# Patient Record
Sex: Male | Born: 1945 | Race: White | Hispanic: No | Marital: Married | Smoking: Former smoker
Health system: Southern US, Community
[De-identification: ages and names within clinical notes are randomized; demographics above are authoritative.]

## PROBLEM LIST (undated history)

## (undated) DIAGNOSIS — C3490 Malignant neoplasm of unspecified part of unspecified bronchus or lung: Secondary | ICD-10-CM

## (undated) DIAGNOSIS — Z923 Personal history of irradiation: Secondary | ICD-10-CM

## (undated) DIAGNOSIS — R7303 Prediabetes: Secondary | ICD-10-CM

## (undated) DIAGNOSIS — I10 Essential (primary) hypertension: Secondary | ICD-10-CM

## (undated) DIAGNOSIS — M1990 Unspecified osteoarthritis, unspecified site: Secondary | ICD-10-CM

## (undated) DIAGNOSIS — IMO0002 Reserved for concepts with insufficient information to code with codable children: Secondary | ICD-10-CM

## (undated) DIAGNOSIS — K219 Gastro-esophageal reflux disease without esophagitis: Secondary | ICD-10-CM

## (undated) DIAGNOSIS — M84350A Stress fracture, pelvis, initial encounter for fracture: Secondary | ICD-10-CM

## (undated) DIAGNOSIS — M549 Dorsalgia, unspecified: Secondary | ICD-10-CM

## (undated) DIAGNOSIS — G8929 Other chronic pain: Secondary | ICD-10-CM

## (undated) DIAGNOSIS — K8020 Calculus of gallbladder without cholecystitis without obstruction: Secondary | ICD-10-CM

## (undated) DIAGNOSIS — F419 Anxiety disorder, unspecified: Secondary | ICD-10-CM

## (undated) DIAGNOSIS — D649 Anemia, unspecified: Secondary | ICD-10-CM

## (undated) DIAGNOSIS — T7840XA Allergy, unspecified, initial encounter: Secondary | ICD-10-CM

## (undated) DIAGNOSIS — E7800 Pure hypercholesterolemia, unspecified: Secondary | ICD-10-CM

## (undated) DIAGNOSIS — Z87442 Personal history of urinary calculi: Secondary | ICD-10-CM

## (undated) DIAGNOSIS — R931 Abnormal findings on diagnostic imaging of heart and coronary circulation: Secondary | ICD-10-CM

## (undated) DIAGNOSIS — J439 Emphysema, unspecified: Secondary | ICD-10-CM

## (undated) HISTORY — DX: Emphysema, unspecified: J43.9

## (undated) HISTORY — DX: Malignant neoplasm of unspecified part of unspecified bronchus or lung: C34.90

## (undated) HISTORY — DX: Dorsalgia, unspecified: M54.9

## (undated) HISTORY — DX: Stress fracture, pelvis, initial encounter for fracture: M84.350A

## (undated) HISTORY — DX: Other chronic pain: G89.29

## (undated) HISTORY — DX: Personal history of irradiation: Z92.3

## (undated) HISTORY — DX: Pure hypercholesterolemia, unspecified: E78.00

## (undated) HISTORY — DX: Essential (primary) hypertension: I10

## (undated) HISTORY — DX: Reserved for concepts with insufficient information to code with codable children: IMO0002

## (undated) HISTORY — DX: Abnormal findings on diagnostic imaging of heart and coronary circulation: R93.1

---

## 2001-04-03 ENCOUNTER — Encounter: Payer: Self-pay | Admitting: Emergency Medicine

## 2001-04-03 ENCOUNTER — Emergency Department (HOSPITAL_COMMUNITY): Admission: EM | Admit: 2001-04-03 | Discharge: 2001-04-03 | Payer: Self-pay | Admitting: Emergency Medicine

## 2006-09-29 ENCOUNTER — Ambulatory Visit (HOSPITAL_COMMUNITY): Admission: RE | Admit: 2006-09-29 | Discharge: 2006-09-29 | Payer: Self-pay | Admitting: General Surgery

## 2008-06-20 ENCOUNTER — Ambulatory Visit (HOSPITAL_COMMUNITY): Admission: RE | Admit: 2008-06-20 | Discharge: 2008-06-20 | Payer: Self-pay | Admitting: Family Medicine

## 2008-08-09 ENCOUNTER — Ambulatory Visit (HOSPITAL_COMMUNITY): Admission: RE | Admit: 2008-08-09 | Discharge: 2008-08-09 | Payer: Self-pay | Admitting: Family Medicine

## 2008-08-17 ENCOUNTER — Ambulatory Visit (HOSPITAL_COMMUNITY): Admission: RE | Admit: 2008-08-17 | Discharge: 2008-08-17 | Payer: Self-pay | Admitting: Family Medicine

## 2008-08-23 ENCOUNTER — Ambulatory Visit (HOSPITAL_COMMUNITY): Admission: RE | Admit: 2008-08-23 | Discharge: 2008-08-23 | Payer: Self-pay | Admitting: Family Medicine

## 2008-08-30 ENCOUNTER — Ambulatory Visit: Payer: Self-pay | Admitting: Thoracic Surgery

## 2008-09-27 ENCOUNTER — Inpatient Hospital Stay (HOSPITAL_COMMUNITY): Admission: RE | Admit: 2008-09-27 | Discharge: 2008-10-03 | Payer: Self-pay | Admitting: Neurosurgery

## 2009-05-09 ENCOUNTER — Ambulatory Visit: Payer: Self-pay | Admitting: Thoracic Surgery

## 2009-05-09 ENCOUNTER — Encounter: Admission: RE | Admit: 2009-05-09 | Discharge: 2009-05-09 | Payer: Self-pay | Admitting: Thoracic Surgery

## 2009-09-08 HISTORY — PX: BACK SURGERY: SHX140

## 2009-11-14 ENCOUNTER — Encounter: Admission: RE | Admit: 2009-11-14 | Discharge: 2009-11-14 | Payer: Self-pay | Admitting: Thoracic Surgery

## 2009-11-14 ENCOUNTER — Ambulatory Visit: Payer: Self-pay | Admitting: Thoracic Surgery

## 2010-03-01 ENCOUNTER — Encounter: Admission: RE | Admit: 2010-03-01 | Discharge: 2010-03-01 | Payer: Self-pay | Admitting: Family Medicine

## 2010-09-08 HISTORY — PX: LUNG LOBECTOMY: SHX167

## 2010-09-29 ENCOUNTER — Encounter: Payer: Self-pay | Admitting: Thoracic Surgery

## 2010-09-29 ENCOUNTER — Encounter: Payer: Self-pay | Admitting: Family Medicine

## 2010-09-30 ENCOUNTER — Ambulatory Visit (HOSPITAL_COMMUNITY)
Admission: RE | Admit: 2010-09-30 | Discharge: 2010-09-30 | Payer: Self-pay | Source: Home / Self Care | Attending: Internal Medicine | Admitting: Internal Medicine

## 2010-09-30 ENCOUNTER — Ambulatory Visit (HOSPITAL_COMMUNITY)
Admission: RE | Admit: 2010-09-30 | Discharge: 2010-09-30 | Payer: Self-pay | Source: Home / Self Care | Attending: Family Medicine | Admitting: Family Medicine

## 2010-10-01 LAB — CREATININE, SERUM

## 2010-10-08 ENCOUNTER — Ambulatory Visit
Admission: RE | Admit: 2010-10-08 | Discharge: 2010-10-08 | Payer: Self-pay | Source: Home / Self Care | Attending: Thoracic Surgery | Admitting: Thoracic Surgery

## 2010-10-09 NOTE — Letter (Signed)
October 08, 2010  [name]. Sherwood Gambler, MD [address] [place], [place] [address]  Re:  Maurice Orozco, Maurice Orozco                 DOB:  07/05/1946  Dear Dr. Sherwood Gambler:  I saw the patient back today and unfortunately he did not come back as we scheduled for him a CT scan, but he got a CT scan recently that showed this lesion increased to 11 x 11 x 12 mm in length from the previous 7 x 7.  The PET scan showed the uptake value was 1.7, so this is probably a slow-growing cancer.  He is still smoking and I cautioned him to not smoke.  I think the best thing to do is go ahead and resect this and he will require a right upper lobectomy.  I will check pulmonary function tests on him prior to proceeding with the resection. We plan to do this on October 15, 2010, at [facility].  Sincerely,  Ines Bloomer, M.D. Electronically Signed  DPB/MEDQ  D:  10/08/2010  T:  10/09/2010  Job:  [id number]

## 2010-10-10 ENCOUNTER — Ambulatory Visit (HOSPITAL_COMMUNITY)
Admission: RE | Admit: 2010-10-10 | Discharge: 2010-10-10 | Disposition: A | Discharge status: Home / Self Care | Payer: BC Managed Care – PPO | Source: Ambulatory Visit | Attending: Thoracic Surgery | Admitting: Thoracic Surgery

## 2010-10-10 DIAGNOSIS — J984 Other disorders of lung: Secondary | ICD-10-CM | POA: Insufficient documentation

## 2010-10-11 ENCOUNTER — Encounter (HOSPITAL_COMMUNITY): Payer: BC Managed Care – PPO

## 2010-10-11 ENCOUNTER — Other Ambulatory Visit: Payer: Self-pay | Admitting: Thoracic Surgery

## 2010-10-11 ENCOUNTER — Ambulatory Visit (HOSPITAL_COMMUNITY)
Admission: RE | Admit: 2010-10-11 | Discharge: 2010-10-11 | Disposition: A | Discharge status: Home / Self Care | Payer: BC Managed Care – PPO | Source: Ambulatory Visit | Attending: Thoracic Surgery | Admitting: Thoracic Surgery

## 2010-10-11 DIAGNOSIS — J4489 Other specified chronic obstructive pulmonary disease: Secondary | ICD-10-CM | POA: Insufficient documentation

## 2010-10-11 DIAGNOSIS — Z01818 Encounter for other preprocedural examination: Secondary | ICD-10-CM | POA: Insufficient documentation

## 2010-10-11 DIAGNOSIS — R911 Solitary pulmonary nodule: Secondary | ICD-10-CM

## 2010-10-11 DIAGNOSIS — J984 Other disorders of lung: Secondary | ICD-10-CM | POA: Insufficient documentation

## 2010-10-11 DIAGNOSIS — J449 Chronic obstructive pulmonary disease, unspecified: Secondary | ICD-10-CM | POA: Insufficient documentation

## 2010-10-11 LAB — COMPREHENSIVE METABOLIC PANEL
AST: 18 U/L (ref 0–37)
Albumin: 4 g/dL (ref 3.5–5.2)
Alkaline Phosphatase: 62 U/L (ref 39–117)
Chloride: 99 mEq/L (ref 96–112)
Creatinine, Ser: 0.79 mg/dL (ref 0.4–1.5)
Potassium: 4.6 mEq/L (ref 3.5–5.1)
Total Bilirubin: 0.3 mg/dL (ref 0.3–1.2)
Total Protein: 7 g/dL (ref 6.0–8.3)

## 2010-10-11 LAB — CBC
HCT: 43.4 % (ref 39.0–52.0)
Hemoglobin: 14.8 g/dL (ref 13.0–17.0)
WBC: 7.9 10*3/uL (ref 4.0–10.5)

## 2010-10-11 LAB — PROTIME-INR
INR: 0.93 (ref 0.00–1.49)
Prothrombin Time: 12.7 seconds (ref 11.6–15.2)

## 2010-10-11 LAB — APTT

## 2010-10-15 ENCOUNTER — Inpatient Hospital Stay (HOSPITAL_COMMUNITY): Payer: BC Managed Care – PPO

## 2010-10-15 ENCOUNTER — Inpatient Hospital Stay (HOSPITAL_COMMUNITY)
Admission: RE | Admit: 2010-10-15 | Discharge: 2010-10-25 | DRG: 538 | Disposition: A | Discharge status: Home / Self Care | Payer: BC Managed Care – PPO | Source: Ambulatory Visit | Attending: Thoracic Surgery | Admitting: Thoracic Surgery

## 2010-10-15 ENCOUNTER — Other Ambulatory Visit: Payer: Self-pay | Admitting: Thoracic Surgery

## 2010-10-15 DIAGNOSIS — J4489 Other specified chronic obstructive pulmonary disease: Secondary | ICD-10-CM | POA: Diagnosis present

## 2010-10-15 DIAGNOSIS — C3410 Malignant neoplasm of upper lobe, unspecified bronchus or lung: Secondary | ICD-10-CM

## 2010-10-15 DIAGNOSIS — F17200 Nicotine dependence, unspecified, uncomplicated: Secondary | ICD-10-CM | POA: Diagnosis present

## 2010-10-15 DIAGNOSIS — D72829 Elevated white blood cell count, unspecified: Secondary | ICD-10-CM | POA: Diagnosis present

## 2010-10-15 DIAGNOSIS — J988 Other specified respiratory disorders: Secondary | ICD-10-CM | POA: Diagnosis not present

## 2010-10-15 DIAGNOSIS — Y9210 Unspecified residential institution as the place of occurrence of the external cause: Secondary | ICD-10-CM | POA: Diagnosis not present

## 2010-10-15 DIAGNOSIS — G8929 Other chronic pain: Secondary | ICD-10-CM | POA: Diagnosis present

## 2010-10-15 DIAGNOSIS — E7800 Pure hypercholesterolemia, unspecified: Secondary | ICD-10-CM | POA: Diagnosis present

## 2010-10-15 DIAGNOSIS — Y836 Removal of other organ (partial) (total) as the cause of abnormal reaction of the patient, or of later complication, without mention of misadventure at the time of the procedure: Secondary | ICD-10-CM | POA: Diagnosis not present

## 2010-10-15 DIAGNOSIS — J9382 Other air leak: Secondary | ICD-10-CM | POA: Diagnosis not present

## 2010-10-15 DIAGNOSIS — M51379 Other intervertebral disc degeneration, lumbosacral region without mention of lumbar back pain or lower extremity pain: Secondary | ICD-10-CM | POA: Diagnosis present

## 2010-10-15 DIAGNOSIS — M5137 Other intervertebral disc degeneration, lumbosacral region: Secondary | ICD-10-CM | POA: Diagnosis present

## 2010-10-15 DIAGNOSIS — J449 Chronic obstructive pulmonary disease, unspecified: Secondary | ICD-10-CM | POA: Diagnosis present

## 2010-10-15 DIAGNOSIS — IMO0002 Reserved for concepts with insufficient information to code with codable children: Secondary | ICD-10-CM | POA: Diagnosis not present

## 2010-10-15 DIAGNOSIS — J13 Pneumonia due to Streptococcus pneumoniae: Secondary | ICD-10-CM | POA: Diagnosis not present

## 2010-10-15 DIAGNOSIS — J9819 Other pulmonary collapse: Secondary | ICD-10-CM | POA: Diagnosis not present

## 2010-10-15 DIAGNOSIS — Y838 Other surgical procedures as the cause of abnormal reaction of the patient, or of later complication, without mention of misadventure at the time of the procedure: Secondary | ICD-10-CM | POA: Diagnosis not present

## 2010-10-15 DIAGNOSIS — I10 Essential (primary) hypertension: Secondary | ICD-10-CM | POA: Diagnosis present

## 2010-10-15 LAB — BLOOD GAS, ARTERIAL
Bicarbonate: 24.8 mEq/L — ABNORMAL HIGH (ref 20.0–24.0)
Patient temperature: 98.6
TCO2: 26 mmol/L (ref 0–100)
pH, Arterial: 7.417 (ref 7.350–7.450)
pO2, Arterial: 80.7 mmHg (ref 80.0–100.0)

## 2010-10-15 LAB — URINALYSIS, ROUTINE W REFLEX MICROSCOPIC
Hgb urine dipstick: NEGATIVE
Nitrite: NEGATIVE
Protein, ur: NEGATIVE mg/dL
Specific Gravity, Urine: 1.023 (ref 1.005–1.030)
Urobilinogen, UA: 0.2 mg/dL (ref 0.0–1.0)

## 2010-10-15 LAB — SURGICAL PCR SCREEN: MRSA, PCR: NEGATIVE

## 2010-10-16 ENCOUNTER — Inpatient Hospital Stay (HOSPITAL_COMMUNITY): Payer: BC Managed Care – PPO

## 2010-10-16 DIAGNOSIS — J441 Chronic obstructive pulmonary disease with (acute) exacerbation: Secondary | ICD-10-CM

## 2010-10-16 DIAGNOSIS — Z9889 Other specified postprocedural states: Secondary | ICD-10-CM

## 2010-10-16 DIAGNOSIS — J9383 Other pneumothorax: Secondary | ICD-10-CM

## 2010-10-16 DIAGNOSIS — J9819 Other pulmonary collapse: Secondary | ICD-10-CM

## 2010-10-16 LAB — CBC
MCV: 98.4 fL (ref 78.0–100.0)
Platelets: 276 10*3/uL (ref 150–400)
RBC: 3.84 MIL/uL — ABNORMAL LOW (ref 4.22–5.81)
RDW: 13.3 % (ref 11.5–15.5)
WBC: 16.2 10*3/uL — ABNORMAL HIGH (ref 4.0–10.5)

## 2010-10-16 LAB — BASIC METABOLIC PANEL
CO2: 26 mEq/L (ref 19–32)
Chloride: 99 mEq/L (ref 96–112)
Creatinine, Ser: 0.6 mg/dL (ref 0.4–1.5)
Sodium: 134 mEq/L — ABNORMAL LOW (ref 135–145)

## 2010-10-16 LAB — POCT I-STAT 3, ART BLOOD GAS (G3+)
Patient temperature: 98.9
TCO2: 29 mmol/L (ref 0–100)
pCO2 arterial: 43.5 mmHg (ref 35.0–45.0)
pH, Arterial: 7.42 (ref 7.350–7.450)

## 2010-10-17 ENCOUNTER — Inpatient Hospital Stay (HOSPITAL_COMMUNITY): Payer: BC Managed Care – PPO

## 2010-10-17 DIAGNOSIS — J9382 Other air leak: Secondary | ICD-10-CM

## 2010-10-17 LAB — CBC
HCT: 35.5 % — ABNORMAL LOW (ref 39.0–52.0)
Hemoglobin: 12.4 g/dL — ABNORMAL LOW (ref 13.0–17.0)
MCH: 33.8 pg (ref 26.0–34.0)
MCHC: 34.9 g/dL (ref 30.0–36.0)
RDW: 13.1 % (ref 11.5–15.5)

## 2010-10-17 LAB — COMPREHENSIVE METABOLIC PANEL
BUN: 4 mg/dL — ABNORMAL LOW (ref 6–23)
CO2: 27 mEq/L (ref 19–32)
Calcium: 8.8 mg/dL (ref 8.4–10.5)
Creatinine, Ser: 0.6 mg/dL (ref 0.4–1.5)
GFR calc non Af Amer: >60 mL/min (ref >60)
Glucose, Bld: 136 mg/dL — ABNORMAL HIGH (ref 70–99)

## 2010-10-17 LAB — GLUCOSE, CAPILLARY
Glucose-Capillary: 115 mg/dL — ABNORMAL HIGH (ref 70–99)
Glucose-Capillary: 163 mg/dL — ABNORMAL HIGH (ref 70–99)
Glucose-Capillary: 130 mg/dL — ABNORMAL HIGH (ref 70–99)

## 2010-10-18 ENCOUNTER — Inpatient Hospital Stay (HOSPITAL_COMMUNITY): Payer: BC Managed Care – PPO

## 2010-10-18 LAB — TYPE AND SCREEN
ABO/RH(D): A NEG
Unit division: 0
Unit division: 0

## 2010-10-18 LAB — POCT I-STAT 3, ART BLOOD GAS (G3+)
Acid-Base Excess: 1 mmol/L (ref 0.0–2.0)
O2 Saturation: 95 %

## 2010-10-18 LAB — CBC
MCH: 33 pg (ref 26.0–34.0)
MCHC: 34 g/dL (ref 30.0–36.0)
Platelets: 244 10*3/uL (ref 150–400)

## 2010-10-18 LAB — GLUCOSE, CAPILLARY
Glucose-Capillary: 130 mg/dL — ABNORMAL HIGH (ref 70–99)
Glucose-Capillary: 157 mg/dL — ABNORMAL HIGH (ref 70–99)

## 2010-10-18 LAB — BASIC METABOLIC PANEL
Calcium: 8.1 mg/dL — ABNORMAL LOW (ref 8.4–10.5)
Creatinine, Ser: 0.59 mg/dL (ref 0.4–1.5)
GFR calc Af Amer: >60 mL/min (ref >60)

## 2010-10-19 ENCOUNTER — Inpatient Hospital Stay (HOSPITAL_COMMUNITY): Payer: BC Managed Care – PPO

## 2010-10-19 LAB — COMPREHENSIVE METABOLIC PANEL
ALT: 37 U/L (ref 0–53)
AST: 44 U/L — ABNORMAL HIGH (ref 0–37)
Albumin: 2.3 g/dL — ABNORMAL LOW (ref 3.5–5.2)
CO2: 28 mEq/L (ref 19–32)
Chloride: 94 mEq/L — ABNORMAL LOW (ref 96–112)
GFR calc Af Amer: >60 mL/min (ref >60)
GFR calc non Af Amer: >60 mL/min (ref >60)
Potassium: 3.6 mEq/L (ref 3.5–5.1)
Sodium: 131 mEq/L — ABNORMAL LOW (ref 135–145)
Total Bilirubin: 1 mg/dL (ref 0.3–1.2)

## 2010-10-19 LAB — CBC
Hemoglobin: 11.2 g/dL — ABNORMAL LOW (ref 13.0–17.0)
RBC: 3.35 MIL/uL — ABNORMAL LOW (ref 4.22–5.81)
WBC: 19 10*3/uL — ABNORMAL HIGH (ref 4.0–10.5)

## 2010-10-19 LAB — GLUCOSE, CAPILLARY: Glucose-Capillary: 114 mg/dL — ABNORMAL HIGH (ref 70–99)

## 2010-10-20 ENCOUNTER — Inpatient Hospital Stay (HOSPITAL_COMMUNITY): Payer: BC Managed Care – PPO

## 2010-10-20 LAB — BASIC METABOLIC PANEL
CO2: 28 mEq/L (ref 19–32)
Calcium: 8.4 mg/dL (ref 8.4–10.5)
Creatinine, Ser: 0.52 mg/dL (ref 0.4–1.5)
Glucose, Bld: 111 mg/dL — ABNORMAL HIGH (ref 70–99)
Sodium: 132 mEq/L — ABNORMAL LOW (ref 135–145)

## 2010-10-20 LAB — URINE CULTURE
Colony Count: NO GROWTH
Culture: NO GROWTH

## 2010-10-20 LAB — TISSUE CULTURE: Gram Stain: NONE SEEN

## 2010-10-20 LAB — CBC
HCT: 31.6 % — ABNORMAL LOW (ref 39.0–52.0)
Hemoglobin: 10.9 g/dL — ABNORMAL LOW (ref 13.0–17.0)
MCH: 32.8 pg (ref 26.0–34.0)
MCHC: 34.5 g/dL (ref 30.0–36.0)

## 2010-10-20 LAB — GLUCOSE, CAPILLARY

## 2010-10-21 ENCOUNTER — Inpatient Hospital Stay (HOSPITAL_COMMUNITY): Payer: BC Managed Care – PPO

## 2010-10-21 DIAGNOSIS — J13 Pneumonia due to Streptococcus pneumoniae: Secondary | ICD-10-CM

## 2010-10-21 DIAGNOSIS — C3410 Malignant neoplasm of upper lobe, unspecified bronchus or lung: Secondary | ICD-10-CM

## 2010-10-21 LAB — CBC
HCT: 32.2 % — ABNORMAL LOW (ref 39.0–52.0)
Hemoglobin: 11.3 g/dL — ABNORMAL LOW (ref 13.0–17.0)
MCH: 33.6 pg (ref 26.0–34.0)
MCHC: 35.1 g/dL (ref 30.0–36.0)
MCV: 95.8 fL (ref 78.0–100.0)
Platelets: 380 10*3/uL (ref 150–400)
RBC: 3.36 MIL/uL — ABNORMAL LOW (ref 4.22–5.81)
RDW: 13.2 % (ref 11.5–15.5)
WBC: 16.7 10*3/uL — ABNORMAL HIGH (ref 4.0–10.5)

## 2010-10-21 LAB — CULTURE, RESPIRATORY W GRAM STAIN

## 2010-10-21 LAB — BASIC METABOLIC PANEL
CO2: 29 mEq/L (ref 19–32)
Calcium: 8.3 mg/dL — ABNORMAL LOW (ref 8.4–10.5)
Chloride: 94 mEq/L — ABNORMAL LOW (ref 96–112)
Glucose, Bld: 127 mg/dL — ABNORMAL HIGH (ref 70–99)
Potassium: 3.7 mEq/L (ref 3.5–5.1)
Sodium: 133 mEq/L — ABNORMAL LOW (ref 135–145)

## 2010-10-21 LAB — GLUCOSE, CAPILLARY
Glucose-Capillary: 119 mg/dL — ABNORMAL HIGH (ref 70–99)
Glucose-Capillary: 152 mg/dL — ABNORMAL HIGH (ref 70–99)
Glucose-Capillary: 116 mg/dL — ABNORMAL HIGH (ref 70–99)

## 2010-10-22 ENCOUNTER — Inpatient Hospital Stay (HOSPITAL_COMMUNITY): Payer: BC Managed Care – PPO

## 2010-10-22 LAB — GLUCOSE, CAPILLARY
Glucose-Capillary: 111 mg/dL — ABNORMAL HIGH (ref 70–99)
Glucose-Capillary: 123 mg/dL — ABNORMAL HIGH (ref 70–99)
Glucose-Capillary: 108 mg/dL — ABNORMAL HIGH (ref 70–99)
Glucose-Capillary: 116 mg/dL — ABNORMAL HIGH (ref 70–99)
Glucose-Capillary: 114 mg/dL — ABNORMAL HIGH (ref 70–99)

## 2010-10-22 LAB — COMPREHENSIVE METABOLIC PANEL
AST: 38 U/L — ABNORMAL HIGH (ref 0–37)
Albumin: 2.3 g/dL — ABNORMAL LOW (ref 3.5–5.2)
BUN: 11 mg/dL (ref 6–23)
Calcium: 8.3 mg/dL — ABNORMAL LOW (ref 8.4–10.5)
Creatinine, Ser: 0.56 mg/dL (ref 0.4–1.5)
GFR calc Af Amer: >60 mL/min (ref >60)
GFR calc non Af Amer: >60 mL/min (ref >60)

## 2010-10-22 LAB — CBC
MCH: 32.6 pg (ref 26.0–34.0)
MCHC: 34.1 g/dL (ref 30.0–36.0)
RDW: 13.5 % (ref 11.5–15.5)

## 2010-10-22 LAB — AMYLASE: Amylase: 24 U/L (ref 0–105)

## 2010-10-22 LAB — MRSA PCR SCREENING: MRSA by PCR: NEGATIVE

## 2010-10-23 ENCOUNTER — Inpatient Hospital Stay (HOSPITAL_COMMUNITY): Payer: BC Managed Care – PPO

## 2010-10-23 LAB — GLUCOSE, CAPILLARY
Glucose-Capillary: 121 mg/dL — ABNORMAL HIGH (ref 70–99)
Glucose-Capillary: 115 mg/dL — ABNORMAL HIGH (ref 70–99)

## 2010-10-23 LAB — CBC
MCH: 32.7 pg (ref 26.0–34.0)
MCHC: 33.7 g/dL (ref 30.0–36.0)
Platelets: 476 10*3/uL — ABNORMAL HIGH (ref 150–400)

## 2010-10-24 ENCOUNTER — Inpatient Hospital Stay (HOSPITAL_COMMUNITY): Payer: BC Managed Care – PPO

## 2010-10-24 LAB — GLUCOSE, CAPILLARY
Glucose-Capillary: 112 mg/dL — ABNORMAL HIGH (ref 70–99)
Glucose-Capillary: 115 mg/dL — ABNORMAL HIGH (ref 70–99)

## 2010-10-24 LAB — COMPREHENSIVE METABOLIC PANEL
ALT: 58 U/L — ABNORMAL HIGH (ref 0–53)
AST: 28 U/L (ref 0–37)
Albumin: 2.3 g/dL — ABNORMAL LOW (ref 3.5–5.2)
Calcium: 8.4 mg/dL (ref 8.4–10.5)
GFR calc Af Amer: >60 mL/min (ref >60)
Glucose, Bld: 104 mg/dL — ABNORMAL HIGH (ref 70–99)
Sodium: 134 mEq/L — ABNORMAL LOW (ref 135–145)
Total Protein: 5.1 g/dL — ABNORMAL LOW (ref 6.0–8.3)

## 2010-10-24 LAB — CBC
MCHC: 34 g/dL (ref 30.0–36.0)
Platelets: 502 10*3/uL — ABNORMAL HIGH (ref 150–400)
RDW: 13.9 % (ref 11.5–15.5)
WBC: 13.1 10*3/uL — ABNORMAL HIGH (ref 4.0–10.5)

## 2010-10-24 NOTE — Op Note (Signed)
NAMEJENNIE, BOLAR                 ACCOUNT NO.:  [id number]  MEDICAL RECORD NO.:  [id number]           PATIENT TYPE:  I  LOCATION:  2305                         FACILITY:  [facility]  PHYSICIAN:  Ines Bloomer, M.D. DATE OF BIRTH:  03/09/1946  DATE OF PROCEDURE:  10/15/2010 DATE OF DISCHARGE:                              OPERATIVE REPORT   PREOPERATIVE DIAGNOSIS:  Right upper lobe nodule.  POSTOPERATIVE DIAGNOSIS:  Non-small cell cancer, right upper lobe.  OPERATION PERFORMED:  Right video-assisted thoracoscopic surgery, right thoracotomy, right upper lobectomy, and node dissection.  SURGEON:  Ines Bloomer, MD  INDICATIONS:  This patient had a right upper lobe nodule that has slowly increased in size.  The PET scan had been low uptake done several months ago.  Because of the increase in size, he was brought to the operating room for resection.  It was in the middle of the right upper lobe and a needle biopsy was held which was also because of the patient's chronic obstructive pulmonary disease.  He had an FEV-1 of 1.47 and his FVC was greater than 2.  DESCRIPTION OF PROCEDURE:  After general anesthesia, he was turned into the right lateral thoracotomy position.  He was prepped and draped in the usual sterile manner.  Two trocar sites were made in the anterior and posterior axillary line at the seventh intercostal space.  Two trocars were inserted.  His lungs were hyperinflated, but no lesions could be seen on the lung.  We decided to go with the posterolateral thoracotomy dividing the latissimus, reflecting the serratus anteriorly, and once we were in there, we entered the fifth intercostal space.  A tube was placed in the interspace and we were able to palpate the lesion in the middle of the right upper lobe.  I had no option but to go and do a right upper lobectomy.  Dissection was started anteriorly dissecting out the superior pulmonary vein which was stapled and divided  with a Covidien stapler.  Then we dissected out several 10R nodes and stapled and divided with a Covidien and the apical posterior branch.  I returned inferiorly and first took down the inferior pulmonary ligament.  We then dissected out the posterior mediastinum and finally we were able to get down to the superior segment and then decided to go into the fissure. We went into the fissure and dissected down to the pulmonary artery and identified some lymph nodes, some were 11R lymph nodes which were dissected out.  We then divided the superior portion of the fissure with a Covidien stapler.  Attention was then turned to the minor fissure which we also divided with Covidien stapler and that the ascending branch was clipped with clips and ligated proximally with 0-silk and divided.  Finally, we stapled the bronchus with the TA-30 stapler and divided distally and frozen section has negative bronchial margins and it is non-small cell lung cancer.  Single On-Q was inserted in the usual fashion.  A #28 and #24 chest tube was inserted to the trocar sites.  A Marcaine block was done in the usual fashion.  A chest was closed in layers drilling through the sixth rib and passed around the fifth rib, #1 Vicryl in the muscle layer, 2-0 Vicryl in the subcutaneous tissue, and Dermabond for the skin.  He returned to the recovery room in stable condition.     Ines Bloomer, M.D.     DPB/MEDQ  D:  10/15/2010  T:  10/15/2010  Job:  [id number]  Electronically Signed by Jovita Gamma M.D. on 10/24/2010 03:40:47 PM

## 2010-10-24 NOTE — Op Note (Signed)
  [name], Maurice Orozco                 ACCOUNT NO.:  [id number]  MEDICAL RECORD NO.:  [id number]           PATIENT TYPE:  I  LOCATION:  2305                         FACILITY:  [facility]  PHYSICIAN:  Ines Bloomer, M.D. DATE OF BIRTH:  09/17/1945  DATE OF PROCEDURE: DATE OF DISCHARGE:                              OPERATIVE REPORT   PREOPERATIVE DIAGNOSIS:  Persistent air leak.  POSTOPERATIVE DIAGNOSIS:  Persistent air leak.  OPERATION PERFORMED:  Right thoracotomy and exploration.  This patient had a right upper lobectomy 2 days previously and in the recovery room he had developed a larger leak, but this had resolved over 24-48 hours until last night when he became confused and tried to pull his tubes out, and the air leak then developed to 7/7.  His chest x-ray showed that his lung was down and he was put back on suction.  We were able to re-expand his lung, but his leak continued at 4-5/7.  He was brought to the operating room for possible closure of the air leak, underwent general anesthesia, was turned to the right lateral thoracotomy position, was prepped and draped in usual sterile manner. Previous thoracotomy incision was opened, the sutures were removed.  We put a TPA and explored the lung after we had removed the chest tubes. There was no obvious area of tear in the lungs.  The lungs did hyper- expand as you would see from emphysema.  He also had a lot of secretions and so cultures were taken of his secretions.  He had been put on Maxipime and vancomycin because of an elevated white count.  We looked at the inferior pulmonary ligament, the right lower lobe, the fissure of the right middle lobe.  Right across near the staple line there was some area of irregularity there.  We decided to rather than putting stitches in this area we expanded lung under 40 cm of pressure under water, and all we could see was just a small-to-minimal air leak that was coming probably from the  fissure.  For this reason, rather than putting more sutures in his emphysematous lungs, we elected to try to seal this with CoSeal and then placed two chest tubes through the old trocar sites, and closed the chest with 4 pericostals, #1 Vicryl in the muscle layer, 2-0 Vicryl in the subcutaneous tissue, and Ethicon skin clips.  We then connected him to a Pleur-evac, and he still had a small air leak from 1- 3 at 20 cm of dissection.  We elected that was satisfactory.  He was taken back to the recovery room in serious condition.     Ines Bloomer, M.D.     DPB/MEDQ  D:  10/17/2010  T:  10/18/2010  Job:  [id number]  Electronically Signed by Jovita Gamma M.D. on 10/24/2010 03:44:49 PM

## 2010-10-24 NOTE — H&P (Signed)
  Maurice Orozco, Maurice Orozco                 ACCOUNT NO.:  [id number]  MEDICAL RECORD NO.:  [id number]           PATIENT TYPE:  LOCATION:                                 FACILITY:  PHYSICIAN:  Ines Bloomer, M.D. DATE OF BIRTH:  1946/05/19  DATE OF ADMISSION: DATE OF DISCHARGE:                             HISTORY & PHYSICAL   CHIEF COMPLAINT:  Right upper lobe nodule.  This patient has been followed for right upper lobe nodule who was last seen in March 2011 and because he was having some problems with his back did not come back for followup and then was sent back today and a CT scan done showed that the lesion has gone from 7.7 to 11 x 12 mm.  Previous uptake was 1.7, and this goes along with the a slow growing lesion.He can walked up several flights of stairs without any problems.  Had no hemoptysis, fever, chills or productive sputum.  MEDICATIONS:  Percocet 10-325, Neurontin, Vytorin, hydrochlorothiazide, meloxicam, Coreg, fluticasone He has chronic back pain as well as hypertension, hypercholesterolemia.  FAMILY HISTORY:  Noncontributory.  SOCIAL HISTORY:  He is married.  He smokes 1 pack per day. Does not take alcohol on regular basis.  REVIEW OF SYSTEMS: General:Weight is stable.  PULMONARY:  No hemoptysis.  See history of present illness.  GI:  No nausea, vomiting, constipation, or diarrhea.  GU:  No kidney disease dysuria, or frequency urination. VASCULAR:  No claudication, DVT, or TIA.  NEUROLOGIC:  No dizziness, headaches, blackout, or seizures.  MUSCULOSKELETAL:  No arthritis or joint pain.  PSYCHIATRIC:  No depression or nervousness.  EYE/ENT:  No changes in eyesight or hearing.  HEMATOLOGIC:  No problems with bleeding, clotting disorders, or anemia.  PHYSICAL EXAMINATION:  GENERAL:  He is a well-developed Caucasian male no acute distress. VITAL SIGNS:  Blood pressure 140/80, pulse 82, respirations 18, and sats 96%. HEAD:  Atraumatic. EYES:  Pupils are equal and  reactive to light and accommodation. Extraocular movements are normal. EARS:  Tympanic membranes were intact. NOSE:  There is no nasal septum deviation. THROAT:  Uvula is midline with no lesions. NECK:  Supple without thyromegaly.  There is supraclavicular or axillary adenopathy. CHEST:  Clear to auscultation and percussion. HEART:  Regular.  Sinus rhythm.  No murmurs. ABDOMEN:  Soft. EXTREMITIES:  Pulses are 2+.  There is no clubbing or edema. NEUROLOGIC:  He is oriented x3.  Sensory and motor intact.  Cranial nerves intact.  IMPRESSION: 1. Enlarging right upper lobe lesion. 2. Ongoing tobacco abuse. 3. Chronic back pain with narcotic use. 4. Hypertension. 5. Hyperlipidemia.  PLAN:  Right upper lobectomy.     Ines Bloomer, M.D.     DPB/MEDQ  D:  10/10/2010  T:  10/11/2010  Job:  [id number]  Electronically Signed by Jovita Gamma M.D. on 10/24/2010 03:44:45 PM

## 2010-10-25 ENCOUNTER — Inpatient Hospital Stay (HOSPITAL_COMMUNITY): Payer: BC Managed Care – PPO

## 2010-10-25 LAB — GLUCOSE, CAPILLARY

## 2010-10-29 ENCOUNTER — Ambulatory Visit (INDEPENDENT_AMBULATORY_CARE_PROVIDER_SITE_OTHER): Payer: Self-pay | Admitting: Thoracic Surgery

## 2010-10-29 ENCOUNTER — Other Ambulatory Visit: Payer: Self-pay | Admitting: Thoracic Surgery

## 2010-10-29 ENCOUNTER — Ambulatory Visit
Admission: RE | Admit: 2010-10-29 | Discharge: 2010-10-29 | Disposition: A | Discharge status: Home / Self Care | Payer: BC Managed Care – PPO | Source: Ambulatory Visit | Attending: Thoracic Surgery | Admitting: Thoracic Surgery

## 2010-10-29 DIAGNOSIS — C3410 Malignant neoplasm of upper lobe, unspecified bronchus or lung: Secondary | ICD-10-CM

## 2010-10-29 DIAGNOSIS — Z902 Acquired absence of lung [part of]: Secondary | ICD-10-CM

## 2010-10-30 NOTE — Assessment & Plan Note (Signed)
OFFICE VISIT  Maurice Orozco, Maurice Orozco DOB:  08/15/1946                                        October 29, 2010 CHART #:  [id number]  HISTORY:  The patient is a 65 year old white male who is status post right upper lobe lobectomy on October 15, 2010, for stage IA adenocarcinoma.  He requires re-exploration for a persistent air leak and that was done on October 17, 2010.  He was discharged in very stable condition on October 25, 2010.  He was seen on today's date and reports that he is feeling remarkably well.  He states that his appetite is excellent.  He is having no significant pulmonary symptoms.  In general, he is feeling much better than he felt as though he would.  Chest x-ray was obtained on today's date.  It reveals very stable appearance of the right chest with evidence of hydropneumothorax which is slowly improving.  PHYSICAL EXAMINATION:  Vital Signs:  Blood pressure is 137/82, pulse 82, respirations 20, oxygen saturation is 95% on room air.  General Appearance:  Well-developed adult male, in no abuse distress.  Pulmonary examination reveals clear breath sounds bilaterally.  Cardiac Examination:  Regular rate and rhythm.  Normal S1 and S2.  Incisions are healing well without evidence of infection and the chest tube sutures were removed on today's date without difficulty.  They are healing well also.  ASSESSMENT:  The patient is continuing to make steady progress following his surgery.  He will be referred to Dr. Mariel Sleet in [place] for Oncology opinion although it appears that he will not require any adjuvant therapies at this time.  We will see him again in the office in 3 weeks for another chest x-ray and p.r.n. if he has any interval changes.  We have instructed him that next week he can begin some driving.  He is encouraged to continue his pulmonary toilet.  He is encouraged to increase his ambulation as tolerated.  I did give him  a prescription for Ativan which he has been taking and has run out of it. I gave him 0.5 mg 1/2 tablet at bedtime p.r.n.  I did instruct him that he would have to get this next time from his primary physician, Dr. Loreta Ave.  Rowe Clack, P.A.-C.  Sherryll Burger  D:  10/29/2010  T:  10/30/2010  Job:  [id number]  cc:   Ines Bloomer, M.D. Oretha Milch, MD Ladona Horns. Mariel Sleet, MD

## 2010-11-06 NOTE — Consult Note (Signed)
Maurice Orozco, Maurice Orozco                 ACCOUNT NO.:  [id number]  MEDICAL RECORD NO.:  [id number]           PATIENT TYPE:  I  LOCATION:  2305                         FACILITY:  [facility]  PHYSICIAN:  Oretha Milch, MD      DATE OF BIRTH:  15-Oct-1945  DATE OF CONSULTATION:  10/16/2010 DATE OF DISCHARGE:                                CONSULTATION   REFERRING PHYSICIAN:  Ines Bloomer, MD  PRIMARY CARE PHYSICIAN:  Maurice Orozco. Sherwood Gambler, MD in [place].  REASON FOR CONSULTATION:  Postop lobectomy and COPD management.  HISTORY OF PRESENT ILLNESS:  Maurice Orozco is a 65 year old smoker with enlarging right upper lobe nodule measuring 11 x 11 x 12 mm, increased in size from the previous 7 x 7 cm with PET scan showing low-grade positivity with an SUV uptake of 1.7.  His preop lung function showed an FEV-1 of 1.47 and an FVC of greater than 2 L.  He underwent a right upper lobectomy.  Preop, he could walk 3-4 miles and used ProAir on an as-needed basis only.  He recently completed a 2-week course of Augmentin for sinusitis.  His postop chest x-ray from today shows volume loss and right hemothorax with rightward heart and mediastinum.  Two chest tubes in the right with small amount of pneumothorax and subcu emphysema along the right chest wall.  Right basilar airspace.  PAST MEDICAL HISTORY: 1. Hypertension. 2. Chronic back pain. 3. Hypercholesterolemia.  PAST SURGICAL HISTORY:  Back surgery by Dr. Jeral Fruit in January 2010.  CURRENT MEDICATIONS: 1. Fluticasone nasal spray. 2. ProAir 2 puffs b.i.d. p.r.n. 3. Alprazolam 0.5 mg b.i.d. 4. Hydrochlorothiazide 25 mg half tablet p.o. daily. 5. Simvastatin 20 mg p.o. daily. 6. Augmentin 875 mg p.o. b.i.d. completed few days ago. 7. Hydrocodone/acetaminophen q.4 h. as needed.  SOCIAL HISTORY:  He lives with his wife in [place].  Smokes about a pack per day.  Does not drink on a regular basis.  PHYSICAL EXAMINATION:  GENERAL:  Adult gentleman  sitting up in bed in no apparent respiratory distress. VITAL SIGNS:  Heart rate 78 per minute sinus on monitor, blood pressure 132/80, respirations 20 per minute, oxygen saturation 95% on room air. Reports pain in the right chest tube site. HEENT:  No pallor.  No icterus. NECK:  Supple.  No JVD.  No lymphadenopathy. CVS:  S1 and S2 normal. CHEST:  Bilateral rhonchi and coarse breath sounds with crackles on the right lower lobe. ABDOMEN:  Soft, nontender. NEUROLOGIC:  Nonfocal. EXTREMITIES:  No edema.  LABORATORY DATA:  Sodium 134, potassium 3.5, BUN and creatinine 4 and 1.6, bicarbonate 26, glucose 169, chloride 99, calcium 8.5.  WBC count 16.8, hemoglobin 13, platelets 276.  Arterial blood gas was 7.42/44/54/88%.  IMPRESSION: 1. Right upper lobe nodule status post lobectomy. 2. Chronic obstructive pulmonary disease. 3. Tobacco abuse. 4. Postop atelectasis and pain.  RECOMMENDATIONS: 1. We will introduce bronchodilators in the form of Atrovent nebs 4     times a day and note that Xopenex has already been started by Dr.     Edwyna Shell.  Pulmicort 0.5 mg b.i.d.  will be initiated.  Based on his     improvement, he may require a long-acting bronchodilator on     discharge. 2. Pulmonary hygiene.  Bronchopulmonary toilet will be encouraged for     incentive spirometry and getting him mobile. 3. Antibiotics per Dr. Edwyna Shell. 4. Pain control per Dr. Edwyna Shell. 5. Tobacco cessation was encouraged.  He does not feel the need for     smoking cessation aids at the present time.  We will be happy to follow up on his COPD after discharge and his immediate postoperative management is over.     Oretha Milch, MD     RVA/MEDQ  D:  10/16/2010  T:  10/17/2010  Job:  [id number]  cc:   Maurice Orozco. Sherwood Gambler, MD  Electronically Signed by Cyril Mourning MD on 11/06/2010 05:25:12 PM

## 2010-11-10 NOTE — Discharge Summary (Signed)
  NAMECONSTANTINOS, KREMPASKY                 ACCOUNT NO.:  [id number]  MEDICAL RECORD NO.:  [id number]           PATIENT TYPE:  I  LOCATION:  3303                         FACILITY:  [facility]  PHYSICIAN:  Ines Bloomer, M.D. DATE OF BIRTH:  August 31, 1946  DATE OF ADMISSION:  10/15/2010 DATE OF DISCHARGE:  10/25/2010                              DISCHARGE SUMMARY   ADDENDUM: Mr. Amores was seen on the morning of October 25, 2010, and appears to be quite stable and continues his improvement.  The pulmonologists feel as though he should be discharged on Advair 250/50 one puff twice daily in addition to his already delineated medication plan.  There are no other changes in regard to his discharge summary.     Rowe Clack, P.A.-C.   ______________________________ Ines Bloomer, M.D.    Sherryll Burger  D:  10/25/2010  T:  10/25/2010  Job:  [id number]  cc:   Oretha Milch, MD  Electronically Signed by Gershon Crane P.A.-C. on 11/05/2010 10:37:23 AM Electronically Signed by Jovita Gamma M.D. on 11/10/2010 04:28:15 PM

## 2010-11-10 NOTE — Discharge Summary (Signed)
[name], CARRIER                 ACCOUNT NO.:  [id number]  MEDICAL RECORD NO.:  [id number]           PATIENT TYPE:  I  LOCATION:  3303                         FACILITY:  [facility]  PHYSICIAN:  Ines Bloomer, M.D. DATE OF BIRTH:  1946-05-14  DATE OF ADMISSION:  10/15/2010 DATE OF DISCHARGE:  10/25/2010                              DISCHARGE SUMMARY   PRIMARY ADMITTING DIAGNOSIS:  Right upper lobe lung nodule.  ADDITIONAL/DISCHARGE DIAGNOSES: 1. 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 2. Persistent air leak, post lobectomy. 3. Chronic obstructive pulmonary disease. 4. Hypertension. 5. Hypercholesterolemia. 6. Chronic back pain. 7. Postoperative Streptococcus pneumoniae. 8. History of tobacco abuse, ongoing  PROCEDURES PERFORMED: 1. Right minithoracotomy with right upper lobectomy and node     dissection. 2. Right thoracotomy with exploration of right chest.  HISTORY:  The patient is a 66 year old male who initially presented to the [facility] office for evaluation of a right upper lobe lung lesion in December 2009.  This was found incidentally on a chest CT, which was performed for degenerative disk disease of L5 through S1.  The patient did have a history of tobacco abuse, and at the time, a PET scan was indeterminate with an SUV of 1.7.  It was elected to follow this nodule with serial CT scans and the patient did continue to follow up in our office until March 2011.  There was no change in the nodule up to that point and the patient was subsequently lost to followup.  He did return in January 2012 with a CT scan, which showed an increase in size in the lesion from 7.7-11 x 12 mm.  Because of the increase in size of the lesion and his ongoing tobacco abuse,  Dr. Edwyna Shell recommended proceeding with a VATS wedge resection versus lobectomy at this time.  He explained all risks, benefits and alternatives of surgery to the patient and the patient agreed to  proceed.  HOSPITAL COURSE:  [name] was admitted to [facility] on October 15, 2010, and was taken to the operating room where he underwent a right upper lobectomy by Dr. Edwyna Shell.  Please see previously dictated operative report for complete details of surgery.  He tolerated the procedure well and was transferred to the SICU in stable condition. Initially, postoperatively, he was noted to have a large air leak which improved with suction and observation in the ICU initially.  However, over the course the first 48 hours, he developed an enlarged pneumothorax on chest x-ray with a persistent air leak which enlarged on physical exam.  Because of this and his tenuous pulmonary status, it was felt that he should return to the operating room for reexploration and closure of the air leak.  He has returned to the operating room on October 17, 2010, for right chest exploration and closure of his air leak.  He tolerated the procedure well and was transferred back to the ICU in stable condition with only a small air leak noted. Intraoperatively, it was noted that he had significant secretions in the lung and cultures were obtained of this area in  light of a leukocytosis. Ultimately, these cultures grew out strep pneumo and he was continued on IV antibiotics, which were adjusted accordingly.  He remained stable and his white blood cell count began to trend downward.  He continued to have a small air leak, but this ultimately resolved.  By October 23, 2010, all chest tubes have been removed.  He has been treated with aggressive pulmonary toilet measures and currently is maintaining O2 sats of greater than 90% on room air.  He has been transferred to the step-down unit.  He is now completed a 7-day course of IV antibiotics and his pulmonary status is improved significantly.  He has had some GI issues postoperatively with some nausea and vomiting.  This was treated conservatively and  monitored closely.  At the present time, his GI status is back to baseline and he is tolerating a regular diet without problem.  Overall, he is progressing slowly and steadily.  He is ambulating in the halls without difficulty and is tolerating a regular diet.  His final pathology was positive for adenocarcinoma stage T1A,M0. His most recent labs show a sodium of 134, potassium 3.9, BUN 11, creatinine 0.66.  White count 13.1, hemoglobin 10.7, hematocrit 31.5, platelet count 502,000.  Amylase was normal at 24.  He had also been previously noted on labs to have some mild elevation of his transaminases, which peaked with an SGOT of 38 and SGPT of 64.  His most recent labs showed improvement on October 24, 2010, with an SGOT of 28 and SGPT at 58.  His most recent chest x-ray showed moderate loculated right hydropneumothorax and some atelectasis, but overall has remained stable.  He will be reevaluated on the morning of October 25, 2010, and will undergo a PA and lateral chest x-ray at that time as well as labs. It is felt that if he continues to remain stable and no acute changes occur, he will hopefully ready for discharge home at that time. Decision will be made regarding continuation of p.o. antibiotics at home at the time of his discharge.  DISCHARGE MEDICATIONS: 1. Vicodin 5/325 one to two q.4 h. p.r.n. for pain. 2. Protonix 40 mg b.i.d. 3. Florastor 250 mg two tablets b.i.d. 4. Alprazolam 0.5 mg b.i.d. 5. Fluticasone nasal spray two sprays daily. 6. Hydrochlorothiazide 12.5 mg daily. 7. Albuterol inhaler two puffs p.r.n. 8. Simvastatin 20 mg daily.  DISCHARGE INSTRUCTIONS:  He is asked to refrain from driving, heavy lifting or strenuous activity.  He may continue ambulating daily and using his incentive spirometer.  He may shower daily and clean his incisions with soap and water.  He will continue with same preoperative diet.  DISCHARGE FOLLOWUP:  He will see Dr. Edwyna Shell in the  office in 1 week with a chest x-ray.  In the interim, if he experiences any problems or questions he is asked to contact our office.     Coral Ceo, P.A.   ______________________________ Ines Bloomer, M.D.    GC/MEDQ  D:  10/24/2010  T:  10/24/2010  Job:  [id number]  cc:   [name]. Sherwood Gambler, MD  Electronically Signed by Weldon Inches. on 10/31/2010 10:02:11 AM Electronically Signed by Jovita Gamma M.D. on 11/10/2010 04:28:13 PM

## 2010-11-13 ENCOUNTER — Ambulatory Visit (HOSPITAL_COMMUNITY): Payer: BC Managed Care – PPO | Admitting: Oncology

## 2010-11-13 DIAGNOSIS — C3490 Malignant neoplasm of unspecified part of unspecified bronchus or lung: Secondary | ICD-10-CM

## 2010-11-18 ENCOUNTER — Inpatient Hospital Stay: Payer: BC Managed Care – PPO | Admitting: Pulmonary Disease

## 2010-11-18 ENCOUNTER — Other Ambulatory Visit: Payer: Self-pay | Admitting: Thoracic Surgery

## 2010-11-18 DIAGNOSIS — C3410 Malignant neoplasm of upper lobe, unspecified bronchus or lung: Secondary | ICD-10-CM

## 2010-11-19 ENCOUNTER — Ambulatory Visit
Admission: RE | Admit: 2010-11-19 | Discharge: 2010-11-19 | Disposition: A | Discharge status: Home / Self Care | Payer: BC Managed Care – PPO | Source: Ambulatory Visit | Attending: Thoracic Surgery | Admitting: Thoracic Surgery

## 2010-11-19 ENCOUNTER — Ambulatory Visit (INDEPENDENT_AMBULATORY_CARE_PROVIDER_SITE_OTHER): Payer: Self-pay | Admitting: Thoracic Surgery

## 2010-11-19 DIAGNOSIS — C3490 Malignant neoplasm of unspecified part of unspecified bronchus or lung: Secondary | ICD-10-CM

## 2010-11-19 DIAGNOSIS — C3410 Malignant neoplasm of upper lobe, unspecified bronchus or lung: Secondary | ICD-10-CM

## 2010-11-20 NOTE — Assessment & Plan Note (Unsigned)
OFFICE VISIT  SNYDER, COLAVITO DOB:  1945-10-12                                        November 19, 2010 CHART #:  [id number]  The patient is status post right upper lobectomy on October 15, 2010, for stage IA adenocarcinoma.  He required re-exploration for persistent air leak which was done on October 17, 2010.  He was last seen in the office on October 29, 2010, at which time was his first follow up visit following surgery.  He presents today for a 3-week follow up visit with a chest x-ray.  The patient continues to progress well.  He still has some mild shortness of breath when he over does it but overall feels pretty good.  His energy level is increasing.  He denies any fevers, nausea, vomiting, coughing, hemoptysis, or wheezing.  PHYSICAL EXAMINATION:  VITAL SIGNS:  Blood pressure 122/71, pulse 76, respirations of 14, and O2 saturations 95% on room air.  RESPIRATORY: Clear to auscultation bilaterally.  CARDIAC:  Regular rate and rhythm. SKIN:  Incisions all healing well.  STUDIES:  The patient had PA and lateral chest x-ray obtained today which shows some decrease in size of loculated right apical hydropneumothorax.  Otherwise, stable chest x-ray.  IMPRESSION AND PLAN:  The patient was seen and evaluated by Dr. Edwyna Shell. The patient is continuing to progress well postoperatively.  He has seen Dr. Mariel Sleet in the past and no further treatment is noted at this time.  He plans to have a follow up CT scan in 6 months with a follow up appointment to see him, Dr. Mariel Sleet.  Dr. Edwyna Shell told patient that we will follow up with him prior to this in 3 months with repeat chest x- ray for followup of his right hydropneumothorax.  The patient is to continue increasing his activities as tolerated.  He is told to contact us if he has any surgical concerns prior to his 6-month follow up visit.  The patient is in agreement.  Sol Blazing, PA  KMD/MEDQ  D:   11/19/2010  T:  11/20/2010  Job:  [id number]  cc:   Ladona Horns. Mariel Sleet, MD

## 2010-12-23 LAB — TYPE AND SCREEN
ABO/RH(D): A NEG
Antibody Screen: NEGATIVE

## 2010-12-23 LAB — BASIC METABOLIC PANEL
Calcium: 10 mg/dL (ref 8.4–10.5)
Chloride: 95 mEq/L — ABNORMAL LOW (ref 96–112)
Creatinine, Ser: 0.76 mg/dL (ref 0.4–1.5)
GFR calc Af Amer: >60 mL/min (ref >60)

## 2010-12-23 LAB — ABO/RH: ABO/RH(D): A NEG

## 2010-12-23 LAB — CBC
RBC: 4.63 MIL/uL (ref 4.22–5.81)
WBC: 7.7 10*3/uL (ref 4.0–10.5)

## 2011-01-21 NOTE — Op Note (Signed)
NAMEARSEN, MANGIONE                 ACCOUNT NO.:  [id number]   MEDICAL RECORD NO.:  [id number]          PATIENT TYPE:  INP   LOCATION:  3009                         FACILITY:  [facility]   PHYSICIAN:  Hilda Lias, M.D.   DATE OF BIRTH:  Dec 09, 1945   DATE OF PROCEDURE:  09/27/2008  DATE OF DISCHARGE:                               OPERATIVE REPORT   PREOPERATIVE DIAGNOSIS:  L5-S1 spondylolisthesis, grade 1 with chronic  radiculopathy.   POSTOPERATIVE DIAGNOSIS:  L5-S1 spondylolisthesis, grade 1 with chronic  radiculopathy.   PROCEDURES:  L5 Gill procedure, total bilateral diskectomy, interbody  fusion with cages 12 x 22 with autograft and BMP, pedicle screws,  posterolateral arthrodesis, cell saver and C-arm.   SURGEON:  Hilda Lias, MD   ASSISTANT:  Danae Orleans. Venetia Maxon, MD   CLINICAL HISTORY:  Mr. Knueppel is a 65 year old gentleman complaining of  back pain with radiation to both legs for many years.  The patient has a  grade 1 spondylolisthesis.  He came to see me last week and said that he  was getting worse, he wanted to proceed with surgery.  The surgery was  explained to him and the risks were explained in history and physical.   PROCEDURE IN DETAIL:  Mr. Monical was taken to the OR and after  intubation, he was positioned in prone manner.  The skin was cleaned  with DuraPrep.  Drapes were applied.  Midline incision from L4-5 to L5-  S1 was made and muscle was retracted all the way laterally until we were  able to see the joint between the transverse process of L5 and the  facet.  Indeed, the facet of L5-S1 was loose.  We proceeded with a Gill  procedure, removing the spinous process, the lamina, and the facet  bilaterally.  The yellow ligament was also excised.  We did a retraction  and we entered the disk space, which was quite narrow.  Nevertheless,  using microcurette, we were able to remove the disk space.  At the end  using a dilator, we were able to open up the disk.   Total diskectomy was  achieved.  The endplate was drilled.  Then, 2 cages of 12 x 22 with BMP  and autograft insert were inserted.  The rest of the disk was packed  with autograft.  Using the C-arm in AP view and lateral view later, the  pedicle of L5 and S1 were drilled.  We introduced 4 pedicle screws.  Prior to introduction, we were sure that all 4 quadrants of the wound  was surrounded by bone.  At the level of L5, we introduced 2 screws of  6.5 x 45 and at the level of S1, 6.5 x 55.  Good position was achieved.  Then, we went back again into the canal and we were sure that there was  no any compromise of the medial wall of the pedicle.  The nerves were  free and lysis was accomplished.  Then, the screws were connected with a  rope and capped to keep it in place.  We went laterally  and we drilled  the lateral facet of L5 and the ala of the sacrum and a mix of BMP and  autograft was used for arthrodesis.  After having good hemostasis,  fentanyl was left in the epidural space and the wound was closed with  Vicryl and Steri-Strips.           ______________________________  Hilda Lias, M.D.    EB/MEDQ  D:  09/27/2008  T:  09/28/2008  Job:  [id number]

## 2011-01-21 NOTE — Letter (Signed)
May 09, 2009   Patrica Duel, M.D.  [address]  [place], [place] [address]   Re:  DYMIR, NEESON                 DOB:  06-14-1946   Dear Loraine Leriche,   I saw the patient back in the office today.  We got a CT scan apparently  he was suppose to get a CT scan at 3 months but there was some confusion  and did not get, I appreciate you ordering the CT scan.  CT scan shows  no evidence of change in the right upper lobe 9-mm nodule which had an  SUV of 1.7 on the previous PET, I think we need to continue to watch  this.  I have scheduled another CT scan in 6 months, hope there will be  no problems for getting this.  His blood pressure is 140/82, pulse 80,  respirations 18, sats were 96%.  Lungs were clear to auscultation and  percussion.  I appreciate the opportunity of seeing the patient.   Sincerely,   Ines Bloomer, M.D.  Electronically Signed   DPB/MEDQ  D:  05/09/2009  T:  05/10/2009  Job:  [id number]

## 2011-01-21 NOTE — Letter (Signed)
November 14, 2009   Patrica Duel, MD  [address]  [place], [place] [address]   Re:  Maurice Orozco, Maurice Orozco                 DOB:  [birth year]   Dear Loraine Leriche,   The patient still continues to smoke, but he says he is trying to stop  smoking.  He had a CT scan done in September that showed the right upper  lobe nodule has not changed.  His repeat CT today also shows no changes  in his right upper lobe nodule.  His blood pressure was 137/77, pulse  76, respirations 18, and sats were 97%.  We will continue to follow him  over the next 6 months and we check another CT scan at that time.  They  actually measured the right upper lobe nodule is being 7 x 7 mm rather  than 9 mm as previously seen.  I appreciate the opportunity of seeing  the patient.   Sincerely,   Ines Bloomer, M.D.  Electronically Signed   DPB/MEDQ  D:  11/14/2009  T:  11/15/2009  Job:  [id number]

## 2011-01-21 NOTE — Letter (Signed)
August 30, 2008   Patrica Duel, MD  [address]  [place], [place] [address]   Re:  Maurice Orozco, Maurice Orozco                 DOB:  1945/12/21   Dear Loraine Leriche,   I appreciate the opportunity of seeing the patient.  This 65 year old  patient has a long history of smoking, has not had a chest x-ray for at  least 5 years and had a chest x-ray that showed a small nodule in the  right upper lobe.  CT scan confirmed that this is approximately 9-10 mm  in the right upper lobe.  A PET scan was indeterminate, it was an SUV of  1.7.  It would be inflammatory cancer, or a low-grade cancer.  He has  had no fever, chills, weight loss, no hemoptysis, does have wheezing,  smokes at least 1 pack of cigarettes a day.   PAST MEDICAL HISTORY:  Significant for hypertension and  hypercholesterolemia.  He also has severe back pain.   MEDICATIONS:  Norco 10/325, Neurontin, Vytorin 10/20 one a day,  hydrochlorothiazide 25 mg a day, and fish oil.   ALLERGIES:  He has no allergies.   FAMILY HISTORY:  Noncontributory.   SOCIAL HISTORY:  He is married.  He smokes one pack a day, does not  drink alcohol on regular basis.   REVIEW OF SYSTEMS:  Vital Signs:  His weight is 154 pounds and he is 5  feet 6 inches.  General:  His weight has been stable.  Cardiac:  No  angina or atrial fibrillation.  Pulmonary:  See history of present  illness.  GI:  No nausea, vomiting, constipation, or diarrhea.  GU:  No  kidney disease, dysuria, or frequent urination.  Vascular:  No  claudication, DVT, or TIAs.  Neurologic:  No dizziness, headaches,  blackouts, or seizures.  Musculoskeletal:  He has arthritis and joint  pain and muscular pain.  Psychiatric:  No depression or nervousness.  Eyes/ENT:  No changes in eyesight or hearing.  Hematologic:  No problems  with bleeding, clotting disorders, or anemia.   PHYSICAL EXAMINATION:  GENERAL:  He is a well-developed Caucasian male  with the smell of tobacco.  VITAL SIGNS:   His blood pressure is 144/80, pulse 83, respirations 18,  and sats were 94%.  HEAD, EYES, EARS, NOSE, AND THROAT:  Unremarkable.  NECK:  Supple without thyromegaly.  CHEST:  Clear to auscultation and percussion.  HEART:  Regular sinus rhythm.  No murmurs.  ABDOMEN:  Soft.  No hepatosplenomegaly.  Bowel sounds are normal.  EXTREMITIES:  Pulses are 2+.  There is no clubbing or edema.  NEUROLOGICAL:  He is oriented x3.  Sensory and motor intact.   I have reviewed the situation and unfortunately, this nodule is very  deep in the right upper lobe.  I plan to get a full set of pulmonary  function test on him and I have encouraged him to stop smoking.  I think  it could be safe to do a short-term followup on this and we will see  him, so we will get another CT scan on him in 3 months and if that is  still there or increased in size then I will recommend that he have a  right upper lobectomy.  A needle biopsy would be very difficult given  where this is located in the lung.  I explained this in detail to the  patient and  he agrees with this plan.  I appreciate the opportunity of  seeing the patient.   Ines Bloomer, M.D.  Electronically Signed   DPB/MEDQ  D:  08/30/2008  T:  08/30/2008  Job:  [id number]

## 2011-01-24 NOTE — Discharge Summary (Signed)
Maurice Orozco, Maurice Orozco                 ACCOUNT NO.:  [id number]   MEDICAL RECORD NO.:  [id number]          PATIENT TYPE:  INP   LOCATION:  3009                         FACILITY:  [facility]   PHYSICIAN:  Hilda Lias, M.D.   DATE OF BIRTH:  12/19/1945   DATE OF ADMISSION:  09/27/2008  DATE OF DISCHARGE:  10/01/2008                               DISCHARGE SUMMARY   ADMISSION DIAGNOSES:  Degenerative disk disease with spondylolisthesis  at the level of L5-S1.   FINAL DIAGNOSES:  Degenerative disk disease with spondylolisthesis at  the level of L5-S1.   CLINICAL HISTORY:  The patient has been complaining of back pain with  radiation to both legs for many years.  He had the diagnosis done by me  in the past of lumbar spondylolisthesis.  Finally, he felt that the pain  was getting worse and he wanted to proceed with surgery.   COURSE IN THE HOSPITAL:  The patient was taken to surgery and fusion at  the level of 5-1 was done.  The patient did well.  The patient was kept  in the hospital and he was discharged by Dr. Hewitt Shorts on  October 01, 2008.   CONDITION ON DISCHARGE:  Improved.   MEDICATIONS:  Percocet and diazepam.   DIET:  Regular.   ACTIVITY:  Not to drive until he sees me.   FOLLOWUP:  To be seen by me in my office 4 weeks.           ______________________________  Hilda Lias, M.D.     EB/MEDQ  D:  11/07/2008  T:  11/07/2008  Job:  [id number]

## 2011-01-24 NOTE — H&P (Signed)
Maurice Orozco, Maurice Orozco                 ACCOUNT NO.:  [id number]   MEDICAL RECORD NO.:  [id number]           PATIENT TYPE:  AMB   LOCATION:                                FACILITY:  [facility]   PHYSICIAN:  Dalia Heading, M.D.  DATE OF BIRTH:  1946-08-22   DATE OF ADMISSION:  DATE OF DISCHARGE:  LH                              HISTORY & PHYSICAL   CHIEF COMPLAINT:  Need for screening colonoscopy.   HISTORY OF PRESENT ILLNESS:  The patient is a 65 year old white male who  is referred for endoscopic evaluation.  Needs a colonoscopy for  screening purposes.  No abdominal pain, weight loss, nausea, vomiting,  diarrhea, constipation, melena, hematochezia have been noted.  He has  never had a colonoscopy.  There is no family history of colon carcinoma.   PAST MEDICAL HISTORY:  Includes hypertension.   PAST SURGICAL HISTORY:  Unremarkable.   CURRENT MEDICATIONS:  Hydrocodone as needed for pain,  hydrochlorothiazide, Vytorin.   ALLERGIES:  No known drug allergies.   REVIEW OF SYSTEMS:  The patient smokes intermittently.  He denies any  significant alcohol use.   PHYSICAL EXAMINATION:  GENERAL:  The patient is a well-developed, well-  nourished white male in no acute distress.  LUNGS:  Clear to auscultation with equal breath sounds bilaterally.  HEART:  Examination reveals regular rate and rhythm without history, S4,  or murmurs.  ABDOMEN:  Soft, nontender, nondistended.  No hepatosplenomegaly or  masses noted.  RECTAL:  Examination was deferred to the procedure.   IMPRESSION:  Need for screening colonoscopy.   PLAN:  The patient is scheduled for colonoscopy on September 29, 2006.  The risks and benefits of the procedure including bleeding and  perforation were fully explained to the patient, gave informed consent.      Dalia Heading, M.D.  Electronically Signed     MAJ/MEDQ  D:  09/22/2006  T:  09/22/2006  Job:  [id number]   cc:   [facility]  Fax: [phone]   Patrica Duel, M.D.  Fax: [phone]

## 2011-02-18 ENCOUNTER — Other Ambulatory Visit: Payer: Self-pay | Admitting: Thoracic Surgery

## 2011-02-18 DIAGNOSIS — C3410 Malignant neoplasm of upper lobe, unspecified bronchus or lung: Secondary | ICD-10-CM

## 2011-02-19 ENCOUNTER — Ambulatory Visit
Admission: RE | Admit: 2011-02-19 | Discharge: 2011-02-19 | Disposition: A | Discharge status: Home / Self Care | Payer: BC Managed Care – PPO | Source: Ambulatory Visit | Attending: Thoracic Surgery | Admitting: Thoracic Surgery

## 2011-02-19 ENCOUNTER — Ambulatory Visit (INDEPENDENT_AMBULATORY_CARE_PROVIDER_SITE_OTHER): Payer: BC Managed Care – PPO | Admitting: Thoracic Surgery

## 2011-02-19 DIAGNOSIS — C3490 Malignant neoplasm of unspecified part of unspecified bronchus or lung: Secondary | ICD-10-CM

## 2011-02-19 DIAGNOSIS — C3410 Malignant neoplasm of upper lobe, unspecified bronchus or lung: Secondary | ICD-10-CM

## 2011-02-20 NOTE — Assessment & Plan Note (Signed)
OFFICE VISIT  FRANCHOT, POLLITT DOB:  03-18-1946                                        February 19, 2011 CHART #:  [id number]  The patient returns for followup today.  He is now 3-1/2 months since his surgery which we did a right upper lower lobe segmental resection for a 1.6-cm adenocarcinoma.  His chest x-ray is stable showed normal postoperative changes.  Blood pressure is 158/78, pulse 80, respirations 18 and sats were 96%.  Overall, he is doing well, and I will see him back again in August after he gets his first CT scan.  Ines Bloomer, M.D. Electronically Signed  DPB/MEDQ  D:  02/19/2011  T:  02/20/2011  Job:  [id number]

## 2011-03-24 ENCOUNTER — Other Ambulatory Visit: Payer: Self-pay | Admitting: Thoracic Surgery

## 2011-03-24 DIAGNOSIS — C3490 Malignant neoplasm of unspecified part of unspecified bronchus or lung: Secondary | ICD-10-CM

## 2011-04-01 ENCOUNTER — Other Ambulatory Visit (HOSPITAL_COMMUNITY): Payer: Self-pay | Admitting: Oncology

## 2011-04-01 DIAGNOSIS — C3490 Malignant neoplasm of unspecified part of unspecified bronchus or lung: Secondary | ICD-10-CM

## 2011-04-23 ENCOUNTER — Ambulatory Visit: Payer: BC Managed Care – PPO | Admitting: Thoracic Surgery

## 2011-04-23 ENCOUNTER — Other Ambulatory Visit: Payer: BC Managed Care – PPO

## 2011-04-30 ENCOUNTER — Encounter (HOSPITAL_COMMUNITY): Payer: Self-pay

## 2011-04-30 ENCOUNTER — Encounter (HOSPITAL_COMMUNITY): Payer: BC Managed Care – PPO | Attending: Oncology

## 2011-04-30 ENCOUNTER — Ambulatory Visit (HOSPITAL_COMMUNITY)
Admission: RE | Admit: 2011-04-30 | Discharge: 2011-04-30 | Disposition: A | Discharge status: Home / Self Care | Payer: BC Managed Care – PPO | Source: Ambulatory Visit | Attending: Oncology | Admitting: Oncology

## 2011-04-30 DIAGNOSIS — I10 Essential (primary) hypertension: Secondary | ICD-10-CM | POA: Insufficient documentation

## 2011-04-30 DIAGNOSIS — C3490 Malignant neoplasm of unspecified part of unspecified bronchus or lung: Secondary | ICD-10-CM

## 2011-04-30 DIAGNOSIS — R0789 Other chest pain: Secondary | ICD-10-CM | POA: Insufficient documentation

## 2011-04-30 LAB — CBC
Hemoglobin: 14.2 g/dL (ref 13.0–17.0)
MCH: 33.1 pg (ref 26.0–34.0)
MCHC: 33.3 g/dL (ref 30.0–36.0)

## 2011-04-30 LAB — COMPREHENSIVE METABOLIC PANEL
ALT: 13 U/L (ref 0–53)
Calcium: 9.6 mg/dL (ref 8.4–10.5)
GFR calc Af Amer: >60 mL/min (ref >60)
Glucose, Bld: 103 mg/dL — ABNORMAL HIGH (ref 70–99)
Sodium: 138 mEq/L (ref 135–145)
Total Protein: 7.3 g/dL (ref 6.0–8.3)

## 2011-04-30 NOTE — Progress Notes (Signed)
Labs drawn today for [email or web address]

## 2011-05-02 ENCOUNTER — Ambulatory Visit (HOSPITAL_COMMUNITY): Payer: BC Managed Care – PPO | Admitting: Oncology

## 2011-05-06 ENCOUNTER — Encounter (HOSPITAL_COMMUNITY): Payer: Self-pay | Admitting: Oncology

## 2011-05-06 ENCOUNTER — Ambulatory Visit: Payer: BC Managed Care – PPO | Admitting: Thoracic Surgery

## 2011-05-06 ENCOUNTER — Encounter (HOSPITAL_BASED_OUTPATIENT_CLINIC_OR_DEPARTMENT_OTHER): Payer: BC Managed Care – PPO | Admitting: Oncology

## 2011-05-06 VITALS — BP 137/80 | HR 76 | Temp 98.0°F | Ht 64.0 in | Wt 153.2 lb

## 2011-05-06 DIAGNOSIS — E7800 Pure hypercholesterolemia, unspecified: Secondary | ICD-10-CM

## 2011-05-06 DIAGNOSIS — C3490 Malignant neoplasm of unspecified part of unspecified bronchus or lung: Secondary | ICD-10-CM

## 2011-05-06 DIAGNOSIS — J449 Chronic obstructive pulmonary disease, unspecified: Secondary | ICD-10-CM

## 2011-05-06 DIAGNOSIS — J439 Emphysema, unspecified: Secondary | ICD-10-CM

## 2011-05-06 DIAGNOSIS — IMO0002 Reserved for concepts with insufficient information to code with codable children: Secondary | ICD-10-CM

## 2011-05-06 DIAGNOSIS — I10 Essential (primary) hypertension: Secondary | ICD-10-CM

## 2011-05-06 DIAGNOSIS — C3410 Malignant neoplasm of upper lobe, unspecified bronchus or lung: Secondary | ICD-10-CM

## 2011-05-06 HISTORY — DX: Emphysema, unspecified: J43.9

## 2011-05-06 HISTORY — DX: Malignant neoplasm of unspecified part of unspecified bronchus or lung: C34.90

## 2011-05-06 HISTORY — DX: Pure hypercholesterolemia, unspecified: E78.00

## 2011-05-06 HISTORY — DX: Essential (primary) hypertension: I10

## 2011-05-06 NOTE — Progress Notes (Signed)
[name], MD [address] [place] [address]  1. Adenocarcinoma of lung  CBC, Differential, Comprehensive metabolic panel, CT Chest Wo Contrast  2. Back fracture      CURRENT THERAPY: S/P Right upper lobectomy  INTERVAL HISTORY: Maurice Orozco 65 y.o. 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  The patient reports right thorax pain secondary to the right lobectomy procedure.  I explained to the patient that this is likely secondary to the procedure.  I explained that this will likely improve over time.  He understands this.  If this discomfort does not improve, I would entertain the idea of initiating Gabapentin therapy.  Other than the aforementioned, the patient denies any complaints.  He is moving his bowels appropriately without any blood or black, tarry stool.  He denies any urinary complaints.  He denies hemoptysis, SOB, or increased cough.  He denies any fevers or chills.  He is pleased about his most recent lab work and CT scan.  His lab work does not show any abnormalities.  The CT scan reveals post-operative changes and does not display any worrisome findings or lesions.  Past Medical History  Diagnosis Date  . Cancer     rt lung/dx 2011/surg only  . Hypertension   . Lung cancer   . COPD (chronic obstructive pulmonary disease)   . High cholesterol   . Chronic back pain   . Pneumonia due to Streptococcus     HX. POST OPERATIVELY  . Back fracture     DUE TO AA IN 1970  . Stress fx pelvis     DUE TO AA IN 1970  . Adenocarcinoma of lung 05/06/2011    has Back fracture; Back fracture; and Adenocarcinoma of lung on his problem list.     is allergic to fentanyl and aspirin.  Maurice Orozco does not currently have medications on file.  Past Surgical History  Procedure Date  . Back surgery   . Lung lobectomy 2012    RT UPPER LOBE    Denies any headaches, dizziness,  double vision, fevers, chills, night sweats, nausea, vomiting, diarrhea, constipation, chest pain, heart palpitations, shortness of breath, blood in stool, black tarry stool, urinary pain, urinary burning, urinary frequency, hematuria.   PHYSICAL EXAMINATION  ECOG PERFORMANCE STATUS: 1 - Symptomatic but completely ambulatory  Filed Vitals:   05/06/11 1040  BP: 137/80  Pulse: 76  Temp: 98 F (36.7 C)    GENERAL:alert, healthy, no distress, well nourished, well developed, comfortable, cooperative and smiling SKIN: skin color, texture, turgor are normal, no rashes or significant lesions HEAD: Normocephalic EYES: normal EARS: External ears normal OROPHARYNX:mucous membranes are moist  NECK: supple, trachea midline LYMPH:  not examined BREAST:not examined LUNGS: clear to auscultation and percussion HEART: regular rate & rhythm, no murmurs, no gallops, S1 normal and S2 normal ABDOMEN:abdomen soft, non-tender and normal bowel sounds BACK: Back symmetric, no curvature., No CVA tenderness EXTREMITIES:less then 2 second capillary refill, no joint deformities, effusion, or inflammation, no edema, no skin discoloration, no clubbing, no cyanosis  NEURO: alert & oriented x 3 with fluent speech, no focal motor/sensory deficits, gait normal  LABORATORY DATA: CBC    Component Value Date/Time   WBC 8.6 04/30/2011 0907   RBC 4.29 04/30/2011 0907   HGB 14.2 04/30/2011 0907   HCT 42.6 04/30/2011 0907   PLT 278 04/30/2011 0907   MCV 99.3 04/30/2011  8413   MCH 33.1 04/30/2011 0907   MCHC 33.3 04/30/2011 0907   RDW 12.7 04/30/2011 0907      Chemistry      Component Value Date/Time   NA 138 04/30/2011 0907   K 3.6 04/30/2011 0907   CL 99 04/30/2011 0907   CO2 29 04/30/2011 0907   BUN 22 04/30/2011 0907   CREATININE 0.89 04/30/2011 0907      Component Value Date/Time   CALCIUM 9.6 04/30/2011 0907   ALKPHOS 73 04/30/2011 0907   AST 11 04/30/2011 0907   ALT 13 04/30/2011 0907   BILITOT 0.3 04/30/2011  0907     RADIOGRAPHIC STUDIES:  04/30/11  *RADIOLOGY REPORT*  Clinical Data: Lung cancer, 2011, treated with surgery only;  hypertension, chest pain, back pain  CT CHEST WITHOUT CONTRAST  Technique: Multidetector CT imaging of the chest was performed  following the standard protocol without IV contrast. Sagittal and  coronal MPR images reconstructed from axial data set.  Comparison: 09/30/2010  Findings:  Interval right upper lobectomy and resection of right upper lobe  nodule with volume loss and right thorax and slight mediastinal  shift to the right.  Scattered atherosclerotic calcifications aorta and coronary  arteries.  Scattered normal-sized mediastinal lymph nodes.  No definite thoracic adenopathy, right hilar assessment limited by  lack of contrast. Visualized portion upper abdomen unremarkable.  Emphysematous changes with minimal peribronchial thickening.  Tiny focus of probable scarring at left apex stable image 8.  Previously identified tiny right lower lobe pulmonary nodule no  longer seen.  No pulmonary mass, nodule, infiltrate or effusion.  In the mid right rib fracture image 31.  No acute osseous findings.  IMPRESSION:  Interval resection of right upper lobe pulmonary nodule.  Emphysematous, minimal bronchitic, and expected postsurgical  changes.  No evidence of recurrent tumor.  Tiny probable scar left apex.  Resolution of previously identified infiltrate.  Original Report Authenticated By: Lollie Marrow, M.D.   PATHOLOGY: 1. Lung, resection, right upper- Adenocarcinoma 1.6 cm, margins not involved, anthracotic lymph node.  No metastatic carcinoma identified. 0/4 lymph nodes for disease.    ASSESSMENT:  1. 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  S/P right upper lobe lobectomy on 10/15/10 by Dr. Edwyna Shell. 2. Right thorax discomfort secondary to lobectomy procedure   PLAN:  1. Lab work in 6 months: CBC diff, CMET 2. CT of chest without  contrast in 6 months 3. Return in 6 months for follow-up   All questions were answered. The patient knows to call the clinic with any problems, questions or concerns. We can certainly see the patient much sooner if necessary.  The patient and plan discussed with Glenford Peers, MD and he is in agreement with the aforementioned.  I spent 25 minutes counseling the patient face to face. The total time spent in the appointment was 40 minutes.  More than 50% of the time spent with the patient was utilized for counseling.   KEFALAS,THOMAS

## 2011-05-06 NOTE — Patient Instructions (Signed)
[facility]  Discharge Instructions  RECOMMENDATIONS MADE BY THE CONSULTANT AND ANY TEST RESULTS WILL BE SENT TO YOUR REFERRING DOCTOR.         SPECIAL INSTRUCTIONS/FOLLOW-UP:  We will see you back in 6 months for labs, CT scan and MD appt. We will call you with CT appt tomorrow but we will plan for it on Feb 25th.   I acknowledge that I have been informed and understand all the instructions given to me and received a copy. I do not have any more questions at this time, but understand that I may call the Specialty Clinic at [facility] at [phone] during business hours should I have any further questions or need assistance in obtaining follow-up care.    __________________________________________  _____________  __________ Signature of Patient or Authorized Representative            Date                   Time    __________________________________________ Nurse's Signature

## 2011-06-13 LAB — GLUCOSE, CAPILLARY: Glucose-Capillary: 107 mg/dL — ABNORMAL HIGH (ref 70–99)

## 2011-06-18 ENCOUNTER — Ambulatory Visit (INDEPENDENT_AMBULATORY_CARE_PROVIDER_SITE_OTHER): Payer: BC Managed Care – PPO | Admitting: Thoracic Surgery

## 2011-06-18 ENCOUNTER — Encounter: Payer: Self-pay | Admitting: Thoracic Surgery

## 2011-06-18 VITALS — BP 128/73 | HR 84 | Resp 18 | Ht 66.0 in | Wt 154.0 lb

## 2011-06-18 DIAGNOSIS — C3490 Malignant neoplasm of unspecified part of unspecified bronchus or lung: Secondary | ICD-10-CM

## 2011-06-18 NOTE — Progress Notes (Signed)
HPI the patient is mass 668 months a right upper lobectomy for stage IA adenocarcinoma. CT scan done in August 22 showed no evidence of recurrence of his cancer. He will be followed up in February by Dr. Laurie Panda with another CT scan his incisions are well-healed and he's doing well overall. I will release him for followup to Dr. Mariel Sleet.  Current Outpatient Prescriptions  Medication Sig Dispense Refill  . albuterol (PROVENTIL HFA;VENTOLIN HFA) 108 (90 BASE) MCG/ACT inhaler Inhale 2 puffs into the lungs every 6 (six) hours as needed.        . ALPRAZolam (XANAX) 0.5 MG tablet Take 0.5 mg by mouth at bedtime as needed.        . fluticasone (FLONASE) 50 MCG/ACT nasal spray Place 2 sprays into the nose daily.        . hydrochlorothiazide 25 MG tablet Take 25 mg by mouth daily. TAKES 1/2 TABLET DAILY       . HYDROcodone-acetaminophen (NORCO) 10-325 MG per tablet Take 1 tablet by mouth every 6 (six) hours as needed. TAKES 1 - 2 EVERY 4 HOURS FOR BACK PAIN       . simvastatin (ZOCOR) 20 MG tablet Take 20 mg by mouth at bedtime.           Review of Systems: No change Physical Exam  Cardiovascular: Normal rate, regular rhythm and normal heart sounds.   Pulmonary/Chest: Effort normal and breath sounds normal. No respiratory distress.     Diagnostic Tests: CT scan in August showed no recurrence of cancer.   Impression: Stage IA adenocarcinoma right upper lobe status post resection   Plan: Followup by medical oncologist

## 2011-10-21 DIAGNOSIS — G8929 Other chronic pain: Secondary | ICD-10-CM | POA: Diagnosis not present

## 2011-10-21 DIAGNOSIS — Z6826 Body mass index (BMI) 26.0-26.9, adult: Secondary | ICD-10-CM | POA: Diagnosis not present

## 2011-10-21 DIAGNOSIS — E785 Hyperlipidemia, unspecified: Secondary | ICD-10-CM | POA: Diagnosis not present

## 2011-10-21 DIAGNOSIS — I10 Essential (primary) hypertension: Secondary | ICD-10-CM | POA: Diagnosis not present

## 2011-10-31 ENCOUNTER — Encounter (HOSPITAL_COMMUNITY): Payer: BC Managed Care – PPO | Attending: Oncology

## 2011-10-31 DIAGNOSIS — C3410 Malignant neoplasm of upper lobe, unspecified bronchus or lung: Secondary | ICD-10-CM | POA: Diagnosis not present

## 2011-10-31 DIAGNOSIS — C3490 Malignant neoplasm of unspecified part of unspecified bronchus or lung: Secondary | ICD-10-CM

## 2011-10-31 LAB — COMPREHENSIVE METABOLIC PANEL
ALT: 15 U/L (ref 0–53)
Alkaline Phosphatase: 65 U/L (ref 39–117)
BUN: 20 mg/dL (ref 6–23)
CO2: 29 mEq/L (ref 19–32)
Chloride: 95 mEq/L — ABNORMAL LOW (ref 96–112)
GFR calc Af Amer: >90 mL/min (ref >90)
Glucose, Bld: 104 mg/dL — ABNORMAL HIGH (ref 70–99)
Potassium: 3.8 mEq/L (ref 3.5–5.1)
Total Bilirubin: 0.2 mg/dL — ABNORMAL LOW (ref 0.3–1.2)

## 2011-10-31 LAB — CBC
Hemoglobin: 13.5 g/dL (ref 13.0–17.0)
MCH: 33.1 pg (ref 26.0–34.0)
RBC: 4.08 MIL/uL — ABNORMAL LOW (ref 4.22–5.81)
WBC: 6.8 10*3/uL (ref 4.0–10.5)

## 2011-10-31 LAB — DIFFERENTIAL
Lymphocytes Relative: 24 % (ref 12–46)
Lymphs Abs: 1.6 10*3/uL (ref 0.7–4.0)
Monocytes Relative: 8 % (ref 3–12)
Neutro Abs: 4.3 10*3/uL (ref 1.7–7.7)
Neutrophils Relative %: 64 % (ref 43–77)

## 2011-10-31 NOTE — Progress Notes (Signed)
Maurice Handler Bonzo's reason for visit today is for labs as scheduled per MD orders.  Venipuncture performed with a 23 gauge butterfly needle to R Antecubital.  Maurice Orozco tolerated all procedures well and without incident; questions were answered and patient was discharged.

## 2011-11-03 ENCOUNTER — Ambulatory Visit (HOSPITAL_COMMUNITY)
Admission: RE | Admit: 2011-11-03 | Discharge: 2011-11-03 | Disposition: A | Discharge status: Home / Self Care | Payer: BC Managed Care – PPO | Source: Ambulatory Visit | Attending: Oncology | Admitting: Oncology

## 2011-11-03 DIAGNOSIS — C3490 Malignant neoplasm of unspecified part of unspecified bronchus or lung: Secondary | ICD-10-CM | POA: Diagnosis not present

## 2011-11-03 DIAGNOSIS — J984 Other disorders of lung: Secondary | ICD-10-CM | POA: Diagnosis not present

## 2011-11-04 ENCOUNTER — Encounter (HOSPITAL_BASED_OUTPATIENT_CLINIC_OR_DEPARTMENT_OTHER): Payer: BC Managed Care – PPO | Admitting: Oncology

## 2011-11-04 VITALS — BP 153/66 | HR 86 | Temp 98.3°F | Wt 162.1 lb

## 2011-11-04 DIAGNOSIS — C3410 Malignant neoplasm of upper lobe, unspecified bronchus or lung: Secondary | ICD-10-CM

## 2011-11-04 DIAGNOSIS — C3490 Malignant neoplasm of unspecified part of unspecified bronchus or lung: Secondary | ICD-10-CM

## 2011-11-04 NOTE — Progress Notes (Signed)
[name], MD, MD [address] [place] [address]  1. Adenocarcinoma of lung  CBC, Differential, Comprehensive metabolic panel, CT Chest Wo Contrast    CURRENT THERAPY:S/P Right upper lobectomy on 10/15/2010 by Dr. Edwyna Shell with negative margins   INTERVAL HISTORY: Maurice Orozco 66 y.o. 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 Primary tumor 1.6 cm in size 0/4 positive lymph nodes.  Grade 2 cancer. No LVI.   I personally reviewed and went over laboratory results with the patient.  The patient's lab work was unremarkable, but we discussed all aspects of hs CBC and CMET.  I personally reviewed and went over radiographic studies with the patient.  His most recent CT of chest on 11/03/2011 was negative for any recurrence or metastatic disease.  The patient saw Dr. Edwyna Shell on 06/18/2011 and he was pleased with the patient's progress.  His surgical wounds have healed well.  He was therefore released from Dr. Scheryl Darter care for regular follow-up at the [facility].   ROS: No TIA's or unusual headaches, no dysphagia.  No prolonged cough. No dyspnea or chest pain on exertion.  No abdominal pain, change in bowel habits, black or bloody stools.  No urinary tract symptoms.  No new or unusual musculoskeletal symptoms.    We discussed the patient's future surveillance which will include every 6 months CT of chest without contrast x 2 years from surgery (10/2010) followed by 3 years of yearly CT of chest without contrast.  The patient reports that he is doing some weightlifting and minimally sore from his work out yesterday.  Past Medical History  Diagnosis Date  . Cancer     rt lung/dx 2011/surg only  . Hypertension   . Lung cancer   . COPD (chronic obstructive pulmonary disease)   . High cholesterol   . Chronic back pain   . Pneumonia due to Streptococcus     HX. POST OPERATIVELY  .  Back fracture     DUE TO AA IN 1970  . Stress fx pelvis     DUE TO AA IN 1970  . Adenocarcinoma of lung 05/06/2011  . Hypertension 05/06/2011  . COPD (chronic obstructive pulmonary disease) 05/06/2011  . Emphysema 05/06/2011  . Hypercholesterolemia 05/06/2011    has Back fracture; Back fracture; Adenocarcinoma of lung; Hypertension; COPD (chronic obstructive pulmonary disease); Emphysema; and Hypercholesterolemia on his problem list.     is allergic to fentanyl and aspirin.  [name] does not currently have medications on file.  Past Surgical History  Procedure Date  . Back surgery   . Lung lobectomy 2012    RT UPPER LOBE    Denies any headaches, dizziness, double vision, fevers, chills, night sweats, nausea, vomiting, diarrhea, constipation, chest pain, heart palpitations, shortness of breath, blood in stool, black tarry stool, urinary pain, urinary burning, urinary frequency, hematuria.   PHYSICAL EXAMINATION  ECOG PERFORMANCE STATUS: 0 - Asymptomatic  Filed Vitals:   11/04/11 0909  BP: 153/66  Pulse: 86  Temp: 98.3 F (36.8 C)    GENERAL:alert, healthy, no distress, well nourished, well developed, comfortable, cooperative and smiling SKIN: skin color, texture, turgor are normal, no rashes or significant lesions HEAD: Normocephalic, No masses, lesions, tenderness or abnormalities EYES: normal EARS: External ears normal OROPHARYNX:mucous membranes are moist  NECK: supple, trachea midline LYMPH:  no palpable lymphadenopathy, no hepatosplenomegaly BREAST:not examined LUNGS: clear to auscultation  and percussion HEART: regular rate & rhythm, no murmurs, no gallops, S1 normal and S2 normal ABDOMEN:abdomen soft, non-tender, normal bowel sounds, no masses or organomegaly and no hepatosplenomegaly BACK: Back symmetric, no curvature., No CVA tenderness EXTREMITIES:less then 2 second capillary refill, no joint deformities, effusion, or inflammation, no edema, no skin  discoloration, no clubbing, no cyanosis  NEURO: alert & oriented x 3 with fluent speech, no focal motor/sensory deficits, gait normal   LABORATORY DATA: CBC    Component Value Date/Time   WBC 6.8 10/31/2011 1039   RBC 4.08* 10/31/2011 1039   HGB 13.5 10/31/2011 1039   HCT 40.0 10/31/2011 1039   PLT 249 10/31/2011 1039   MCV 98.0 10/31/2011 1039   MCH 33.1 10/31/2011 1039   MCHC 33.8 10/31/2011 1039   RDW 12.4 10/31/2011 1039   LYMPHSABS 1.6 10/31/2011 1039   MONOABS 0.6 10/31/2011 1039   EOSABS 0.2 10/31/2011 1039   BASOSABS 0.1 10/31/2011 1039      Chemistry      Component Value Date/Time   NA 134* 10/31/2011 1039   K 3.8 10/31/2011 1039   CL 95* 10/31/2011 1039   CO2 29 10/31/2011 1039   BUN 20 10/31/2011 1039   CREATININE 0.73 10/31/2011 1039      Component Value Date/Time   CALCIUM 9.3 10/31/2011 1039   ALKPHOS 65 10/31/2011 1039   AST 13 10/31/2011 1039   ALT 15 10/31/2011 1039   BILITOT 0.2* 10/31/2011 1039      RADIOGRAPHIC STUDIES:  Ct Chest Wo Contrast  11/03/2011  *RADIOLOGY REPORT*  Clinical Data: Lung cancer.  CT CHEST WITHOUT CONTRAST  Technique:  Multidetector CT imaging of the chest was performed following the standard protocol without IV contrast.  Comparison: 04/30/2011.  Findings: No pathologically enlarged mediastinal or axillary lymph nodes.  Hilar regions are difficult to definitively evaluate without IV contrast.  Coronary artery calcification.  Heart size normal.  No pericardial effusion.  Mild biapical pleural parenchymal scarring.  Postoperative changes of right upper lobectomy.  Centrilobular emphysema.  No pleural fluid.  Airway is otherwise unremarkable.  Incidental imaging of the upper abdomen shows no acute findings. Gastrohepatic ligament lymph nodes are small in size.  No worrisome lytic or sclerotic lesions.  IMPRESSION: Postoperative changes of right upper lobectomy without evidence of recurrent or metastatic disease.  Original Report Authenticated By: Reyes Ivan, M.D.     PATHOLOGY: 1. Lung, resection, right upper- Adenocarcinoma 1.6 cm, margins not involved, anthracotic lymph node. No metastatic carcinoma identified. 0/4 lymph nodes for disease.    ASSESSMENT: 1. 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 S/P right upper lobe lobectomy on 10/15/10 by Dr. Edwyna Shell.  2. Right thorax discomfort secondary to lobectomy procedure, improved   PLAN:  1. I personally reviewed and went over laboratory results with the patient. 2. I personally reviewed and went over radiographic studies with the patient. 3. We discussed the patient's future surveillance which will include every 6 month CT of chest without contrast x 2 years from diagnosis (10/2010) and then yearly CT of chest without contrast x 3 years for a total of 5 years of surveillance.  We will also perform lab work prior to each CT scan.  4. Lab work in 6 months: CBC diff, CMET 5. CT scan of chest without contrast in 6 months.  He will then have another CT scan in Feb 2014.  Then yearly x 3 years. 6. Return in 6 months following lab work and  CT scan.   All questions were answered. The patient knows to call the clinic with any problems, questions or concerns. We can certainly see the patient much sooner if necessary.  The patient and plan discussed with Glenford Peers, MD and he is in agreement with the aforementioned.   Naethan Bracewell

## 2011-11-04 NOTE — Patient Instructions (Signed)
[facility]  Discharge Instructions  RECOMMENDATIONS MADE BY THE CONSULTANT AND ANY TEST RESULTS WILL BE SENT TO YOUR REFERRING DOCTOR.   CT scan is stable, nothing [place] Daywalt to note. We will do lab work again in 6 months, repeat your CT scan after lab work and then have you come back and see the doctor. Report any issues or concerns to this clinic as needed.   I acknowledge that I have been informed and understand all the instructions given to me and received a copy. I do not have any more questions at this time, but understand that I may call the Specialty Clinic at [facility] at [phone] during business hours should I have any further questions or need assistance in obtaining follow-up care.    __________________________________________  _____________  __________ Signature of Patient or Authorized Representative            Date                   Time    __________________________________________ Nurse's Signature

## 2012-01-07 DIAGNOSIS — G8929 Other chronic pain: Secondary | ICD-10-CM | POA: Diagnosis not present

## 2012-01-07 DIAGNOSIS — L03319 Cellulitis of trunk, unspecified: Secondary | ICD-10-CM | POA: Diagnosis not present

## 2012-01-07 DIAGNOSIS — L02219 Cutaneous abscess of trunk, unspecified: Secondary | ICD-10-CM | POA: Diagnosis not present

## 2012-01-07 DIAGNOSIS — K219 Gastro-esophageal reflux disease without esophagitis: Secondary | ICD-10-CM | POA: Diagnosis not present

## 2012-01-07 DIAGNOSIS — Z6826 Body mass index (BMI) 26.0-26.9, adult: Secondary | ICD-10-CM | POA: Diagnosis not present

## 2012-05-03 ENCOUNTER — Encounter (HOSPITAL_COMMUNITY): Payer: BC Managed Care – PPO | Attending: Oncology

## 2012-05-03 ENCOUNTER — Ambulatory Visit (HOSPITAL_COMMUNITY)
Admission: RE | Admit: 2012-05-03 | Discharge: 2012-05-03 | Disposition: A | Discharge status: Home / Self Care | Payer: BC Managed Care – PPO | Source: Ambulatory Visit | Attending: Oncology | Admitting: Oncology

## 2012-05-03 DIAGNOSIS — C3410 Malignant neoplasm of upper lobe, unspecified bronchus or lung: Secondary | ICD-10-CM | POA: Diagnosis not present

## 2012-05-03 DIAGNOSIS — J449 Chronic obstructive pulmonary disease, unspecified: Secondary | ICD-10-CM | POA: Diagnosis not present

## 2012-05-03 DIAGNOSIS — Z902 Acquired absence of lung [part of]: Secondary | ICD-10-CM | POA: Diagnosis not present

## 2012-05-03 DIAGNOSIS — C3490 Malignant neoplasm of unspecified part of unspecified bronchus or lung: Secondary | ICD-10-CM

## 2012-05-03 DIAGNOSIS — Z09 Encounter for follow-up examination after completed treatment for conditions other than malignant neoplasm: Secondary | ICD-10-CM | POA: Diagnosis not present

## 2012-05-03 DIAGNOSIS — F411 Generalized anxiety disorder: Secondary | ICD-10-CM | POA: Insufficient documentation

## 2012-05-03 DIAGNOSIS — E876 Hypokalemia: Secondary | ICD-10-CM | POA: Diagnosis not present

## 2012-05-03 DIAGNOSIS — J4489 Other specified chronic obstructive pulmonary disease: Secondary | ICD-10-CM | POA: Diagnosis not present

## 2012-05-03 DIAGNOSIS — Z85118 Personal history of other malignant neoplasm of bronchus and lung: Secondary | ICD-10-CM | POA: Diagnosis not present

## 2012-05-03 DIAGNOSIS — L280 Lichen simplex chronicus: Secondary | ICD-10-CM | POA: Diagnosis not present

## 2012-05-03 LAB — COMPREHENSIVE METABOLIC PANEL
AST: 14 U/L (ref 0–37)
Albumin: 4 g/dL (ref 3.5–5.2)
Chloride: 101 mEq/L (ref 96–112)
Creatinine, Ser: 1.02 mg/dL (ref 0.50–1.35)
Potassium: 3.4 mEq/L — ABNORMAL LOW (ref 3.5–5.1)
Total Bilirubin: 0.3 mg/dL (ref 0.3–1.2)
Total Protein: 7 g/dL (ref 6.0–8.3)

## 2012-05-03 LAB — CBC
MCHC: 33.6 g/dL (ref 30.0–36.0)
MCV: 97.4 fL (ref 78.0–100.0)
Platelets: 252 10*3/uL (ref 150–400)
RDW: 12.5 % (ref 11.5–15.5)
WBC: 6.6 10*3/uL (ref 4.0–10.5)

## 2012-05-03 LAB — DIFFERENTIAL
Basophils Relative: 1 % (ref 0–1)
Lymphs Abs: 1.8 10*3/uL (ref 0.7–4.0)
Monocytes Absolute: 0.5 10*3/uL (ref 0.1–1.0)
Monocytes Relative: 7 % (ref 3–12)
Neutro Abs: 4.1 10*3/uL (ref 1.7–7.7)

## 2012-05-03 NOTE — Progress Notes (Signed)
Labs drawn today for cbc/diff,cmp 

## 2012-05-05 ENCOUNTER — Encounter (HOSPITAL_COMMUNITY): Payer: Self-pay | Admitting: Oncology

## 2012-05-05 ENCOUNTER — Encounter (HOSPITAL_BASED_OUTPATIENT_CLINIC_OR_DEPARTMENT_OTHER): Payer: BC Managed Care – PPO | Admitting: Oncology

## 2012-05-05 ENCOUNTER — Ambulatory Visit (HOSPITAL_COMMUNITY): Payer: BC Managed Care – PPO | Admitting: Oncology

## 2012-05-05 VITALS — BP 152/76 | HR 80 | Temp 98.1°F | Resp 20 | Wt 163.2 lb

## 2012-05-05 DIAGNOSIS — F17200 Nicotine dependence, unspecified, uncomplicated: Secondary | ICD-10-CM

## 2012-05-05 DIAGNOSIS — C3490 Malignant neoplasm of unspecified part of unspecified bronchus or lung: Secondary | ICD-10-CM

## 2012-05-05 DIAGNOSIS — C3410 Malignant neoplasm of upper lobe, unspecified bronchus or lung: Secondary | ICD-10-CM

## 2012-05-05 DIAGNOSIS — L280 Lichen simplex chronicus: Secondary | ICD-10-CM | POA: Diagnosis not present

## 2012-05-05 DIAGNOSIS — E876 Hypokalemia: Secondary | ICD-10-CM

## 2012-05-05 MED ORDER — POTASSIUM CHLORIDE CRYS ER 20 MEQ PO TBCR: 20.0000 meq | EXTENDED_RELEASE_TABLET | Freq: Every day | ORAL | Status: DC

## 2012-05-05 NOTE — Progress Notes (Signed)
Problem #1 stage IA adenocarcinoma the right upper lobe status post lobectomy on 10/15/2010 with no nodes involved but a 1.6 cm primary that was intermediate grade no LV I was seen. He is here for routine followup. Problem #2 COPD though he has quit smoking just before surgery unfortunately however he is using oral tobacco products. I've asked him to quit using that as well. 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   He states that he is doing well and his wife confirms that. He denies shortness of breath. He has chronic back pain from an old injury. He is on disability for that but his pain is not new or different. He is not aware of lumps or bumps anywhere no night sweats no fevers no chills etc. Vital signs are stable. Lymph nodes are negative throughout. Abdomen is soft and nontender without organomegaly. He has no leg edema no arm edema. He does have to neurodermatitis lesions on his upper back on the right. He has markedly diminished breath sounds throughout both lungs but they are clear. His heart shows a regular rhythm and rate without murmur rub or gallop but he absolutely denies any chest pain.  His CT scan was fine we will repeat it in 6 months and then we will change in the once a year followup with the CT scans. He states that he we'll get Dr. Regino Schultze to do his blood work in October when he sees him for routine exam. His hypokalemia is most likely secondary to his hydrochlorothiazide.

## 2012-06-08 DIAGNOSIS — R7309 Other abnormal glucose: Secondary | ICD-10-CM | POA: Diagnosis not present

## 2012-06-08 DIAGNOSIS — E785 Hyperlipidemia, unspecified: Secondary | ICD-10-CM | POA: Diagnosis not present

## 2012-06-08 DIAGNOSIS — Z125 Encounter for screening for malignant neoplasm of prostate: Secondary | ICD-10-CM | POA: Diagnosis not present

## 2012-06-08 DIAGNOSIS — I10 Essential (primary) hypertension: Secondary | ICD-10-CM | POA: Diagnosis not present

## 2012-08-23 DIAGNOSIS — E785 Hyperlipidemia, unspecified: Secondary | ICD-10-CM | POA: Diagnosis not present

## 2012-08-23 DIAGNOSIS — I10 Essential (primary) hypertension: Secondary | ICD-10-CM | POA: Diagnosis not present

## 2012-08-23 DIAGNOSIS — G8929 Other chronic pain: Secondary | ICD-10-CM | POA: Diagnosis not present

## 2012-08-23 DIAGNOSIS — Z6826 Body mass index (BMI) 26.0-26.9, adult: Secondary | ICD-10-CM | POA: Diagnosis not present

## 2012-09-11 DIAGNOSIS — G8929 Other chronic pain: Secondary | ICD-10-CM | POA: Diagnosis not present

## 2012-09-11 DIAGNOSIS — Z6826 Body mass index (BMI) 26.0-26.9, adult: Secondary | ICD-10-CM | POA: Diagnosis not present

## 2012-09-11 DIAGNOSIS — K116 Mucocele of salivary gland: Secondary | ICD-10-CM | POA: Diagnosis not present

## 2012-11-01 ENCOUNTER — Ambulatory Visit (HOSPITAL_COMMUNITY)
Admission: RE | Admit: 2012-11-01 | Discharge: 2012-11-01 | Disposition: A | Discharge status: Home / Self Care | Payer: Medicare Other | Source: Ambulatory Visit | Attending: Oncology | Admitting: Oncology

## 2012-11-01 DIAGNOSIS — J438 Other emphysema: Secondary | ICD-10-CM | POA: Diagnosis not present

## 2012-11-01 DIAGNOSIS — C3490 Malignant neoplasm of unspecified part of unspecified bronchus or lung: Secondary | ICD-10-CM | POA: Insufficient documentation

## 2012-11-03 ENCOUNTER — Ambulatory Visit (HOSPITAL_COMMUNITY): Payer: BC Managed Care – PPO | Admitting: Oncology

## 2012-11-03 ENCOUNTER — Encounter (HOSPITAL_COMMUNITY): Payer: Self-pay | Admitting: Oncology

## 2012-11-03 ENCOUNTER — Encounter (HOSPITAL_COMMUNITY): Payer: Medicare Other | Attending: Oncology | Admitting: Oncology

## 2012-11-03 VITALS — BP 126/77 | HR 77 | Temp 97.6°F | Resp 16 | Wt 165.5 lb

## 2012-11-03 DIAGNOSIS — C3491 Malignant neoplasm of unspecified part of right bronchus or lung: Secondary | ICD-10-CM

## 2012-11-03 DIAGNOSIS — J069 Acute upper respiratory infection, unspecified: Secondary | ICD-10-CM | POA: Diagnosis not present

## 2012-11-03 DIAGNOSIS — L280 Lichen simplex chronicus: Secondary | ICD-10-CM | POA: Diagnosis not present

## 2012-11-03 DIAGNOSIS — C3410 Malignant neoplasm of upper lobe, unspecified bronchus or lung: Secondary | ICD-10-CM | POA: Diagnosis not present

## 2012-11-03 DIAGNOSIS — M25519 Pain in unspecified shoulder: Secondary | ICD-10-CM | POA: Diagnosis not present

## 2012-11-03 MED ORDER — AMOXICILLIN-POT CLAVULANATE 875-125 MG PO TABS: 1.0000 | ORAL_TABLET | Freq: Two times a day (BID) | ORAL | Status: DC

## 2012-11-03 NOTE — Progress Notes (Signed)
[name], MD [address] [place] [address]  Adenocarcinoma of lung, right  CURRENT THERAPY: Surveillance per NCCN guidelines.  Will transition to yearly CT of chest through 10/2014  INTERVAL HISTORY: Maurice MACKOWSKI 67 y.o. 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 Primary tumor 1.6 cm in size 0/4 positive lymph nodes. Grade 2 cancer. No LVI.  The patient's only complaints including chronic low back pain which is the reason for his disability and right shoulder discomfort with a history of bursitis in the past status post steroid injection with resolution. He asks if I am able to provide him a steroid injection for this discomfort and I informed him I am unable to perform the procedure at this clinic. I will defer this to the patient's primary care physician.  I personally reviewed and went over radiographic studies with the patient.  We'll report follows. No recurrence or metastatic disease appreciated on CT of chest without contrast.  Per NCCN guidelines, the patient has completed 2 years worth of CT of chest surveillance every 6 months. We will now transition to yearly CT of chest without contrast surveillance for 3 years. We will complete CT surveillance in February of 2016.  Mr. Zane reports a cough that is nonproductive and a postnasal drip. He reports nasal discharge when blowing his nose that is colored green. This is been going on for one week. He denies any fevers, or chills. He does not feel well as it resolves. He admits to tiredness and weakness associated with this infection. As a result, I will prescribe him Augmentin for 10 days. He reports that he has taken Z-Paks in the past for similar symptoms by his primary care physician and Z-Paks are not always effective for him. I provided patient with side effects of Augmentin including nausea, vomiting, and diarrhea. If he has any  issues with the Augmentin, he should discontinue this medication and contact his primary care physician.  Oncologically, the patient denies any complaints and ROS questioning is negative.     Past Medical History  Diagnosis Date  . Cancer     rt lung/dx 2011/surg only  . Hypertension   . Lung cancer   . COPD (chronic obstructive pulmonary disease)   . High cholesterol   . Chronic back pain   . Pneumonia due to Streptococcus     HX. POST OPERATIVELY  . Back fracture     DUE TO AA IN 1970  . Stress fx pelvis     DUE TO AA IN 1970  . Adenocarcinoma of lung 05/06/2011  . Hypertension 05/06/2011  . COPD (chronic obstructive pulmonary disease) 05/06/2011  . Emphysema 05/06/2011  . Hypercholesterolemia 05/06/2011    has Back fracture; Back fracture; Adenocarcinoma of lung; Hypertension; COPD (chronic obstructive pulmonary disease); Emphysema; and Hypercholesterolemia on his problem list.     is allergic to fentanyl and aspirin.  Mr. Tuller had no medications administered during this visit.  Past Surgical History  Procedure Laterality Date  . Back surgery    . Lung lobectomy  2012    RT UPPER LOBE    Denies any headaches, dizziness, double vision, fevers, chills, night sweats, nausea, vomiting, diarrhea, constipation, chest pain, heart palpitations, shortness of breath, blood in stool, black tarry stool, urinary pain, urinary burning, urinary frequency, hematuria.   PHYSICAL EXAMINATION  ECOG PERFORMANCE STATUS: 1 - Symptomatic but completely ambulatory  Filed  Vitals:   11/03/12 0949  BP: 126/77  Pulse: 77  Temp: 97.6 F (36.4 C)  Resp: 16    GENERAL:alert, no distress, well nourished, well developed, comfortable, cooperative and smiling SKIN: skin color, texture, turgor are normal, no rashes.  Right upper back neurodermatitis lesions. HEAD: Normocephalic, No masses, lesions, tenderness or abnormalities EYES: normal, Conjunctiva are pink and non-injected EARS: External  ears normal OROPHARYNX:lips, buccal mucosa, and tongue normal, halitosis, moderate erythema and mucous membranes are moist  NECK: supple, no adenopathy, thyroid normal size, non-tender, without nodularity, no stridor, non-tender, trachea midline LYMPH:  no palpable lymphadenopathy, no hepatosplenomegaly BREAST:not examined LUNGS: clear to auscultation and percussion HEART: regular rate & rhythm, no murmurs, no gallops, S1 normal and S2 normal ABDOMEN:abdomen soft, non-tender and normal bowel sounds BACK: Back symmetric, no curvature., No CVA tenderness EXTREMITIES:less then 2 second capillary refill, no joint deformities, effusion, or inflammation, no edema, no skin discoloration, no clubbing, no cyanosis, positive findings: right shoulder tenderness on passive and active ROM including shoulder abduction, extension and flexion in all directions. No rash on right shoulder noted. NEURO: alert & oriented x 3 with fluent speech, no focal motor/sensory deficits, gait normal    LABORATORY DATA: CBC    Component Value Date/Time   WBC 6.6 05/03/2012 0855   RBC 4.19* 05/03/2012 0855   HGB 13.7 05/03/2012 0855   HCT 40.8 05/03/2012 0855   PLT 252 05/03/2012 0855   MCV 97.4 05/03/2012 0855   MCH 32.7 05/03/2012 0855   MCHC 33.6 05/03/2012 0855   RDW 12.5 05/03/2012 0855   LYMPHSABS 1.8 05/03/2012 0855   MONOABS 0.5 05/03/2012 0855   EOSABS 0.2 05/03/2012 0855   BASOSABS 0.1 05/03/2012 0855      Chemistry      Component Value Date/Time   NA 139 05/03/2012 0855   K 3.4* 05/03/2012 0855   CL 101 05/03/2012 0855   CO2 29 05/03/2012 0855   BUN 20 05/03/2012 0855   CREATININE 1.02 05/03/2012 0855      Component Value Date/Time   CALCIUM 9.3 05/03/2012 0855   ALKPHOS 64 05/03/2012 0855   AST 14 05/03/2012 0855   ALT 18 05/03/2012 0855   BILITOT 0.3 05/03/2012 0855        RADIOGRAPHIC STUDIES:  11/02/2011  *RADIOLOGY REPORT*  Clinical Data: Follow-up lung cancer. History of partial lobectomy  2 years  ago.  CT CHEST WITHOUT CONTRAST  Technique: Multidetector CT imaging of the chest was performed  following the standard protocol without IV contrast.  Comparison: Chest CT 05/03/2012 and earlier priors dating back to  08/17/2008  Findings: Normal heart size. Atherosclerotic calcification of the  coronary arteries, thoracic aorta, and visualized great vessels.  Normal thyroid gland. Stable and normal-sized scattered  mediastinal lymph nodes. No mediastinal, hilar, or axillary  lymphadenopathy. Negative for pleural or pericardial effusion.  Stable small hiatal hernia.  Postoperative changes of right upper lobectomy. Mild to moderate  centrilobular emphysema. Stable 4 mm nodule left upper lobe is  unchanged dating back to a CT of 2009, and can be considered  benign. No new or suspicious pulmonary nodules. Negative for  airspace disease. The trachea and mainstem bronchi are patent.  The esophagus is unremarkable.  Thoracic spine vertebral bodies are normal in height and alignment.  No evidence of bony metastatic disease to the thorax.  Imaging of the upper abdomen demonstrate a normal appearance of the  adrenal glands (the inferior aspect of the left limb of the adrenal  gland is not included on the study). The noncontrast appearance of  the visualized portion of the liver, spleen, and kidneys is within  normal limits. Visualized portion of the intra-abdominal stomach  and pancreas is also within normal limits.  IMPRESSION:  1. Stable chest CT. Prior right upper lobectomy. No evidence of  recurrent or metastatic disease in the thorax.  2. Centrilobular emphysema.  3. Coronary artery, aortic, and great vessel atherosclerosis.  4. Small hiatal hernia.  Original Report Authenticated By: Britta Mccreedy, M.D.    PATHOLOGY: 1. Lung, resection, right upper- Adenocarcinoma 1.6 cm, margins not involved, anthracotic lymph node. No metastatic carcinoma identified. 0/4 lymph nodes for disease.      ASSESSMENT:  1. 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 S/P right upper lobe lobectomy on 10/15/10 by Dr. Edwyna Shell.  2. Right thorax discomfort secondary to lobectomy procedure, improved 3. Low back pain, chronic, on disability 4. 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 5. Right shoulder discomfort with history of bursitis, will defer to PCP. 6. URI   PLAN:  1. Per NCCN guidelines patient will now start to have annual Ct of chest without contrast since he is 2 years out from diagnosis for a total of 3 years totaling 5 years worth of surveillance.  2. I personally reviewed and went over laboratory results with the patient. 3. I personally reviewed and went over radiographic studies with the patient. 4. Labs per PCP. 5. Recommended NSAIDs for right shoulder pain 6. Recommended Ice and Heat for right shoulder pain 7. Follow-up with PCP for right shoulder pain. 8. Augmentin x 10 days escribed to [facility] for URI. 9. CT of chest without contrast in 1 year 10. Return in 6 months for follow-up. Will likely transition to yearly appointments in 2015 after CT scans.    All questions were answered. The patient knows to call the clinic with any problems, questions or concerns. We can certainly see the patient much sooner if necessary.  The patient and plan discussed with Glenford Peers, MD and he is in agreement with the aforementioned.  Chue Berkovich

## 2012-11-03 NOTE — Patient Instructions (Addendum)
[facility] Discharge Instructions  RECOMMENDATIONS MADE BY THE CONSULTANT AND ANY TEST RESULTS WILL BE SENT TO YOUR REFERRING PHYSICIAN.  EXAM FINDINGS BY THE PHYSICIAN TODAY AND SIGNS OR SYMPTOMS TO REPORT TO CLINIC OR PRIMARY PHYSICIAN: exam and discussion by PA.  You can take aleve or Ibuprofen for your shoulder discomfort and follow-up with your primary care physician.  You Chest CT was good and we will repeat it in 1 year.  MEDICATIONS PRESCRIBED:  Augmentin take 1 tablet twice daily for 10 days.  INSTRUCTIONS GIVEN AND DISCUSSED: Report cough, blood in your sputum or increased shortness of breath  SPECIAL INSTRUCTIONS/FOLLOW-UP: Follow-up in 6 months and CT of chest in 1 year.  Thank you for choosing [facility] to provide your oncology and hematology care.  To afford each patient quality time with our providers, please arrive at least 15 minutes before your scheduled appointment time.  With your help, our goal is to use those 15 minutes to complete the necessary work-up to ensure our physicians have the information they need to help with your evaluation and healthcare recommendations.    Effective January 1st, 2014, we ask that you re-schedule your appointment with our physicians should you arrive 10 or more minutes late for your appointment.  We strive to give you quality time with our providers, and arriving late affects you and other patients whose appointments are after yours.    Again, thank you for choosing [facility].  Our hope is that these requests will decrease the amount of time that you wait before being seen by our physicians.       _____________________________________________________________  Should you have questions after your visit to [facility], please contact our office at [phone] between the hours of 8:30 a.m. and 5:00 p.m.  Voicemails left after 4:30 p.m. will not be returned until the following  business day.  For prescription refill requests, have your pharmacy contact our office with your prescription refill request.

## 2012-11-11 DIAGNOSIS — M67919 Unspecified disorder of synovium and tendon, unspecified shoulder: Secondary | ICD-10-CM | POA: Diagnosis not present

## 2012-11-11 DIAGNOSIS — Z6827 Body mass index (BMI) 27.0-27.9, adult: Secondary | ICD-10-CM | POA: Diagnosis not present

## 2012-11-11 DIAGNOSIS — M719 Bursopathy, unspecified: Secondary | ICD-10-CM | POA: Diagnosis not present

## 2012-11-11 DIAGNOSIS — E785 Hyperlipidemia, unspecified: Secondary | ICD-10-CM | POA: Diagnosis not present

## 2012-11-11 DIAGNOSIS — R7309 Other abnormal glucose: Secondary | ICD-10-CM | POA: Diagnosis not present

## 2012-11-11 DIAGNOSIS — G8929 Other chronic pain: Secondary | ICD-10-CM | POA: Diagnosis not present

## 2013-01-24 DIAGNOSIS — Z6826 Body mass index (BMI) 26.0-26.9, adult: Secondary | ICD-10-CM | POA: Diagnosis not present

## 2013-01-24 DIAGNOSIS — I10 Essential (primary) hypertension: Secondary | ICD-10-CM | POA: Diagnosis not present

## 2013-01-24 DIAGNOSIS — IMO0002 Reserved for concepts with insufficient information to code with codable children: Secondary | ICD-10-CM | POA: Diagnosis not present

## 2013-01-24 DIAGNOSIS — G8929 Other chronic pain: Secondary | ICD-10-CM | POA: Diagnosis not present

## 2013-01-24 DIAGNOSIS — M75100 Unspecified rotator cuff tear or rupture of unspecified shoulder, not specified as traumatic: Secondary | ICD-10-CM | POA: Diagnosis not present

## 2013-02-01 DIAGNOSIS — M7520 Bicipital tendinitis, unspecified shoulder: Secondary | ICD-10-CM | POA: Diagnosis not present

## 2013-02-01 DIAGNOSIS — M25519 Pain in unspecified shoulder: Secondary | ICD-10-CM | POA: Diagnosis not present

## 2013-04-11 DIAGNOSIS — E785 Hyperlipidemia, unspecified: Secondary | ICD-10-CM | POA: Diagnosis not present

## 2013-04-11 DIAGNOSIS — M1990 Unspecified osteoarthritis, unspecified site: Secondary | ICD-10-CM | POA: Diagnosis not present

## 2013-04-11 DIAGNOSIS — Z6826 Body mass index (BMI) 26.0-26.9, adult: Secondary | ICD-10-CM | POA: Diagnosis not present

## 2013-04-11 DIAGNOSIS — I10 Essential (primary) hypertension: Secondary | ICD-10-CM | POA: Diagnosis not present

## 2013-05-03 ENCOUNTER — Encounter (HOSPITAL_COMMUNITY): Payer: Self-pay | Admitting: Oncology

## 2013-05-03 ENCOUNTER — Ambulatory Visit (HOSPITAL_COMMUNITY): Payer: Medicare Other | Admitting: Oncology

## 2013-05-03 ENCOUNTER — Encounter (HOSPITAL_COMMUNITY): Payer: Medicare Other | Attending: Oncology | Admitting: Oncology

## 2013-05-03 VITALS — BP 149/66 | HR 91 | Temp 98.2°F | Resp 18 | Wt 166.1 lb

## 2013-05-03 DIAGNOSIS — G8929 Other chronic pain: Secondary | ICD-10-CM | POA: Diagnosis not present

## 2013-05-03 DIAGNOSIS — M539 Dorsopathy, unspecified: Secondary | ICD-10-CM | POA: Diagnosis not present

## 2013-05-03 DIAGNOSIS — C3491 Malignant neoplasm of unspecified part of right bronchus or lung: Secondary | ICD-10-CM

## 2013-05-03 DIAGNOSIS — M549 Dorsalgia, unspecified: Secondary | ICD-10-CM

## 2013-05-03 DIAGNOSIS — C3410 Malignant neoplasm of upper lobe, unspecified bronchus or lung: Secondary | ICD-10-CM

## 2013-05-03 NOTE — Patient Instructions (Addendum)
[facility] Discharge Instructions  RECOMMENDATIONS MADE BY THE CONSULTANT AND ANY TEST RESULTS WILL BE SENT TO YOUR REFERRING PHYSICIAN.  EXAM FINDINGS BY THE PHYSICIAN TODAY AND SIGNS OR SYMPTOMS TO REPORT TO CLINIC OR PRIMARY PHYSICIAN: exam and discussion by Dellis Anes, PA-C.  You are doing well.  MEDICATIONS PRESCRIBED:  none  INSTRUCTIONS GIVEN AND DISCUSSED: Report any new lumps, bone pain, shortness of breath, unexplained weight loss, etc.  SPECIAL INSTRUCTIONS/FOLLOW-UP: Follow-up in 6 months with blood work, CT of Chest and follow-up.  Thank you for choosing [facility] to provide your oncology and hematology care.  To afford each patient quality time with our providers, please arrive at least 15 minutes before your scheduled appointment time.  With your help, our goal is to use those 15 minutes to complete the necessary work-up to ensure our physicians have the information they need to help with your evaluation and healthcare recommendations.    Effective January 1st, 2014, we ask that you re-schedule your appointment with our physicians should you arrive 10 or more minutes late for your appointment.  We strive to give you quality time with our providers, and arriving late affects you and other patients whose appointments are after yours.    Again, thank you for choosing [facility].  Our hope is that these requests will decrease the amount of time that you wait before being seen by our physicians.       _____________________________________________________________  Should you have questions after your visit to [facility], please contact our office at [phone] between the hours of 8:30 a.m. and 5:00 p.m.  Voicemails left after 4:30 p.m. will not be returned until the following business day.  For prescription refill requests, have your pharmacy contact our office with your prescription refill request.

## 2013-05-03 NOTE — Progress Notes (Signed)
Maurice Ruths, MD [address] [place] [address]  Adenocarcinoma of lung, right - Plan: fentaNYL (DURAGESIC - DOSED MCG/HR) 25 MCG/HR patch, CBC with Differential, Comprehensive metabolic panel  CURRENT THERAPY: Surveillance per NCCN guidelines  INTERVAL HISTORY: Maurice Orozco 67 y.o. 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 Primary tumor 1.6 cm in size 0/4 positive lymph nodes. Grade 2 cancer. No LVI.  He denies any hemoptysis or cough.  From a lung cancer standpoint he is doing great.  Appetite is strong, weight is stable.  I have asked him to maintain his current weight at 166 lbs and try not to continue with weight gain.    His only complaint is his right shoulder.  He is seeing a sports medicine MD in [place] about the right shoulder.  He recently received a steroid injection about 1 month ago without any major improvement.  It sounds like he may have a ligament injury.  Oncologically, he denies any complaints and ROS questioning is negative.  We will continue to follow NCCN guideline recommendations for surveillance of his Stage I Lung Cancer.   Past Medical History  Diagnosis Date  . Cancer     rt lung/dx 2011/surg only  . Hypertension   . Lung cancer   . COPD (chronic obstructive pulmonary disease)   . High cholesterol   . Chronic back pain   . Pneumonia due to Streptococcus     HX. POST OPERATIVELY  . Back fracture     DUE TO AA IN 1970  . Stress fx pelvis     DUE TO AA IN 1970  . Adenocarcinoma of lung 05/06/2011  . Hypertension 05/06/2011  . COPD (chronic obstructive pulmonary disease) 05/06/2011  . Emphysema 05/06/2011  . Hypercholesterolemia 05/06/2011    has Back fracture; Back fracture; Adenocarcinoma of lung; Hypertension; COPD (chronic obstructive pulmonary disease); Emphysema; and Hypercholesterolemia on his problem list.     is allergic to fentanyl and  aspirin.  Maurice Orozco had no medications administered during this visit.  Past Surgical History  Procedure Laterality Date  . Back surgery    . Lung lobectomy  2012    RT UPPER LOBE    Denies any headaches, dizziness, double vision, fevers, chills, night sweats, nausea, vomiting, diarrhea, constipation, chest pain, heart palpitations, shortness of breath, blood in stool, black tarry stool, urinary pain, urinary burning, urinary frequency, hematuria.   PHYSICAL EXAMINATION  ECOG PERFORMANCE STATUS: 0 - Asymptomatic  Filed Vitals:   05/03/13 1000  BP: 149/66  Pulse: 91  Temp: 98.2 F (36.8 C)  Resp: 18    GENERAL:alert, no distress, well nourished, well developed, comfortable, cooperative and smiling SKIN: skin color, texture, turgor are normal, no rashes or significant lesions HEAD: Normocephalic, No masses, lesions, tenderness or abnormalities EYES: normal, PERRLA, EOMI, Conjunctiva are pink and non-injected EARS: External ears normal OROPHARYNX:mucous membranes are moist  NECK: supple, no adenopathy, thyroid normal size, non-tender, without nodularity, no stridor, non-tender, trachea midline LYMPH:  no palpable lymphadenopathy, no hepatosplenomegaly BREAST:not examined LUNGS: clear to auscultation and percussion HEART: regular rate & rhythm, no murmurs, no gallops, S1 normal and S2 normal ABDOMEN:abdomen soft, non-tender, normal bowel sounds, no masses or organomegaly and no hepatosplenomegaly BACK: Back symmetric, no curvature., No CVA tenderness EXTREMITIES:less then 2 second capillary refill, no joint deformities, effusion, or inflammation, no edema, no skin discoloration, no clubbing, no  cyanosis  NEURO: alert & oriented x 3 with fluent speech, no focal motor/sensory deficits, gait normal    LABORATORY DATA: CBC    Component Value Date/Time   WBC 6.6 05/03/2012 0855   RBC 4.19* 05/03/2012 0855   HGB 13.7 05/03/2012 0855   HCT 40.8 05/03/2012 0855   PLT 252  05/03/2012 0855   MCV 97.4 05/03/2012 0855   MCH 32.7 05/03/2012 0855   MCHC 33.6 05/03/2012 0855   RDW 12.5 05/03/2012 0855   LYMPHSABS 1.8 05/03/2012 0855   MONOABS 0.5 05/03/2012 0855   EOSABS 0.2 05/03/2012 0855   BASOSABS 0.1 05/03/2012 0855      Chemistry      Component Value Date/Time   NA 139 05/03/2012 0855   K 3.4* 05/03/2012 0855   CL 101 05/03/2012 0855   CO2 29 05/03/2012 0855   BUN 20 05/03/2012 0855   CREATININE 1.02 05/03/2012 0855      Component Value Date/Time   CALCIUM 9.3 05/03/2012 0855   ALKPHOS 64 05/03/2012 0855   AST 14 05/03/2012 0855   ALT 18 05/03/2012 0855   BILITOT 0.3 05/03/2012 0855        RADIOGRAPHIC STUDIES:  11/01/2012  *RADIOLOGY REPORT*  Clinical Data: Follow-up lung cancer. History of partial lobectomy  2 years ago.  CT CHEST WITHOUT CONTRAST  Technique: Multidetector CT imaging of the chest was performed  following the standard protocol without IV contrast.  Comparison: Chest CT 05/03/2012 and earlier priors dating back to  08/17/2008  Findings: Normal heart size. Atherosclerotic calcification of the  coronary arteries, thoracic aorta, and visualized great vessels.  Normal thyroid gland. Stable and normal-sized scattered  mediastinal lymph nodes. No mediastinal, hilar, or axillary  lymphadenopathy. Negative for pleural or pericardial effusion.  Stable small hiatal hernia.  Postoperative changes of right upper lobectomy. Mild to moderate  centrilobular emphysema. Stable 4 mm nodule left upper lobe is  unchanged dating back to a CT of 2009, and can be considered  benign. No new or suspicious pulmonary nodules. Negative for  airspace disease. The trachea and mainstem bronchi are patent.  The esophagus is unremarkable.  Thoracic spine vertebral bodies are normal in height and alignment.  No evidence of bony metastatic disease to the thorax.  Imaging of the upper abdomen demonstrate a normal appearance of the  adrenal glands (the inferior  aspect of the left limb of the adrenal  gland is not included on the study). The noncontrast appearance of  the visualized portion of the liver, spleen, and kidneys is within  normal limits. Visualized portion of the intra-abdominal stomach  and pancreas is also within normal limits.  IMPRESSION:  1. Stable chest CT. Prior right upper lobectomy. No evidence of  recurrent or metastatic disease in the thorax.  2. Centrilobular emphysema.  3. Coronary artery, aortic, and great vessel atherosclerosis.  4. Small hiatal hernia.  Original Report Authenticated By: Britta Mccreedy, M.D    PATHOLOGY: 1. Lung, resection, right upper- Adenocarcinoma 1.6 cm, margins not involved, anthracotic lymph node. No metastatic carcinoma identified. 0/4 lymph nodes for disease.     ASSESSMENT:  1. 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 Primary tumor 1.6 cm in size 0/4 positive lymph nodes. Grade 2 cancer. No LVI. 2. Right thorax discomfort secondary to lobectomy procedure, improved  3. Low back pain, chronic, on disability  4. 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   Patient Active Problem List   Diagnosis Date Noted  .  Adenocarcinoma of lung 05/06/2011  . Hypertension 05/06/2011  . COPD (chronic obstructive pulmonary disease) 05/06/2011  . Emphysema 05/06/2011  . Hypercholesterolemia 05/06/2011  . Back fracture   . Back fracture        PLAN:  1. I personally reviewed and went over laboratory results with the patient. 2. I personally reviewed and went over radiographic studies with the patient. 3. Next surveillance CT of chest is scheduled for 10/24/2013. 4. Labs in 6 months: CBC diff, CMET 5. Recommended continued follow-up with [facility] for continued right shoulder pain.  6. Return in 6 months for follow-up following CT of chest   THERAPY PLAN:  Oncologically, he is doing well and we will continue with NCCN recommendations for his Stage I NSCLC surveillance  with CT of chest annually.  All questions were answered. The patient knows to call the clinic with any problems, questions or concerns. We can certainly see the patient much sooner if necessary.  Patient and plan discussed with Dr. Erline Hau and he is in agreement with the aforementioned.   Layia Walla

## 2013-07-01 DIAGNOSIS — Z6826 Body mass index (BMI) 26.0-26.9, adult: Secondary | ICD-10-CM | POA: Diagnosis not present

## 2013-07-01 DIAGNOSIS — G8929 Other chronic pain: Secondary | ICD-10-CM | POA: Diagnosis not present

## 2013-07-01 DIAGNOSIS — M779 Enthesopathy, unspecified: Secondary | ICD-10-CM | POA: Diagnosis not present

## 2013-09-15 DIAGNOSIS — Z6827 Body mass index (BMI) 27.0-27.9, adult: Secondary | ICD-10-CM | POA: Diagnosis not present

## 2013-09-15 DIAGNOSIS — J018 Other acute sinusitis: Secondary | ICD-10-CM | POA: Diagnosis not present

## 2013-09-15 DIAGNOSIS — G8929 Other chronic pain: Secondary | ICD-10-CM | POA: Diagnosis not present

## 2013-10-24 ENCOUNTER — Ambulatory Visit (HOSPITAL_COMMUNITY)
Admission: RE | Admit: 2013-10-24 | Discharge: 2013-10-24 | Disposition: A | Discharge status: Home / Self Care | Payer: Medicare Other | Source: Ambulatory Visit | Attending: Oncology | Admitting: Oncology

## 2013-10-24 ENCOUNTER — Encounter (HOSPITAL_COMMUNITY): Payer: Medicare Other | Attending: Hematology and Oncology

## 2013-10-24 DIAGNOSIS — C3491 Malignant neoplasm of unspecified part of right bronchus or lung: Secondary | ICD-10-CM

## 2013-10-24 DIAGNOSIS — C3410 Malignant neoplasm of upper lobe, unspecified bronchus or lung: Secondary | ICD-10-CM

## 2013-10-24 DIAGNOSIS — C3490 Malignant neoplasm of unspecified part of unspecified bronchus or lung: Secondary | ICD-10-CM | POA: Insufficient documentation

## 2013-10-24 DIAGNOSIS — Z09 Encounter for follow-up examination after completed treatment for conditions other than malignant neoplasm: Secondary | ICD-10-CM | POA: Diagnosis not present

## 2013-10-24 DIAGNOSIS — R911 Solitary pulmonary nodule: Secondary | ICD-10-CM | POA: Insufficient documentation

## 2013-10-24 LAB — CBC WITH DIFFERENTIAL/PLATELET
BASOS ABS: 0 10*3/uL (ref 0.0–0.1)
Basophils Relative: 1 % (ref 0–1)
Eosinophils Absolute: 0.1 10*3/uL (ref 0.0–0.7)
Eosinophils Relative: 2 % (ref 0–5)
HCT: 40.8 % (ref 39.0–52.0)
Hemoglobin: 13.8 g/dL (ref 13.0–17.0)
LYMPHS PCT: 33 % (ref 12–46)
Lymphs Abs: 2.1 10*3/uL (ref 0.7–4.0)
MCH: 33.4 pg (ref 26.0–34.0)
MCHC: 33.8 g/dL (ref 30.0–36.0)
MCV: 98.8 fL (ref 78.0–100.0)
Monocytes Absolute: 0.5 10*3/uL (ref 0.1–1.0)
Monocytes Relative: 8 % (ref 3–12)
NEUTROS ABS: 3.6 10*3/uL (ref 1.7–7.7)
Neutrophils Relative %: 57 % (ref 43–77)
PLATELETS: 257 10*3/uL (ref 150–400)
RBC: 4.13 MIL/uL — ABNORMAL LOW (ref 4.22–5.81)
RDW: 12.6 % (ref 11.5–15.5)
WBC: 6.2 10*3/uL (ref 4.0–10.5)

## 2013-10-24 LAB — COMPREHENSIVE METABOLIC PANEL
ALT: 18 U/L (ref 0–53)
AST: 14 U/L (ref 0–37)
Albumin: 4.1 g/dL (ref 3.5–5.2)
Alkaline Phosphatase: 48 U/L (ref 39–117)
BILIRUBIN TOTAL: 0.4 mg/dL (ref 0.3–1.2)
BUN: 19 mg/dL (ref 6–23)
CHLORIDE: 101 meq/L (ref 96–112)
CO2: 26 meq/L (ref 19–32)
Calcium: 9.1 mg/dL (ref 8.4–10.5)
Creatinine, Ser: 0.91 mg/dL (ref 0.50–1.35)
GFR calc Af Amer: >90 mL/min (ref >90)
GFR, EST NON AFRICAN AMERICAN: 86 mL/min — AB (ref >90)
Glucose, Bld: 108 mg/dL — ABNORMAL HIGH (ref 70–99)
POTASSIUM: 3.8 meq/L (ref 3.7–5.3)
SODIUM: 141 meq/L (ref 137–147)
Total Protein: 7.4 g/dL (ref 6.0–8.3)

## 2013-10-24 NOTE — Progress Notes (Signed)
Labs drawn today for cbc/diff,cmp 

## 2013-11-03 ENCOUNTER — Ambulatory Visit (HOSPITAL_COMMUNITY): Payer: Medicare Other | Admitting: Oncology

## 2013-11-22 NOTE — Progress Notes (Signed)
Leonides Grills, MD [address] [place] [address]  Adenocarcinoma of lung - Plan: CBC with Differential, Comprehensive metabolic panel, CT Chest W Contrast  CURRENT THERAPY: Surveillance per NCCN guidelines  INTERVAL HISTORY: Maurice Orozco 68 y.o. 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 Primary tumor 1.6 cm in size 0/4 positive lymph nodes. Grade 2 cancer. No LVI.    Adenocarcinoma of lung   09/30/2010 Cancer Staging CT of chest showed spiculated lesion   10/15/2010 Surgery Right upper lobectomy by Dr. Arlyce Dice   10/16/2010 Remission    I personally reviewed and went over laboratory results with the patient.  The results are noted within this dictation.  I personally reviewed and went over radiographic studies with the patient.  The results are noted within this dictation.  CT scan on 10/24/2013 demonstrates the following: 1. Enlarging subpleural nodule in the left lower lobe, now 9 mm,  with distortion of the overlying pleura. Finding is concerning for  in small bronchogenic carcinoma, and short interval follow-up CT  recommended.  With this information, we will perform a close interval surveillance CT scan as the 9 mm nodule is at the threshold of PET scan detection.  Therefore, if the nodule increases in size in 3 months, a PET scan would be indicated for evaluation of hypermetabolic activity.   Oncologically, he denies any complaints and ROS questioning is negative.    Past Medical History  Diagnosis Date  . Cancer     rt lung/dx 2011/surg only  . Hypertension   . Lung cancer   . COPD (chronic obstructive pulmonary disease)   . High cholesterol   . Chronic back pain   . Pneumonia due to Streptococcus     HX. POST OPERATIVELY  . Back fracture     DUE TO AA IN 1970  . Stress fx pelvis     DUE TO AA IN 1970  . Adenocarcinoma of lung 05/06/2011  . Hypertension 05/06/2011    . COPD (chronic obstructive pulmonary disease) 05/06/2011  . Emphysema 05/06/2011  . Hypercholesterolemia 05/06/2011    has Back fracture; Back fracture; Adenocarcinoma of lung; Hypertension; COPD (chronic obstructive pulmonary disease); Emphysema; and Hypercholesterolemia on his problem list.     is allergic to fentanyl and aspirin.  Mr. Stirewalt had no medications administered during this visit.  Past Surgical History  Procedure Laterality Date  . Back surgery    . Lung lobectomy  2012    RT UPPER LOBE    Denies any headaches, dizziness, double vision, fevers, chills, night sweats, nausea, vomiting, diarrhea, constipation, chest pain, heart palpitations, shortness of breath, blood in stool, black tarry stool, urinary pain, urinary burning, urinary frequency, hematuria.   PHYSICAL EXAMINATION  ECOG PERFORMANCE STATUS: 0 - Asymptomatic  Filed Vitals:   11/23/13 0953  BP: 132/68  Pulse: 85  Temp: 98 F (36.7 C)  Resp: 20    GENERAL:alert, no distress, well nourished, well developed, comfortable, cooperative and smiling SKIN: skin color, texture, turgor are normal, no rashes or significant lesions HEAD: Normocephalic, No masses, lesions, tenderness or abnormalities EYES: normal, PERRLA, EOMI, Conjunctiva are pink and non-injected EARS: External ears normal OROPHARYNX:mucous membranes are moist  NECK: supple, trachea midline LYMPH:  not examined BREAST:not examined LUNGS: clear to auscultation , decreased breath sounds throughout and bilaterally HEART: regular rate & rhythm, no murmurs, no gallops, S1 normal and S2  normal ABDOMEN:non-tender and normal bowel sounds BACK: Back symmetric, no curvature. EXTREMITIES:less then 2 second capillary refill, no joint deformities, effusion, or inflammation, no clubbing, no cyanosis  NEURO: alert & oriented x 3 with fluent speech, no focal motor/sensory deficits, gait normal   LABORATORY DATA: CBC    Component Value Date/Time   WBC  6.2 10/24/2013 0907   RBC 4.13* 10/24/2013 0907   HGB 13.8 10/24/2013 0907   HCT 40.8 10/24/2013 0907   PLT 257 10/24/2013 0907   MCV 98.8 10/24/2013 0907   MCH 33.4 10/24/2013 0907   MCHC 33.8 10/24/2013 0907   RDW 12.6 10/24/2013 0907   LYMPHSABS 2.1 10/24/2013 0907   MONOABS 0.5 10/24/2013 0907   EOSABS 0.1 10/24/2013 0907   BASOSABS 0.0 10/24/2013 0907      Chemistry      Component Value Date/Time   NA 141 10/24/2013 0907   K 3.8 10/24/2013 0907   CL 101 10/24/2013 0907   CO2 26 10/24/2013 0907   BUN 19 10/24/2013 0907   CREATININE 0.91 10/24/2013 0907      Component Value Date/Time   CALCIUM 9.1 10/24/2013 0907   ALKPHOS 48 10/24/2013 0907   AST 14 10/24/2013 0907   ALT 18 10/24/2013 0907   BILITOT 0.4 10/24/2013 0907       RADIOGRAPHIC STUDIES:  10/24/2013  CLINICAL DATA: Adenocarcinoma of the right lung. Surveillance  imaging.  EXAM:  CT CHEST WITHOUT CONTRAST  TECHNIQUE:  Multidetector CT imaging of the chest was performed following the  standard protocol without IV contrast.  COMPARISON: 11/01/2012  FINDINGS:  THORACIC INLET/BODY WALL:  No acute abnormality.  MEDIASTINUM:  Normal heart size. No pericardial effusion. Diffuse atherosclerosis,  including the coronary arteries. Small sliding-type hiatal hernia,  with lower esophageal and gastric cardia thickening mainly related  to submucosal fatty expansion. No acute vascular abnormality. No  adenopathy.  LUNG WINDOWS:  Status post right upper lobectomy. There is no evidence of nodular  recurrence at the resection site. No right-sided pleural nodularity  or effusion.  There is a 9 mm nodule in the left lower lobe along the major  fissure, larger than on previous examination (6 mm previously). The  pleural is slightly retracted at this location, a suspicious  finding.  There is a spiculated nodule at the left apex which is stable over  multiple years, measuring 5 mm.  UPPER ABDOMEN:  No acute findings.  OSSEOUS:  No  acute fracture. No suspicious lytic or blastic lesions.  IMPRESSION:  1. Enlarging subpleural nodule in the left lower lobe, now 9 mm,  with distortion of the overlying pleura. Finding is concerning for  in small bronchogenic carcinoma, and short interval follow-up CT  recommended.  2. No evidence of recurrence in the right chest, status post stage I  lung cancer resection.  Electronically Signed  By: Jorje Guild M.D.  On: 10/24/2013 11:00    ASSESSMENT:  1. 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 Primary tumor 1.6 cm in size 0/4 positive lymph nodes. Grade 2 cancer. No LVI. 2. Enlarging 9 mm nodule in the left lower lobe, measuring 9 mm on CT scan on 10/24/2013.  3. Right thorax discomfort secondary to lobectomy procedure, improved  4. Low back pain, chronic, on disability  5. 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  6. Seasonal allergies  Patient Active Problem List   Diagnosis Date Noted  . Adenocarcinoma of lung 05/06/2011  . Hypertension 05/06/2011  . COPD (chronic  obstructive pulmonary disease) 05/06/2011  . Emphysema 05/06/2011  . Hypercholesterolemia 05/06/2011  . Back fracture   . Back fracture      PLAN:  1. I personally reviewed and went over laboratory results with the patient.  The results are noted within this dictation. 2. I personally reviewed and went over radiographic studies with the patient.  The results are noted within this dictation.   3. CT of chest w contrast in May 2015 for enlarging left lower lobe pulmonary nodule follow-up. 4. Labs prior to CT scan: CBC diff, CMET 5. Return in 2 months following CT scan to review results.    THERAPY PLAN:  Jacai's most recent CT of chest demonstrates a left lower lobe lesion that is enlarging and was most recently 9 mm on CT scan imaging.  We will perform a close interval CT scan to evaluate for growth as this lesion is at the threshold for PET imaging.  If enlarged in 2-3 months,  will order a PET scan.  If hypermetabolic, will refer the patient to thoracic surgery for evaluation and intervention if indicated.   All questions were answered. The patient knows to call the clinic with any problems, questions or concerns. We can certainly see the patient much sooner if necessary.  Patient and plan discussed with Dr. Farrel Gobble and he is in agreement with the aforementioned.   Ladaja Yusupov 11/23/2013

## 2013-11-23 ENCOUNTER — Encounter (HOSPITAL_COMMUNITY): Payer: Medicare Other | Attending: Hematology and Oncology | Admitting: Oncology

## 2013-11-23 VITALS — BP 132/68 | HR 85 | Temp 98.0°F | Resp 20 | Wt 162.6 lb

## 2013-11-23 DIAGNOSIS — M545 Low back pain, unspecified: Secondary | ICD-10-CM

## 2013-11-23 DIAGNOSIS — C3410 Malignant neoplasm of upper lobe, unspecified bronchus or lung: Secondary | ICD-10-CM | POA: Diagnosis not present

## 2013-11-23 DIAGNOSIS — C3490 Malignant neoplasm of unspecified part of unspecified bronchus or lung: Secondary | ICD-10-CM | POA: Insufficient documentation

## 2013-11-23 NOTE — Patient Instructions (Signed)
[facility] Discharge Instructions  RECOMMENDATIONS MADE BY THE CONSULTANT AND ANY TEST RESULTS WILL BE SENT TO YOUR REFERRING PHYSICIAN.  MEDICATIONS PRESCRIBED:  None  INSTRUCTIONS GIVEN AND DISCUSSED: Repeat CT scan of chest in mid-May 2015 to follow-up on 9 mm lung nodule.  SPECIAL INSTRUCTIONS/FOLLOW-UP: Labs in mid-May prior to CT scan. Return after CT scan  Thank you for choosing [facility] to provide your oncology and hematology care.  To afford each patient quality time with our providers, please arrive at least 15 minutes before your scheduled appointment time.  With your help, our goal is to use those 15 minutes to complete the necessary work-up to ensure our physicians have the information they need to help with your evaluation and healthcare recommendations.    Effective January 1st, 2014, we ask that you re-schedule your appointment with our physicians should you arrive 10 or more minutes late for your appointment.  We strive to give you quality time with our providers, and arriving late affects you and other patients whose appointments are after yours.    Again, thank you for choosing [facility].  Our hope is that these requests will decrease the amount of time that you wait before being seen by our physicians.       _____________________________________________________________  Should you have questions after your visit to [facility], please contact our office at [phone] between the hours of 8:30 a.m. and 5:00 p.m.  Voicemails left after 4:30 p.m. will not be returned until the following business day.  For prescription refill requests, have your pharmacy contact our office with your prescription refill request.

## 2013-11-30 DIAGNOSIS — G8929 Other chronic pain: Secondary | ICD-10-CM | POA: Diagnosis not present

## 2013-11-30 DIAGNOSIS — I10 Essential (primary) hypertension: Secondary | ICD-10-CM | POA: Diagnosis not present

## 2013-11-30 DIAGNOSIS — J329 Chronic sinusitis, unspecified: Secondary | ICD-10-CM | POA: Diagnosis not present

## 2013-11-30 DIAGNOSIS — E785 Hyperlipidemia, unspecified: Secondary | ICD-10-CM | POA: Diagnosis not present

## 2013-11-30 DIAGNOSIS — Z6825 Body mass index (BMI) 25.0-25.9, adult: Secondary | ICD-10-CM | POA: Diagnosis not present

## 2014-01-05 DIAGNOSIS — Z6826 Body mass index (BMI) 26.0-26.9, adult: Secondary | ICD-10-CM | POA: Diagnosis not present

## 2014-01-05 DIAGNOSIS — L03818 Cellulitis of other sites: Secondary | ICD-10-CM | POA: Diagnosis not present

## 2014-01-05 DIAGNOSIS — L02818 Cutaneous abscess of other sites: Secondary | ICD-10-CM | POA: Diagnosis not present

## 2014-01-20 ENCOUNTER — Encounter (HOSPITAL_COMMUNITY): Payer: Medicare Other | Attending: Hematology and Oncology

## 2014-01-20 DIAGNOSIS — C3490 Malignant neoplasm of unspecified part of unspecified bronchus or lung: Secondary | ICD-10-CM

## 2014-01-20 LAB — CBC WITH DIFFERENTIAL/PLATELET
BASOS ABS: 0.1 10*3/uL (ref 0.0–0.1)
BASOS PCT: 1 % (ref 0–1)
Eosinophils Absolute: 0.1 10*3/uL (ref 0.0–0.7)
Eosinophils Relative: 1 % (ref 0–5)
HCT: 43.8 % (ref 39.0–52.0)
Hemoglobin: 14.5 g/dL (ref 13.0–17.0)
LYMPHS PCT: 17 % (ref 12–46)
Lymphs Abs: 1.8 10*3/uL (ref 0.7–4.0)
MCH: 33.6 pg (ref 26.0–34.0)
MCHC: 33.1 g/dL (ref 30.0–36.0)
MCV: 101.6 fL — ABNORMAL HIGH (ref 78.0–100.0)
MONO ABS: 0.5 10*3/uL (ref 0.1–1.0)
Monocytes Relative: 5 % (ref 3–12)
NEUTROS ABS: 8.2 10*3/uL — AB (ref 1.7–7.7)
NEUTROS PCT: 76 % (ref 43–77)
PLATELETS: 359 10*3/uL (ref 150–400)
RBC: 4.31 MIL/uL (ref 4.22–5.81)
RDW: 12.9 % (ref 11.5–15.5)
WBC: 10.7 10*3/uL — ABNORMAL HIGH (ref 4.0–10.5)

## 2014-01-20 LAB — COMPREHENSIVE METABOLIC PANEL
ALK PHOS: 59 U/L (ref 39–117)
ALT: 24 U/L (ref 0–53)
AST: 20 U/L (ref 0–37)
Albumin: 4.2 g/dL (ref 3.5–5.2)
BILIRUBIN TOTAL: 0.3 mg/dL (ref 0.3–1.2)
BUN: 17 mg/dL (ref 6–23)
CHLORIDE: 98 meq/L (ref 96–112)
CO2: 28 meq/L (ref 19–32)
Calcium: 10.1 mg/dL (ref 8.4–10.5)
Creatinine, Ser: 0.98 mg/dL (ref 0.50–1.35)
GFR calc Af Amer: >90 mL/min (ref >90)
GFR, EST NON AFRICAN AMERICAN: 84 mL/min — AB (ref >90)
Glucose, Bld: 116 mg/dL — ABNORMAL HIGH (ref 70–99)
POTASSIUM: 4.7 meq/L (ref 3.7–5.3)
SODIUM: 140 meq/L (ref 137–147)
Total Protein: 7.7 g/dL (ref 6.0–8.3)

## 2014-01-23 ENCOUNTER — Ambulatory Visit (HOSPITAL_COMMUNITY)
Admission: RE | Admit: 2014-01-23 | Discharge: 2014-01-23 | Disposition: A | Discharge status: Home / Self Care | Payer: Medicare Other | Source: Ambulatory Visit | Attending: Oncology | Admitting: Oncology

## 2014-01-23 DIAGNOSIS — Z902 Acquired absence of lung [part of]: Secondary | ICD-10-CM | POA: Diagnosis not present

## 2014-01-23 DIAGNOSIS — R911 Solitary pulmonary nodule: Secondary | ICD-10-CM | POA: Insufficient documentation

## 2014-01-23 DIAGNOSIS — I2510 Atherosclerotic heart disease of native coronary artery without angina pectoris: Secondary | ICD-10-CM | POA: Diagnosis not present

## 2014-01-23 DIAGNOSIS — Z85118 Personal history of other malignant neoplasm of bronchus and lung: Secondary | ICD-10-CM | POA: Insufficient documentation

## 2014-01-23 DIAGNOSIS — C3490 Malignant neoplasm of unspecified part of unspecified bronchus or lung: Secondary | ICD-10-CM

## 2014-01-23 MED ORDER — IOHEXOL 300 MG/ML  SOLN
80.0000 mL | Freq: Once | INTRAMUSCULAR | Status: AC | PRN
  Administered 2014-01-23: 80 mL via INTRAVENOUS

## 2014-01-23 NOTE — Progress Notes (Signed)
Maurice Grills, MD [address] [place] [address]  Adenocarcinoma of lung - Plan: NM PET Image Initial (PI) Skull Base To Thigh  CURRENT THERAPY: Surveillance per NCCN guidelines with close surveillance of an enlarging pulmonary lesion in left lower lobe.  INTERVAL HISTORY: Maurice Orozco 68 y.o. 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 Primary tumor 1.6 cm in size 0/4 positive lymph nodes. Grade 2 cancer. No LVI.    Adenocarcinoma of lung   09/30/2010 Cancer Staging CT of chest showed spiculated lesion   10/15/2010 Surgery Right upper lobectomy by Dr. Arlyce Dice   10/16/2010 Remission    10/24/2013 Imaging CT Chest-  Enlarging subpleural nodule in the left lower lobe, now 9 mm, with distortion of the overlying pleura. Finding is concerning for a small bronchogenic carcinoma   01/23/2014 Imaging CT Chest- Left lower lobe nodule has enlarged from 11/01/2012 and is worrisome for primary bronchogenic carcinoma.    I personally reviewed and went over radiographic studies with the patient.  The results are noted within this dictation.    1. Left lower lobe nodule has enlarged from 11/01/2012 and is  worrisome for primary bronchogenic carcinoma.  2. Postoperative changes of right upper lobectomy, stable.  3. Three-vessel coronary artery calcification.  I reviewed his CT scan report as mentioned above.  His nodule has enlarged since Feb from 5 mm to 10 mm (in largest dimension).  The next step is to prove hypermetabolic activity of this lung lesion with a PET scan.  If active, I will refer him to [place] for consideration of SBRT.  I reviewed the possible scenarios that could occur moving forward, including radiation therapy. We discussed the risks, benefits, alternatives, and side effects of radiation therapy.  He would be a candidate for SBRT according to my evaluation, but will defer that decision  ultimately to Rad Onc of course.  He is agreeable to this plan as outlined above.    He denies any oncology complaints and ROS questioning is negative.   Past Medical History  Diagnosis Date  . Cancer     rt lung/dx 2011/surg only  . Hypertension   . Lung cancer   . COPD (chronic obstructive pulmonary disease)   . High cholesterol   . Chronic back pain   . Pneumonia due to Streptococcus     HX. POST OPERATIVELY  . Back fracture     DUE TO AA IN 1970  . Stress fx pelvis     DUE TO AA IN 1970  . Adenocarcinoma of lung 05/06/2011  . Hypertension 05/06/2011  . COPD (chronic obstructive pulmonary disease) 05/06/2011  . Emphysema 05/06/2011  . Hypercholesterolemia 05/06/2011    has Back fracture; Back fracture; Adenocarcinoma of lung; Hypertension; COPD (chronic obstructive pulmonary disease); Emphysema; and Hypercholesterolemia on his problem list.     is allergic to fentanyl and aspirin.  Maurice Orozco had no medications administered during this visit.  Past Surgical History  Procedure Laterality Date  . Back surgery    . Lung lobectomy  2012    RT UPPER LOBE    Denies any headaches, dizziness, double vision, fevers, chills, night sweats, nausea, vomiting, diarrhea, constipation, chest pain, heart palpitations, shortness of breath, blood in stool, black tarry stool, urinary pain, urinary burning, urinary frequency, hematuria.   PHYSICAL EXAMINATION  ECOG PERFORMANCE STATUS: 0 - Asymptomatic  Filed Vitals:  01/24/14 0939  BP: 146/81  Pulse: 80  Temp: 97.9 F (36.6 C)  Resp: 18    GENERAL:alert, healthy, no distress, well nourished, well developed, comfortable, cooperative and smiling SKIN: skin color, texture, turgor are normal, no rashes or significant lesions HEAD: Normocephalic, No masses, lesions, tenderness or abnormalities EYES: normal, PERRLA, EOMI, Conjunctiva are pink and non-injected EARS: External ears normal OROPHARYNX:mucous membranes are moist  NECK:  supple, trachea midline LYMPH:  not examined BREAST:not examined LUNGS: not examined HEART: not examined ABDOMEN:not examined BACK: Back symmetric, no curvature. EXTREMITIES:less then 2 second capillary refill, no skin discoloration, no cyanosis  NEURO: alert & oriented x 3 with fluent speech, no focal motor/sensory deficits, gait normal    LABORATORY DATA: CBC    Component Value Date/Time   WBC 10.7* 01/20/2014 1036   RBC 4.31 01/20/2014 1036   HGB 14.5 01/20/2014 1036   HCT 43.8 01/20/2014 1036   PLT 359 01/20/2014 1036   MCV 101.6* 01/20/2014 1036   MCH 33.6 01/20/2014 1036   MCHC 33.1 01/20/2014 1036   RDW 12.9 01/20/2014 1036   LYMPHSABS 1.8 01/20/2014 1036   MONOABS 0.5 01/20/2014 1036   EOSABS 0.1 01/20/2014 1036   BASOSABS 0.1 01/20/2014 1036      Chemistry      Component Value Date/Time   NA 140 01/20/2014 1036   K 4.7 01/20/2014 1036   CL 98 01/20/2014 1036   CO2 28 01/20/2014 1036   BUN 17 01/20/2014 1036   CREATININE 0.98 01/20/2014 1036      Component Value Date/Time   CALCIUM 10.1 01/20/2014 1036   ALKPHOS 59 01/20/2014 1036   AST 20 01/20/2014 1036   ALT 24 01/20/2014 1036   BILITOT 0.3 01/20/2014 1036       RADIOGRAPHIC STUDIES:  Ct Chest W Contrast  01/23/2014   CLINICAL DATA:  Followup lung nodule.  History of right lung cancer.  EXAM: CT CHEST WITH CONTRAST  TECHNIQUE: Multidetector CT imaging of the chest was performed during intravenous contrast administration.  CONTRAST:  22mL OMNIPAQUE IOHEXOL 300 MG/ML  SOLN  COMPARISON:  10/24/2013 and 11/01/2012.  FINDINGS: No pathologically enlarged mediastinal, hilar or axillary lymph nodes. Atherosclerotic calcification of the arterial vasculature, including three-vessel involvement of the coronary arteries. Heart size normal. No pericardial effusion. Small hiatal hernia.  Debris is seen dependently in the trachea. Postoperative changes of right upper lobectomy. 5 mm nodular density in apical portion of the left upper lobe  (image 9) is unchanged. Subpleural scarring in the posterior left upper lobe, along the left major fissure. A 9 x 10 mm nodule in the superior segment left lower lobe (axial image 37) has enlarged from 11/01/2012, at which time it measured 5 mm. No pleural fluid. Airway is otherwise unremarkable.  Incidental imaging of the upper abdomen shows the visualized portions of the liver, gallbladder, adrenal glands, kidneys, spleen, pancreas, stomach and bowel debris otherwise grossly unremarkable. No upper abdominal adenopathy. No worrisome lytic or sclerotic lesions. Degenerative changes are seen in the spine.  IMPRESSION: 1. Left lower lobe nodule has enlarged from 11/01/2012 and is worrisome for primary bronchogenic carcinoma. 2. Postoperative changes of right upper lobectomy, stable. 3. Three-vessel coronary artery calcification.   Electronically Signed   By: Lorin Picket M.D.   On: 01/23/2014 13:57      ASSESSMENT:  1. Enlarging left lower lobe pulmonary nodule.  2. 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 Primary tumor 1.6 cm in size 0/4 positive lymph  nodes. Grade 2 cancer. No LVI. 3. Right thorax discomfort secondary to lobectomy procedure, improved  4. Low back pain, chronic, on disability  5. 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  6. Seasonal allergies  Patient Active Problem List   Diagnosis Date Noted  . Adenocarcinoma of lung 05/06/2011  . Hypertension 05/06/2011  . COPD (chronic obstructive pulmonary disease) 05/06/2011  . Emphysema 05/06/2011  . Hypercholesterolemia 05/06/2011  . Back fracture   . Back fracture      PLAN:  1. I personally reviewed and went over laboratory results with the patient.  The results are noted within this dictation. 2. I personally reviewed and went over radiographic studies with the patient.  The results are noted within this dictation.   3. PET scan to evaluate for hypermetabolic activity of enlarging pulmonary lesion.   If positive, will refer to Rad Onc for consideration of SBRT. 4. Return in 2 weeks following PET scan.  Will refer to Rad Onc accordingly.   THERAPY PLAN:  Adyan's most recent CT of chest on 01/23/14 demonstrates further enlargement of left lower lobe lesion.  Therefore, I will prove hypermetabolic activity with PET scan and if positive, I will refer him to [place] for consideration of SBRT.   All questions were answered. The patient knows to call the clinic with any problems, questions or concerns. We can certainly see the patient much sooner if necessary.  Patient and plan discussed with Dr. Farrel Gobble and he is in agreement with the aforementioned.   [facility] 01/24/2014

## 2014-01-24 ENCOUNTER — Encounter (HOSPITAL_BASED_OUTPATIENT_CLINIC_OR_DEPARTMENT_OTHER): Payer: Medicare Other | Admitting: Oncology

## 2014-01-24 ENCOUNTER — Encounter (HOSPITAL_COMMUNITY): Payer: Self-pay | Admitting: Oncology

## 2014-01-24 VITALS — BP 146/81 | HR 80 | Temp 97.9°F | Resp 18 | Wt 158.5 lb

## 2014-01-24 DIAGNOSIS — M545 Low back pain, unspecified: Secondary | ICD-10-CM

## 2014-01-24 DIAGNOSIS — R911 Solitary pulmonary nodule: Secondary | ICD-10-CM | POA: Diagnosis not present

## 2014-01-24 DIAGNOSIS — C779 Secondary and unspecified malignant neoplasm of lymph node, unspecified: Secondary | ICD-10-CM | POA: Diagnosis not present

## 2014-01-24 DIAGNOSIS — G8929 Other chronic pain: Secondary | ICD-10-CM

## 2014-01-24 DIAGNOSIS — C3490 Malignant neoplasm of unspecified part of unspecified bronchus or lung: Secondary | ICD-10-CM

## 2014-01-24 DIAGNOSIS — C3410 Malignant neoplasm of upper lobe, unspecified bronchus or lung: Secondary | ICD-10-CM | POA: Diagnosis not present

## 2014-01-24 NOTE — Patient Instructions (Signed)
[facility] Discharge Instructions  RECOMMENDATIONS MADE BY THE CONSULTANT AND ANY TEST RESULTS WILL BE SENT TO YOUR REFERRING PHYSICIAN.  EXAM FINDINGS BY THE PHYSICIAN TODAY AND SIGNS OR SYMPTOMS TO REPORT TO CLINIC OR PRIMARY PHYSICIAN: Exam and findings as discussed by Robynn Pane, PA-C. Your scan did show some changes so we need to do a PET scan to see what's going on.  Once we get the PET scan we will have a better idea of what to do.  MEDICATIONS PRESCRIBED:  none  INSTRUCTIONS/FOLLOW-UP: PET scan on 6/1 at [facility].  Need to arrive at 6:45am.  Nothing to eat or drink after midnight the night before.  Nothing with sugar in it.  No gum, mints, hard candy, etc. Office visit on 6/2 to discuss plans.  Thank you for choosing [facility] to provide your oncology and hematology care.  To afford each patient quality time with our providers, please arrive at least 15 minutes before your scheduled appointment time.  With your help, our goal is to use those 15 minutes to complete the necessary work-up to ensure our physicians have the information they need to help with your evaluation and healthcare recommendations.    Effective January 1st, 2014, we ask that you re-schedule your appointment with our physicians should you arrive 10 or more minutes late for your appointment.  We strive to give you quality time with our providers, and arriving late affects you and other patients whose appointments are after yours.    Again, thank you for choosing [facility].  Our hope is that these requests will decrease the amount of time that you wait before being seen by our physicians.       _____________________________________________________________  Should you have questions after your visit to [facility], please contact our office at [phone] between the hours of 8:30 a.m. and 5:00 p.m.  Voicemails left after 4:30 p.m. will  not be returned until the following business day.  For prescription refill requests, have your pharmacy contact our office with your prescription refill request.

## 2014-02-03 NOTE — Progress Notes (Signed)
Labs drawn

## 2014-02-05 NOTE — Progress Notes (Signed)
Maurice Grills, MD [place] [address]  Adenocarcinoma of lung  CURRENT THERAPY: Surveillance per NCCN guidelines with work-up for an enlarging pulmonary lesion in left lower lobe with PET scan.   INTERVAL HISTORY: Maurice Orozco 68 y.o. male returns for  regular  visit for followup of an enlarging pulmonary nodule requiring close CT interval surveillance and now most recently a PET scan.  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 Primary tumor 1.6 cm in size 0/4 positive lymph nodes. Grade 2 cancer. No LVI.     Adenocarcinoma of lung   09/30/2010 Cancer Staging CT of chest showed spiculated lesion   10/15/2010 Surgery Right upper lobectomy by Dr. Arlyce Dice   10/16/2010 Remission    10/24/2013 Imaging CT Chest-  Enlarging subpleural nodule in the left lower lobe, now 9 mm, with distortion of the overlying pleura. Finding is concerning for a small bronchogenic carcinoma   01/23/2014 Imaging CT Chest- Left lower lobe nodule has enlarged from 11/01/2012 and is worrisome for primary bronchogenic carcinoma.     Maurice Orozco is here to review his PET scan results from yesterday.  I personally reviewed and went over radiographic studies with the patient.  The results are noted within this dictation.   1. The left lower lobe pulmonary nodule measures 9 mm and is too  small to reliably characterize by PET-CT. The SUV max associated  with this nodule is below the malignant range equal to 0.67.  However, given the demonstrated interval growth this remains  worrisome for primary bronchogenic carcinoma.  2. Atherosclerotic disease including multi vessel coronary artery  calcifications  With this information, we will repeat a CT of chest in 3 months.   Oncologically, he denies any complaints and ROS questioning is negative.   Past Medical History  Diagnosis Date  . Cancer     rt lung/dx 2011/surg only  . Hypertension   . Lung cancer   . COPD (chronic  obstructive pulmonary disease)   . High cholesterol   . Chronic back pain   . Pneumonia due to Streptococcus     HX. POST OPERATIVELY  . Back fracture     DUE TO AA IN 1970  . Stress fx pelvis     DUE TO AA IN 1970  . Adenocarcinoma of lung 05/06/2011  . Hypertension 05/06/2011  . COPD (chronic obstructive pulmonary disease) 05/06/2011  . Emphysema 05/06/2011  . Hypercholesterolemia 05/06/2011    has Back fracture; Adenocarcinoma of lung; Hypertension; COPD (chronic obstructive pulmonary disease); Emphysema; and Hypercholesterolemia on his problem list.     is allergic to fentanyl and aspirin.  Maurice Orozco does not currently have medications on file.  Past Surgical History  Procedure Laterality Date  . Back surgery    . Lung lobectomy  2012    RT UPPER LOBE    Denies any headaches, dizziness, double vision, fevers, chills, night sweats, nausea, vomiting, diarrhea, constipation, chest pain, heart palpitations, shortness of breath, blood in stool, black tarry stool, urinary pain, urinary burning, urinary frequency, hematuria.   PHYSICAL EXAMINATION  ECOG PERFORMANCE STATUS: 1 - Symptomatic but completely ambulatory  Filed Vitals:   02/07/14 1332  BP: 152/84  Pulse: 79  Resp: 20    GENERAL:alert, no distress, well nourished, well developed, comfortable, cooperative and smiling SKIN: skin color, texture, turgor are normal, no rashes or significant lesions HEAD: Normocephalic, No masses, lesions, tenderness or abnormalities EYES: normal, PERRLA, EOMI, Conjunctiva are pink and non-injected  EARS: External ears normal OROPHARYNX:mucous membranes are moist  NECK: supple, trachea midline LYMPH:  not examined BREAST:not examined LUNGS: not examined HEART: not examined ABDOMEN:not examined BACK: not examined EXTREMITIES:less then 2 second capillary refill, no joint deformities, effusion, or inflammation, no skin discoloration, no clubbing, no cyanosis  NEURO: alert & oriented x  3 with fluent speech, no focal motor/sensory deficits, gait normal   LABORATORY DATA: CBC    Component Value Date/Time   WBC 10.7* 01/20/2014 1036   RBC 4.31 01/20/2014 1036   HGB 14.5 01/20/2014 1036   HCT 43.8 01/20/2014 1036   PLT 359 01/20/2014 1036   MCV 101.6* 01/20/2014 1036   MCH 33.6 01/20/2014 1036   MCHC 33.1 01/20/2014 1036   RDW 12.9 01/20/2014 1036   LYMPHSABS 1.8 01/20/2014 1036   MONOABS 0.5 01/20/2014 1036   EOSABS 0.1 01/20/2014 1036   BASOSABS 0.1 01/20/2014 1036      Chemistry      Component Value Date/Time   NA 140 01/20/2014 1036   K 4.7 01/20/2014 1036   CL 98 01/20/2014 1036   CO2 28 01/20/2014 1036   BUN 17 01/20/2014 1036   CREATININE 0.98 01/20/2014 1036      Component Value Date/Time   CALCIUM 10.1 01/20/2014 1036   ALKPHOS 59 01/20/2014 1036   AST 20 01/20/2014 1036   ALT 24 01/20/2014 1036   BILITOT 0.3 01/20/2014 1036       RADIOGRAPHIC STUDIES:  02/06/2014  CLINICAL DATA: Subsequent treatment strategy for Lung cancer.  EXAM:  NUCLEAR MEDICINE PET SKULL BASE TO THIGH  TECHNIQUE:  9.1 mCi F-18 FDG was injected intravenously. Full-ring PET imaging  was performed from the skull base to thigh after the radiotracer. CT  data was obtained and used for attenuation correction and anatomic  localization.  FASTING BLOOD GLUCOSE: Value: 109 mg/dl  COMPARISON: PET-CT from 08/23/2008. CT chest from 01/23/2014.  FINDINGS:  NECK  No hypermetabolic lymph nodes in the neck.  CHEST  Left lower lobe pulmonary nodule measures 9 mm. This is too small to  reliably characterize by PET-CT. The SUV max associated with this  nodule is equal to 0.67. No hypermetabolic mediastinal or hilar  adenopathy. No hypermetabolic axillary or supraclavicular lymph  nodes. Calcified atherosclerotic disease involves the thoracic aorta  as well as the LAD, RCA and left circumflex coronary arteries.  ABDOMEN/PELVIS  No abnormal hypermetabolic activity within the liver, pancreas,    adrenal glands, or spleen. No hypermetabolic lymph nodes in the  abdomen or pelvis. Atherosclerotic disease involves the abdominal  aorta.  SKELETON  No focal hypermetabolic activity to suggest skeletal metastasis.  IMPRESSION:  1. The left lower lobe pulmonary nodule measures 9 mm and is too  small to reliably characterize by PET-CT. The SUV max associated  with this nodule is below the malignant range equal to 0.67.  However, given the demonstrated interval growth this remains  worrisome for primary bronchogenic carcinoma.  2. Atherosclerotic disease including multi vessel coronary artery  calcifications.  Electronically Signed  By: Kerby Moors M.D.  On: 02/06/2014 08:36    ASSESSMENT:  1. Enlarging left lower lobe pulmonary nodule, below the threshold of evaluation by PET scan.  2. 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 Primary tumor 1.6 cm in size 0/4 positive lymph nodes. Grade 2 cancer. No LVI.  3. Right thorax discomfort secondary to lobectomy procedure, improved  4. Low back pain, chronic, on disability  5. Neurodermatitis lesions on upper back  on right.  6. Seasonal allergies  Patient Active Problem List   Diagnosis Date Noted  . Adenocarcinoma of lung 05/06/2011  . Hypertension 05/06/2011  . COPD (chronic obstructive pulmonary disease) 05/06/2011  . Emphysema 05/06/2011  . Hypercholesterolemia 05/06/2011  . Back fracture      PLAN:  1. I personally reviewed and went over radiographic studies with the patient.  The results are noted within this dictation.   2. Repeat CT of chest without contrast in 3 months 3. Return in 3 months for follow-up   THERAPY PLAN:  Depending on CT scan in 3 months, we will refer the patient to [place] for consideration of SBRT.  All questions were answered. The patient knows to call the clinic with any problems, questions or concerns. We can certainly see the patient much sooner if necessary.  Patient and  plan discussed with Dr. Farrel Gobble and he is in agreement with the aforementioned.   [facility] 02/07/2014

## 2014-02-06 ENCOUNTER — Other Ambulatory Visit (HOSPITAL_COMMUNITY): Payer: Self-pay | Admitting: Oncology

## 2014-02-06 ENCOUNTER — Ambulatory Visit (HOSPITAL_COMMUNITY)
Admission: RE | Admit: 2014-02-06 | Discharge: 2014-02-06 | Disposition: A | Discharge status: Home / Self Care | Payer: Medicare Other | Source: Ambulatory Visit | Attending: Oncology | Admitting: Oncology

## 2014-02-06 DIAGNOSIS — C3490 Malignant neoplasm of unspecified part of unspecified bronchus or lung: Secondary | ICD-10-CM | POA: Insufficient documentation

## 2014-02-06 DIAGNOSIS — I2510 Atherosclerotic heart disease of native coronary artery without angina pectoris: Secondary | ICD-10-CM | POA: Insufficient documentation

## 2014-02-06 DIAGNOSIS — R911 Solitary pulmonary nodule: Secondary | ICD-10-CM | POA: Diagnosis not present

## 2014-02-06 DIAGNOSIS — I700 Atherosclerosis of aorta: Secondary | ICD-10-CM | POA: Insufficient documentation

## 2014-02-06 LAB — GLUCOSE, CAPILLARY: Glucose-Capillary: 109 mg/dL — ABNORMAL HIGH (ref 70–99)

## 2014-02-06 MED ORDER — FLUDEOXYGLUCOSE F - 18 (FDG) INJECTION
9.1000 | Freq: Once | INTRAVENOUS | Status: AC | PRN
  Administered 2014-02-06: 9.1 via INTRAVENOUS

## 2014-02-07 ENCOUNTER — Encounter (HOSPITAL_COMMUNITY): Payer: Medicare Other | Attending: Hematology and Oncology | Admitting: Oncology

## 2014-02-07 VITALS — BP 152/84 | HR 79 | Resp 20 | Wt 161.0 lb

## 2014-02-07 DIAGNOSIS — C3410 Malignant neoplasm of upper lobe, unspecified bronchus or lung: Secondary | ICD-10-CM

## 2014-02-07 DIAGNOSIS — J449 Chronic obstructive pulmonary disease, unspecified: Secondary | ICD-10-CM

## 2014-02-07 DIAGNOSIS — J4489 Other specified chronic obstructive pulmonary disease: Secondary | ICD-10-CM | POA: Diagnosis not present

## 2014-02-07 DIAGNOSIS — I10 Essential (primary) hypertension: Secondary | ICD-10-CM

## 2014-02-07 DIAGNOSIS — C3490 Malignant neoplasm of unspecified part of unspecified bronchus or lung: Secondary | ICD-10-CM

## 2014-02-07 NOTE — Patient Instructions (Signed)
[name] Discharge Instructions  RECOMMENDATIONS MADE BY THE CONSULTANT AND ANY TEST RESULTS WILL BE SENT TO YOUR REFERRING PHYSICIAN.  EXAM FINDINGS BY THE PHYSICIAN TODAY AND SIGNS OR SYMPTOMS TO REPORT TO CLINIC OR PRIMARY PHYSICIAN: Exam and findings as discussed by Robynn Pane, PA-C.  Nodule too small to evaluate at this time.  Will do CT scan of your chest in 3 months to re-evaluate.  Report increased shortness of breath, blood in your sputum, unexplained weight loss, etc.  MEDICATIONS PRESCRIBED:  none  INSTRUCTIONS/FOLLOW-UP: Follow-up in 3 months with scans and office visit.  Thank you for choosing [facility] to provide your oncology and hematology care.  To afford each patient quality time with our providers, please arrive at least 15 minutes before your scheduled appointment time.  With your help, our goal is to use those 15 minutes to complete the necessary work-up to ensure our physicians have the information they need to help with your evaluation and healthcare recommendations.    Effective January 1st, 2014, we ask that you re-schedule your appointment with our physicians should you arrive 10 or more minutes late for your appointment.  We strive to give you quality time with our providers, and arriving late affects you and other patients whose appointments are after yours.    Again, thank you for choosing [facility].  Our hope is that these requests will decrease the amount of time that you wait before being seen by our physicians.       _____________________________________________________________  Should you have questions after your visit to [facility], please contact our office at [phone] between the hours of 8:30 a.m. and 5:00 p.m.  Voicemails left after 4:30 p.m. will not be returned until the following business day.  For prescription refill requests, have your pharmacy contact our office with your  prescription refill request.

## 2014-02-13 DIAGNOSIS — G8929 Other chronic pain: Secondary | ICD-10-CM | POA: Diagnosis not present

## 2014-02-13 DIAGNOSIS — J329 Chronic sinusitis, unspecified: Secondary | ICD-10-CM | POA: Diagnosis not present

## 2014-02-13 DIAGNOSIS — Z6826 Body mass index (BMI) 26.0-26.9, adult: Secondary | ICD-10-CM | POA: Diagnosis not present

## 2014-04-27 DIAGNOSIS — G8929 Other chronic pain: Secondary | ICD-10-CM | POA: Diagnosis not present

## 2014-04-27 DIAGNOSIS — Z6827 Body mass index (BMI) 27.0-27.9, adult: Secondary | ICD-10-CM | POA: Diagnosis not present

## 2014-04-27 DIAGNOSIS — J301 Allergic rhinitis due to pollen: Secondary | ICD-10-CM | POA: Diagnosis not present

## 2014-05-10 ENCOUNTER — Other Ambulatory Visit (HOSPITAL_COMMUNITY): Payer: Medicare Other

## 2014-05-10 ENCOUNTER — Ambulatory Visit (HOSPITAL_COMMUNITY)
Admission: RE | Admit: 2014-05-10 | Discharge: 2014-05-10 | Disposition: A | Discharge status: Home / Self Care | Payer: Medicare Other | Source: Ambulatory Visit | Attending: Oncology | Admitting: Oncology

## 2014-05-10 DIAGNOSIS — Z09 Encounter for follow-up examination after completed treatment for conditions other than malignant neoplasm: Secondary | ICD-10-CM | POA: Insufficient documentation

## 2014-05-10 DIAGNOSIS — J438 Other emphysema: Secondary | ICD-10-CM | POA: Insufficient documentation

## 2014-05-10 DIAGNOSIS — K449 Diaphragmatic hernia without obstruction or gangrene: Secondary | ICD-10-CM | POA: Diagnosis not present

## 2014-05-10 DIAGNOSIS — C3490 Malignant neoplasm of unspecified part of unspecified bronchus or lung: Secondary | ICD-10-CM | POA: Diagnosis not present

## 2014-05-10 DIAGNOSIS — C3430 Malignant neoplasm of lower lobe, unspecified bronchus or lung: Secondary | ICD-10-CM | POA: Diagnosis not present

## 2014-05-10 DIAGNOSIS — R911 Solitary pulmonary nodule: Secondary | ICD-10-CM | POA: Diagnosis not present

## 2014-05-11 ENCOUNTER — Ambulatory Visit (HOSPITAL_COMMUNITY): Payer: Medicare Other | Admitting: Oncology

## 2014-05-12 ENCOUNTER — Encounter (HOSPITAL_COMMUNITY): Payer: Medicare Other | Attending: Hematology and Oncology

## 2014-05-12 ENCOUNTER — Encounter (HOSPITAL_COMMUNITY): Payer: Self-pay

## 2014-05-12 VITALS — BP 131/70 | HR 75 | Temp 98.7°F | Resp 16 | Wt 165.1 lb

## 2014-05-12 DIAGNOSIS — R911 Solitary pulmonary nodule: Secondary | ICD-10-CM

## 2014-05-12 DIAGNOSIS — C3491 Malignant neoplasm of unspecified part of right bronchus or lung: Secondary | ICD-10-CM

## 2014-05-12 DIAGNOSIS — K2100 Gastro-esophageal reflux disease with esophagitis, without bleeding: Secondary | ICD-10-CM

## 2014-05-12 DIAGNOSIS — C3410 Malignant neoplasm of upper lobe, unspecified bronchus or lung: Secondary | ICD-10-CM | POA: Diagnosis not present

## 2014-05-12 DIAGNOSIS — C3432 Malignant neoplasm of lower lobe, left bronchus or lung: Secondary | ICD-10-CM

## 2014-05-12 DIAGNOSIS — J309 Allergic rhinitis, unspecified: Secondary | ICD-10-CM | POA: Diagnosis not present

## 2014-05-12 DIAGNOSIS — C3490 Malignant neoplasm of unspecified part of unspecified bronchus or lung: Secondary | ICD-10-CM | POA: Diagnosis not present

## 2014-05-12 LAB — COMPREHENSIVE METABOLIC PANEL
ALT: 25 U/L (ref 0–53)
AST: 19 U/L (ref 0–37)
Albumin: 4.2 g/dL (ref 3.5–5.2)
Alkaline Phosphatase: 67 U/L (ref 39–117)
Anion gap: 13 (ref 5–15)
BUN: 18 mg/dL (ref 6–23)
CALCIUM: 9.6 mg/dL (ref 8.4–10.5)
CO2: 30 mEq/L (ref 19–32)
Chloride: 97 mEq/L (ref 96–112)
Creatinine, Ser: 0.87 mg/dL (ref 0.50–1.35)
GFR, EST NON AFRICAN AMERICAN: 87 mL/min — AB (ref >90)
GLUCOSE: 97 mg/dL (ref 70–99)
Potassium: 4 mEq/L (ref 3.7–5.3)
Sodium: 140 mEq/L (ref 137–147)
TOTAL PROTEIN: 7.6 g/dL (ref 6.0–8.3)
Total Bilirubin: 0.2 mg/dL — ABNORMAL LOW (ref 0.3–1.2)

## 2014-05-12 LAB — CBC WITH DIFFERENTIAL/PLATELET
BASOS PCT: 1 % (ref 0–1)
Basophils Absolute: 0 10*3/uL (ref 0.0–0.1)
EOS ABS: 0.1 10*3/uL (ref 0.0–0.7)
Eosinophils Relative: 2 % (ref 0–5)
HCT: 40.6 % (ref 39.0–52.0)
Hemoglobin: 14 g/dL (ref 13.0–17.0)
LYMPHS ABS: 1.7 10*3/uL (ref 0.7–4.0)
Lymphocytes Relative: 23 % (ref 12–46)
MCH: 33.9 pg (ref 26.0–34.0)
MCHC: 34.5 g/dL (ref 30.0–36.0)
MCV: 98.3 fL (ref 78.0–100.0)
Monocytes Absolute: 0.6 10*3/uL (ref 0.1–1.0)
Monocytes Relative: 8 % (ref 3–12)
NEUTROS PCT: 66 % (ref 43–77)
Neutro Abs: 4.9 10*3/uL (ref 1.7–7.7)
Platelets: 292 10*3/uL (ref 150–400)
RBC: 4.13 MIL/uL — AB (ref 4.22–5.81)
RDW: 12 % (ref 11.5–15.5)
WBC: 7.4 10*3/uL (ref 4.0–10.5)

## 2014-05-12 LAB — LACTATE DEHYDROGENASE: LDH: 214 U/L (ref 94–250)

## 2014-05-12 MED ORDER — METOCLOPRAMIDE HCL 10 MG PO TABS: 10.0000 mg | ORAL_TABLET | Freq: Three times a day (TID) | ORAL | Status: DC

## 2014-05-12 NOTE — Progress Notes (Signed)
Maurice Orozco presented for [employer]. Labs per MD order drawn via Peripheral Line 23 gauge needle inserted in right  AC  Good blood return present. Procedure without incident.  Needle removed intact. Patient tolerated procedure well.

## 2014-05-12 NOTE — Patient Instructions (Signed)
[facility] Discharge Instructions  RECOMMENDATIONS MADE BY THE CONSULTANT AND ANY TEST RESULTS WILL BE SENT TO YOUR REFERRING PHYSICIAN.  EXAM FINDINGS BY THE PHYSICIAN TODAY AND SIGNS OR SYMPTOMS TO REPORT TO CLINIC OR PRIMARY PHYSICIAN: Exam and findings as discussed by Dr. Barnet Glasgow. Will make a referral to Dr. Laural Golden for your reflux.  His office will contact you with an appointment.  Need PET scan to be done, then want you to be seen by Dr. Lanell Persons for Radiation consult.  MEDICATIONS PRESCRIBED:  Metoclopramide - take as directed Flonase - use at bedtime  INSTRUCTIONS/FOLLOW-UP: Consult with Dr. Laural Golden PET scan - nothing to eat or drink 6 hours prior to scan, no sugar, hard candy, mints, chewing gum, etc. Consult with Dr. Isidore Moos at [facility] here with labs and office visit in 2 months.  Thank you for choosing [facility] to provide your oncology and hematology care.  To afford each patient quality time with our providers, please arrive at least 15 minutes before your scheduled appointment time.  With your help, our goal is to use those 15 minutes to complete the necessary work-up to ensure our physicians have the information they need to help with your evaluation and healthcare recommendations.    Effective January 1st, 2014, we ask that you re-schedule your appointment with our physicians should you arrive 10 or more minutes late for your appointment.  We strive to give you quality time with our providers, and arriving late affects you and other patients whose appointments are after yours.    Again, thank you for choosing [facility].  Our hope is that these requests will decrease the amount of time that you wait before being seen by our physicians.       _____________________________________________________________  Should you have questions after your visit to [facility], please contact our  office at [phone] between the hours of 8:30 a.m. and 4:30 p.m.  Voicemails left after 4:30 p.m. will not be returned until the following business day.  For prescription refill requests, have your pharmacy contact our office with your prescription refill request.    _______________________________________________________________  We hope that we have given you very good care.  You may receive a patient satisfaction survey in the mail, please complete it and return it as soon as possible.  We value your feedback!  _______________________________________________________________  Have you asked about our STAR program?  STAR stands for Survivorship Training and Rehabilitation, and this is a nationally recognized cancer care program that focuses on survivorship and rehabilitation.  Cancer and cancer treatments may cause problems, such as, pain, making you feel tired and keeping you from doing the things that you need or want to do. Cancer rehabilitation can help. Our goal is to reduce these troubling effects and help you have the best quality of life possible.  You may receive a survey from a nurse that asks questions about your current state of health.  Based on the survey results, all eligible patients will be referred to the [facility] program for an evaluation so we can better serve you!  A frequently asked questions sheet is available upon request.

## 2014-05-12 NOTE — Progress Notes (Signed)
Maurice Orozco  OFFICE PROGRESS NOTE  Maurice Grills, MD [place] [address]  DIAGNOSIS: Adenocarcinoma of lung, right - Plan: Comprehensive metabolic panel, CBC with Differential, Lactate dehydrogenase, NM PET Image Restag (PS) Skull Base To Thigh, Consult to radiation oncology, Comprehensive metabolic panel, CBC with Differential, Lactate dehydrogenase  Cancer of lower lobe of left lung  Gastroesophageal reflux disease with esophagitis - Plan: Consult to gastroenterology  Chief Complaint  Patient presents with  . Lung Cancer    CURRENT THERAPY: Surveillance per NCCN guidelines with work-up for an enlarging pulmonary lesion in left lower lobe with PET scan. Repeat CT scan was done on 05/10/2014 and he is here today to discuss the results.     INTERVAL HISTORY: Maurice Orozco 68 y.o. male returns for followup of an enlarging pulmonary nodule requiring close CT interval surveillance and now most recently a PET scan.  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 Primary tumor 1.6 cm in size 0/4 positive lymph nodes. Grade 2 cancer. No LVI. ; Primary problem has been severe reflux with vomiting despite taking 8 Tagamet tablets per day. He denies any hematemesis, melena, epistaxis, or hemoptysis. He's experience nasal drip with cough and expectoration of greenish sputum. He denies any fever or chills. He does have Flonase at home but is not using it.      MEDICAL HISTORY: Past Medical History  Diagnosis Date  . Cancer     rt lung/dx 2011/surg only  . Hypertension   . Lung cancer   . COPD (chronic obstructive pulmonary disease)   . High cholesterol   . Chronic back pain   . Pneumonia due to Streptococcus     HX. POST OPERATIVELY  . Back fracture     DUE TO AA IN 1970  . Stress fx pelvis     DUE TO AA IN 1970  . Adenocarcinoma of lung 05/06/2011  . Hypertension 05/06/2011  . COPD (chronic  obstructive pulmonary disease) 05/06/2011  . Emphysema 05/06/2011  . Hypercholesterolemia 05/06/2011    INTERIM HISTORY: has Back fracture; Adenocarcinoma of lung; Hypertension; COPD (chronic obstructive pulmonary disease); Emphysema; and Hypercholesterolemia on his problem list.   followup of an enlarging pulmonary nodule requiring close CT interval surveillance and now most recently a PET scan.  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 Primary tumor 1.6 cm in size 0/4 positive lymph nodes. Grade 2 cancer. No LVI.      Adenocarcinoma of lung    09/30/2010  Cancer Staging  CT of chest showed spiculated lesion    10/15/2010  Surgery  Right upper lobectomy by Dr. Arlyce Dice    10/16/2010  Remission     10/24/2013  Imaging  CT Chest- Enlarging subpleural nodule in the left lower lobe, now 9 mm, with distortion of the overlying pleura. Finding is concerning for a small bronchogenic carcinoma    01/23/2014  Imaging  CT Chest- Left lower lobe nodule has enlarged from 11/01/2012 and is worrisome for primary bronchogenic carcinoma.       ALLERGIES:  is allergic to fentanyl and aspirin.  MEDICATIONS: has a current medication list which includes the following prescription(s): albuterol, alprazolam, cimetidine, hydrochlorothiazide, hydrocodone-acetaminophen, simvastatin, fluticasone, ibuprofen-diphenhydramine cit, and metoclopramide.  SURGICAL HISTORY:  Past Surgical History  Procedure Laterality Date  . Back surgery    . Lung lobectomy  2012    RT UPPER LOBE  FAMILY HISTORY: family history includes Cancer in his maternal uncle, maternal uncle, maternal uncle, and maternal uncle; Heart attack in his brother; Hypertension in his mother; Kidney disease in his father.  SOCIAL HISTORY:  reports that he quit smoking about 3 years ago. His smoking use included Cigarettes. He smoked 0.00 packs per day. His smokeless tobacco use includes Chew. He reports that he does not drink  alcohol.  REVIEW OF SYSTEMS:  Other than that discussed above is noncontributory.  PHYSICAL EXAMINATION: ECOG PERFORMANCE STATUS: 1 - Symptomatic but completely ambulatory  Blood pressure 131/70, pulse 75, temperature 98.7 F (37.1 C), temperature source Oral, resp. rate 16, weight 165 lb 1.6 oz (74.889 kg), SpO2 98.00%.  GENERAL:alert, no distress and comfortable SKIN: skin color, texture, turgor are normal, no rashes or significant lesions EYES: PERLA; Conjunctiva are pink and non-injected, sclera clear SINUSES: No redness or tenderness over maxillary or ethmoid sinuses. Bilateral rhinorrhea. OROPHARYNX: Posterior exudate, no erythema on lips, buccal mucosa, or tongue. NECK: supple, thyroid normal size, non-tender, without nodularity. No masses CHEST: Increased AP diameter with well-healed surgical scar. No breast masses. LYMPH:  no palpable lymphadenopathy in the cervical, axillary or inguinal LUNGS: clear to auscultation and percussion with normal breathing effort HEART: regular rate & rhythm and no murmurs. ABDOMEN:abdomen soft, non-tender and normal bowel sounds MUSCULOSKELETAL:no cyanosis of digits and no clubbing. Range of motion normal.  NEURO: alert & oriented x 3 with fluent speech, no focal motor/sensory deficits   LABORATORY DATA: Office Visit on 05/12/2014  Component Date Value Ref Range Status  . WBC 05/12/2014 7.4  4.0 - 10.5 K/uL Final  . RBC 05/12/2014 4.13* 4.22 - 5.81 MIL/uL Final  . Hemoglobin 05/12/2014 14.0  13.0 - 17.0 g/dL Final  . HCT 05/12/2014 40.6  39.0 - 52.0 % Final  . MCV 05/12/2014 98.3  78.0 - 100.0 fL Final  . MCH 05/12/2014 33.9  26.0 - 34.0 pg Final  . MCHC 05/12/2014 34.5  30.0 - 36.0 g/dL Final  . RDW 05/12/2014 12.0  11.5 - 15.5 % Final  . Platelets 05/12/2014 292  150 - 400 K/uL Final  . Neutrophils Relative % 05/12/2014 66  43 - 77 % Final  . Neutro Abs 05/12/2014 4.9  1.7 - 7.7 K/uL Final  . Lymphocytes Relative 05/12/2014 23  12 - 46  % Final  . Lymphs Abs 05/12/2014 1.7  0.7 - 4.0 K/uL Final  . Monocytes Relative 05/12/2014 8  3 - 12 % Final  . Monocytes Absolute 05/12/2014 0.6  0.1 - 1.0 K/uL Final  . Eosinophils Relative 05/12/2014 2  0 - 5 % Final  . Eosinophils Absolute 05/12/2014 0.1  0.0 - 0.7 K/uL Final  . Basophils Relative 05/12/2014 1  0 - 1 % Final  . Basophils Absolute 05/12/2014 0.0  0.0 - 0.1 K/uL Final    PATHOLOGY: No new pathology.  Urinalysis    Component Value Date/Time   COLORURINE YELLOW 10/15/2010 0646   APPEARANCEUR CLEAR 10/15/2010 0646   LABSPEC 1.023 10/15/2010 0646   PHURINE 5.5 10/15/2010 0646   HGBUR NEGATIVE 10/15/2010 0646   BILIRUBINUR NEGATIVE 10/15/2010 0646   KETONESUR NEGATIVE 10/15/2010 0646   PROTEINUR NEGATIVE 10/15/2010 0646   UROBILINOGEN 0.2 10/15/2010 0646   NITRITE NEGATIVE 10/15/2010 0646   LEUKOCYTESUR NEGATIVE MICROSCOPIC NOT DONE ON URINES WITH NEGATIVE PROTEIN, BLOOD, LEUKOCYTES, NITRITE, OR GLUCOSE <1000 mg/dL. 10/15/2010 0646    RADIOGRAPHIC STUDIES: Ct Chest Wo Contrast  05/10/2014   CLINICAL DATA:  Followup  lung carcinoma and left lower lobe pulmonary nodule.  EXAM: CT CHEST WITHOUT CONTRAST  TECHNIQUE: Multidetector CT imaging of the chest was performed following the standard protocol without IV contrast.  COMPARISON:  CT on 01/23/14 and 10/24/2013  FINDINGS: Mediastinum/Hilar Regions: No masses or pathologically enlarged lymph nodes identified. Moderate size hiatal hernia again noted.  Other Thoracic Lymphadenopathy:  None.  Lungs: Pulmonary nodule in the left lower lobe currently measures 11 mm on image 38 compared with 9 mm and 8 mm on prior studies. This is suspicious for bronchogenic carcinoma.  Tiny 3 mm pulmonary nodule in the left lung apex on image 10 is stable. Prior right upper lobectomy again demonstrated. New reticulonodular opacities seen in inferior aspect of the right middle lobe, likely inflammatory or infectious in etiology. No evidence of pulmonary consolidation or  central endobronchial obstruction. Mild to moderate emphysema again noted.  Pleura:  No evidence of effusion or mass.  Vascular/Cardiac: No thoracic aortic aneurysm or other significant abnormality identified.  Musculoskeletal:  No suspicious bone lesions identified.  Other:  None.  IMPRESSION: Continued enlargement of 11 mm pulmonary nodule in the left lower lobe, suspicious for bronchogenic carcinoma.  No evidence of metastatic disease within the thorax.  New reticular nodular opacities in the right middle lobe, consistent with mild infectious or inflammatory etiology.  Mild to moderate emphysema.  Moderate hiatal hernia.   Electronically Signed   By: Earle Gell M.D.   On: 05/10/2014 14:03    ASSESSMENT:  #1. Enlarging left lower lobe nodule, highly suspicious for malignancy. #2. Stage I-A adenocarcinoma of the right upper lobe, status post right upper lobectomy in 10/15/2010, 4 lymph nodes all negative, grade 2 without lymphovascular invasion, no postop treatment. #3. Severe gastroesophageal reflux disease, despite high doses of Tagamet. #4. Allergic rhinitis, symptomatic.    PLAN:  #1. Repeat PET scan in anticipation of radiotherapy consultation for SBRT 2toleft lower lobe nodule. #2. Metoclopramide 10 mg 3 times a day and at bedtime. Continue Tagamet. #3. Referral to Dr. Laural Golden on for upper endoscopy to rule out Barrett's esophagus. #4. Referral to Dr. Isidore Moos for SBRT 2 left lower lobe nodule. #5. Office visit in 2 months with CBC, chem profile, LDH.   All questions were answered. The patient knows to call the clinic with any problems, questions or concerns. We can certainly see the patient much sooner if necessary.   I spent 30 minutes counseling the patient face to face. The total time spent in the appointment was 40 minutes.    Doroteo Bradford, MD 05/12/2014 12:55 PM  DISCLAIMER:  This note was dictated with voice recognition software.  Similar sounding words can inadvertently  be transcribed inaccurately and may not be corrected upon review.

## 2014-05-31 ENCOUNTER — Ambulatory Visit: Payer: Medicare Other

## 2014-05-31 ENCOUNTER — Encounter (INDEPENDENT_AMBULATORY_CARE_PROVIDER_SITE_OTHER): Payer: Self-pay | Admitting: *Deleted

## 2014-05-31 ENCOUNTER — Ambulatory Visit: Payer: Medicare Other | Admitting: Radiation Oncology

## 2014-06-02 ENCOUNTER — Ambulatory Visit (HOSPITAL_COMMUNITY)
Admission: RE | Admit: 2014-06-02 | Discharge: 2014-06-02 | Disposition: A | Discharge status: Home / Self Care | Payer: Medicare Other | Source: Ambulatory Visit | Attending: Hematology and Oncology | Admitting: Hematology and Oncology

## 2014-06-02 DIAGNOSIS — R7309 Other abnormal glucose: Secondary | ICD-10-CM | POA: Insufficient documentation

## 2014-06-02 DIAGNOSIS — C3490 Malignant neoplasm of unspecified part of unspecified bronchus or lung: Secondary | ICD-10-CM | POA: Diagnosis not present

## 2014-06-02 DIAGNOSIS — C3430 Malignant neoplasm of lower lobe, unspecified bronchus or lung: Secondary | ICD-10-CM | POA: Diagnosis not present

## 2014-06-02 DIAGNOSIS — C3491 Malignant neoplasm of unspecified part of right bronchus or lung: Secondary | ICD-10-CM

## 2014-06-02 LAB — GLUCOSE, CAPILLARY: GLUCOSE-CAPILLARY: 104 mg/dL — AB (ref 70–99)

## 2014-06-02 MED ORDER — FLUDEOXYGLUCOSE F - 18 (FDG) INJECTION
8.2000 | Freq: Once | INTRAVENOUS | Status: AC | PRN
  Administered 2014-06-02: 8.2 via INTRAVENOUS

## 2014-06-15 NOTE — Progress Notes (Signed)
Thoracic Location of Tumor / Histology: Right upper lobe lung cancer   Patient presented to Dr. Barnet Glasgow 05/12/14 to review his 05/10/14 CT Scan:   CT on 05/10/14: Mediastinum/Hilar Regions: No masses or pathologically enlarged lymph nodes identified. Moderate size hiatal hernia again noted. Other Thoracic Lymphadenopathy: None. Lungs: Pulmonary nodule in the left lower lobe currently measures 11 mm on image 38 compared with 9 mm and 8 mm on prior studies. This is suspicious for bronchogenic carcinoma. IMPRESSION: Continued enlargement of 11 mm pulmonary nodule in the left lower lobe, suspicious for bronchogenic carcinoma. No evidence of metastatic disease within the thorax. New reticular nodular opacities in the right middle lobe, consistent with mild infectious or inflammatory etiology. Mild to moderate emphysema. Moderate hiatal hernia  PET Scan 06/02/14: Left lower lobe pulmonary nodule measures 10 mm (image 78, series 4) increased from 8 mm on comparison PET-CT scan and 4 mm on CT of 11/01/12. The lesion has mild associated metabolic activity with SUV max = 1.2 increased from SUV max equal 0.7. No hypermetabolic mediastinal lymph nodes.   Primary problems has been severe reflux with vomiting despite taking 8 Tagamet tablets per day. He denies any hematemesis, melena, epistaxis, or hemoptysis. He's experience nasal drip with cough and expectoration of greenish sputum. He denies any fever or chills. He does have Flonase at home but is not using it.      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 Primary tumor 1.6 cm in size 0/4 positive lymph nodes. Grade 2 cancer. No LVI.   Tobacco/Marijuana/Snuff/ETOH use: Quit smoking about 3 years ago. His smoking use included Cigarettes. He smoked 0.00 packs per day. His smokeless tobacco use includes Chew. He reports that he does not drink  alcohol.   Past/Anticipated interventions by cardiothoracic surgery, if any:  Unknown  Past/Anticipated interventions by medical oncology, if any: Surveillance post surgery in 2012.  Referral to Dr. Isidore Moos for SBRT 2 left lower lobe nodule   Signs/Symptoms  Weight changes, if any: No as of 05/12/14  Respiratory complaints, if any: sob with moderate exertion at times,   Hemoptysis, if any: No  Pain issues, if any:  No  SAFETY ISSUES:  Prior radiation? No  Pacemaker/ICD? No  Possible current pregnancy?N/A  Is the patient on methotrexate?   Current Complaints / other details:  Married, 3 children, no family hx cancer in family

## 2014-06-16 ENCOUNTER — Ambulatory Visit: Payer: Medicare Other | Admitting: Radiation Oncology

## 2014-06-16 ENCOUNTER — Ambulatory Visit
Admission: RE | Admit: 2014-06-16 | Discharge: 2014-06-16 | Disposition: A | Discharge status: Home / Self Care | Payer: Medicare Other | Source: Ambulatory Visit | Attending: Radiation Oncology | Admitting: Radiation Oncology

## 2014-06-16 ENCOUNTER — Ambulatory Visit: Payer: Medicare Other

## 2014-06-16 ENCOUNTER — Telehealth: Payer: Self-pay | Admitting: *Deleted

## 2014-06-16 ENCOUNTER — Encounter: Payer: Self-pay | Admitting: Radiation Oncology

## 2014-06-16 VITALS — BP 136/60 | HR 91 | Temp 97.7°F | Resp 20 | Ht 65.0 in | Wt 164.5 lb

## 2014-06-16 DIAGNOSIS — R1110 Vomiting, unspecified: Secondary | ICD-10-CM | POA: Diagnosis not present

## 2014-06-16 DIAGNOSIS — C3432 Malignant neoplasm of lower lobe, left bronchus or lung: Secondary | ICD-10-CM | POA: Insufficient documentation

## 2014-06-16 DIAGNOSIS — Z510 Encounter for antineoplastic radiation therapy: Secondary | ICD-10-CM | POA: Diagnosis not present

## 2014-06-16 DIAGNOSIS — R05 Cough: Secondary | ICD-10-CM | POA: Insufficient documentation

## 2014-06-16 DIAGNOSIS — K219 Gastro-esophageal reflux disease without esophagitis: Secondary | ICD-10-CM | POA: Diagnosis not present

## 2014-06-16 DIAGNOSIS — M549 Dorsalgia, unspecified: Secondary | ICD-10-CM | POA: Diagnosis not present

## 2014-06-16 DIAGNOSIS — R911 Solitary pulmonary nodule: Secondary | ICD-10-CM

## 2014-06-16 DIAGNOSIS — F17220 Nicotine dependence, chewing tobacco, uncomplicated: Secondary | ICD-10-CM | POA: Diagnosis not present

## 2014-06-16 DIAGNOSIS — C3491 Malignant neoplasm of unspecified part of right bronchus or lung: Secondary | ICD-10-CM

## 2014-06-16 HISTORY — DX: Anxiety disorder, unspecified: F41.9

## 2014-06-16 HISTORY — DX: Unspecified osteoarthritis, unspecified site: M19.90

## 2014-06-16 HISTORY — DX: Gastro-esophageal reflux disease without esophagitis: K21.9

## 2014-06-16 HISTORY — DX: Allergy, unspecified, initial encounter: T78.40XA

## 2014-06-16 MED ORDER — AZITHROMYCIN 250 MG PO TABS: ORAL_TABLET | ORAL | Status: DC

## 2014-06-16 NOTE — Telephone Encounter (Signed)
Called patient to ask question, lvm for a return call

## 2014-06-16 NOTE — Progress Notes (Signed)
Please see the Nurse Progress Note in the MD Initial Consult Encounter for this patient. 

## 2014-06-18 ENCOUNTER — Encounter: Payer: Self-pay | Admitting: Radiation Oncology

## 2014-06-18 DIAGNOSIS — C3432 Malignant neoplasm of lower lobe, left bronchus or lung: Secondary | ICD-10-CM | POA: Insufficient documentation

## 2014-06-18 NOTE — Progress Notes (Signed)
Radiation Oncology         [phone] ________________________________  Initial outpatient Consultation  Name: Maurice Orozco MRN: [id number]  Date: 06/16/2014  DOB: [birth year]  EL:FYBOFBP,ZWCHENI M, MD  Farrel Gobble, MD; [facility]  REFERRING PHYSICIAN: Farrel Gobble, MD  DIAGNOSIS:  162.5 / C34.32 (left lower lobe nodule suspicious for primary lung carcinoma, T1aN0M0 Stage IA)   HISTORY OF PRESENT ILLNESS::Maurice Orozco is a 68 y.o. male who presented with lung adenocarcinoma 3+ years ago. He underwent RUL lobectomy on 10/15/10 by Dr Arlyce Dice.  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  No adjuvant therapy.  He is a former smoker, currently chews tobacco.   He has a growing left lower lobe nodule that appears suspicious for a new lung cancer. I've reviewed his imaging, and it can be seen back to his Feb 2014 CT imaging (15mm then.).  On PET, it is 85mm with SUV of 1.2.  Note that with a small size, SUV uptake can be relatively low, even if it is malignant.   No other sites of disease.  Quit smoking 3 years ago.  Asymptomatic. No weight loss, no change in baseline SOB with exertion.  PREVIOUS RADIATION THERAPY: No  PAST MEDICAL HISTORY:  has a past medical history of Cancer; Hypertension; Lung cancer; COPD (chronic obstructive pulmonary disease); High cholesterol; Chronic back pain; Pneumonia due to Streptococcus; Back fracture; Stress fx pelvis; Adenocarcinoma of lung (05/06/2011); Hypertension (05/06/2011); COPD (chronic obstructive pulmonary disease) (05/06/2011); Emphysema (05/06/2011); Hypercholesterolemia (05/06/2011); Allergy; Anxiety; Arthritis; Emphysema of lung; and GERD (gastroesophageal reflux disease).    PAST SURGICAL HISTORY: Past Surgical History  Procedure Laterality Date  . Back surgery    . Lung lobectomy  2012    RT UPPER LOBE  . Back surgery  2011    metal plate l-4,L5    FAMILY HISTORY: family  history includes Cancer in his maternal uncle, maternal uncle, maternal uncle, and maternal uncle; Heart attack in his brother; Hypertension in his mother; Kidney disease in his father.  SOCIAL HISTORY:  reports that he quit smoking about 3 years ago. His smoking use included Cigarettes. He smoked 0.00 packs per day. His smokeless tobacco use includes Chew. He reports that he does not drink alcohol.  ALLERGIES: Fentanyl and Aspirin  MEDICATIONS:  Current Outpatient Prescriptions  Medication Sig Dispense Refill  . albuterol (PROVENTIL HFA;VENTOLIN HFA) 108 (90 BASE) MCG/ACT inhaler Inhale 2 puffs into the lungs every 6 (six) hours as needed.        . ALPRAZolam (XANAX) 0.5 MG tablet Take 0.5 mg by mouth at bedtime as needed for anxiety.      . Cimetidine (TAGAMET PO) Take 1 tablet by mouth as needed. Taking about 8 daily      . fluticasone (FLONASE) 50 MCG/ACT nasal spray Place 2 sprays into the nose daily.        . hydrochlorothiazide 25 MG tablet Take 25 mg by mouth daily. TAKES 1/2 TABLET DAILY       . HYDROcodone-acetaminophen (NORCO) 10-325 MG per tablet Take 1 tablet by mouth every 6 (six) hours as needed. TAKES 1 - 2 EVERY 4 HOURS FOR BACK PAIN       . ibuprofen (ADVIL,MOTRIN) 200 MG tablet Take 200 mg by mouth every 8 (eight) hours as needed.      . Ibuprofen-Diphenhydramine Cit (ADVIL PM PO) Take 1 tablet by mouth at bedtime as needed.      Marland Kitchen  simvastatin (ZOCOR) 20 MG tablet Take 20 mg by mouth at bedtime.        Marland Kitchen azithromycin (ZITHROMAX) 250 MG tablet Take 2 tablets today, then 1 tablet PO daily for 4 more days  6 each  0   No current facility-administered medications for this encounter.    REVIEW OF SYSTEMS:  Notable for that above.   PHYSICAL EXAM:  height is 5\' 5"  (1.651 m) and weight is 164 lb 8 oz (74.617 kg). His oral temperature is 97.7 F (36.5 C). His blood pressure is 136/60 and his pulse is 91. His respiration is 20 and oxygen saturation is 99%.   General: Alert and  oriented, in no acute distress HEENT: Head is normocephalic. Extraocular movements are intact. Oropharynx is clear. Neck: Neck is supple, no palpable cervical or supraclavicular lymphadenopathy. Heart: Regular in rate and rhythm with no murmurs, rubs, or gallops. Chest: Clear to auscultation bilaterally, with no rhonchi, wheezes, or rales. Abdomen: Soft, nontender, nondistended, with no rigidity or guarding. Extremities: No cyanosis or edema. Lymphatics: see neck Skin: No concerning lesions. Musculoskeletal: symmetric strength and muscle tone throughout. Neurologic: Cranial nerves II through XII are grossly intact. No obvious focalities. Speech is fluent. Coordination is intact. Psychiatric: Judgment and insight are intact. Affect is appropriate.   ECOG = 0  0 - Asymptomatic (Fully active, able to carry on all predisease activities without restriction)  1 - Symptomatic but completely ambulatory (Restricted in physically strenuous activity but ambulatory and able to carry out work of a light or sedentary nature. For example, light housework, office work)  2 - Symptomatic, <50% in bed during the day (Ambulatory and capable of all self care but unable to carry out any work activities. Up and about more than 50% of waking hours)  3 - Symptomatic, >50% in bed, but not bedbound (Capable of only limited self-care, confined to bed or chair 50% or more of waking hours)  4 - Bedbound (Completely disabled. Cannot carry on any self-care. Totally confined to bed or chair)  5 - Death   Eustace Pen MM, Creech RH, Tormey DC, et al. [phone]). "Toxicity and response criteria of the [facility]". [place] Oncol. 5 (6): 649-55   LABORATORY DATA:  Lab Results  Component Value Date   WBC 7.4 05/12/2014   HGB 14.0 05/12/2014   HCT 40.6 05/12/2014   MCV 98.3 05/12/2014   PLT 292 05/12/2014   CMP     Component Value Date/Time   NA 140 05/12/2014 1225   K 4.0 05/12/2014 1225   CL 97 05/12/2014  1225   CO2 30 05/12/2014 1225   GLUCOSE 97 05/12/2014 1225   BUN 18 05/12/2014 1225   CREATININE 0.87 05/12/2014 1225   CALCIUM 9.6 05/12/2014 1225   PROT 7.6 05/12/2014 1225   ALBUMIN 4.2 05/12/2014 1225   AST 19 05/12/2014 1225   ALT 25 05/12/2014 1225   ALKPHOS 67 05/12/2014 1225   BILITOT 0.2* 05/12/2014 1225   GFRNONAA 87* 05/12/2014 1225   GFRAA >90 05/12/2014 1225         RADIOGRAPHY: Nm Pet Image Restag (ps) Skull Base To Thigh  06/02/2014   CLINICAL DATA:  Subsequent treatment strategy for lung carcinoma. Enlarging left lower lobe nodule.  EXAM: NUCLEAR MEDICINE PET SKULL BASE TO THIGH  TECHNIQUE: 8.2 mCi F-18 FDG was injected intravenously. Full-ring PET imaging was performed from the skull base to thigh after the radiotracer. CT data was obtained and used for attenuation  correction and anatomic localization.  FASTING BLOOD GLUCOSE:  Value: 104 mg/dl  COMPARISON:  PET-CT 02/06/2014, CT 05/10/2014, CT 11/01/2012  FINDINGS: NECK  No hypermetabolic lymph nodes in the neck.  CHEST  Left lower lobe pulmonary nodule measures 10 mm (image 78, series 4) increased from 8 mm on comparison PET-CT scan and 4 mm on CT of 11/01/12. The lesion has mild associated metabolic activity with SUV max = 1.2 increased from SUV max equal 0.7.  No hypermetabolic mediastinal lymph nodes.  ABDOMEN/PELVIS  No abnormal hypermetabolic activity within the liver, pancreas, adrenal glands, or spleen. No hypermetabolic lymph nodes in the abdomen or pelvis.  SKELETON  No focal hypermetabolic activity to suggest skeletal metastasis.  IMPRESSION: Enlarging hypermetabolic left lower lobe pulmonary nodules concerning for bronchogenic carcinoma.  Stage by  FDG PET imaging T1a N0 M0.   Electronically Signed   By: Suzy Bouchard M.D.   On: 06/02/2014 12:08      IMPRESSION/PLAN: Lovely 68 yo man with prior Stage IA RUL lung cancer, resected in 2012. Now a growing LLL nodule, suspicious for new primary.   I spoke with the patient and his wife about  potential approaches at this point forward:   1) biopsy this nodule to prove it is cancer.  However, this is associated with biopsy-related risks  2) Pursue treatment without pathology.  The patient understands there is a very small chance this is not cancer, and could undergo treatment in spite of this.  SBRT without biopsy is probably the least invasive treatment option, particularly compared to surgery, with a 90%+ control rate.   3) Continue to observe  He is interested in a pursuing a minimally invasive treatment option without biopsy. He is not interested in surgery.  He does report some subacute sinus pain/ pressure which is not necessarily a sinus infection; it is also unlikely that the nodule in the LLL is an infection. However, I will give him a Rx for Azithromycin which should address either in the off-chance they are from a bacterial etiology.   In about 2 weeks, we will simulate him for SBRT to the LLL.  A consent form has been signed and placed in his chart for this. He is unlikely to have any significant side effects other than some fatigue. We spoke about the risk of rare side effects such as lung damage or rare or internal injuries to organs.   Anticipate he will receive 3 treatments. He is enthusiastic about this plan.  Will order baseline PFTs.  The patient continues to use/chew  tobacco. The patient was counseled to stop using tobacco and was offered pharmacotherapy and further counseling to help with this. The patient declined pharmacotherapy and further counseling at this time.  __________________________________________   Eppie Gibson, MD

## 2014-06-19 ENCOUNTER — Telehealth: Payer: Self-pay | Admitting: *Deleted

## 2014-06-19 NOTE — Telephone Encounter (Signed)
CALLED PATIENT TO INFORM OF PFT ON 06-27-14 - ARRIVAL TIME - 10:45 AM @ [facility]MISS[facility], SPOKE WITH PATIENT AND HE IS AWARE OF THIS TEST

## 2014-06-21 ENCOUNTER — Encounter (INDEPENDENT_AMBULATORY_CARE_PROVIDER_SITE_OTHER): Payer: Self-pay | Admitting: Internal Medicine

## 2014-06-21 ENCOUNTER — Ambulatory Visit (INDEPENDENT_AMBULATORY_CARE_PROVIDER_SITE_OTHER): Payer: Medicare Other | Admitting: Internal Medicine

## 2014-06-21 VITALS — BP 102/46 | HR 72 | Temp 98.1°F | Ht 66.0 in | Wt 162.1 lb

## 2014-06-21 DIAGNOSIS — K219 Gastro-esophageal reflux disease without esophagitis: Secondary | ICD-10-CM | POA: Insufficient documentation

## 2014-06-21 DIAGNOSIS — R110 Nausea: Secondary | ICD-10-CM | POA: Diagnosis not present

## 2014-06-21 NOTE — Progress Notes (Signed)
Subjective:    Patient ID: Maurice Orozco, male    DOB: [birth year], 68 y.o.   MRN: [id number]  HPI Referred to our office by Dr. Tilden Fossa for severe reflux with vomiting. He tells me his stomach burned after eating. He says he started the Protonix x 3 days and he says his pain resolved. Started the Protonix a month a ago. He says he has not had any more epigastric pain. If he becomes hungry he becomes nauseated.  Stopped the Advil PM 1 week. Was taking every night to sleep (two a night). Since stopping the Advil he has not had any nausea Appetite is good. No weight loss. No abdominal pain. He has a BM x 1 a day. No melena or BRRB.Marland Kitchen    Hx of Enlarging left lower lobe nodule, highly suspicious for malignancy.  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     .      Review of Systems Past Medical History  Diagnosis Date  . Cancer     rt lung/dx 2011/surg only  . Hypertension   . Lung cancer   . COPD (chronic obstructive pulmonary disease)   . High cholesterol   . Chronic back pain   . Pneumonia due to Streptococcus     HX. POST OPERATIVELY  . Back fracture     DUE TO AA IN 1970  . Stress fx pelvis     DUE TO AA IN 1970  . Adenocarcinoma of lung 05/06/2011  . Hypertension 05/06/2011  . COPD (chronic obstructive pulmonary disease) 05/06/2011  . Emphysema 05/06/2011  . Hypercholesterolemia 05/06/2011  . Allergy   . Anxiety   . Arthritis     finger  . Emphysema of lung   . GERD (gastroesophageal reflux disease)     Past Surgical History  Procedure Laterality Date  . Back surgery    . Lung lobectomy  2012    RT UPPER LOBE  . Back surgery  2011    metal plate l-4,L5    Allergies  Allergen Reactions  . Fentanyl Other (See Comments)    CAUSED HALLUCINATIONS/05-03-13 pt reports it was oral fentanyl that caused hallucinations  . Aspirin  Nausea Only    Current Outpatient Prescriptions on File Prior to Visit  Medication Sig Dispense Refill  . albuterol (PROVENTIL HFA;VENTOLIN HFA) 108 (90 BASE) MCG/ACT inhaler Inhale 2 puffs into the lungs every 6 (six) hours as needed.        . ALPRAZolam (XANAX) 0.5 MG tablet Take 0.5 mg by mouth at bedtime as needed for anxiety.      Marland Kitchen azithromycin (ZITHROMAX) 250 MG tablet Take 2 tablets today, then 1 tablet PO daily for 4 more days  6 each  0  . Cimetidine (TAGAMET PO) Take 1 tablet by mouth as needed. Taking about 8 daily      . fluticasone (FLONASE) 50 MCG/ACT nasal spray Place 2 sprays into the nose as needed.       . hydrochlorothiazide 25 MG tablet Take 25 mg by mouth daily. TAKES 1/2 TABLET DAILY       . HYDROcodone-acetaminophen (NORCO) 10-325 MG per tablet Take 1 tablet by mouth every 6 (six) hours as needed. TAKES 1 - 2 EVERY 4 HOURS FOR BACK PAIN       . ibuprofen (ADVIL,MOTRIN) 200 MG tablet Take 200 mg by mouth every 8 (  eight) hours as needed.      . simvastatin (ZOCOR) 20 MG tablet Take 20 mg by mouth at bedtime.        . Ibuprofen-Diphenhydramine Cit (ADVIL PM PO) Take 1 tablet by mouth at bedtime as needed.       No current facility-administered medications on file prior to visit.        Objective:   Physical Exam  Filed Vitals:   06/21/14 1115  BP: 102/46  Pulse: 72  Temp: 98.1 F (36.7 C)  Height: 5\' 6"  (1.676 m)  Weight: 162 lb 1.6 oz (73.528 kg)   Alert and oriented. Skin warm and dry. Oral mucosa is moist.   . Sclera anicteric, conjunctivae is pink. Thyroid not enlarged. No cervical lymphadenopathy. Lungs clear. Heart regular rate and rhythm.  Abdomen is soft. Bowel sounds are positive. No hepatomegaly. No abdominal masses felt. No abdominal  tenderness.  No edema to lower extremities.          Assessment & Plan:  GERD. PUD needs to be ruled out.  EGD. The risks and benefits such as perforation, bleeding, and infection were reviewed with the patient and  is agreeable. Start the Protonix 40mg  daily again.

## 2014-06-21 NOTE — Patient Instructions (Addendum)
EGD. The risks and benefits such as perforation, bleeding, and infection were reviewed with the patient and is agreeable. 

## 2014-06-26 ENCOUNTER — Encounter (HOSPITAL_COMMUNITY): Payer: Self-pay | Admitting: Pharmacy Technician

## 2014-06-26 ENCOUNTER — Ambulatory Visit
Admission: RE | Admit: 2014-06-26 | Discharge: 2014-06-26 | Disposition: A | Discharge status: Home / Self Care | Payer: Medicare Other | Source: Ambulatory Visit | Attending: Radiation Oncology | Admitting: Radiation Oncology

## 2014-06-26 ENCOUNTER — Other Ambulatory Visit (INDEPENDENT_AMBULATORY_CARE_PROVIDER_SITE_OTHER): Payer: Self-pay | Admitting: *Deleted

## 2014-06-26 DIAGNOSIS — C3432 Malignant neoplasm of lower lobe, left bronchus or lung: Secondary | ICD-10-CM

## 2014-06-26 DIAGNOSIS — F17220 Nicotine dependence, chewing tobacco, uncomplicated: Secondary | ICD-10-CM | POA: Diagnosis not present

## 2014-06-26 DIAGNOSIS — K219 Gastro-esophageal reflux disease without esophagitis: Secondary | ICD-10-CM | POA: Diagnosis not present

## 2014-06-26 DIAGNOSIS — R05 Cough: Secondary | ICD-10-CM | POA: Diagnosis not present

## 2014-06-26 DIAGNOSIS — Z510 Encounter for antineoplastic radiation therapy: Secondary | ICD-10-CM | POA: Diagnosis not present

## 2014-06-26 DIAGNOSIS — M549 Dorsalgia, unspecified: Secondary | ICD-10-CM | POA: Diagnosis not present

## 2014-06-26 NOTE — Progress Notes (Signed)
COMPLEX CT SIMULATION / TREATMENT PLANNING NOTE    ICD-9-CM ICD-10-CM  1. Left lower lobe nodule suspicious for primary lung carcinoma 162.5 C34.32     The patient was positioned on the CT simulator in a complex treatment device custom fitted to their body: A body fix blue bag. The patient's head was in an Accuform.  The patient's arms were over their head. An abdominal compression device was snugly fitted to decrease the patient's intrathoracic movements. High-resolution CT axial imaging with 4D technology and with thin slices was obtained of the chest. I verified that the images were good for treatment planning. Physics was on hand for the simulation due to the highly technical nature of it.  RESPIRATORY MOTION MANAGEMENT SIMULATION  NARRATIVE:  In order to account for effect of respiratory motion on target structures and other organs in the planning and delivery of radiotherapy, this patient underwent respiratory motion management simulation.  To accomplish this, when the patient was brought to the CT simulation planning suite, 4D respiratory motion management CT images were obtained.  The CT images were loaded into the planning software.  Then, using a variety of tools including Cine, MIP, and standard views, the target volume and planning target volumes (PTV) were delineated.  Avoidance structures were contoured.  Treatment planning then occurred.  Dose volume histograms were generated and reviewed for each of the requested structure.  The resulting plan was carefully reviewed and approved today.  I contoured the patient's ITV and increased ITV with tight margins for the PTV. I will prescribe SBRT, 54 Gy in 3 fractions of 18 Gy per fraction every other day. I requested a DVH of the patient's lungs, target volumes, esophagus, heart, spinal cord and airways for 3D conformal planning.  Cone beam CT scans will be performed prior to each fraction to allow close PTV margins and sparing of normal tissues  from high doses.   -----------------------------------  Eppie Gibson, MD

## 2014-06-27 ENCOUNTER — Ambulatory Visit (HOSPITAL_COMMUNITY)
Admission: RE | Admit: 2014-06-27 | Discharge: 2014-06-27 | Disposition: A | Discharge status: Home / Self Care | Payer: Medicare Other | Source: Ambulatory Visit | Attending: Radiation Oncology | Admitting: Radiation Oncology

## 2014-06-27 DIAGNOSIS — F17210 Nicotine dependence, cigarettes, uncomplicated: Secondary | ICD-10-CM | POA: Diagnosis not present

## 2014-06-27 DIAGNOSIS — R0609 Other forms of dyspnea: Secondary | ICD-10-CM | POA: Insufficient documentation

## 2014-06-27 DIAGNOSIS — C3490 Malignant neoplasm of unspecified part of unspecified bronchus or lung: Secondary | ICD-10-CM | POA: Diagnosis not present

## 2014-06-27 DIAGNOSIS — C3491 Malignant neoplasm of unspecified part of right bronchus or lung: Secondary | ICD-10-CM

## 2014-06-27 LAB — PULMONARY FUNCTION TEST
DL/VA % pred: 85 %
DL/VA: 3.71 ml/min/mmHg/L
DLCO COR % PRED: 80 %
DLCO UNC: 21.57 ml/min/mmHg
DLCO cor: 21.57 ml/min/mmHg
DLCO unc % pred: 80 %
FEF 25-75 POST: 0.78 L/s
FEF 25-75 Pre: 0.61 L/sec
FEF2575-%Change-Post: 27 %
FEF2575-%PRED-PRE: 27 %
FEF2575-%Pred-Post: 34 %
FEV1-%Change-Post: 9 %
FEV1-%Pred-Post: 55 %
FEV1-%Pred-Pre: 50 %
FEV1-Post: 1.58 L
FEV1-Pre: 1.44 L
FEV1FVC-%Change-Post: 3 %
FEV1FVC-%Pred-Pre: 60 %
FEV6-%CHANGE-POST: 6 %
FEV6-%PRED-PRE: 83 %
FEV6-%Pred-Post: 88 %
FEV6-POST: 3.24 L
FEV6-Pre: 3.05 L
FEV6FVC-%Change-Post: 0 %
FEV6FVC-%PRED-POST: 100 %
FEV6FVC-%Pred-Pre: 100 %
FVC-%CHANGE-POST: 6 %
FVC-%PRED-POST: 88 %
FVC-%PRED-PRE: 83 %
FVC-Post: 3.44 L
FVC-Pre: 3.23 L
PRE FEV1/FVC RATIO: 45 %
Post FEV1/FVC ratio: 46 %
Post FEV6/FVC ratio: 94 %
Pre FEV6/FVC Ratio: 95 %
RV % PRED: 170 %
RV: 3.71 L
TLC % pred: 121 %
TLC: 7.56 L

## 2014-06-27 MED ORDER — ALBUTEROL SULFATE (2.5 MG/3ML) 0.083% IN NEBU
2.5000 mg | INHALATION_SOLUTION | Freq: Once | RESPIRATORY_TRACT | Status: AC
  Administered 2014-06-27: 2.5 mg via RESPIRATORY_TRACT

## 2014-06-29 ENCOUNTER — Encounter (INDEPENDENT_AMBULATORY_CARE_PROVIDER_SITE_OTHER): Payer: Self-pay | Admitting: *Deleted

## 2014-06-30 ENCOUNTER — Encounter: Payer: Self-pay | Admitting: Radiation Oncology

## 2014-06-30 DIAGNOSIS — M549 Dorsalgia, unspecified: Secondary | ICD-10-CM | POA: Diagnosis not present

## 2014-06-30 DIAGNOSIS — K219 Gastro-esophageal reflux disease without esophagitis: Secondary | ICD-10-CM | POA: Diagnosis not present

## 2014-06-30 DIAGNOSIS — R05 Cough: Secondary | ICD-10-CM | POA: Diagnosis not present

## 2014-06-30 DIAGNOSIS — Z510 Encounter for antineoplastic radiation therapy: Secondary | ICD-10-CM | POA: Diagnosis not present

## 2014-06-30 DIAGNOSIS — F17220 Nicotine dependence, chewing tobacco, uncomplicated: Secondary | ICD-10-CM | POA: Diagnosis not present

## 2014-06-30 DIAGNOSIS — C3432 Malignant neoplasm of lower lobe, left bronchus or lung: Secondary | ICD-10-CM | POA: Diagnosis not present

## 2014-07-03 ENCOUNTER — Other Ambulatory Visit (HOSPITAL_COMMUNITY): Payer: Self-pay

## 2014-07-03 DIAGNOSIS — C3432 Malignant neoplasm of lower lobe, left bronchus or lung: Secondary | ICD-10-CM

## 2014-07-04 ENCOUNTER — Encounter: Payer: Self-pay | Admitting: Radiation Oncology

## 2014-07-04 ENCOUNTER — Ambulatory Visit
Admission: RE | Admit: 2014-07-04 | Discharge: 2014-07-04 | Disposition: A | Discharge status: Home / Self Care | Payer: Medicare Other | Source: Ambulatory Visit | Attending: Radiation Oncology | Admitting: Radiation Oncology

## 2014-07-04 DIAGNOSIS — R05 Cough: Secondary | ICD-10-CM | POA: Diagnosis not present

## 2014-07-04 DIAGNOSIS — K219 Gastro-esophageal reflux disease without esophagitis: Secondary | ICD-10-CM | POA: Diagnosis not present

## 2014-07-04 DIAGNOSIS — F17220 Nicotine dependence, chewing tobacco, uncomplicated: Secondary | ICD-10-CM | POA: Diagnosis not present

## 2014-07-04 DIAGNOSIS — M549 Dorsalgia, unspecified: Secondary | ICD-10-CM | POA: Diagnosis not present

## 2014-07-04 DIAGNOSIS — Z510 Encounter for antineoplastic radiation therapy: Secondary | ICD-10-CM | POA: Diagnosis not present

## 2014-07-04 DIAGNOSIS — C3432 Malignant neoplasm of lower lobe, left bronchus or lung: Secondary | ICD-10-CM | POA: Diagnosis not present

## 2014-07-04 NOTE — Progress Notes (Signed)
  Radiation Oncology         [phone] ________________________________  Name: Maurice Orozco MRN: [id number]  Date: 07/04/2014  DOB: [birth year]  Stereotactic Body Radiotherapy Treatment Procedure Note  NARRATIVE:  Maurice Orozco was brought to the stereotactic radiation treatment machine and placed supine on the CT couch. The patient was set up for stereotactic body radiotherapy on the body fix pillow.  3D TREATMENT PLANNING AND DOSIMETRY:  The patient's radiation plan was reviewed and approved prior to starting treatment.  It showed 3-dimensional radiation distributions overlaid onto the planning CT.  The [facility] for the target structures as well as the organs at risk were reviewed. The documentation of this is filed in the radiation oncology EMR.  SIMULATION VERIFICATION:  The patient underwent CT imaging on the treatment unit.  These were carefully aligned to document that the ablative radiation dose would cover the left lower lung target volume and maximally spare the nearby organs at risk according to the planned distribution.  SPECIAL TREATMENT PROCEDURE: Maurice Orozco received high dose ablative stereotactic body radiotherapy to the planned target volume without unforeseen complications. He received  his first fraction of 1800 cGy of a prescribed 5400 cGy in 3 sessions. Treatment was delivered uneventfully. The high doses associated with stereotactic body radiotherapy and the significant potential risks require careful treatment set up and patient monitoring constituting a special treatment procedure   STEREOTACTIC TREATMENT MANAGEMENT:  Following delivery, the patient was evaluated clinically. The patient tolerated treatment without significant acute effects, and was discharged to home in stable condition.    PLAN: Continue treatment as planned.  ------------------------------------------------        Rexene Edison, MD

## 2014-07-05 ENCOUNTER — Ambulatory Visit: Payer: Medicare Other | Admitting: Radiation Oncology

## 2014-07-05 DIAGNOSIS — K219 Gastro-esophageal reflux disease without esophagitis: Secondary | ICD-10-CM | POA: Diagnosis not present

## 2014-07-05 DIAGNOSIS — M549 Dorsalgia, unspecified: Secondary | ICD-10-CM | POA: Diagnosis not present

## 2014-07-05 DIAGNOSIS — Z510 Encounter for antineoplastic radiation therapy: Secondary | ICD-10-CM | POA: Diagnosis not present

## 2014-07-05 DIAGNOSIS — F17220 Nicotine dependence, chewing tobacco, uncomplicated: Secondary | ICD-10-CM | POA: Diagnosis not present

## 2014-07-05 DIAGNOSIS — R05 Cough: Secondary | ICD-10-CM | POA: Diagnosis not present

## 2014-07-05 DIAGNOSIS — C3432 Malignant neoplasm of lower lobe, left bronchus or lung: Secondary | ICD-10-CM | POA: Diagnosis not present

## 2014-07-06 ENCOUNTER — Ambulatory Visit
Admission: RE | Admit: 2014-07-06 | Discharge: 2014-07-06 | Disposition: A | Discharge status: Home / Self Care | Payer: Medicare Other | Source: Ambulatory Visit | Attending: Radiation Oncology | Admitting: Radiation Oncology

## 2014-07-06 DIAGNOSIS — Z510 Encounter for antineoplastic radiation therapy: Secondary | ICD-10-CM | POA: Diagnosis not present

## 2014-07-06 DIAGNOSIS — C3432 Malignant neoplasm of lower lobe, left bronchus or lung: Secondary | ICD-10-CM | POA: Diagnosis not present

## 2014-07-06 DIAGNOSIS — K219 Gastro-esophageal reflux disease without esophagitis: Secondary | ICD-10-CM | POA: Diagnosis not present

## 2014-07-06 DIAGNOSIS — F17220 Nicotine dependence, chewing tobacco, uncomplicated: Secondary | ICD-10-CM | POA: Diagnosis not present

## 2014-07-06 DIAGNOSIS — R05 Cough: Secondary | ICD-10-CM | POA: Diagnosis not present

## 2014-07-06 DIAGNOSIS — M549 Dorsalgia, unspecified: Secondary | ICD-10-CM | POA: Diagnosis not present

## 2014-07-06 NOTE — Progress Notes (Signed)
  Radiation Oncology         [phone] ________________________________  Name: RAGAN REALE MRN: [id number]  Date: 07/06/2014  DOB: 1945/12/13  Stereotactic Body Radiotherapy Treatment Procedure Note (2 of 3)  NARRATIVE:  Maurice Orozco was brought to the stereotactic radiation treatment machine and placed supine on the CT couch. The patient was set up for stereotactic body radiotherapy on the body fix pillow.  3D TREATMENT PLANNING AND DOSIMETRY:  The patient's radiation plan was reviewed and approved prior to starting treatment.  It showed 3-dimensional radiation distributions overlaid onto the planning CT.  The [facility] for the target structures as well as the organs at risk were reviewed. The documentation of this is filed in the radiation oncology EMR.  SIMULATION VERIFICATION:  The patient underwent CT imaging on the treatment unit.  These were carefully aligned to document that the ablative radiation dose would cover the target volume and maximally spare the nearby organs at risk according to the planned distribution.  SPECIAL TREATMENT PROCEDURE: [name] received high dose ablative stereotactic body radiotherapy to the planned target volume without unforeseen complications. Treatment was delivered uneventfully. The high doses associated with stereotactic body radiotherapy and the significant potential risks require careful treatment set up and patient monitoring constituting a special treatment procedure   STEREOTACTIC TREATMENT MANAGEMENT:  Following delivery, the patient was evaluated clinically. The patient tolerated treatment without significant acute effects, and was discharged to home in stable condition.    PLAN: Continue treatment as planned.  ________________________________  Blair Promise, PhD, MD

## 2014-07-07 ENCOUNTER — Ambulatory Visit: Payer: Medicare Other | Admitting: Radiation Oncology

## 2014-07-10 ENCOUNTER — Encounter: Payer: Self-pay | Admitting: Radiation Oncology

## 2014-07-10 ENCOUNTER — Ambulatory Visit: Payer: Medicare Other | Admitting: Radiation Oncology

## 2014-07-10 ENCOUNTER — Inpatient Hospital Stay
Admission: RE | Admit: 2014-07-10 | Discharge: 2014-07-10 | Disposition: A | Discharge status: Home / Self Care | Payer: Medicare Other | Source: Ambulatory Visit | Attending: Radiation Oncology | Admitting: Radiation Oncology

## 2014-07-10 ENCOUNTER — Ambulatory Visit
Admission: RE | Admit: 2014-07-10 | Discharge: 2014-07-10 | Disposition: A | Discharge status: Home / Self Care | Payer: Medicare Other | Source: Ambulatory Visit | Attending: Radiation Oncology | Admitting: Radiation Oncology

## 2014-07-10 VITALS — BP 129/70 | HR 92 | Temp 98.2°F | Ht 66.0 in | Wt 163.4 lb

## 2014-07-10 DIAGNOSIS — M549 Dorsalgia, unspecified: Secondary | ICD-10-CM | POA: Diagnosis not present

## 2014-07-10 DIAGNOSIS — C3432 Malignant neoplasm of lower lobe, left bronchus or lung: Secondary | ICD-10-CM | POA: Diagnosis not present

## 2014-07-10 DIAGNOSIS — K219 Gastro-esophageal reflux disease without esophagitis: Secondary | ICD-10-CM | POA: Diagnosis not present

## 2014-07-10 DIAGNOSIS — F17220 Nicotine dependence, chewing tobacco, uncomplicated: Secondary | ICD-10-CM | POA: Diagnosis not present

## 2014-07-10 DIAGNOSIS — Z510 Encounter for antineoplastic radiation therapy: Secondary | ICD-10-CM | POA: Diagnosis not present

## 2014-07-10 DIAGNOSIS — R05 Cough: Secondary | ICD-10-CM | POA: Diagnosis not present

## 2014-07-10 NOTE — Progress Notes (Signed)
   Weekly Management Note  Outpatient    ICD-9-CM ICD-10-CM   1. Left lower lobe nodule suspicious for primary lung carcinoma 162.5 C34.32     Completed Radiotherapy. Total Dose:  54Gy   Narrative:  The patient presents for routine under treatment assessment on last day of radiotherapy.  CBCT/MVCT images/Port film x-rays were reviewed.  The chart was checked. No complaints  Physical Findings:   Vitals with Age-Percentiles 07/10/2014  Length 970.2 cm  Systolic 637  Diastolic 70  Pulse 92  Respiration   Weight 74.118 kg  BMI 26.4  VISIT REPORT    NAD  Impression:  The patient has tolerated radiotherapy.  Plan:  Routine follow-up in 3 mo's with CT of chest .  ________________________________   Eppie Gibson, M.D.

## 2014-07-10 NOTE — Progress Notes (Signed)
  Radiation Oncology         [phone] ________________________________  Name: Maurice Orozco MRN: [id number]  Date: 07/10/2014  DOB: 04-08-1946  Stereotactic Body Radiotherapy Treatment Procedure Note    ICD-9-CM ICD-10-CM   1. Left lower lobe nodule suspicious for primary lung carcinoma 162.5 C34.32     NARRATIVE:  Maurice Orozco was brought to the stereotactic radiation treatment machine and placed supine on the CT couch. The patient was set up for stereotactic body radiotherapy on the body fix pillow.  3D TREATMENT PLANNING AND DOSIMETRY:  The patient's radiation plan was reviewed and approved prior to starting treatment.  It showed 3-dimensional radiation distributions overlaid onto the planning CT.  The [facility] for the target structures as well as the organs at risk were reviewed. The documentation of this is filed in the radiation oncology EMR.  SIMULATION VERIFICATION:  The patient underwent CT imaging on the treatment unit.  These were carefully aligned to document that the ablative radiation dose would cover the target volume and maximally spare the nearby organs at risk according to the planned distribution.  SPECIAL TREATMENT PROCEDURE: Maurice Orozco received high dose ablative stereotactic body radiotherapy to the planned target volume without unforeseen complications. Treatment was delivered uneventfully. The high doses associated with stereotactic body radiotherapy and the significant potential risks require careful treatment set up and patient monitoring constituting a special treatment procedure   STEREOTACTIC TREATMENT MANAGEMENT:  Following delivery, the patient was evaluated clinically. The patient tolerated treatment without significant acute effects, and was discharged to home in stable condition.    PLAN: f/u in 33mo ________________________________  Eppie Gibson, MD

## 2014-07-10 NOTE — Progress Notes (Signed)
Maurice Orozco denies any pain in the treatment field.  He denies any SOB nor fatigue.

## 2014-07-11 ENCOUNTER — Telehealth: Payer: Self-pay | Admitting: *Deleted

## 2014-07-11 DIAGNOSIS — Z6826 Body mass index (BMI) 26.0-26.9, adult: Secondary | ICD-10-CM | POA: Diagnosis not present

## 2014-07-11 DIAGNOSIS — G894 Chronic pain syndrome: Secondary | ICD-10-CM | POA: Diagnosis not present

## 2014-07-11 DIAGNOSIS — I10 Essential (primary) hypertension: Secondary | ICD-10-CM | POA: Diagnosis not present

## 2014-07-11 DIAGNOSIS — K219 Gastro-esophageal reflux disease without esophagitis: Secondary | ICD-10-CM | POA: Diagnosis not present

## 2014-07-11 DIAGNOSIS — J0100 Acute maxillary sinusitis, unspecified: Secondary | ICD-10-CM | POA: Diagnosis not present

## 2014-07-11 NOTE — Telephone Encounter (Signed)
CALLED PATIENT TO INFORM OF LAB, TEST AND FU, LVM FOR A RETURN CALL

## 2014-07-11 NOTE — Telephone Encounter (Signed)
CALLED PATIENT TO INFORM OF LAB, TEST AND FU APPT., NO ANSWER WILL CALL LATER.

## 2014-07-12 ENCOUNTER — Ambulatory Visit: Payer: Medicare Other | Admitting: Radiation Oncology

## 2014-07-12 ENCOUNTER — Encounter (HOSPITAL_COMMUNITY): Payer: Self-pay

## 2014-07-12 ENCOUNTER — Encounter (HOSPITAL_COMMUNITY): Payer: Medicare Other | Attending: Hematology and Oncology

## 2014-07-12 ENCOUNTER — Encounter (HOSPITAL_BASED_OUTPATIENT_CLINIC_OR_DEPARTMENT_OTHER): Payer: Medicare Other

## 2014-07-12 VITALS — BP 142/79 | HR 90 | Temp 98.2°F | Resp 18 | Wt 165.8 lb

## 2014-07-12 DIAGNOSIS — J309 Allergic rhinitis, unspecified: Secondary | ICD-10-CM

## 2014-07-12 DIAGNOSIS — C3432 Malignant neoplasm of lower lobe, left bronchus or lung: Secondary | ICD-10-CM

## 2014-07-12 DIAGNOSIS — C3492 Malignant neoplasm of unspecified part of left bronchus or lung: Secondary | ICD-10-CM

## 2014-07-12 DIAGNOSIS — C3411 Malignant neoplasm of upper lobe, right bronchus or lung: Secondary | ICD-10-CM | POA: Diagnosis not present

## 2014-07-12 DIAGNOSIS — K219 Gastro-esophageal reflux disease without esophagitis: Secondary | ICD-10-CM | POA: Diagnosis not present

## 2014-07-12 DIAGNOSIS — C3491 Malignant neoplasm of unspecified part of right bronchus or lung: Secondary | ICD-10-CM | POA: Diagnosis not present

## 2014-07-12 LAB — COMPREHENSIVE METABOLIC PANEL
ALBUMIN: 4 g/dL (ref 3.5–5.2)
ALT: 17 U/L (ref 0–53)
ANION GAP: 11 (ref 5–15)
AST: 14 U/L (ref 0–37)
Alkaline Phosphatase: 67 U/L (ref 39–117)
BUN: 15 mg/dL (ref 6–23)
CALCIUM: 9.4 mg/dL (ref 8.4–10.5)
CO2: 27 mEq/L (ref 19–32)
CREATININE: 0.92 mg/dL (ref 0.50–1.35)
Chloride: 100 mEq/L (ref 96–112)
GFR calc Af Amer: >90 mL/min (ref >90)
GFR calc non Af Amer: 85 mL/min — ABNORMAL LOW (ref >90)
GLUCOSE: 102 mg/dL — AB (ref 70–99)
Potassium: 4.1 mEq/L (ref 3.7–5.3)
Sodium: 138 mEq/L (ref 137–147)
TOTAL PROTEIN: 7.4 g/dL (ref 6.0–8.3)
Total Bilirubin: 0.3 mg/dL (ref 0.3–1.2)

## 2014-07-12 LAB — CBC WITH DIFFERENTIAL/PLATELET
BASOS PCT: 1 % (ref 0–1)
Basophils Absolute: 0 10*3/uL (ref 0.0–0.1)
EOS ABS: 0.1 10*3/uL (ref 0.0–0.7)
EOS PCT: 2 % (ref 0–5)
HEMATOCRIT: 38.9 % — AB (ref 39.0–52.0)
HEMOGLOBIN: 13 g/dL (ref 13.0–17.0)
LYMPHS ABS: 0.8 10*3/uL (ref 0.7–4.0)
Lymphocytes Relative: 15 % (ref 12–46)
MCH: 32.7 pg (ref 26.0–34.0)
MCHC: 33.4 g/dL (ref 30.0–36.0)
MCV: 97.7 fL (ref 78.0–100.0)
MONO ABS: 0.4 10*3/uL (ref 0.1–1.0)
MONOS PCT: 8 % (ref 3–12)
Neutro Abs: 4.1 10*3/uL (ref 1.7–7.7)
Neutrophils Relative %: 74 % (ref 43–77)
Platelets: 271 10*3/uL (ref 150–400)
RBC: 3.98 MIL/uL — AB (ref 4.22–5.81)
RDW: 12.4 % (ref 11.5–15.5)
WBC: 5.5 10*3/uL (ref 4.0–10.5)

## 2014-07-12 LAB — LACTATE DEHYDROGENASE: LDH: 140 U/L (ref 94–250)

## 2014-07-12 NOTE — Progress Notes (Signed)
[facility]  OFFICE PROGRESS NOTE  Leonides Grills, MD [place] [address]  DIAGNOSIS: Adenocarcinoma of lung, right - Plan: CBC with Differential, Lactate dehydrogenase, Comprehensive metabolic panel  Adenocarcinoma of lung, left  Chief Complaint  Patient presents with  . lung metastases  . Lung Cancer    CURRENT THERAPY: SBRT to lung nodule metastasis. F2  INTERVAL HISTORY: Maurice Orozco 68 y.o. male returns for followup of an enlarging pulmonary nodule requiring close CT interval surveillance and now most recently a PET scan.  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 Primary tumor 1.6 cm in size 0/4 positive lymph nodes. Grade 2 cancer. No LVI.  He was seen in consultation by Dr. Isidore Moos and has undergone SBRT after PET scan revealed positive findings in two pulmonary nodules in the left lower lobe. His last treatment was on 07/10/2014. He tolerated therapy well. He does have some chronic sinus problems and was started on a Z-Pak by his family physician yesterday. He denies any significant cough, expectoration, chest pain, abdominal pain, nausea, vomiting, diarrhea, constipation, dysuria, hematuria, incontinence, lower extremity swelling or redness, skin rash, with occasional headache.  MEDICAL HISTORY: Past Medical History  Diagnosis Date  . Cancer     rt lung/dx 2011/surg only  . Hypertension   . Lung cancer   . COPD (chronic obstructive pulmonary disease)   . High cholesterol   . Chronic back pain   . Pneumonia due to Streptococcus     HX. POST OPERATIVELY  . Back fracture     DUE TO AA IN 1970  . Stress fx pelvis     DUE TO AA IN 1970  . Adenocarcinoma of lung 05/06/2011  . Hypertension 05/06/2011  . COPD (chronic obstructive pulmonary disease) 05/06/2011  . Emphysema 05/06/2011  . Hypercholesterolemia 05/06/2011  . Allergy   . Anxiety   . Arthritis     finger  . Emphysema  of lung   . GERD (gastroesophageal reflux disease)     INTERIM HISTORY: has Back fracture; Adenocarcinoma of lung; Hypertension; COPD (chronic obstructive pulmonary disease); Emphysema; Hypercholesterolemia; Left lower lobe nodule suspicious for primary lung carcinoma; GERD (gastroesophageal reflux disease); and Nausea without vomiting on his problem list.    ALLERGIES:  is allergic to fentanyl and aspirin.  MEDICATIONS: has a current medication list which includes the following prescription(s): albuterol, alprazolam, azithromycin, cimetidine, fluticasone, hydrochlorothiazide, hydrocodone-acetaminophen, pantoprazole, and simvastatin.  SURGICAL HISTORY:  Past Surgical History  Procedure Laterality Date  . Back surgery    . Lung lobectomy  2012    RT UPPER LOBE  . Back surgery  2011    metal plate l-4,L5    FAMILY HISTORY: family history includes Cancer in his maternal uncle, maternal uncle, maternal uncle, and maternal uncle; Heart attack in his brother; Hypertension in his mother; Kidney disease in his father.  SOCIAL HISTORY:  reports that he quit smoking about 3 years ago. His smoking use included Cigarettes. He smoked 0.00 packs per day. His smokeless tobacco use includes Chew. He reports that he does not drink alcohol or use illicit drugs.  REVIEW OF SYSTEMS:  Other than that discussed above is noncontributory.  PHYSICAL EXAMINATION: ECOG PERFORMANCE STATUS: 1 - Symptomatic but completely ambulatory  Blood pressure 142/79, pulse 90, temperature 98.2 F (36.8 C), temperature source Oral, resp. rate 18, weight 165 lb 12.8 oz (75.206 kg), SpO2 96 %.  GENERAL:alert, no  distress and comfortable SKIN: skin color, texture, turgor are normal, no rashes or significant lesions EYES: PERLA; Conjunctiva are pink and non-injected, sclera clear SINUSES: No redness or tenderness over maxillary or ethmoid sinuses.  OROPHARYNX:no exudate, no erythema on lips, buccal mucosa, or tongue. NECK:  supple, thyroid normal size, non-tender, without nodularity. No masses CHEST: increased AP diameter with no gynecomastia. LYMPH:  no palpable lymphadenopathy in the cervical, axillary or inguinal LUNGS: clear to auscultation and percussion with normal breathing effort HEART: regular rate & rhythm and no murmurs. ABDOMEN:abdomen soft, non-tender and normal bowel sounds MUSCULOSKELETAL:no cyanosis of digits and no clubbing. Range of motion normal.  NEURO: alert & oriented x 3 with fluent speech, no focal motor/sensory deficits   LABORATORY DATA: Lab on 07/12/2014  Component Date Value Ref Range Status  . WBC 07/12/2014 5.5  4.0 - 10.5 K/uL Final  . RBC 07/12/2014 3.98* 4.22 - 5.81 MIL/uL Final  . Hemoglobin 07/12/2014 13.0  13.0 - 17.0 g/dL Final  . HCT 07/12/2014 38.9* 39.0 - 52.0 % Final  . MCV 07/12/2014 97.7  78.0 - 100.0 fL Final  . MCH 07/12/2014 32.7  26.0 - 34.0 pg Final  . MCHC 07/12/2014 33.4  30.0 - 36.0 g/dL Final  . RDW 07/12/2014 12.4  11.5 - 15.5 % Final  . Platelets 07/12/2014 271  150 - 400 K/uL Final  . Neutrophils Relative % 07/12/2014 74  43 - 77 % Final  . Neutro Abs 07/12/2014 4.1  1.7 - 7.7 K/uL Final  . Lymphocytes Relative 07/12/2014 15  12 - 46 % Final  . Lymphs Abs 07/12/2014 0.8  0.7 - 4.0 K/uL Final  . Monocytes Relative 07/12/2014 8  3 - 12 % Final  . Monocytes Absolute 07/12/2014 0.4  0.1 - 1.0 K/uL Final  . Eosinophils Relative 07/12/2014 2  0 - 5 % Final  . Eosinophils Absolute 07/12/2014 0.1  0.0 - 0.7 K/uL Final  . Basophils Relative 07/12/2014 1  0 - 1 % Final  . Basophils Absolute 07/12/2014 0.0  0.0 - 0.1 K/uL Final  . Sodium 07/12/2014 138  137 - 147 mEq/L Final  . Potassium 07/12/2014 4.1  3.7 - 5.3 mEq/L Final  . Chloride 07/12/2014 100  96 - 112 mEq/L Final  . CO2 07/12/2014 27  19 - 32 mEq/L Final  . Glucose, Bld 07/12/2014 102* 70 - 99 mg/dL Final  . BUN 07/12/2014 15  6 - 23 mg/dL Final  . Creatinine, Ser 07/12/2014 0.92  0.50 - 1.35  mg/dL Final  . Calcium 07/12/2014 9.4  8.4 - 10.5 mg/dL Final  . Total Protein 07/12/2014 7.4  6.0 - 8.3 g/dL Final  . Albumin 07/12/2014 4.0  3.5 - 5.2 g/dL Final  . AST 07/12/2014 14  0 - 37 U/L Final  . ALT 07/12/2014 17  0 - 53 U/L Final  . Alkaline Phosphatase 07/12/2014 67  39 - 117 U/L Final  . Total Bilirubin 07/12/2014 0.3  0.3 - 1.2 mg/dL Final  . GFR calc non Af Amer 07/12/2014 85* >90 mL/min Final  . GFR calc Af Amer 07/12/2014 >90  >90 mL/min Final   Comment: (NOTE) The eGFR has been calculated using the CKD EPI equation. This calculation has not been validated in all clinical situations. eGFR's persistently <90 mL/min signify possible Chronic Kidney Disease.   . Anion gap 07/12/2014 11  5 - 15 Final  . LDH 07/12/2014 140  94 - 250 U/L Final  Hospital Outpatient Visit on  06/27/2014  Component Date Value Ref Range Status  . FVC-Pre 06/27/2014 3.23   Final  . FVC-%Pred-Pre 06/27/2014 83   Final  . FVC-Post 06/27/2014 3.44   Final  . FVC-%Pred-Post 06/27/2014 88   Final  . FVC-%Change-Post 06/27/2014 6   Final  . FEV1-Pre 06/27/2014 1.44   Final  . FEV1-%Pred-Pre 06/27/2014 50   Final  . FEV1-Post 06/27/2014 1.58   Final  . FEV1-%Pred-Post 06/27/2014 55   Final  . FEV1-%Change-Post 06/27/2014 9   Final  . FEV6-Pre 06/27/2014 3.05   Final  . FEV6-%Pred-Pre 06/27/2014 83   Final  . FEV6-Post 06/27/2014 3.24   Final  . FEV6-%Pred-Post 06/27/2014 88   Final  . FEV6-%Change-Post 06/27/2014 6   Final  . Pre FEV1/FVC ratio 06/27/2014 45   Final  . FEV1FVC-%Pred-Pre 06/27/2014 60   Final  . Post FEV1/FVC ratio 06/27/2014 46   Final  . FEV1FVC-%Change-Post 06/27/2014 3   Final  . Pre FEV6/FVC Ratio 06/27/2014 95   Final  . FEV6FVC-%Pred-Pre 06/27/2014 100   Final  . Post FEV6/FVC ratio 06/27/2014 94   Final  . FEV6FVC-%Pred-Post 06/27/2014 100   Final  . FEV6FVC-%Change-Post 06/27/2014 0   Final  . FEF 25-75 Pre 06/27/2014 0.61   Final  . FEF2575-%Pred-Pre 06/27/2014 27    Final  . FEF 25-75 Post 06/27/2014 0.78   Final  . FEF2575-%Pred-Post 06/27/2014 34   Final  . FEF2575-%Change-Post 06/27/2014 27   Final  . RV 06/27/2014 3.71   Final  . RV % pred 06/27/2014 170   Final  . TLC 06/27/2014 7.56   Final  . TLC % pred 06/27/2014 121   Final  . DLCO unc 06/27/2014 21.57   Final  . DLCO unc % pred 06/27/2014 80   Final  . DLCO cor 06/27/2014 21.57   Final  . DLCO cor % pred 06/27/2014 80   Final  . DL/VA 06/27/2014 3.71   Final  . DL/VA % pred 06/27/2014 85   Final    PATHOLOGY:adenocarcinoma.  Urinalysis    Component Value Date/Time   COLORURINE YELLOW 10/15/2010 0646   APPEARANCEUR CLEAR 10/15/2010 0646   LABSPEC 1.023 10/15/2010 0646   PHURINE 5.5 10/15/2010 0646   HGBUR NEGATIVE 10/15/2010 0646   BILIRUBINUR NEGATIVE 10/15/2010 0646   KETONESUR NEGATIVE 10/15/2010 0646   PROTEINUR NEGATIVE 10/15/2010 0646   UROBILINOGEN 0.2 10/15/2010 0646   NITRITE NEGATIVE 10/15/2010 0646   LEUKOCYTESUR  10/15/2010 0646    NEGATIVE MICROSCOPIC NOT DONE ON URINES WITH NEGATIVE PROTEIN, BLOOD, LEUKOCYTES, NITRITE, OR GLUCOSE <1000 mg/dL.    RADIOGRAPHIC STUDIES: No results found. Image Restag (PS) Skull Base To Thigh   Status: Final result       PACS Images     Show images for NM PET Image Restag (PS) Skull Base To Thigh     Study Result     CLINICAL DATA: Subsequent treatment strategy for lung carcinoma. Enlarging left lower lobe nodule.  EXAM: NUCLEAR MEDICINE PET SKULL BASE TO THIGH  TECHNIQUE: 8.2 mCi F-18 FDG was injected intravenously. Full-ring PET imaging was performed from the skull base to thigh after the radiotracer. CT data was obtained and used for attenuation correction and anatomic localization.  FASTING BLOOD GLUCOSE: Value: 104 mg/dl  COMPARISON: PET-CT 02/06/2014, CT 05/10/2014, CT 11/01/2012  FINDINGS: NECK  No hypermetabolic lymph nodes in the neck.  CHEST  Left lower lobe pulmonary nodule  measures 10 mm (image 78, series 4) increased from 8  mm on comparison PET-CT scan and 4 mm on CT of 11/01/12. The lesion has mild associated metabolic activity with SUV max = 1.2 increased from SUV max equal 0.7.  No hypermetabolic mediastinal lymph nodes.  ABDOMEN/PELVIS  No abnormal hypermetabolic activity within the liver, pancreas, adrenal glands, or spleen. No hypermetabolic lymph nodes in the abdomen or pelvis.  SKELETON  No focal hypermetabolic activity to suggest skeletal metastasis.  IMPRESSION: Enlarging hypermetabolic left lower lobe pulmonary nodules concerning for bronchogenic carcinoma.  Stage by FDG PET imaging T1a N0 M0.   Electronically Signed  By: Suzy Bouchard M.D.  On: 06/02/2014 12:08     ASSESSMENT:  #1. Enlarging left lower lobe nodule, highly suspicious for malignancy, status post SBRT ending on 07/10/2014, tolerated well. #2. Stage I-A adenocarcinoma of the right upper lobe, status post right upper lobectomy in 10/15/2010, 4 lymph nodes all negative, grade 2 without lymphovascular invasion, no postop treatment. #3. Severe gastroesophageal reflux disease, despite high doses of Tagamet. #4. Allergic rhinitis, symptomatic, currently on treatment with a Z-Pak.   PLAN:  #1. Follow-up appointment and CT scans have been scheduled by Dr. Isidore Moos for February 2016. #2. Office visit with CBC, chem profile, LDH and March 2016. Patient was encouraged to call should any new symptoms occur that are troublesome and persistent.   All questions were answered. The patient knows to call the clinic with any problems, questions or concerns. We can certainly see the patient much sooner if necessary.   I spent 25 minutes counseling the patient face to face. The total time spent in the appointment was 30 minutes.    Doroteo Bradford, MD 07/12/2014 2:03 PM  DISCLAIMER:  This note was dictated with voice recognition software.  Similar sounding words can  inadvertently be transcribed inaccurately and may not be corrected upon review.

## 2014-07-12 NOTE — Patient Instructions (Signed)
[facility] Discharge Instructions  RECOMMENDATIONS MADE BY THE CONSULTANT AND ANY TEST RESULTS WILL BE SENT TO YOUR REFERRING PHYSICIAN.  EXAM FINDINGS BY THE PHYSICIAN TODAY AND SIGNS OR SYMPTOMS TO REPORT TO CLINIC OR PRIMARY PHYSICIAN: You saw Dr Barnet Glasgow today  MEDICATIONS PRESCRIBED:  No new medications   SPECIAL INSTRUCTIONS/FOLLOW-UP: Follow up with doctors appt and lab work in march 2016    Thank you for choosing [facility] to provide your oncology and hematology care.  To afford each patient quality time with our providers, please arrive at least 15 minutes before your scheduled appointment time.  With your help, our goal is to use those 15 minutes to complete the necessary work-up to ensure our physicians have the information they need to help with your evaluation and healthcare recommendations.    Effective January 1st, 2014, we ask that you re-schedule your appointment with our physicians should you arrive 10 or more minutes late for your appointment.  We strive to give you quality time with our providers, and arriving late affects you and other patients whose appointments are after yours.    Again, thank you for choosing [facility].  Our hope is that these requests will decrease the amount of time that you wait before being seen by our physicians.       _____________________________________________________________  Should you have questions after your visit to [facility], please contact our office at [phone] between the hours of 8:30 a.m. and 5:00 p.m.  Voicemails left after 4:30 p.m. will not be returned until the following business day.  For prescription refill requests, have your pharmacy contact our office with your prescription refill request.

## 2014-07-12 NOTE — Progress Notes (Signed)
LABS FOR [facility] CMP LDH

## 2014-07-14 NOTE — Progress Notes (Signed)
  Radiation Oncology         (336) [phone] ________________________________  Name: PARV MANTHEY MRN: [id number]  Date: 07/10/2014  DOB: [birth year]  End of Treatment Note  Diagnosis:   left lower lobe nodule suspicious for primary lung carcinoma, T1aN0M0 Stage IA  Indication for treatment:  curative    Radiation treatment dates:   07/04/2014, 07/06/2014, 07/10/2014  Site/dose:   Left lower lung nodule / 54 Gy in 3 fractions given every other day  Beams/energy:   SBRT 3D  //// 6 MV FFF  Narrative: The patient tolerated radiation treatment relatively well.     Plan: The patient has completed radiation treatment. The patient will return to radiation oncology clinic for routine follow up with CT of chest in 3 months. I advised them to call or return sooner if they have any questions or concerns related to their recovery or treatment.  -----------------------------------  Eppie Gibson, MD

## 2014-07-17 ENCOUNTER — Ambulatory Visit: Payer: Medicare Other | Admitting: Radiation Oncology

## 2014-07-18 ENCOUNTER — Telehealth (INDEPENDENT_AMBULATORY_CARE_PROVIDER_SITE_OTHER): Payer: Self-pay | Admitting: *Deleted

## 2014-07-18 NOTE — Telephone Encounter (Signed)
noted 

## 2014-07-18 NOTE — Telephone Encounter (Signed)
FYI: patient's wife left message to cancel EGD, patient states stomach is feeling better

## 2014-07-26 NOTE — Progress Notes (Signed)
3 Dynamic Conformal Arcs will be used for this patient's lung radiotherapy.  I approved his plan and DVH today.  The dose is 54 Gy in 3 fractions, to be given via SBRT technique.  The plan required 3 dosimetry calculations and 3 complex treatment devices.  06-30-14 -----------------------------------  Eppie Gibson, MD

## 2014-08-09 ENCOUNTER — Encounter (HOSPITAL_COMMUNITY): Admission: RE | Payer: Self-pay | Source: Ambulatory Visit

## 2014-08-09 ENCOUNTER — Ambulatory Visit (HOSPITAL_COMMUNITY): Admission: RE | Admit: 2014-08-09 | Payer: Medicare Other | Source: Ambulatory Visit | Admitting: Internal Medicine

## 2014-08-09 SURGERY — EGD (ESOPHAGOGASTRODUODENOSCOPY)
Anesthesia: Moderate Sedation

## 2014-08-10 DIAGNOSIS — G894 Chronic pain syndrome: Secondary | ICD-10-CM | POA: Diagnosis not present

## 2014-08-10 DIAGNOSIS — Z6826 Body mass index (BMI) 26.0-26.9, adult: Secondary | ICD-10-CM | POA: Diagnosis not present

## 2014-08-10 DIAGNOSIS — J0140 Acute pansinusitis, unspecified: Secondary | ICD-10-CM | POA: Diagnosis not present

## 2014-08-10 DIAGNOSIS — E663 Overweight: Secondary | ICD-10-CM | POA: Diagnosis not present

## 2014-09-07 DIAGNOSIS — Z6825 Body mass index (BMI) 25.0-25.9, adult: Secondary | ICD-10-CM | POA: Diagnosis not present

## 2014-09-07 DIAGNOSIS — E663 Overweight: Secondary | ICD-10-CM | POA: Diagnosis not present

## 2014-09-07 DIAGNOSIS — J019 Acute sinusitis, unspecified: Secondary | ICD-10-CM | POA: Diagnosis not present

## 2014-09-07 DIAGNOSIS — G894 Chronic pain syndrome: Secondary | ICD-10-CM | POA: Diagnosis not present

## 2014-10-03 DIAGNOSIS — M5136 Other intervertebral disc degeneration, lumbar region: Secondary | ICD-10-CM | POA: Diagnosis not present

## 2014-10-03 DIAGNOSIS — G894 Chronic pain syndrome: Secondary | ICD-10-CM | POA: Diagnosis not present

## 2014-10-03 DIAGNOSIS — Z6826 Body mass index (BMI) 26.0-26.9, adult: Secondary | ICD-10-CM | POA: Diagnosis not present

## 2014-10-05 ENCOUNTER — Encounter: Payer: Self-pay | Admitting: *Deleted

## 2014-10-12 ENCOUNTER — Encounter (HOSPITAL_COMMUNITY): Payer: Self-pay

## 2014-10-12 ENCOUNTER — Ambulatory Visit
Admission: RE | Admit: 2014-10-12 | Discharge: 2014-10-12 | Disposition: A | Discharge status: Home / Self Care | Payer: Medicare Other | Source: Ambulatory Visit | Attending: Radiation Oncology | Admitting: Radiation Oncology

## 2014-10-12 ENCOUNTER — Ambulatory Visit (HOSPITAL_COMMUNITY)
Admission: RE | Admit: 2014-10-12 | Discharge: 2014-10-12 | Disposition: A | Discharge status: Home / Self Care | Payer: Medicare Other | Source: Ambulatory Visit | Attending: Radiation Oncology | Admitting: Radiation Oncology

## 2014-10-12 ENCOUNTER — Ambulatory Visit (HOSPITAL_COMMUNITY): Payer: Medicare Other

## 2014-10-12 ENCOUNTER — Other Ambulatory Visit (HOSPITAL_COMMUNITY): Payer: Medicare Other

## 2014-10-12 DIAGNOSIS — C3432 Malignant neoplasm of lower lobe, left bronchus or lung: Secondary | ICD-10-CM | POA: Diagnosis not present

## 2014-10-12 DIAGNOSIS — R911 Solitary pulmonary nodule: Secondary | ICD-10-CM | POA: Diagnosis not present

## 2014-10-12 LAB — BUN AND CREATININE (CC13)
BUN: 14.3 mg/dL (ref 7.0–26.0)
Creatinine: 1 mg/dL (ref 0.7–1.3)
EGFR: 82 mL/min/{1.73_m2} — ABNORMAL LOW (ref >90)

## 2014-10-12 MED ORDER — IOHEXOL 300 MG/ML  SOLN
80.0000 mL | Freq: Once | INTRAMUSCULAR | Status: AC | PRN
  Administered 2014-10-12: 80 mL via INTRAVENOUS

## 2014-10-13 ENCOUNTER — Ambulatory Visit
Admission: RE | Admit: 2014-10-13 | Discharge: 2014-10-13 | Disposition: A | Discharge status: Home / Self Care | Payer: Medicare Other | Source: Ambulatory Visit | Attending: Radiation Oncology | Admitting: Radiation Oncology

## 2014-10-13 ENCOUNTER — Encounter: Payer: Self-pay | Admitting: Radiation Oncology

## 2014-10-13 ENCOUNTER — Telehealth: Payer: Self-pay | Admitting: *Deleted

## 2014-10-13 VITALS — BP 149/64 | HR 79 | Temp 98.7°F | Resp 20 | Wt 166.3 lb

## 2014-10-13 DIAGNOSIS — C3432 Malignant neoplasm of lower lobe, left bronchus or lung: Secondary | ICD-10-CM

## 2014-10-13 NOTE — Progress Notes (Signed)
Radiation Oncology         (336) [phone] ________________________________  Name: QUADARIUS HENTON MRN: [id number]  Date: 10/13/2014  DOB: 23-Jan-1946  Follow-Up Visit Note CC: Collene Mares MD  Outpatient  CC: Leonides Grills, MD  Farrel Gobble, MD  Diagnosis and Prior Radiotherapy:    ICD-9-CM ICD-10-CM   1. Left lower lobe nodule suspicious for primary lung carcinoma 162.5 C34.32     Left lower lobe nodule suspicious for primary lung carcinoma, T1aN0M0 Stage IA  Indication for treatment:  curative    Radiation treatment dates:   07/04/2014, 07/06/2014, 07/10/2014  Site/dose:   Left lower lung nodule / 54 Gy in 3 fractions given every other day  Beams/energy:   SBRT 3D  //// 6 MV FFF  Narrative:  The patient returns today for routine follow-up. Patient reports chronic low back pain (not new) but denies other pain. He denies SOB, has cough due to post nasal drip, no fatigue or loss of appetite.                             ALLERGIES:  is allergic to fentanyl and aspirin.  Meds: Current Outpatient Prescriptions  Medication Sig Dispense Refill  . albuterol (PROVENTIL HFA;VENTOLIN HFA) 108 (90 BASE) MCG/ACT inhaler Inhale 2 puffs into the lungs every 6 (six) hours as needed for wheezing or shortness of breath.     . ALPRAZolam (XANAX) 0.5 MG tablet Take 0.5 mg by mouth at bedtime as needed for sleep.     . Cimetidine (TAGAMET PO) Take 1 tablet by mouth as needed (stomach). Taking about 8 daily    . fluticasone (FLONASE) 50 MCG/ACT nasal spray Place 2 sprays into the nose as needed for allergies.     . hydrochlorothiazide 25 MG tablet Take 25 mg by mouth daily. TAKES 1/2 TABLET DAILY     . HYDROcodone-acetaminophen (NORCO) 10-325 MG per tablet Take 1 tablet by mouth every 6 (six) hours as needed. TAKES 1 - 2 EVERY 4 HOURS FOR BACK PAIN     . pantoprazole (PROTONIX) 40 MG tablet Take 40 mg by mouth daily.    . simvastatin (ZOCOR) 40 MG tablet      No current  facility-administered medications for this encounter.    Physical Findings: The patient is in no acute distress. Patient is alert and oriented.  weight is 166 lb 4.8 oz (75.433 kg). His oral temperature is 98.7 F (37.1 C). His blood pressure is 149/64 and his pulse is 79. His respiration is 20 and oxygen saturation is 99%. .   LUNGS CTAB, Heart RRR, NECK WITHOUT ADENOPATHY   Lab Findings: Lab Results  Component Value Date   WBC 5.5 07/12/2014   HGB 13.0 07/12/2014   HCT 38.9* 07/12/2014   MCV 97.7 07/12/2014   PLT 271 07/12/2014    Radiographic Findings: Ct Chest W Contrast  10/12/2014   CLINICAL DATA:  History of right lung cancer. Status post partial right lobectomy 2011. Left lung cancer in 2015 with partial lobectomy.  EXAM: CT CHEST WITH CONTRAST  TECHNIQUE: Multidetector CT imaging of the chest was performed during intravenous contrast administration.  CONTRAST:  66mL OMNIPAQUE IOHEXOL 300 MG/ML  SOLN  COMPARISON:  06/02/2014.  FINDINGS: Mediastinum: Heart size is normal. There is no pericardial or pleural effusion. The trachea appears patent and is midline. There is a small hiatal hernia. The esophagus is otherwise normal. There is no enlarged mediastinal or hilar  lymph nodes.  Lungs/Pleura: No pleural effusion identified. Postop change and volume loss from the right lung noted. There is no evidence for residual or recurrent tumor within the right lung. Pulmonary nodule in the perifissural left lower lobe measures 6 mm, image 33/ series 5. Decreased from 11 mm previously. Left upper lobe perifissural scar is stable, image 22/series 5. 3 mm left apical nodule is unchanged, image 8/series 5.  Upper Abdomen: No suspicious liver abnormality identified. The visualized portions of the spleen and adrenal glands are normal.  Musculoskeletal: Review of the visualized bony structures is negative for aggressive lytic or sclerotic bone lesion.  IMPRESSION: 1. No acute findings within the chest. 2.  Decrease in size of left lower lobe perifissural nodule compatible with response to therapy. 3. No new or progressive disease identified within the chest.   Electronically Signed   By: Kerby Moors M.D.   On: 10/12/2014 15:39    Impression/Plan:  CT images reviewed with patient.  Excellent response to RT with NED.  Re-scan in 3 mo, f/u with me thereafter. He is pleased with this plan.   _____________________________________   Eppie Gibson, MD

## 2014-10-13 NOTE — Progress Notes (Signed)
Patient reports chronic low back pain but denies other pain. He denies SOB, has cough due to post nasal drip, no fatigue or loss of appetite.

## 2014-10-13 NOTE — Telephone Encounter (Signed)
CALLED PATIENT TO INFORM OF SCAN AND FU FOR 01-2015, SPOKE WITH HIS WIFE GENEVA AND SHE IS AWARE OF THESE APPTS.

## 2014-10-30 DIAGNOSIS — M5136 Other intervertebral disc degeneration, lumbar region: Secondary | ICD-10-CM | POA: Diagnosis not present

## 2014-10-30 DIAGNOSIS — G894 Chronic pain syndrome: Secondary | ICD-10-CM | POA: Diagnosis not present

## 2014-10-30 DIAGNOSIS — M5126 Other intervertebral disc displacement, lumbar region: Secondary | ICD-10-CM | POA: Diagnosis not present

## 2014-10-30 DIAGNOSIS — Z6826 Body mass index (BMI) 26.0-26.9, adult: Secondary | ICD-10-CM | POA: Diagnosis not present

## 2014-11-03 ENCOUNTER — Other Ambulatory Visit (HOSPITAL_COMMUNITY): Payer: Self-pay | Admitting: Physician Assistant

## 2014-11-03 DIAGNOSIS — M5126 Other intervertebral disc displacement, lumbar region: Secondary | ICD-10-CM

## 2014-11-03 DIAGNOSIS — G894 Chronic pain syndrome: Secondary | ICD-10-CM

## 2014-11-03 DIAGNOSIS — M5136 Other intervertebral disc degeneration, lumbar region: Secondary | ICD-10-CM

## 2014-11-07 ENCOUNTER — Other Ambulatory Visit: Payer: Self-pay | Admitting: Physician Assistant

## 2014-11-07 DIAGNOSIS — M5126 Other intervertebral disc displacement, lumbar region: Secondary | ICD-10-CM

## 2014-11-08 ENCOUNTER — Ambulatory Visit (HOSPITAL_COMMUNITY): Payer: Medicare Other | Admitting: Hematology & Oncology

## 2014-11-08 ENCOUNTER — Encounter (HOSPITAL_BASED_OUTPATIENT_CLINIC_OR_DEPARTMENT_OTHER): Payer: Medicare Other

## 2014-11-08 ENCOUNTER — Other Ambulatory Visit: Payer: Self-pay | Admitting: *Deleted

## 2014-11-08 ENCOUNTER — Encounter (HOSPITAL_COMMUNITY): Payer: Medicare Other | Attending: Hematology & Oncology | Admitting: Hematology & Oncology

## 2014-11-08 ENCOUNTER — Other Ambulatory Visit (HOSPITAL_COMMUNITY): Payer: Medicare Other

## 2014-11-08 VITALS — BP 156/81 | HR 70 | Temp 98.6°F | Resp 20 | Wt 165.5 lb

## 2014-11-08 DIAGNOSIS — C3491 Malignant neoplasm of unspecified part of right bronchus or lung: Secondary | ICD-10-CM | POA: Insufficient documentation

## 2014-11-08 DIAGNOSIS — Z87891 Personal history of nicotine dependence: Secondary | ICD-10-CM | POA: Diagnosis not present

## 2014-11-08 DIAGNOSIS — K429 Umbilical hernia without obstruction or gangrene: Secondary | ICD-10-CM | POA: Diagnosis not present

## 2014-11-08 DIAGNOSIS — C3411 Malignant neoplasm of upper lobe, right bronchus or lung: Secondary | ICD-10-CM | POA: Diagnosis not present

## 2014-11-08 LAB — CBC WITH DIFFERENTIAL/PLATELET
BASOS ABS: 0 10*3/uL (ref 0.0–0.1)
Basophils Relative: 0 % (ref 0–1)
Eosinophils Absolute: 0.1 10*3/uL (ref 0.0–0.7)
Eosinophils Relative: 1 % (ref 0–5)
HCT: 39.8 % (ref 39.0–52.0)
HEMOGLOBIN: 13.4 g/dL (ref 13.0–17.0)
LYMPHS PCT: 17 % (ref 12–46)
Lymphs Abs: 1.6 10*3/uL (ref 0.7–4.0)
MCH: 33.3 pg (ref 26.0–34.0)
MCHC: 33.7 g/dL (ref 30.0–36.0)
MCV: 98.8 fL (ref 78.0–100.0)
MONO ABS: 0.8 10*3/uL (ref 0.1–1.0)
Monocytes Relative: 9 % (ref 3–12)
NEUTROS ABS: 6.9 10*3/uL (ref 1.7–7.7)
Neutrophils Relative %: 73 % (ref 43–77)
PLATELETS: 250 10*3/uL (ref 150–400)
RBC: 4.03 MIL/uL — ABNORMAL LOW (ref 4.22–5.81)
RDW: 12.3 % (ref 11.5–15.5)
WBC: 9.4 10*3/uL (ref 4.0–10.5)

## 2014-11-08 LAB — COMPREHENSIVE METABOLIC PANEL
ALT: 26 U/L (ref 0–53)
ANION GAP: 8 (ref 5–15)
AST: 18 U/L (ref 0–37)
Albumin: 4.3 g/dL (ref 3.5–5.2)
Alkaline Phosphatase: 54 U/L (ref 39–117)
BILIRUBIN TOTAL: 0.3 mg/dL (ref 0.3–1.2)
BUN: 21 mg/dL (ref 6–23)
CALCIUM: 9.1 mg/dL (ref 8.4–10.5)
CHLORIDE: 100 mmol/L (ref 96–112)
CO2: 30 mmol/L (ref 19–32)
CREATININE: 1.05 mg/dL (ref 0.50–1.35)
GFR calc Af Amer: 83 mL/min — ABNORMAL LOW (ref >90)
GFR calc non Af Amer: 71 mL/min — ABNORMAL LOW (ref >90)
Glucose, Bld: 104 mg/dL — ABNORMAL HIGH (ref 70–99)
Potassium: 3.8 mmol/L (ref 3.5–5.1)
Sodium: 138 mmol/L (ref 135–145)
TOTAL PROTEIN: 7.4 g/dL (ref 6.0–8.3)

## 2014-11-08 LAB — LACTATE DEHYDROGENASE: LDH: 155 U/L (ref 94–250)

## 2014-11-08 NOTE — Patient Instructions (Signed)
..  [place] at [facility] Discharge Instructions  RECOMMENDATIONS MADE BY THE CONSULTANT AND ANY TEST RESULTS WILL BE SENT TO YOUR REFERRING PHYSICIAN.  Return in 4 months  Call with problems such as new or different cough or pain  Thank you for choosing [place] at [facility] to provide your oncology and hematology care.  To afford each patient quality time with our provider, please arrive at least 15 minutes before your scheduled appointment time.    You need to re-schedule your appointment should you arrive 10 or more minutes late.  We strive to give you quality time with our providers, and arriving late affects you and other patients whose appointments are after yours.  Also, if you no show three or more times for appointments you may be dismissed from the clinic at the providers discretion.     Again, thank you for choosing [facility].  Our hope is that these requests will decrease the amount of time that you wait before being seen by our physicians.       _____________________________________________________________  Should you have questions after your visit to [facility], please contact our office at [phone] between the hours of 8:30 a.m. and 4:30 p.m.  Voicemails left after 4:30 p.m. will not be returned until the following business day.  For prescription refill requests, have your pharmacy contact our office.

## 2014-11-08 NOTE — Progress Notes (Signed)
Leonides Grills, MD [place] [address]    DIAGNOSIS: Adenocarcinoma of lung   Staging form: Lung, AJCC 7th Edition     Clinical: Stage IA (T1a, N0, M0) - Signed by Baird Cancer, PA on 05/06/2011   SUMMARY OF ONCOLOGIC HISTORY:   Adenocarcinoma of lung   09/30/2010 Cancer Staging CT of chest showed spiculated lesion   10/15/2010 Surgery Right upper lobectomy by Dr. Arlyce Dice   10/16/2010 Remission    10/24/2013 Imaging CT Chest-  Enlarging subpleural nodule in the left lower lobe, now 9 mm, with distortion of the overlying pleura. Finding is concerning for a small bronchogenic carcinoma   01/23/2014 Imaging CT Chest- Left lower lobe nodule has enlarged from 11/01/2012 and is worrisome for primary bronchogenic carcinoma.    07/04/2014 - 07/10/2014 Radiation Therapy SBRT by Dr. Isidore Moos in 3 fractions.    Left lower lobe nodule suspicious for primary lung carcinoma   06/18/2014 Initial Diagnosis Left lower lobe nodule suspicious for primary lung carcinoma   07/04/2014 - 07/10/2014 Radiation Therapy SBRT    CURRENT THERAPY: Observation  INTERVAL HISTORY: RAFAN SANDERS 69 y.o. male returns for follow-up of lung cancer. He has recently undergone CT imaging of the chest on February 4 for Dr. Isidore Moos. He has no major complaints today. He states he is eating and sleeping well. He is up-to-date on his other well carry including a colonoscopy. He has an abnormality in the belly button that he is concerned about. He states it has been present for about 2 months. He would like for me to examine that today.  MEDICAL HISTORY: Past Medical History  Diagnosis Date  . Cancer     rt lung/dx 2011/surg only  . Hypertension   . Lung cancer   . COPD (chronic obstructive pulmonary disease)   . High cholesterol   . Chronic back pain   . Pneumonia due to Streptococcus     HX. POST OPERATIVELY  . Back fracture     DUE TO AA IN 1970  . Stress fx pelvis     DUE TO AA IN 1970  .  Adenocarcinoma of lung 05/06/2011  . Hypertension 05/06/2011  . COPD (chronic obstructive pulmonary disease) 05/06/2011  . Emphysema 05/06/2011  . Hypercholesterolemia 05/06/2011  . Allergy   . Anxiety   . Arthritis     finger  . Emphysema of lung   . GERD (gastroesophageal reflux disease)   . History of radiation therapy 07/04/14, 07/06/14, 07/10/14    LLL lung nodule/54 Gy/3 fx- SBRT    has Back fracture; Adenocarcinoma of lung; Hypertension; COPD (chronic obstructive pulmonary disease); Emphysema; Hypercholesterolemia; Left lower lobe nodule suspicious for primary lung carcinoma; GERD (gastroesophageal reflux disease); and Nausea without vomiting on his problem list.     is allergic to fentanyl and aspirin.  Mr. Marlett does not currently have medications on file.  SURGICAL HISTORY: Past Surgical History  Procedure Laterality Date  . Back surgery    . Lung lobectomy  2012    RT UPPER LOBE  . Back surgery  2011    metal plate l-4,L5    SOCIAL HISTORY: History   Social History  . Marital Status: Married    Spouse Name: N/A  . Number of Children: 3  . Years of Education: N/A   Occupational History  . Not on file.   Social History Main Topics  . Smoking status: Former Smoker    Types: Cigarettes  Quit date: 09/08/2010  . Smokeless tobacco: Current User    Types: Chew     Comment: still chews tobacco  . Alcohol Use: No  . Drug Use: No  . Sexual Activity: Not Currently   Other Topics Concern  . Not on file   Social History Narrative    FAMILY HISTORY: Family History  Problem Relation Age of Onset  . Hypertension Mother   . Kidney disease Father   . Heart attack Brother   . Cancer Maternal Uncle     prostate cancer  . Cancer Maternal Uncle     prostate cancer  . Cancer Maternal Uncle     prostate cancer  . Cancer Maternal Uncle     prostate cancer    Review of Systems  Constitutional: Positive for malaise/fatigue. Negative for fever, chills and  weight loss.  HENT: Negative for congestion, hearing loss, nosebleeds, sore throat and tinnitus.   Eyes: Negative for blurred vision, double vision, pain and discharge.  Respiratory: Negative for cough, hemoptysis, sputum production, shortness of breath and wheezing.   Cardiovascular: Negative for chest pain, palpitations, claudication, leg swelling and PND.  Gastrointestinal: Negative for heartburn, nausea, vomiting, abdominal pain, diarrhea, constipation, blood in stool and melena.       "puffy belly button"  Genitourinary: Negative for dysuria, urgency, frequency and hematuria.  Musculoskeletal: Negative for myalgias, joint pain and falls.  Skin: Negative for itching and rash.  Neurological: Negative for dizziness, tingling, tremors, sensory change, speech change, focal weakness, seizures, loss of consciousness, weakness and headaches.  Endo/Heme/Allergies: Does not bruise/bleed easily.  Psychiatric/Behavioral: Negative for depression, suicidal ideas, memory loss and substance abuse. The patient is not nervous/anxious and does not have insomnia.     PHYSICAL EXAMINATION  ECOG PERFORMANCE STATUS: 1 - Symptomatic but completely ambulatory  Filed Vitals:   11/08/14 1200  BP: 156/81  Pulse: 70  Temp: 98.6 F (37 C)  Resp: 20    Physical Exam  Constitutional: He is oriented to person, place, and time and well-developed, well-nourished, and in no distress.  HENT:  Head: Normocephalic and atraumatic.  Nose: Nose normal.  Mouth/Throat: Oropharynx is clear and moist. No oropharyngeal exudate.  Eyes: Conjunctivae and EOM are normal. Pupils are equal, round, and reactive to light. Right eye exhibits no discharge. Left eye exhibits no discharge. No scleral icterus.  Neck: Normal range of motion. Neck supple. No tracheal deviation present. No thyromegaly present.  Cardiovascular: Normal rate, regular rhythm and normal heart sounds.  Exam reveals no gallop and no friction rub.   No murmur  heard. Pulmonary/Chest: Effort normal and breath sounds normal. He has no wheezes. He has no rales.  Abdominal: Soft. Bowel sounds are normal. He exhibits no distension and no mass. There is no tenderness. There is no rebound and no guarding.  Umbilical hernia noted on exam, easily reducible   Musculoskeletal: Normal range of motion. He exhibits no edema.  Lymphadenopathy:    He has no cervical adenopathy.  Neurological: He is alert and oriented to person, place, and time. He has normal reflexes. No cranial nerve deficit. Gait normal. Coordination normal.  Skin: Skin is warm and dry. No rash noted.  Psychiatric: Mood, memory, affect and judgment normal.  Nursing note and vitals reviewed.   LABORATORY DATA:  CBC    Component Value Date/Time   WBC 9.4 11/08/2014 1243   RBC 4.03* 11/08/2014 1243   HGB 13.4 11/08/2014 1243   HCT 39.8 11/08/2014 1243   PLT 250  11/08/2014 1243   MCV 98.8 11/08/2014 1243   MCH 33.3 11/08/2014 1243   MCHC 33.7 11/08/2014 1243   RDW 12.3 11/08/2014 1243   LYMPHSABS 1.6 11/08/2014 1243   MONOABS 0.8 11/08/2014 1243   EOSABS 0.1 11/08/2014 1243   BASOSABS 0.0 11/08/2014 1243   CMP     Component Value Date/Time   NA 138 07/12/2014 1158   K 4.1 07/12/2014 1158   CL 100 07/12/2014 1158   CO2 27 07/12/2014 1158   GLUCOSE 102* 07/12/2014 1158   BUN 14.3 10/12/2014 0859   BUN 15 07/12/2014 1158   CREATININE 1.0 10/12/2014 0859   CREATININE 0.92 07/12/2014 1158   CALCIUM 9.4 07/12/2014 1158   PROT 7.4 07/12/2014 1158   ALBUMIN 4.0 07/12/2014 1158   AST 14 07/12/2014 1158   ALT 17 07/12/2014 1158   ALKPHOS 67 07/12/2014 1158   BILITOT 0.3 07/12/2014 1158   GFRNONAA 85* 07/12/2014 1158   GFRAA >90 07/12/2014 1158     ASSESSMENT and THERAPY PLAN:   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 He has completed SBRT with Dr. Isidore Moos. CT imaging performed in  February showed ongoing improvement and no new areas of concern. Clinically he is doing fairly well. I have recommended follow-up again in 4 months.  Umbilical hernia  I discussed his hernia with him. I provided him with some reading information about umbilical hernias. I advised him that if he has discomfort in the abdominal area is certainly consultation with the surgeon is not unreasonable. If he has additional concerns or questions I have advised him to call.  All questions were answered. The patient knows to call the clinic with any problems, questions or concerns. We can certainly see the patient much sooner if necessary. This note was electronically signed. Molli Hazard 11/08/2014

## 2014-11-08 NOTE — Progress Notes (Signed)
LABS FOR LDH,CMP,CBCD

## 2014-11-09 ENCOUNTER — Ambulatory Visit (HOSPITAL_COMMUNITY): Payer: Medicare Other

## 2014-11-16 ENCOUNTER — Ambulatory Visit
Admission: RE | Admit: 2014-11-16 | Discharge: 2014-11-16 | Disposition: A | Discharge status: Home / Self Care | Payer: Medicare Other | Source: Ambulatory Visit | Attending: Physician Assistant | Admitting: Physician Assistant

## 2014-11-16 DIAGNOSIS — M545 Low back pain: Secondary | ICD-10-CM | POA: Diagnosis not present

## 2014-11-16 DIAGNOSIS — M4806 Spinal stenosis, lumbar region: Secondary | ICD-10-CM | POA: Diagnosis not present

## 2014-11-16 DIAGNOSIS — M5126 Other intervertebral disc displacement, lumbar region: Secondary | ICD-10-CM | POA: Diagnosis not present

## 2014-11-16 MED ORDER — HYDROCODONE-ACETAMINOPHEN 5-325 MG PO TABS
2.0000 | ORAL_TABLET | Freq: Once | ORAL | Status: AC
Start: 2014-11-16 — End: 2014-11-16
  Administered 2014-11-16: 2 via ORAL

## 2014-11-16 MED ORDER — IOHEXOL 180 MG/ML  SOLN
15.0000 mL | Freq: Once | INTRAMUSCULAR | Status: AC | PRN
  Administered 2014-11-16: 15 mL via INTRATHECAL

## 2014-11-16 MED ORDER — DIAZEPAM 5 MG PO TABS
5.0000 mg | ORAL_TABLET | Freq: Once | ORAL | Status: AC
  Administered 2014-11-16: 5 mg via ORAL

## 2014-11-16 NOTE — Discharge Instructions (Signed)

## 2014-11-27 DIAGNOSIS — S60512A Abrasion of left hand, initial encounter: Secondary | ICD-10-CM | POA: Diagnosis not present

## 2014-11-27 DIAGNOSIS — J301 Allergic rhinitis due to pollen: Secondary | ICD-10-CM | POA: Diagnosis not present

## 2014-11-27 DIAGNOSIS — G894 Chronic pain syndrome: Secondary | ICD-10-CM | POA: Diagnosis not present

## 2014-11-27 DIAGNOSIS — Z6824 Body mass index (BMI) 24.0-24.9, adult: Secondary | ICD-10-CM | POA: Diagnosis not present

## 2014-11-28 ENCOUNTER — Encounter (HOSPITAL_COMMUNITY): Payer: Self-pay | Admitting: Hematology & Oncology

## 2015-01-11 ENCOUNTER — Encounter (HOSPITAL_COMMUNITY): Payer: Self-pay

## 2015-01-11 ENCOUNTER — Ambulatory Visit (HOSPITAL_COMMUNITY)
Admission: RE | Admit: 2015-01-11 | Discharge: 2015-01-11 | Disposition: A | Discharge status: Home / Self Care | Payer: Medicare Other | Source: Ambulatory Visit | Attending: Radiation Oncology | Admitting: Radiation Oncology

## 2015-01-11 ENCOUNTER — Ambulatory Visit (HOSPITAL_BASED_OUTPATIENT_CLINIC_OR_DEPARTMENT_OTHER)
Admission: RE | Admit: 2015-01-11 | Discharge: 2015-01-11 | Disposition: A | Discharge status: Home / Self Care | Payer: Medicare Other | Source: Ambulatory Visit | Attending: Radiation Oncology | Admitting: Radiation Oncology

## 2015-01-11 DIAGNOSIS — R911 Solitary pulmonary nodule: Secondary | ICD-10-CM | POA: Diagnosis not present

## 2015-01-11 DIAGNOSIS — C3432 Malignant neoplasm of lower lobe, left bronchus or lung: Secondary | ICD-10-CM

## 2015-01-11 DIAGNOSIS — Z923 Personal history of irradiation: Secondary | ICD-10-CM | POA: Diagnosis not present

## 2015-01-11 DIAGNOSIS — Z85118 Personal history of other malignant neoplasm of bronchus and lung: Secondary | ICD-10-CM | POA: Insufficient documentation

## 2015-01-11 LAB — BUN AND CREATININE (CC13)
BUN: 20.3 mg/dL (ref 7.0–26.0)
Creatinine: 0.9 mg/dL (ref 0.7–1.3)
EGFR: 84 mL/min/{1.73_m2} — ABNORMAL LOW (ref >90)

## 2015-01-11 MED ORDER — IOHEXOL 300 MG/ML  SOLN
100.0000 mL | Freq: Once | INTRAMUSCULAR | Status: AC | PRN
  Administered 2015-01-11: 80 mL via INTRAVENOUS

## 2015-01-12 ENCOUNTER — Ambulatory Visit
Admission: RE | Admit: 2015-01-12 | Discharge: 2015-01-12 | Disposition: A | Discharge status: Home / Self Care | Payer: Medicare Other | Source: Ambulatory Visit | Attending: Radiation Oncology | Admitting: Radiation Oncology

## 2015-01-12 ENCOUNTER — Encounter: Payer: Self-pay | Admitting: Radiation Oncology

## 2015-01-12 VITALS — BP 152/76 | HR 72 | Temp 98.5°F | Resp 12 | Ht 66.0 in | Wt 163.0 lb

## 2015-01-12 DIAGNOSIS — C3432 Malignant neoplasm of lower lobe, left bronchus or lung: Secondary | ICD-10-CM

## 2015-01-12 NOTE — Progress Notes (Signed)
[facility] here for follow up after treatment to his left lower lung.  He reports chronic pain in his lower back and is taking hydrocodone/acetaminophen 10/325 mg 1-2 tablets q 4 hours.  He reports shortness of breath with a lot activity.  He reports that he is coughing due to sinus drainage.  He denies hemoptysis.  He reports a good appetite.  He reports that his energy level is "fair."    BP 152/76 mmHg  Pulse 72  Temp(Src) 98.5 F (36.9 C) (Oral)  Resp 12  Ht '5\' 6"'$  (1.676 m)  Wt 163 lb (73.936 kg)  BMI 26.32 kg/m2  SpO2 97%

## 2015-01-12 NOTE — Progress Notes (Signed)
Radiation Oncology         [phone] ________________________________  Name: Maurice Orozco MRN: [id number]  Date: 01/12/2015  DOB: 1946-06-11  Follow-Up Visit Note CC: Collene Mares MD  Outpatient  CC: Leonides Grills, MD  Farrel Gobble, MD  Diagnosis and Prior Radiotherapy:    ICD-9-CM ICD-10-CM   1. Left lower lobe nodule suspicious for primary lung carcinoma 162.5 C34.32 NM PET Image Restag (PS) Skull Base To Thigh    Left lower lobe nodule suspicious for primary lung carcinoma, T1aN0M0 Stage IA  Indication for treatment:  curative    Radiation treatment dates:   07/04/2014, 07/06/2014, 07/10/2014  Site/dose:   Left lower lung nodule / 54 Gy in 3 fractions given every other day  Beams/energy:   SBRT 3D  //// 6 MV FFF  Narrative:  The patient returns today for routine follow-up after treatment to his left lower lung. He reports chronic pain in his lower back and is taking hydrocodone/acetaminophen 10/325 mg 1-2 tablets q 4 hours. He reports shortness of breath with a lot activity. He reports that he is coughing due to sinus drainage. He denies hemoptysis. He reports a good appetite. He reports that his energy level is "fair." The pt does not smoke, but admits he is around second hand smoke. Denies weight loss.  ALLERGIES:  is allergic to fentanyl and aspirin.  Meds: Current Outpatient Prescriptions  Medication Sig Dispense Refill  . albuterol (PROVENTIL HFA;VENTOLIN HFA) 108 (90 BASE) MCG/ACT inhaler Inhale 2 puffs into the lungs every 6 (six) hours as needed for wheezing or shortness of breath.     . ALPRAZolam (XANAX) 0.5 MG tablet Take 0.5 mg by mouth at bedtime as needed for sleep.     . Cimetidine (TAGAMET PO) Take 1 tablet by mouth as needed (stomach). Taking about 8 daily    . fluticasone (FLONASE) 50 MCG/ACT nasal spray Place 2 sprays into the nose as needed for allergies.     . hydrochlorothiazide 25 MG tablet Take 25 mg by mouth daily. TAKES 1/2 TABLET DAILY      . HYDROcodone-acetaminophen (NORCO) 10-325 MG per tablet Take 1 tablet by mouth every 6 (six) hours as needed. TAKES 1 - 2 EVERY 4 HOURS FOR BACK PAIN     . pantoprazole (PROTONIX) 40 MG tablet Take 40 mg by mouth daily.    . simvastatin (ZOCOR) 40 MG tablet Take 40 mg by mouth daily at 6 PM.      No current facility-administered medications for this encounter.    Physical Findings: The patient is in no acute distress. Patient is alert and oriented.  height is '5\' 6"'$  (1.676 m) and weight is 163 lb (73.936 kg). His oral temperature is 98.5 F (36.9 C). His blood pressure is 152/76 and his pulse is 72. His respiration is 12 and oxygen saturation is 97%.   General: Alert and oriented, in no acute distress HEENT: Head is normocephalic. Extraocular movements are intact. Oropharynx is clear. Neck: no adenopathy Heart: Regular in rate and rhythm with no murmurs, rubs, or gallops. Chest: Clear to auscultation bilaterally, with no rhonchi, wheezes, or rales. Abdomen: Soft, nontender, nondistended, with no rigidity or guarding. Extremities: No cyanosis or edema. Psychiatric: Judgment and insight are intact. Affect is appropriate.   Lab Findings: Lab Results  Component Value Date   WBC 9.4 11/08/2014   HGB 13.4 11/08/2014   HCT 39.8 11/08/2014   MCV 98.8 11/08/2014   PLT 250 11/08/2014    Radiographic  Findings: Ct Chest W Contrast  01/11/2015   CLINICAL DATA:  Right lung cancer diagnosed in 2011. Left lung cancer diagnosed in November 2015. History of bilateral partial lung resection with radiation therapy completed. Subsequent encounter.  EXAM: CT CHEST WITH CONTRAST  TECHNIQUE: Multidetector CT imaging of the chest was performed during intravenous contrast administration.  CONTRAST:  64m OMNIPAQUE IOHEXOL 300 MG/ML  SOLN  COMPARISON:  Chest CT 10/12/2014 and 05/10/2014.  PET-CT 06/02/2014.  FINDINGS: Mediastinum/Nodes: There is questionable slight enlargement of a left hilar lymph node,  measuring 9 mm on image 32. Interval change is less obvious on the reformatted images. There are no enlarged mediastinal or supraclavicular lymph nodes. A small hiatal hernia appears stable. The thyroid gland appears unremarkable. The heart size is normal. There is no pericardial effusion.There is stable atherosclerosis of the aorta, great vessels and coronary arteries.  Lungs/Pleura: There is no pleural effusion. There are stable postsurgical changes status post right upper lobe resection. There are no obvious postsurgical changes in the left lung. Moderate diffuse emphysematous changes are present. There are no suspicious findings in the right lung. There has been interval enlargement of the perifissural left lower lobe pulmonary nodule, measuring 10 mm on image 37. This was hypermetabolic on prior PET-CT. There is minimal new additional nodularity more inferiorly along the left major fissure. Left upper lobe densities on images 8 and 16 are stable. There are no other suspicious nodules.  Upper abdomen:  Unremarkable.  There is no adrenal mass.  Musculoskeletal/Chest wall: There is no chest wall mass or suspicious osseous finding.  IMPRESSION: 1. Interval enlargement of left lower lobe perifissural nodule worrisome for progressive disease. 2. Questionable minimal enlargement of a left hilar node, not definite. No other adenopathy.   Electronically Signed   By: WRichardean SaleM.D.   On: 01/11/2015 10:53    Impression/Plan:  discussed and reviewed CT images with pt. Scheduled a PET scan on 01/24/15 to verify level of suspicioun for the left lower lobe nodule. It could just appear larger due to where it fell in the axial CT slices compared to last time's scan, but cannot rule out progressive cancer after SBRT.  I will put the pt on a tumor board for discussion. Gave a card to the pt if he has any questions or concerns.  This document serves as a record of services personally performed by SEppie Gibson MD. It was  created on her behalf by JDarcus Austin a trained medical scribe. The creation of this record is based on the scribe's personal observations and the provider's statements to them. This document has been checked and approved by the attending provider.    _____________________________________   SEppie Gibson MD

## 2015-01-24 ENCOUNTER — Ambulatory Visit (HOSPITAL_COMMUNITY)
Admission: RE | Admit: 2015-01-24 | Discharge: 2015-01-24 | Disposition: A | Discharge status: Home / Self Care | Payer: Medicare Other | Source: Ambulatory Visit | Attending: Radiation Oncology | Admitting: Radiation Oncology

## 2015-01-24 DIAGNOSIS — C3432 Malignant neoplasm of lower lobe, left bronchus or lung: Secondary | ICD-10-CM

## 2015-01-24 DIAGNOSIS — Z85118 Personal history of other malignant neoplasm of bronchus and lung: Secondary | ICD-10-CM | POA: Diagnosis not present

## 2015-01-24 DIAGNOSIS — C3490 Malignant neoplasm of unspecified part of unspecified bronchus or lung: Secondary | ICD-10-CM | POA: Diagnosis not present

## 2015-01-24 LAB — GLUCOSE, CAPILLARY: Glucose-Capillary: 103 mg/dL — ABNORMAL HIGH (ref 65–99)

## 2015-01-24 MED ORDER — FLUDEOXYGLUCOSE F - 18 (FDG) INJECTION: 8.0000 | Freq: Once | INTRAVENOUS | Status: AC | PRN

## 2015-01-25 ENCOUNTER — Encounter: Payer: Self-pay | Admitting: *Deleted

## 2015-01-25 NOTE — Progress Notes (Signed)
Oncology Nurse Navigator Documentation  Oncology Nurse Navigator Flowsheets 01/25/2015  Navigator Encounter Type Other.  Dr. Isidore Moos request patient be discussed at thoracic cancer conference.  Dr. Roxan Hockey stated he would be will to see the patient to evaluate.  Patient needs PFT's before appt with Dr. Roxan Hockey.  I notified Dr. Isidore Moos of update.   Treatment Phase Other  Interventions Coordination of Care  Time Spent with Patient 15

## 2015-01-29 ENCOUNTER — Telehealth: Payer: Self-pay | Admitting: *Deleted

## 2015-01-29 ENCOUNTER — Other Ambulatory Visit: Payer: Self-pay | Admitting: Radiation Oncology

## 2015-01-29 DIAGNOSIS — C3432 Malignant neoplasm of lower lobe, left bronchus or lung: Secondary | ICD-10-CM

## 2015-01-29 NOTE — Progress Notes (Signed)
Discussed PET results and [place] plan with patient.  Norton Blizzard will arrange PFTs and appt with Dr.  Roxan Hockey.  In case observation is chosen as an option by patient (which is reasonable to me, given minimal SUV uptake and the fact that the nodule is not clearly progressing; size may be changing from scan to scan based on where is falls in axial slices), I ordered f/u with CT imaging in 72mo for pt to see me post-imaging.  Pt is agreeable to plan.  -----------------------------------  SEppie Gibson MD

## 2015-01-29 NOTE — Telephone Encounter (Signed)
CALLED PATIENT TO INFORM OF LAB, CT AND FU VISIT SPOKE WITH PATIENT AND HE IS AWARE OF THESE APPTS.

## 2015-02-06 ENCOUNTER — Telehealth: Payer: Self-pay | Admitting: *Deleted

## 2015-02-06 ENCOUNTER — Encounter: Payer: Self-pay | Admitting: *Deleted

## 2015-02-06 ENCOUNTER — Other Ambulatory Visit: Payer: Self-pay | Admitting: *Deleted

## 2015-02-06 DIAGNOSIS — C3432 Malignant neoplasm of lower lobe, left bronchus or lung: Secondary | ICD-10-CM

## 2015-02-06 NOTE — Telephone Encounter (Signed)
Oncology Nurse Navigator Documentation  Oncology Nurse Navigator Flowsheets 02/06/2015  Navigator Encounter Type Coordination of Care  Treatment Phase   Interventions Coordination of Care/  Received appt from [facility].  I called patient to update.  Patient does not want to see surgeon, he wants to wait a few months and see how he does.  I will update Dr. Isidore Moos and Dr. Roxan Hockey.    Coordination of Care MD Appointments  Time Spent with Patient 15

## 2015-02-06 NOTE — Progress Notes (Signed)
Oncology Nurse Navigator Documentation  Oncology Nurse Navigator Flowsheets 02/06/2015  Navigator Encounter Type Other/Recieved notification today regarding appt for patient to see Dr. Roxan Hockey.  I completed order for PFT's.  I called resp care dept to schedule.  I left a vm message to call me to schedule.   Treatment Phase Pre  Interventions Coordination of Care  Coordination of Care MD Appointments  Time Spent with Patient 15

## 2015-02-13 ENCOUNTER — Ambulatory Visit (HOSPITAL_COMMUNITY): Payer: Medicare Other | Admitting: Hematology & Oncology

## 2015-02-13 ENCOUNTER — Other Ambulatory Visit (HOSPITAL_COMMUNITY): Payer: Medicare Other

## 2015-02-13 ENCOUNTER — Encounter: Payer: Medicare Other | Admitting: Thoracic Surgery (Cardiothoracic Vascular Surgery)

## 2015-02-15 DIAGNOSIS — G4700 Insomnia, unspecified: Secondary | ICD-10-CM | POA: Diagnosis not present

## 2015-02-15 DIAGNOSIS — G894 Chronic pain syndrome: Secondary | ICD-10-CM | POA: Diagnosis not present

## 2015-02-15 DIAGNOSIS — H6990 Unspecified Eustachian tube disorder, unspecified ear: Secondary | ICD-10-CM | POA: Diagnosis not present

## 2015-02-15 DIAGNOSIS — Z6825 Body mass index (BMI) 25.0-25.9, adult: Secondary | ICD-10-CM | POA: Diagnosis not present

## 2015-03-05 ENCOUNTER — Other Ambulatory Visit: Payer: Self-pay

## 2015-03-13 ENCOUNTER — Ambulatory Visit (HOSPITAL_COMMUNITY): Payer: Medicare Other | Admitting: Hematology & Oncology

## 2015-03-13 ENCOUNTER — Other Ambulatory Visit (HOSPITAL_COMMUNITY): Payer: Medicare Other

## 2015-03-29 ENCOUNTER — Ambulatory Visit (HOSPITAL_BASED_OUTPATIENT_CLINIC_OR_DEPARTMENT_OTHER)
Admission: RE | Admit: 2015-03-29 | Discharge: 2015-03-29 | Disposition: A | Discharge status: Home / Self Care | Payer: Medicare Other | Source: Ambulatory Visit | Attending: Radiation Oncology | Admitting: Radiation Oncology

## 2015-03-29 ENCOUNTER — Encounter (HOSPITAL_COMMUNITY): Payer: Self-pay

## 2015-03-29 ENCOUNTER — Ambulatory Visit (HOSPITAL_COMMUNITY)
Admission: RE | Admit: 2015-03-29 | Discharge: 2015-03-29 | Disposition: A | Discharge status: Home / Self Care | Payer: Medicare Other | Source: Ambulatory Visit | Attending: Radiation Oncology | Admitting: Radiation Oncology

## 2015-03-29 DIAGNOSIS — R911 Solitary pulmonary nodule: Secondary | ICD-10-CM | POA: Diagnosis not present

## 2015-03-29 DIAGNOSIS — J984 Other disorders of lung: Secondary | ICD-10-CM | POA: Diagnosis not present

## 2015-03-29 DIAGNOSIS — Z85118 Personal history of other malignant neoplasm of bronchus and lung: Secondary | ICD-10-CM | POA: Diagnosis not present

## 2015-03-29 DIAGNOSIS — C3432 Malignant neoplasm of lower lobe, left bronchus or lung: Secondary | ICD-10-CM

## 2015-03-29 DIAGNOSIS — C3492 Malignant neoplasm of unspecified part of left bronchus or lung: Secondary | ICD-10-CM | POA: Diagnosis not present

## 2015-03-29 DIAGNOSIS — C3491 Malignant neoplasm of unspecified part of right bronchus or lung: Secondary | ICD-10-CM | POA: Diagnosis not present

## 2015-03-29 LAB — BUN AND CREATININE (CC13)
BUN: 18.2 mg/dL (ref 7.0–26.0)
Creatinine: 0.8 mg/dL (ref 0.7–1.3)
EGFR: >90 mL/min/{1.73_m2} (ref >90)

## 2015-03-29 MED ORDER — IOHEXOL 300 MG/ML  SOLN
100.0000 mL | Freq: Once | INTRAMUSCULAR | Status: AC | PRN
  Administered 2015-03-29: 80 mL via INTRAVENOUS

## 2015-03-30 ENCOUNTER — Ambulatory Visit
Admission: RE | Admit: 2015-03-30 | Discharge: 2015-03-30 | Disposition: A | Discharge status: Home / Self Care | Payer: Medicare Other | Source: Ambulatory Visit | Attending: Radiation Oncology | Admitting: Radiation Oncology

## 2015-03-30 VITALS — BP 162/72 | HR 72 | Temp 98.5°F | Resp 20 | Wt 161.3 lb

## 2015-03-30 DIAGNOSIS — C3432 Malignant neoplasm of lower lobe, left bronchus or lung: Secondary | ICD-10-CM | POA: Diagnosis not present

## 2015-03-30 NOTE — Progress Notes (Signed)
Radiation Oncology         [phone] ________________________________  Name: OSWELL SAY MRN: [id number]  Date: 03/30/2015  DOB: 02/17/1946  Follow-Up Visit Note CC: Collene Mares MD  Outpatient  CC: Leonides Grills, MD  Farrel Gobble, MD  Diagnosis and Prior Radiotherapy:    ICD-9-CM ICD-10-CM   1. Left lower lobe nodule suspicious for primary lung carcinoma 162.5 C34.32     Left lower lobe nodule suspicious for primary lung carcinoma, T1aN0M0 Stage IA  Indication for treatment:  curative    Radiation treatment dates:   07/04/2014, 07/06/2014, 07/10/2014  Site/dose:   Left lower lung nodule / 54 Gy in 3 fractions given every other day  Beams/energy:   SBRT 3D  //// 6 MV FFF  Narrative:  The patient returns today for routine follow-up after treatment to his left lower lung. Denies pain. Denies change in his shortness of breath. He has a cough due to nasal congestion. He is also here to review his CT chest results from 03/29/15.  ALLERGIES:  is allergic to fentanyl and aspirin.  Meds: Current Outpatient Prescriptions  Medication Sig Dispense Refill  . albuterol (PROVENTIL HFA;VENTOLIN HFA) 108 (90 BASE) MCG/ACT inhaler Inhale 2 puffs into the lungs every 6 (six) hours as needed for wheezing or shortness of breath.     . ALPRAZolam (XANAX) 0.5 MG tablet Take 0.5 mg by mouth at bedtime as needed for sleep.     . Cimetidine (TAGAMET PO) Take 1 tablet by mouth as needed (stomach). Taking about 8 daily    . fluticasone (FLONASE) 50 MCG/ACT nasal spray Place 2 sprays into the nose as needed for allergies.     . hydrochlorothiazide 25 MG tablet Take 25 mg by mouth daily. TAKES 1/2 TABLET DAILY     . HYDROcodone-acetaminophen (NORCO) 10-325 MG per tablet Take 1 tablet by mouth every 6 (six) hours as needed. TAKES 1 - 2 EVERY 4 HOURS FOR BACK PAIN     . pantoprazole (PROTONIX) 40 MG tablet Take 40 mg by mouth daily.    . simvastatin (ZOCOR) 40 MG tablet Take 40 mg by mouth  daily at 6 PM.      No current facility-administered medications for this encounter.    Physical Findings: The patient is in no acute distress. Patient is alert and oriented.  weight is 161 lb 4.8 oz (73.165 kg). His temperature is 98.5 F (36.9 C). His blood pressure is 162/72 and his pulse is 72. His respiration is 20 and oxygen saturation is 97%.   General: Alert and oriented, in no acute distress HEENT: Head is normocephalic. Extraocular movements are intact. Oropharynx is clear. Neck: No palpable supraclavicular or cervical adenopathy. Palpable left posterior neck subcutaneous nodule ~1cm consistent with lipoma or a cyst. Heart: Regular in rate and rhythm with no murmurs, rubs, or gallops. Chest: Clear to auscultation bilaterally, with no rhonchi, wheezes, or rales. Abdomen: Soft, nontender, nondistended, with no rigidity or guarding. Extremities: No cyanosis or edema in UE's. Psychiatric: Judgment and insight are intact. Affect is appropriate.  Lab Findings: Lab Results  Component Value Date   WBC 9.4 11/08/2014   HGB 13.4 11/08/2014   HCT 39.8 11/08/2014   MCV 98.8 11/08/2014   PLT 250 11/08/2014    Radiographic Findings: Ct Chest W Contrast  03/29/2015   CLINICAL DATA:  Right lung cancer diagnosed in 2011 and left lung cancer diagnosed in 2015. History of right upper and left lower lobectomy. Radiation therapy completed 18  months ago. Subsequent encounter.  EXAM: CT CHEST WITH CONTRAST  TECHNIQUE: Multidetector CT imaging of the chest was performed during intravenous contrast administration.  CONTRAST:  20m OMNIPAQUE IOHEXOL 300 MG/ML  SOLN  COMPARISON:  PET-CT 01/24/2015.  Chest CT 01/11/2015 and 10/12/2014.  FINDINGS: Mediastinum/Nodes: There are no enlarged mediastinal, hilar or axillary lymph nodes.Fat containing low right paratracheal lymph node is unchanged. Stable moderate size hiatal hernia. The heart size is normal. There is no pericardial effusion. Stable mild  atherosclerosis of the aorta, great vessels and coronary arteries.  Lungs/Pleura: There is no pleural effusion. There are stable postsurgical changes status post right upper lobe resection. There is new ill-defined airspace opacity laterally in the right lower lobe (image number 43). No focal nodules are identified within the right lung. Left lower lobe volume loss, fissural thickening and surrounding scarring are stable. The perifissural nodule left lower lobe nodule has not significantly changed, measuring 9 mm on image 37. Diffuse emphysematous changes are stable.  Upper abdomen:  Unremarkable.  There is no adrenal mass.  Musculoskeletal/Chest wall: There is no chest wall mass or suspicious osseous finding.  IMPRESSION: 1. Stable perifissural nodule on the left with adjacent fissural thickening. 2. New patchy airspace disease in the right lower lobe, likely inflammatory. 3. No adenopathy or pleural effusion. 4. Continued CT follow-up recommended in 3-6 months.   Electronically Signed   By: WRichardean SaleM.D.   On: 03/29/2015 14:39    Impression/Plan: The nodule in the left lower lobe is stable and likely benign. Inflammatory changes likely in the right lung. There is no evidence of disease at this time. Schedule a chest CT in 3 months with a f/u with me afterwards.  This document serves as a record of services personally performed by SEppie Gibson MD. It was created on her behalf by JDarcus Austin a trained medical scribe. The creation of this record is based on the scribe's personal observations and the provider's statements to them. This document has been checked and approved by the attending provider.    _____________________________________   SEppie Gibson MD

## 2015-03-30 NOTE — Progress Notes (Signed)
Routine follow up radiation/SBR m3 fractions  to left lower lung completed 07/10/2014.Denies pain.No change in shortness of breath.Cough due to nasal congestion.To review ct chest results 03/29/15.

## 2015-04-02 ENCOUNTER — Ambulatory Visit (HOSPITAL_COMMUNITY): Payer: Medicare Other | Admitting: Hematology & Oncology

## 2015-04-09 ENCOUNTER — Ambulatory Visit (HOSPITAL_COMMUNITY): Payer: Medicare Other | Admitting: Hematology & Oncology

## 2015-04-09 ENCOUNTER — Other Ambulatory Visit (HOSPITAL_COMMUNITY): Payer: Medicare Other

## 2015-04-19 ENCOUNTER — Encounter (HOSPITAL_COMMUNITY): Payer: Medicare Other

## 2015-04-19 ENCOUNTER — Encounter (HOSPITAL_COMMUNITY): Payer: Medicare Other | Attending: Hematology & Oncology | Admitting: Hematology & Oncology

## 2015-04-19 ENCOUNTER — Encounter (HOSPITAL_COMMUNITY): Payer: Self-pay | Admitting: Hematology & Oncology

## 2015-04-19 VITALS — BP 134/59 | HR 89 | Temp 98.8°F | Resp 16 | Wt 165.0 lb

## 2015-04-19 DIAGNOSIS — Z87891 Personal history of nicotine dependence: Secondary | ICD-10-CM | POA: Insufficient documentation

## 2015-04-19 DIAGNOSIS — C3432 Malignant neoplasm of lower lobe, left bronchus or lung: Secondary | ICD-10-CM | POA: Insufficient documentation

## 2015-04-19 DIAGNOSIS — C3490 Malignant neoplasm of unspecified part of unspecified bronchus or lung: Secondary | ICD-10-CM | POA: Diagnosis not present

## 2015-04-19 DIAGNOSIS — C3491 Malignant neoplasm of unspecified part of right bronchus or lung: Secondary | ICD-10-CM

## 2015-04-19 LAB — COMPREHENSIVE METABOLIC PANEL
ALK PHOS: 54 U/L (ref 38–126)
ALT: 23 U/L (ref 17–63)
AST: 20 U/L (ref 15–41)
Albumin: 3.9 g/dL (ref 3.5–5.0)
Anion gap: 9 (ref 5–15)
BILIRUBIN TOTAL: 0.5 mg/dL (ref 0.3–1.2)
BUN: 17 mg/dL (ref 6–20)
CHLORIDE: 98 mmol/L — AB (ref 101–111)
CO2: 29 mmol/L (ref 22–32)
Calcium: 8.3 mg/dL — ABNORMAL LOW (ref 8.9–10.3)
Creatinine, Ser: 0.79 mg/dL (ref 0.61–1.24)
Glucose, Bld: 117 mg/dL — ABNORMAL HIGH (ref 65–99)
Potassium: 3.9 mmol/L (ref 3.5–5.1)
SODIUM: 136 mmol/L (ref 135–145)
Total Protein: 6.9 g/dL (ref 6.5–8.1)

## 2015-04-19 LAB — CBC WITH DIFFERENTIAL/PLATELET
BASOS PCT: 0 % (ref 0–1)
Basophils Absolute: 0 10*3/uL (ref 0.0–0.1)
EOS PCT: 3 % (ref 0–5)
Eosinophils Absolute: 0.3 10*3/uL (ref 0.0–0.7)
HEMATOCRIT: 39.1 % (ref 39.0–52.0)
Hemoglobin: 12.8 g/dL — ABNORMAL LOW (ref 13.0–17.0)
Lymphocytes Relative: 12 % (ref 12–46)
Lymphs Abs: 1.3 10*3/uL (ref 0.7–4.0)
MCH: 32.5 pg (ref 26.0–34.0)
MCHC: 32.7 g/dL (ref 30.0–36.0)
MCV: 99.2 fL (ref 78.0–100.0)
MONO ABS: 0.8 10*3/uL (ref 0.1–1.0)
Monocytes Relative: 8 % (ref 3–12)
NEUTROS ABS: 8.6 10*3/uL — AB (ref 1.7–7.7)
Neutrophils Relative %: 77 % (ref 43–77)
Platelets: 220 10*3/uL (ref 150–400)
RBC: 3.94 MIL/uL — ABNORMAL LOW (ref 4.22–5.81)
RDW: 12.7 % (ref 11.5–15.5)
WBC: 11.1 10*3/uL — AB (ref 4.0–10.5)

## 2015-04-19 MED ORDER — AZITHROMYCIN 250 MG PO TABS: ORAL_TABLET | ORAL | Status: DC

## 2015-04-19 NOTE — Patient Instructions (Signed)
..  [place] at [facility] Discharge Instructions  RECOMMENDATIONS MADE BY THE CONSULTANT AND ANY TEST RESULTS WILL BE SENT TO YOUR REFERRING PHYSICIAN.  You will return in six months for follow up.    Thank you for choosing [place] at [facility] to provide your oncology and hematology care.  To afford each patient quality time with our provider, please arrive at least 15 minutes before your scheduled appointment time.    You need to re-schedule your appointment should you arrive 10 or more minutes late.  We strive to give you quality time with our providers, and arriving late affects you and other patients whose appointments are after yours.  Also, if you no show three or more times for appointments you may be dismissed from the clinic at the providers discretion.     Again, thank you for choosing [facility].  Our hope is that these requests will decrease the amount of time that you wait before being seen by our physicians.       _____________________________________________________________  Should you have questions after your visit to [facility], please contact our office at [phone] between the hours of 8:30 a.m. and 4:30 p.m.  Voicemails left after 4:30 p.m. will not be returned until the following business day.  For prescription refill requests, have your pharmacy contact our office.

## 2015-04-19 NOTE — Progress Notes (Signed)
Maurice Grills, MD [place] [address]    DIAGNOSIS: Adenocarcinoma of lung   Staging form: Lung, AJCC 7th Edition     Clinical: Stage IA (T1a, N0, M0) - Signed by Maurice Cancer, PA on 05/06/2011   SUMMARY OF ONCOLOGIC HISTORY:   Adenocarcinoma of lung   09/30/2010 Orozco Staging CT of chest showed spiculated lesion   10/15/2010 Surgery Right upper lobectomy by Dr. Arlyce Orozco   10/16/2010 Remission    10/24/2013 Imaging CT Chest-  Enlarging subpleural nodule in the left lower lobe, now 9 mm, with distortion of the overlying pleura. Finding is concerning for a small bronchogenic carcinoma   01/23/2014 Imaging CT Chest- Left lower lobe nodule has enlarged from 11/01/2012 and is worrisome for primary bronchogenic carcinoma.    07/04/2014 - 07/10/2014 Radiation Therapy SBRT by Maurice Orozco in 3 fractions.    Left lower lobe nodule suspicious for primary lung carcinoma   06/18/2014 Initial Diagnosis Left lower lobe nodule suspicious for primary lung carcinoma   07/04/2014 - 07/10/2014 Radiation Therapy SBRT   10/12/2014 Imaging No acute findings within the chest. Decrease in size of left lower lobe perifissural nodule compatible with response to therapy. No new or progressive disease identified within the chest.    CURRENT THERAPY: Observation  INTERVAL HISTORY: Maurice Orozco 69 y.o. male returns for follow-up of lung Orozco. He has recently undergone CT imaging of the chest in July for Maurice Orozco. He has no major complaints today. He states he is eating and sleeping well. He is up-to-date on his other well carry including a colonoscopy.   The patient is here with his wife today.  He would like his visits between both here and Maurice Orozco to be spaced out more.  His primary is Dr. Collene Orozco He has only been having back pain. He is eating and sleeping well, he is being active outside.  He has been experiencing nasal drainage and using steroid sprays to alleviate this.  It has  however, caused a small cough with clear phlegm. He denies fever The patient is allergic to Aspirin and Fentanyl. No other major complaints or concerns today.  MEDICAL HISTORY: Past Medical History  Diagnosis Date  . Hypertension   . COPD (chronic obstructive pulmonary disease)   . High cholesterol   . Chronic back pain   . Pneumonia due to Streptococcus     HX. POST OPERATIVELY  . Back fracture     DUE TO AA IN 1970  . Stress fx pelvis     DUE TO AA IN 1970  . Hypertension 05/06/2011  . COPD (chronic obstructive pulmonary disease) 05/06/2011  . Emphysema 05/06/2011  . Hypercholesterolemia 05/06/2011  . Allergy   . Anxiety   . Arthritis     finger  . Emphysema of lung   . GERD (gastroesophageal reflux disease)   . History of radiation therapy 07/04/14, 07/06/14, 07/10/14    LLL lung nodule/54 Gy/3 fx- SBRT  . Orozco     rt lung/dx 2011/surg only  . Lung Orozco   . Adenocarcinoma of lung 05/06/2011    has Back fracture; Adenocarcinoma of lung; Hypertension; COPD (chronic obstructive pulmonary disease); Emphysema; Hypercholesterolemia; Left lower lobe nodule suspicious for primary lung carcinoma; GERD (gastroesophageal reflux disease); and Nausea without vomiting on his problem list.     is allergic to fentanyl and aspirin.  Maurice Orozco does not currently have medications on file.  SURGICAL HISTORY: Past Surgical History  Procedure  Laterality Date  . Back surgery    . Lung lobectomy  2012    RT UPPER LOBE  . Back surgery  2011    metal plate l-4,L5    SOCIAL HISTORY: Social History   Social History  . Marital Status: Married    Spouse Name: N/A  . Number of Children: 3  . Years of Education: N/A   Occupational History  . Not on file.   Social History Main Topics  . Smoking status: Former Smoker    Types: Cigarettes    Quit date: 09/08/2010  . Smokeless tobacco: Current User    Types: Chew     Comment: still chews tobacco  . Alcohol Use: No  . Drug Use: No   . Sexual Activity: Not Currently   Other Topics Concern  . Not on file   Social History Narrative    FAMILY HISTORY: Family History  Problem Relation Age of Onset  . Hypertension Mother   . Kidney disease Father   . Heart attack Brother   . Orozco Maternal Uncle     prostate Orozco  . Orozco Maternal Uncle     prostate Orozco  . Orozco Maternal Uncle     prostate Orozco  . Orozco Maternal Uncle     prostate Orozco    Review of Systems  Constitutional: Positive for malaise/fatigue. Negative for fever, chills and weight loss.  HENT: Negative for congestion, hearing loss, nosebleeds, sore throat and tinnitus.   Eyes: Negative for blurred vision, double vision, pain and discharge.  Respiratory: Negative for hemoptysis, sputum production, shortness of breath and wheezing.  Positive for cough Cardiovascular: Negative for chest pain, palpitations, claudication, leg swelling and PND.  Gastrointestinal: Negative for heartburn, nausea, vomiting, abdominal pain, diarrhea, constipation, blood in stool and melena.       "puffy belly button"  Genitourinary: Negative for dysuria, urgency, frequency and hematuria.  Musculoskeletal: Negative for myalgias, joint pain and falls.  Skin: Negative for itching and rash.  Neurological: Negative for dizziness, tingling, tremors, sensory change, speech change, focal weakness, seizures, loss of consciousness, weakness and headaches.  Endo/Heme/Allergies: Does not bruise/bleed easily.  Psychiatric/Behavioral: Negative for depression, suicidal ideas, memory loss and substance abuse. The patient is not nervous/anxious and does not have insomnia.   14 point review of systems was performed and is negative except as detailed under history of present illness and above   PHYSICAL EXAMINATION  ECOG PERFORMANCE STATUS: 1 - Symptomatic but completely ambulatory  Filed Vitals:   04/19/15 0925  BP: 134/59  Pulse: 89  Temp: 98.8 F (37.1 C)  Resp: 16     Physical Exam  Constitutional: He is oriented to person, place, and time and well-developed, well-nourished, and in no distress.  HENT:  Head: Normocephalic and atraumatic.  Nose: Nose normal.  Mouth/Throat: Oropharynx is clear and moist. No oropharyngeal exudate.  Eyes: Conjunctivae and EOM are normal. Pupils are equal, round, and reactive to light. Right eye exhibits no discharge. Left eye exhibits no discharge. No scleral icterus.  Neck: Normal range of motion. Neck supple. No tracheal deviation present. No thyromegaly present.  Cardiovascular: Normal rate, regular rhythm and normal heart sounds.  Exam reveals no gallop and no friction rub. No murmur heard. Pulmonary/Chest: Effort normal and breath sounds normal. He has no wheezes. He has no rales.  Abdominal: Soft. Bowel sounds are normal. He exhibits no distension and no mass. There is no tenderness. There is no rebound and no guarding.  Umbilical hernia  noted on exam, easily reducible  Musculoskeletal: Normal range of motion. He exhibits no edema.  Lymphadenopathy:    He has no cervical adenopathy.  Neurological: He is alert and oriented to person, place, and time. He has normal reflexes. No cranial nerve deficit. Gait normal. Coordination normal.  Skin: Skin is warm and dry. No rash noted.  Psychiatric: Mood, memory, affect and judgment normal.  Nursing note and vitals reviewed.   LABORATORY DATA:  CBC    Component Value Date/Time   WBC 11.1* 04/19/2015 0900   RBC 3.94* 04/19/2015 0900   HGB 12.8* 04/19/2015 0900   HCT 39.1 04/19/2015 0900   PLT 220 04/19/2015 0900   MCV 99.2 04/19/2015 0900   MCH 32.5 04/19/2015 0900   MCHC 32.7 04/19/2015 0900   RDW 12.7 04/19/2015 0900   LYMPHSABS 1.3 04/19/2015 0900   MONOABS 0.8 04/19/2015 0900   EOSABS 0.3 04/19/2015 0900   BASOSABS 0.0 04/19/2015 0900   CMP     Component Value Date/Time   NA 136 04/19/2015 0900   K 3.9 04/19/2015 0900   CL 98* 04/19/2015 0900   CO2 29  04/19/2015 0900   GLUCOSE 117* 04/19/2015 0900   BUN 17 04/19/2015 0900   BUN 18.2 03/29/2015 1318   CREATININE 0.79 04/19/2015 0900   CREATININE 0.8 03/29/2015 1318   CALCIUM 8.3* 04/19/2015 0900   PROT 6.9 04/19/2015 0900   ALBUMIN 3.9 04/19/2015 0900   AST 20 04/19/2015 0900   ALT 23 04/19/2015 0900   ALKPHOS 54 04/19/2015 0900   BILITOT 0.5 04/19/2015 0900   GFRNONAA >60 04/19/2015 0900   GFRAA >60 04/19/2015 0900   RADIOLOGY: CLINICAL DATA: Right lung Orozco diagnosed in 2011 and left lung Orozco diagnosed in 2015. History of right upper and left lower lobectomy. Radiation therapy completed 18 months ago. Subsequent encounter.  EXAM: CT CHEST WITH CONTRAST  TECHNIQUE: Multidetector CT imaging of the chest was performed during intravenous contrast administration.  CONTRAST: 82m OMNIPAQUE IOHEXOL 300 MG/ML SOLN  COMPARISON: PET-CT 01/24/2015. Chest CT 01/11/2015 and 10/12/2014. IMPRESSION: 1. Stable perifissural nodule on the left with adjacent fissural thickening. 2. New patchy airspace disease in the right lower lobe, likely inflammatory. 3. No adenopathy or pleural effusion. 4. Continued CT follow-up recommended in 3-6 months.   Electronically Signed  By: WRichardean SaleM.D.  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 He has completed SBRT with Dr. SIsidore Orozco CT imaging performed in July was reviewed with the patient. Clinically he is doing fairly well. I have recommended follow-up again in 6 months per his request.  Cough Leukocytosis, mild  I have recommended Zithromax for 5 days based upon his cough, mild leukocytosis and findings on CT of some mild airspace disease in the right lower lobe. I advised him if his symptoms worsen, he develops fever to let [place] know.  Umbilical hernia  I discussed his hernia with him. I provided him with some reading information about umbilical hernias. I advised him that if he has discomfort in the abdominal area is certainly consultation with the surgeon is not unreasonable. If he has additional concerns or questions I have advised him to call.  All questions were answered. The patient knows to call the clinic with any problems, questions or concerns. We can certainly see the patient much sooner if necessary.  This document serves as a record of services personally performed by Ancil Linsey, MD. It was created on her behalf by Janace Hoard, a trained medical scribe. The creation of this record is based on the scribe's personal observations and the provider's statements to them. This document has been checked and approved by the attending provider.  I have reviewed the above documentation for accuracy and completeness, and I agree with the above.  This note was electronically signed.  Kelby Fam. Whitney Muse, MD

## 2015-04-24 ENCOUNTER — Other Ambulatory Visit (HOSPITAL_COMMUNITY): Payer: Self-pay | Admitting: Hematology & Oncology

## 2015-04-25 ENCOUNTER — Other Ambulatory Visit (HOSPITAL_COMMUNITY): Payer: Self-pay | Admitting: Hematology & Oncology

## 2015-04-25 MED ORDER — AZITHROMYCIN 250 MG PO TABS: ORAL_TABLET | ORAL | Status: DC

## 2015-05-03 DIAGNOSIS — G894 Chronic pain syndrome: Secondary | ICD-10-CM | POA: Diagnosis not present

## 2015-05-03 DIAGNOSIS — Z1389 Encounter for screening for other disorder: Secondary | ICD-10-CM | POA: Diagnosis not present

## 2015-05-03 DIAGNOSIS — E663 Overweight: Secondary | ICD-10-CM | POA: Diagnosis not present

## 2015-05-03 DIAGNOSIS — M4316 Spondylolisthesis, lumbar region: Secondary | ICD-10-CM | POA: Diagnosis not present

## 2015-05-03 DIAGNOSIS — Z6826 Body mass index (BMI) 26.0-26.9, adult: Secondary | ICD-10-CM | POA: Diagnosis not present

## 2015-05-25 DIAGNOSIS — Z1389 Encounter for screening for other disorder: Secondary | ICD-10-CM | POA: Diagnosis not present

## 2015-05-25 DIAGNOSIS — Z0000 Encounter for general adult medical examination without abnormal findings: Secondary | ICD-10-CM | POA: Diagnosis not present

## 2015-05-25 DIAGNOSIS — M5136 Other intervertebral disc degeneration, lumbar region: Secondary | ICD-10-CM | POA: Diagnosis not present

## 2015-05-25 DIAGNOSIS — R7309 Other abnormal glucose: Secondary | ICD-10-CM | POA: Diagnosis not present

## 2015-05-25 DIAGNOSIS — Z6825 Body mass index (BMI) 25.0-25.9, adult: Secondary | ICD-10-CM | POA: Diagnosis not present

## 2015-05-25 DIAGNOSIS — E782 Mixed hyperlipidemia: Secondary | ICD-10-CM | POA: Diagnosis not present

## 2015-05-25 DIAGNOSIS — E663 Overweight: Secondary | ICD-10-CM | POA: Diagnosis not present

## 2015-05-25 DIAGNOSIS — Z0001 Encounter for general adult medical examination with abnormal findings: Secondary | ICD-10-CM | POA: Diagnosis not present

## 2015-05-25 DIAGNOSIS — Z125 Encounter for screening for malignant neoplasm of prostate: Secondary | ICD-10-CM | POA: Diagnosis not present

## 2015-06-19 DIAGNOSIS — M961 Postlaminectomy syndrome, not elsewhere classified: Secondary | ICD-10-CM | POA: Diagnosis not present

## 2015-06-19 DIAGNOSIS — M5416 Radiculopathy, lumbar region: Secondary | ICD-10-CM | POA: Diagnosis not present

## 2015-07-05 ENCOUNTER — Ambulatory Visit (HOSPITAL_BASED_OUTPATIENT_CLINIC_OR_DEPARTMENT_OTHER)
Admission: RE | Admit: 2015-07-05 | Discharge: 2015-07-05 | Disposition: A | Discharge status: Home / Self Care | Payer: Medicare Other | Source: Ambulatory Visit | Attending: Radiation Oncology | Admitting: Radiation Oncology

## 2015-07-05 ENCOUNTER — Encounter (HOSPITAL_COMMUNITY): Payer: Self-pay

## 2015-07-05 ENCOUNTER — Ambulatory Visit (HOSPITAL_COMMUNITY)
Admission: RE | Admit: 2015-07-05 | Discharge: 2015-07-05 | Disposition: A | Discharge status: Home / Self Care | Payer: Medicare Other | Source: Ambulatory Visit | Attending: Radiation Oncology | Admitting: Radiation Oncology

## 2015-07-05 DIAGNOSIS — Z923 Personal history of irradiation: Secondary | ICD-10-CM | POA: Insufficient documentation

## 2015-07-05 DIAGNOSIS — Z08 Encounter for follow-up examination after completed treatment for malignant neoplasm: Secondary | ICD-10-CM | POA: Diagnosis not present

## 2015-07-05 DIAGNOSIS — C3432 Malignant neoplasm of lower lobe, left bronchus or lung: Secondary | ICD-10-CM | POA: Diagnosis present

## 2015-07-05 DIAGNOSIS — R911 Solitary pulmonary nodule: Secondary | ICD-10-CM | POA: Diagnosis not present

## 2015-07-05 LAB — BUN AND CREATININE (CC13)
BUN: 23.9 mg/dL (ref 7.0–26.0)
Creatinine: 0.9 mg/dL (ref 0.7–1.3)
EGFR: 89 mL/min/{1.73_m2} — ABNORMAL LOW (ref >90)

## 2015-07-05 MED ORDER — IOHEXOL 300 MG/ML  SOLN
75.0000 mL | Freq: Once | INTRAMUSCULAR | Status: AC | PRN
  Administered 2015-07-05: 75 mL via INTRAVENOUS

## 2015-07-06 ENCOUNTER — Ambulatory Visit
Admission: RE | Admit: 2015-07-06 | Discharge: 2015-07-06 | Disposition: A | Discharge status: Home / Self Care | Payer: Medicare Other | Source: Ambulatory Visit | Attending: Radiation Oncology | Admitting: Radiation Oncology

## 2015-07-06 ENCOUNTER — Encounter: Payer: Self-pay | Admitting: Radiation Oncology

## 2015-07-06 VITALS — BP 147/72 | HR 65 | Temp 98.2°F | Ht 66.0 in | Wt 162.1 lb

## 2015-07-06 DIAGNOSIS — C3432 Malignant neoplasm of lower lobe, left bronchus or lung: Secondary | ICD-10-CM

## 2015-07-06 NOTE — Progress Notes (Signed)
Radiation Oncology         [phone] ________________________________  Name: Maurice Orozco MRN: [id number]  Date: 07/06/2015  DOB: [birth year]  Follow-Up Visit Note CC: Collene Mares MD  Outpatient  CC: Leonides Grills, MD  Farrel Gobble, MD  Diagnosis and Prior Radiotherapy:    ICD-9-CM ICD-10-CM   1. Left lower lobe nodule suspicious for primary lung carcinoma 162.5 C34.32     Left lower lobe nodule suspicious for primary lung carcinoma, [id number] Stage IA  Indication for treatment:  curative    Radiation treatment dates:   07/04/2014, 07/06/2014, 07/10/2014  Site/dose:   Left lower lung nodule / 54 Gy in 3 fractions given every other day  Narrative:  The patient returns today for routine follow-up after treatment to his left lower lung. He had a CT scan on 10/27 and is here to get the results today. He reports pain in his back and legs, which he relates to a bulging disc, and a "plate" in his back from a previous surgery. He takes norco for relief of this which helps. He reports his appetite is good and his energy level is normal. He reports he only gets short of breath when he exerts himself. He reports a productive cough last week - viral in nature. He reports chewing tobacco use  ALLERGIES:  is allergic to fentanyl and aspirin.  Meds: Current Outpatient Prescriptions  Medication Sig Dispense Refill  . ALPRAZolam (XANAX) 0.5 MG tablet Take 0.5 mg by mouth at bedtime as needed for sleep.     . Cimetidine (TAGAMET PO) Take 1 tablet by mouth as needed (stomach). Taking about 8 daily    . fluticasone (FLONASE) 50 MCG/ACT nasal spray Place 2 sprays into the nose as needed for allergies.     . hydrochlorothiazide 25 MG tablet Take 25 mg by mouth daily. TAKES 1/2 TABLET DAILY     . HYDROcodone-acetaminophen (NORCO) 10-325 MG per tablet Take 1 tablet by mouth every 6 (six) hours as needed. TAKES 1 - 2 EVERY 4 HOURS FOR BACK PAIN     . pantoprazole (PROTONIX) 40 MG tablet  Take 40 mg by mouth daily.    . simvastatin (ZOCOR) 40 MG tablet Take 40 mg by mouth daily at 6 PM.     . albuterol (PROVENTIL HFA;VENTOLIN HFA) 108 (90 BASE) MCG/ACT inhaler Inhale 2 puffs into the lungs every 6 (six) hours as needed for wheezing or shortness of breath.      No current facility-administered medications for this encounter.    Physical Findings: The patient is in no acute distress. Patient is alert and oriented.  height is '5\' 6"'$  (1.676 m) and weight is 162 lb 1.6 oz (73.528 kg). His temperature is 98.2 F (36.8 C). His blood pressure is 147/72 and his pulse is 65. His oxygen saturation is 98%.   General: Alert and oriented, in no acute distress HEENT: Head is normocephalic. Extraocular movements are intact. Difficulty to depress his tongue to see his oropharynx. No oral masses seen. Neck: No palpable supraclavicular or cervical adenopathy.  Heart: Regular in rate and rhythm  Chest: Clear to auscultation bilaterally, with no rhonchi, wheezes, or rales. Psychiatric: Judgment and insight are intact. Affect is appropriate.  Lab Findings: Lab Results  Component Value Date   WBC 11.1* 04/19/2015   HGB 12.8* 04/19/2015   HCT 39.1 04/19/2015   MCV 99.2 04/19/2015   PLT 220 04/19/2015    Radiographic Findings: Ct Chest W Contrast  07/05/2015  CLINICAL DATA:  Restaging right lung cancer diagnosed in 2012 with right lower lobe resection. Subsequent left lower lobe cancer treated with radiation therapy in 2015. Subsequent encounter. EXAM: CT CHEST WITH CONTRAST TECHNIQUE: Multidetector CT imaging of the chest was performed during intravenous contrast administration. CONTRAST:  59m OMNIPAQUE IOHEXOL 300 MG/ML  SOLN COMPARISON:  CTs dated 03/29/2015 and 01/24/2015. FINDINGS: Mediastinum/Nodes: There are no enlarged mediastinal, hilar or axillary lymph nodes.Small mediastinal lymph nodes are unchanged. There is a stable moderate size hiatal hernia. The heart size is normal. There is  no pericardial effusion. Atherosclerosis of the aorta, great vessels and coronary arteries again noted. Lungs/Pleura: There is no pleural effusion. There are stable postsurgical changes from previous right upper lobe resection. The patchy airspace disease noted in the right lower lobe on the most recent examination has resolved. The right lung is clear. The perifissural nodule on the left appear slightly smaller, measuring 8 mm on image 38. There is surrounding parenchymal density attributed to radiation therapy. Medially in the left lower lobe, there is new patchy airspace disease on images 49-53, likely inflammatory. No new or enlarging pulmonary nodules identified. Diffuse emphysematous changes noted. Upper abdomen: The visualized upper abdomen appears stable. No evidence of adrenal mass. Musculoskeletal/Chest wall: There is no chest wall mass or suspicious osseous finding. IMPRESSION: 1. The perifissural nodule in the left lower lobe appears slightly smaller with surrounding parenchymal opacity attributed to interval radiation therapy. 2. In addition, there is new patchy airspace disease more inferiorly in the left lower lobe, likely inflammatory. 3. The patchy airspace disease noted peripherally in the right lower lobe on the most recent examination has resolved. 4. No evidence of metastatic disease. Electronically Signed   By: WRichardean SaleM.D.   On: 07/05/2015 13:50    Impression/Plan: NED  Schedule a chest CT without contrast in 4 months with a f/u with me afterwards. I explained the importance of tobacco cessation. He is chewing, but not smoking. The patient continues to use tobacco. The patient was counseled to stop using tobacco and was offered pharmacotherapy and further counseling to help with this. The patient declined pharmacotherapy and further counseling at this time.   This document serves as a record of services personally performed by SEppie Gibson MD. It was created on her behalf by  JDarcus Austin a trained medical scribe. The creation of this record is based on the scribe's personal observations and the provider's statements to them. This document has been checked and approved by the attending provider.    _____________________________________   SEppie Gibson MD

## 2015-07-06 NOTE — Progress Notes (Signed)
[name] is here for follow-up of radiation to his Lung, completed 07/10/2014. He had a CT scan 10/27, and is here to get the results today. He reports pain in his back and legs which he relates to a bulging disc, and a "plate" in his back from a previous surgery. He takes norco for relief of this which helps take the pain away. He reports his appetite is good, and his energy level is normal for him. He reports he only gets short of breath when he exerts himself.  BP 147/72 mmHg  Pulse 65  Temp(Src) 98.2 F (36.8 C)  Ht '5\' 6"'$  (1.676 m)  Wt 162 lb 1.6 oz (73.528 kg)  BMI 26.18 kg/m2  SpO2 98%   Wt Readings from Last 3 Encounters:  07/06/15 162 lb 1.6 oz (73.528 kg)  04/19/15 165 lb (74.844 kg)  03/30/15 161 lb 4.8 oz (73.165 kg)

## 2015-07-09 ENCOUNTER — Telehealth: Payer: Self-pay | Admitting: *Deleted

## 2015-07-09 NOTE — Telephone Encounter (Signed)
CALLED PATIENT TO INFORM OF CT AND FU, LVM FOR A RETURN CALL

## 2015-07-10 DIAGNOSIS — M5416 Radiculopathy, lumbar region: Secondary | ICD-10-CM | POA: Diagnosis not present

## 2015-07-24 DIAGNOSIS — G894 Chronic pain syndrome: Secondary | ICD-10-CM | POA: Diagnosis not present

## 2015-07-24 DIAGNOSIS — E663 Overweight: Secondary | ICD-10-CM | POA: Diagnosis not present

## 2015-07-24 DIAGNOSIS — Z1389 Encounter for screening for other disorder: Secondary | ICD-10-CM | POA: Diagnosis not present

## 2015-07-24 DIAGNOSIS — Z6825 Body mass index (BMI) 25.0-25.9, adult: Secondary | ICD-10-CM | POA: Diagnosis not present

## 2015-10-15 DIAGNOSIS — J019 Acute sinusitis, unspecified: Secondary | ICD-10-CM | POA: Diagnosis not present

## 2015-10-15 DIAGNOSIS — E663 Overweight: Secondary | ICD-10-CM | POA: Diagnosis not present

## 2015-10-15 DIAGNOSIS — G894 Chronic pain syndrome: Secondary | ICD-10-CM | POA: Diagnosis not present

## 2015-10-15 DIAGNOSIS — Z6826 Body mass index (BMI) 26.0-26.9, adult: Secondary | ICD-10-CM | POA: Diagnosis not present

## 2015-10-15 DIAGNOSIS — Z1389 Encounter for screening for other disorder: Secondary | ICD-10-CM | POA: Diagnosis not present

## 2015-10-19 ENCOUNTER — Encounter (HOSPITAL_COMMUNITY): Payer: Medicare Other | Attending: Hematology & Oncology | Admitting: Hematology & Oncology

## 2015-10-19 ENCOUNTER — Encounter (HOSPITAL_COMMUNITY): Payer: Self-pay | Admitting: Hematology & Oncology

## 2015-10-19 ENCOUNTER — Encounter (HOSPITAL_COMMUNITY): Payer: Medicare Other

## 2015-10-19 VITALS — BP 144/73 | HR 84 | Temp 98.4°F | Resp 18 | Wt 166.0 lb

## 2015-10-19 DIAGNOSIS — Z923 Personal history of irradiation: Secondary | ICD-10-CM | POA: Insufficient documentation

## 2015-10-19 DIAGNOSIS — F419 Anxiety disorder, unspecified: Secondary | ICD-10-CM | POA: Diagnosis not present

## 2015-10-19 DIAGNOSIS — I10 Essential (primary) hypertension: Secondary | ICD-10-CM | POA: Insufficient documentation

## 2015-10-19 DIAGNOSIS — J449 Chronic obstructive pulmonary disease, unspecified: Secondary | ICD-10-CM | POA: Diagnosis not present

## 2015-10-19 DIAGNOSIS — Z9889 Other specified postprocedural states: Secondary | ICD-10-CM | POA: Diagnosis not present

## 2015-10-19 DIAGNOSIS — Z888 Allergy status to other drugs, medicaments and biological substances status: Secondary | ICD-10-CM | POA: Diagnosis not present

## 2015-10-19 DIAGNOSIS — Z8042 Family history of malignant neoplasm of prostate: Secondary | ICD-10-CM | POA: Insufficient documentation

## 2015-10-19 DIAGNOSIS — K219 Gastro-esophageal reflux disease without esophagitis: Secondary | ICD-10-CM | POA: Insufficient documentation

## 2015-10-19 DIAGNOSIS — Z87891 Personal history of nicotine dependence: Secondary | ICD-10-CM | POA: Diagnosis not present

## 2015-10-19 DIAGNOSIS — C3432 Malignant neoplasm of lower lobe, left bronchus or lung: Secondary | ICD-10-CM

## 2015-10-19 DIAGNOSIS — J438 Other emphysema: Secondary | ICD-10-CM | POA: Insufficient documentation

## 2015-10-19 DIAGNOSIS — G8929 Other chronic pain: Secondary | ICD-10-CM | POA: Insufficient documentation

## 2015-10-19 DIAGNOSIS — C3492 Malignant neoplasm of unspecified part of left bronchus or lung: Secondary | ICD-10-CM

## 2015-10-19 DIAGNOSIS — C7801 Secondary malignant neoplasm of right lung: Secondary | ICD-10-CM | POA: Insufficient documentation

## 2015-10-19 DIAGNOSIS — C3490 Malignant neoplasm of unspecified part of unspecified bronchus or lung: Secondary | ICD-10-CM | POA: Diagnosis not present

## 2015-10-19 DIAGNOSIS — Z841 Family history of disorders of kidney and ureter: Secondary | ICD-10-CM | POA: Insufficient documentation

## 2015-10-19 DIAGNOSIS — E7800 Pure hypercholesterolemia, unspecified: Secondary | ICD-10-CM | POA: Insufficient documentation

## 2015-10-19 DIAGNOSIS — R911 Solitary pulmonary nodule: Secondary | ICD-10-CM | POA: Diagnosis not present

## 2015-10-19 DIAGNOSIS — C3491 Malignant neoplasm of unspecified part of right bronchus or lung: Secondary | ICD-10-CM

## 2015-10-19 DIAGNOSIS — M1990 Unspecified osteoarthritis, unspecified site: Secondary | ICD-10-CM | POA: Diagnosis not present

## 2015-10-19 DIAGNOSIS — K429 Umbilical hernia without obstruction or gangrene: Secondary | ICD-10-CM | POA: Diagnosis not present

## 2015-10-19 LAB — COMPREHENSIVE METABOLIC PANEL
ALBUMIN: 4.3 g/dL (ref 3.5–5.0)
ALK PHOS: 57 U/L (ref 38–126)
ALT: 19 U/L (ref 17–63)
ANION GAP: 9 (ref 5–15)
AST: 16 U/L (ref 15–41)
BUN: 21 mg/dL — ABNORMAL HIGH (ref 6–20)
CHLORIDE: 100 mmol/L — AB (ref 101–111)
CO2: 29 mmol/L (ref 22–32)
Calcium: 9.3 mg/dL (ref 8.9–10.3)
Creatinine, Ser: 0.87 mg/dL (ref 0.61–1.24)
GFR calc non Af Amer: >60 mL/min (ref >60)
GLUCOSE: 86 mg/dL (ref 65–99)
Potassium: 3.7 mmol/L (ref 3.5–5.1)
SODIUM: 138 mmol/L (ref 135–145)
TOTAL PROTEIN: 7.2 g/dL (ref 6.5–8.1)
Total Bilirubin: 0.5 mg/dL (ref 0.3–1.2)

## 2015-10-19 LAB — CBC WITH DIFFERENTIAL/PLATELET
BASOS PCT: 1 %
Basophils Absolute: 0 10*3/uL (ref 0.0–0.1)
EOS ABS: 0.1 10*3/uL (ref 0.0–0.7)
EOS PCT: 1 %
HCT: 38.8 % — ABNORMAL LOW (ref 39.0–52.0)
HEMOGLOBIN: 13 g/dL (ref 13.0–17.0)
Lymphocytes Relative: 17 %
Lymphs Abs: 1.4 10*3/uL (ref 0.7–4.0)
MCH: 32.9 pg (ref 26.0–34.0)
MCHC: 33.5 g/dL (ref 30.0–36.0)
MCV: 98.2 fL (ref 78.0–100.0)
Monocytes Absolute: 0.8 10*3/uL (ref 0.1–1.0)
Monocytes Relative: 10 %
NEUTROS PCT: 71 %
Neutro Abs: 5.8 10*3/uL (ref 1.7–7.7)
PLATELETS: 239 10*3/uL (ref 150–400)
RBC: 3.95 MIL/uL — AB (ref 4.22–5.81)
RDW: 12.6 % (ref 11.5–15.5)
WBC: 8.2 10*3/uL (ref 4.0–10.5)

## 2015-10-19 NOTE — Patient Instructions (Signed)
..  [place] at [facility] Discharge Instructions  RECOMMENDATIONS MADE BY THE CONSULTANT AND ANY TEST RESULTS WILL BE SENT TO YOUR REFERRING PHYSICIAN. Blood work and scans are normal Exam with [place] can alternate with Dr. Pearlie Oyster exams We will see you back in June with lab work Continue miralax  Thank you for choosing [place] at [facility] to provide your oncology and hematology care.  To afford each patient quality time with our provider, please arrive at least 15 minutes before your scheduled appointment time.   Beginning January 23rd 2017 lab work for the [facility] will be done in the  Main lab at [facility] on 1st floor. If you have a lab appointment with the [facility] please come in thru the  Main Entrance and check in at the main information desk  You need to re-schedule your appointment should you arrive 10 or more minutes late.  We strive to give you quality time with our providers, and arriving late affects you and other patients whose appointments are after yours.  Also, if you no show three or more times for appointments you may be dismissed from the clinic at the providers discretion.     Again, thank you for choosing [facility].  Our hope is that these requests will decrease the amount of time that you wait before being seen by our physicians.       _____________________________________________________________  Should you have questions after your visit to [facility] please contact our office at [phone] between the hours of 8:30 a.m. and 4:30 p.m.  Voicemails left after 4:30 p.m. will not be returned until the following business day.  For prescription refill requests, have your pharmacy contact our office.

## 2015-10-19 NOTE — Progress Notes (Signed)
[facility], [place] [address]   DIAGNOSIS: Adenocarcinoma of lung   Staging form: Lung, AJCC 7th Edition     Clinical: Stage IA (T1a, N0, M0) - Signed by Baird Cancer, PA on 05/06/2011   SUMMARY OF ONCOLOGIC HISTORY:   Adenocarcinoma of lung ([facility])   09/30/2010 Cancer Staging CT of chest showed spiculated lesion   10/15/2010 Surgery Right upper lobectomy by Dr. Arlyce Dice   10/16/2010 Remission    10/24/2013 Imaging CT Chest-  Enlarging subpleural nodule in the left lower lobe, now 9 mm, with distortion of the overlying pleura. Finding is concerning for a small bronchogenic carcinoma   01/23/2014 Imaging CT Chest- Left lower lobe nodule has enlarged from 11/01/2012 and is worrisome for primary bronchogenic carcinoma.    07/04/2014 - 07/10/2014 Radiation Therapy SBRT by Dr. Isidore Moos in 3 fractions.    Primary cancer of left lower lobe of lung ([facility])   06/18/2014 Initial Diagnosis Left lower lobe nodule suspicious for primary lung carcinoma   07/04/2014 - 07/10/2014 Radiation Therapy SBRT   10/12/2014 Imaging No acute findings within the chest. Decrease in size of left lower lobe perifissural nodule compatible with response to therapy. No new or progressive disease identified within the chest.    CURRENT THERAPY: Observation  INTERVAL HISTORY: Maurice Orozco 69 y.o. male returns for follow-up of lung cancer. He has recently undergone CT imaging of the chest in July for Dr. Isidore Moos. He has no major complaints today. He states he is eating and sleeping well. He is up-to-date on his other well carry including a colonoscopy.   Maurice Orozco returns to the [facility] alone today. He says that he's been doing pretty good aside from a sinus infection.  He says he has no problems breathing, no problems eating; he confirms that he's eating well. He remarks that he would like to lose weight.  His last CT of the  chest was at the end of October, and he will repeat scans on February 16th. he sees Dr. Isidore Moos again on March 3rd. After this upcoming appointment with Dr. Isidore Moos, he would like to alternate doctor appointments.  During the physical exam, he denies any chest pain, confirms that his bowels are okay, no blood in his stool, and that his fingers and toes feel okay.  He says he's been taking Miralax and that it's "doing really good." He notes that constipation is chronic and unchanged.   MEDICAL HISTORY: Past Medical History  Diagnosis Date  . Hypertension   . COPD (chronic obstructive pulmonary disease) ([facility])   . High cholesterol   . Chronic back pain   . Pneumonia due to Streptococcus     HX. POST OPERATIVELY  . Back fracture     DUE TO AA IN 1970  . Stress fx pelvis     DUE TO AA IN 1970  . Hypertension 05/06/2011  . COPD (chronic obstructive pulmonary disease) ([facility]) 05/06/2011  . Emphysema 05/06/2011  . Hypercholesterolemia 05/06/2011  . Allergy   . Anxiety   . Arthritis     finger  . Emphysema of lung ([facility])   . GERD (gastroesophageal reflux disease)   . History of radiation therapy 07/04/14, 07/06/14, 07/10/14    LLL lung nodule/54 Gy/3 fx- SBRT  . Cancer ([facility])     rt lung/dx 2011/surg only  . Lung cancer ([facility])   . Adenocarcinoma of lung ([facility]) 05/06/2011  has Back fracture; Adenocarcinoma of lung ([facility]); Hypertension; COPD (chronic obstructive pulmonary disease) ([facility]); Emphysema; Hypercholesterolemia; Primary cancer of left lower lobe of lung ([facility]); GERD (gastroesophageal reflux disease); and Nausea without vomiting on his problem list.     is allergic to fentanyl and aspirin.  Maurice Orozco does not currently have medications on file.  SURGICAL HISTORY: Past Surgical History  Procedure Laterality Date  . Back surgery    . Lung lobectomy  2012    RT UPPER LOBE  . Back surgery  2011    metal plate l-4,L5    SOCIAL HISTORY: Social History   Social History  . Marital  Status: Married    Spouse Name: N/A  . Number of Children: 3  . Years of Education: N/A   Occupational History  . Not on file.   Social History Main Topics  . Smoking status: Former Smoker    Types: Cigarettes    Quit date: 09/08/2010  . Smokeless tobacco: Current User    Types: Chew     Comment: still chews tobacco  . Alcohol Use: No  . Drug Use: No  . Sexual Activity: Not Currently   Other Topics Concern  . Not on file   Social History Narrative    FAMILY HISTORY: Family History  Problem Relation Age of Onset  . Hypertension Mother   . Kidney disease Father   . Heart attack Brother   . Cancer Maternal Uncle     prostate cancer  . Cancer Maternal Uncle     prostate cancer  . Cancer Maternal Uncle     prostate cancer  . Cancer Maternal Uncle     prostate cancer    Review of Systems  Constitutional: Positive for malaise/fatigue. Negative for fever, chills and weight loss.  HENT: Negative for congestion, hearing loss, nosebleeds, sore throat and tinnitus.   Eyes: Negative for blurred vision, double vision, pain and discharge.  Respiratory: Negative for hemoptysis, sputum production, shortness of breath and wheezing.  Positive for cough Cardiovascular: Negative for chest pain, palpitations, claudication, leg swelling and PND.  Gastrointestinal: Negative for heartburn, nausea, vomiting, abdominal pain, diarrhea, constipation, blood in stool and melena.       "puffy belly button"  Genitourinary: Negative for dysuria, urgency, frequency and hematuria.  Musculoskeletal: Negative for myalgias, joint pain and falls.  Skin: Negative for itching and rash.  Neurological: Negative for dizziness, tingling, tremors, sensory change, speech change, focal weakness, seizures, loss of consciousness, weakness and headaches.  Endo/Heme/Allergies: Does not bruise/bleed easily.  Psychiatric/Behavioral: Negative for depression, suicidal ideas, memory loss and substance abuse. The patient  is not nervous/anxious and does not have insomnia.   14 point review of systems was performed and is negative except as detailed under history of present illness and above   PHYSICAL EXAMINATION  ECOG PERFORMANCE STATUS: 1 - Symptomatic but completely ambulatory  Filed Vitals:   10/19/15 1120  BP: 144/73  Pulse: 84  Temp: 98.4 F (36.9 C)  Resp: 18    Physical Exam  Constitutional: He is oriented to person, place, and time and well-developed, well-nourished, and in no distress.  HENT:  Head: Normocephalic and atraumatic.  Nose: Nose normal.  Mouth/Throat: Oropharynx is clear and moist. No oropharyngeal exudate.  Eyes: Conjunctivae and EOM are normal. Pupils are equal, round, and reactive to light. Right eye exhibits no discharge. Left eye exhibits no discharge. No scleral icterus.  Neck: Normal range of motion. Neck supple. No tracheal deviation present. No thyromegaly present.  Cardiovascular: Normal rate, regular rhythm and normal heart sounds.  Exam reveals no gallop and no friction rub. No murmur heard. Pulmonary/Chest: Effort normal and breath sounds normal. He has no wheezes. He has no rales.  Abdominal: Soft. Bowel sounds are normal. He exhibits no distension and no mass. There is no tenderness. There is no rebound and no guarding.  Umbilical hernia noted on exam, easily reducible  Musculoskeletal: Normal range of motion. He exhibits no edema.  Lymphadenopathy:    He has no cervical adenopathy.  Neurological: He is alert and oriented to person, place, and time. He has normal reflexes. No cranial nerve deficit. Gait normal. Coordination normal.  Skin: Skin is warm and dry. No rash noted.  Psychiatric: Mood, memory, affect and judgment normal.  Nursing note and vitals reviewed.   LABORATORY DATA: I have reviewed the data as listed.  CBC    Component Value Date/Time   WBC 8.2 10/19/2015 0942   RBC 3.95* 10/19/2015 0942   HGB 13.0 10/19/2015 0942   HCT 38.8*  10/19/2015 0942   PLT 239 10/19/2015 0942   MCV 98.2 10/19/2015 0942   MCH 32.9 10/19/2015 0942   MCHC 33.5 10/19/2015 0942   RDW 12.6 10/19/2015 0942   LYMPHSABS 1.4 10/19/2015 0942   MONOABS 0.8 10/19/2015 0942   EOSABS 0.1 10/19/2015 0942   BASOSABS 0.0 10/19/2015 0942   CMP     Component Value Date/Time   NA 138 10/19/2015 0942   K 3.7 10/19/2015 0942   CL 100* 10/19/2015 0942   CO2 29 10/19/2015 0942   GLUCOSE 86 10/19/2015 0942   BUN 21* 10/19/2015 0942   BUN 23.9 07/05/2015 1239   CREATININE 0.87 10/19/2015 0942   CREATININE 0.9 07/05/2015 1239   CALCIUM 9.3 10/19/2015 0942   PROT 7.2 10/19/2015 0942   ALBUMIN 4.3 10/19/2015 0942   AST 16 10/19/2015 0942   ALT 19 10/19/2015 0942   ALKPHOS 57 10/19/2015 0942   BILITOT 0.5 10/19/2015 0942   GFRNONAA >60 10/19/2015 0942   GFRAA >60 10/19/2015 0942   RADIOLOGY:  Study Result     CLINICAL DATA: Restaging right lung cancer diagnosed in 2012 with right lower lobe resection. Subsequent left lower lobe cancer treated with radiation therapy in 2015. Subsequent encounter.  EXAM: CT CHEST WITH CONTRAST  TECHNIQUE: Multidetector CT imaging of the chest was performed during intravenous contrast administration.  CONTRAST: 79m OMNIPAQUE IOHEXOL 300 MG/ML SOLN  COMPARISON: CTs dated 03/29/2015 and 01/24/2015.  FINDINGS: Mediastinum/Nodes: There are no enlarged mediastinal, hilar or axillary lymph nodes.Small mediastinal lymph nodes are unchanged. There is a stable moderate size hiatal hernia. The heart size is normal. There is no pericardial effusion. Atherosclerosis of the aorta, great vessels and coronary arteries again noted.  Lungs/Pleura: There is no pleural effusion. There are stable postsurgical changes from previous right upper lobe resection. The patchy airspace disease noted in the right lower lobe on the most recent examination has resolved. The right lung is clear. The perifissural nodule  on the left appear slightly smaller, measuring 8 mm on image 38. There is surrounding parenchymal density attributed to radiation therapy. Medially in the left lower lobe, there is new patchy airspace disease on images 49-53, likely inflammatory. No new or enlarging pulmonary nodules identified. Diffuse emphysematous changes noted.  Upper abdomen: The visualized upper abdomen appears stable. No evidence of adrenal mass.  Musculoskeletal/Chest wall: There is no chest wall mass or suspicious osseous finding.  IMPRESSION: 1. The perifissural nodule in the  left lower lobe appears slightly smaller with surrounding parenchymal opacity attributed to interval radiation therapy. 2. In addition, there is new patchy airspace disease more inferiorly in the left lower lobe, likely inflammatory. 3. The patchy airspace disease noted peripherally in the right lower lobe on the most recent examination has resolved. 4. No evidence of metastatic disease.   Electronically Signed  By: Richardean Sale M.D.  On: 07/05/2015 13:50     CLINICAL DATA: Right lung cancer diagnosed in 2011 and left lung cancer diagnosed in 2015. History of right upper and left lower lobectomy. Radiation therapy completed 18 months ago. Subsequent encounter.  EXAM: CT CHEST WITH CONTRAST  TECHNIQUE: Multidetector CT imaging of the chest was performed during intravenous contrast administration.  CONTRAST: 40m OMNIPAQUE IOHEXOL 300 MG/ML SOLN  COMPARISON: PET-CT 01/24/2015. Chest CT 01/11/2015 and 10/12/2014. IMPRESSION: 1. Stable perifissural nodule on the left with adjacent fissural thickening. 2. New patchy airspace disease in the right lower lobe, likely inflammatory. 3. No adenopathy or pleural effusion. 4. Continued CT follow-up recommended in 3-6 months.   Electronically Signed  By: WRichardean SaleM.D.  On: 03/29/2015 14:39  ASSESSMENT and THERAPY PLAN:  History of  adenocarcinoma of RUL Stage IA adenocarcinoma of the left lower lung status post SBRT  70year old male with an early stage adenocarcinoma of the lung. He has completed SBRT with Dr. SIsidore Moos Last CT imaging performed was reviewed with the patient. Clinically he is doing fairly well. I have recommended follow-up again in 6 months per his request.  Laboratory studies were reviewed with the patient in detail.Maurice Orozco Dr. SIsidore Moosagain on March 3rd, and has a CT of the chest coming up on 10/25/2015.  Umbilical hernia  I discussed his hernia with him. I provided him with some reading information about umbilical hernias. I advised him that if he has discomfort in the abdominal area is certainly consultation with the surgeon is not unreasonable. If he has additional concerns or questions I have advised him to call.  Orders Placed This Encounter  Procedures  . CBC with Differential    Standing Status: Future     Number of Occurrences:      Standing Expiration Date: 10/18/2016  . Comprehensive metabolic panel    Standing Status: Future     Number of Occurrences:      Standing Expiration Date: 10/18/2016   All questions were answered. The patient knows to call the clinic with any problems, questions or concerns. We can certainly see the patient much sooner if necessary.   This document serves as a record of services personally performed by SAncil Linsey MD. It was created on her behalf by KToni Amend a trained medical scribe. The creation of this record is based on the scribe's personal observations and the provider's statements to them. This document has been checked and approved by the attending provider.  I have reviewed the above documentation for accuracy and completeness, and I agree with the above.  This note was electronically signed.  SKelby Fam PWhitney Muse MD

## 2015-10-25 ENCOUNTER — Encounter (HOSPITAL_COMMUNITY): Payer: Self-pay

## 2015-10-25 ENCOUNTER — Ambulatory Visit (HOSPITAL_COMMUNITY)
Admission: RE | Admit: 2015-10-25 | Discharge: 2015-10-25 | Disposition: A | Discharge status: Home / Self Care | Payer: Medicare Other | Source: Ambulatory Visit | Attending: Radiation Oncology | Admitting: Radiation Oncology

## 2015-10-25 DIAGNOSIS — C3432 Malignant neoplasm of lower lobe, left bronchus or lung: Secondary | ICD-10-CM | POA: Diagnosis not present

## 2015-10-25 DIAGNOSIS — R911 Solitary pulmonary nodule: Secondary | ICD-10-CM | POA: Diagnosis not present

## 2015-10-26 ENCOUNTER — Telehealth: Payer: Self-pay

## 2015-10-26 ENCOUNTER — Ambulatory Visit: Payer: Medicare Other | Admitting: Radiation Oncology

## 2015-10-26 NOTE — Telephone Encounter (Signed)
I called and left a voice mail with Maurice Orozco informing him that his CT scan was good, it states no new issues or concerns, and that Dr. Isidore Moos looks forward to seeing him in March for an exam. I told him to call me back with any questions or concerns.

## 2015-11-09 ENCOUNTER — Ambulatory Visit
Admission: RE | Admit: 2015-11-09 | Discharge: 2015-11-09 | Disposition: A | Discharge status: Home / Self Care | Payer: Medicare Other | Source: Ambulatory Visit | Attending: Radiation Oncology | Admitting: Radiation Oncology

## 2015-11-09 ENCOUNTER — Encounter: Payer: Self-pay | Admitting: Radiation Oncology

## 2015-11-09 VITALS — BP 127/67 | HR 82 | Temp 98.5°F | Ht 66.0 in | Wt 163.6 lb

## 2015-11-09 DIAGNOSIS — C3432 Malignant neoplasm of lower lobe, left bronchus or lung: Secondary | ICD-10-CM

## 2015-11-09 DIAGNOSIS — K429 Umbilical hernia without obstruction or gangrene: Secondary | ICD-10-CM

## 2015-11-09 NOTE — Progress Notes (Signed)
Radiation Oncology         ([phone]) [phone] ________________________________  Name: Maurice Orozco MRN: [id number]  Date: 11/09/2015  DOB: Aug 05, 1946  Follow-Up Visit Note CC: Collene Mares MD  Outpatient  CC: Leonides Grills, MD  Farrel Gobble, MD  Diagnosis and Prior Radiotherapy:    ICD-9-CM ICD-10-CM   1. Primary cancer of left lower lobe of lung ([facility]) 162.5 C34.32     Left lower lobe nodule suspicious for primary lung carcinoma, T1aN0M0 Stage IA  Indication for treatment:  curative    Radiation treatment dates:   07/04/2014, 07/06/2014, 07/10/2014  Site/dose:   Left lower lung nodule / 54 Gy in 3 fractions given every other day  Narrative:  [name] is here for follow up of radiation completed 07/10/2014 to his left lower lung. He is here to receive CT results from 10/25/15. He reports pain in his lower back from a "plate" which he rates a 2/10, which he takes 2-3 Norco every 4 hours.  He reports he is eating well and his energy level is "fair". He reports some shortness of breath if he exerts himself. He does exercise on the treadmill for 5-10 minutes at home occasionally. He reports he is using chewing tobacco daily. The patient states his hernia has been bothering him.   I reviewed his chest CT from 10/25/15, this demonstrates no evidence or recurrence of progression.   ALLERGIES:  is allergic to fentanyl and aspirin.  Meds: Current Outpatient Prescriptions  Medication Sig Dispense Refill  . albuterol (PROVENTIL HFA;VENTOLIN HFA) 108 (90 BASE) MCG/ACT inhaler Inhale 2 puffs into the lungs every 6 (six) hours as needed for wheezing or shortness of breath.     . ALPRAZolam (XANAX) 0.5 MG tablet Take 0.5 mg by mouth at bedtime as needed for sleep.     . Cimetidine (TAGAMET PO) Take 1 tablet by mouth as needed (stomach). Taking about 8 daily    . fluticasone (FLONASE) 50 MCG/ACT nasal spray Place 2 sprays into the nose as needed for allergies.     . hydrochlorothiazide 25  MG tablet Take 25 mg by mouth daily. TAKES 1/2 TABLET DAILY     . HYDROcodone-acetaminophen (NORCO) 10-325 MG per tablet Take 1 tablet by mouth every 6 (six) hours as needed. TAKES 1 - 2 EVERY 4 HOURS FOR BACK PAIN     . pantoprazole (PROTONIX) 40 MG tablet Take 40 mg by mouth daily.    . simvastatin (ZOCOR) 40 MG tablet Take 40 mg by mouth daily at 6 PM.      No current facility-administered medications for this encounter.    Physical Findings: The patient is in no acute distress. Patient is alert and oriented.  height is '5\' 6"'$  (1.676 m) and weight is 163 lb 9.6 oz (74.208 kg). His temperature is 98.5 F (36.9 C). His blood pressure is 127/67 and his pulse is 82. His oxygen saturation is 99%.   General: Alert and oriented, in no acute distress HEENT: Head is normocephalic. Extraocular movements are intact. Mucous membranes are moist. The patient has a sub-centimeter ulcer in the right alveolar ridge. He refuses to remove dentures completely Neck: No palpable supraclavicular or cervical adenopathy.  Heart: Regular in rate and rhythm  Chest: Decreased breath sound bilaterally, with no rhonchi, wheezes, or rales. Psychiatric: Judgment and insight are intact. Affect is appropriate. Abdomen: The patient has an umbilical hernia  Lab Findings: Lab Results  Component Value Date   WBC 8.2 10/19/2015  HGB 13.0 10/19/2015   HCT 38.8* 10/19/2015   MCV 98.2 10/19/2015   PLT 239 10/19/2015    Radiographic Findings: Ct Chest Wo Contrast  10/25/2015  CLINICAL DATA:  Left lung cancer diagnosed in October 2015. Radiation therapy completed. History of right lung cancer with right upper lobectomy in 2011. EXAM: CT CHEST WITHOUT CONTRAST TECHNIQUE: Multidetector CT imaging of the chest was performed following the standard protocol without IV contrast. COMPARISON:  Chest CT 07/05/2015 and 03/29/2015.  PET-CT 01/24/2015 FINDINGS: Mediastinum/Nodes: There are no enlarged mediastinal, hilar or axillary  lymph nodes.Hilar assessment is mildly limited by the lack of intravenous contrast, although the hilar contours are unchanged. A moderate size hiatal hernia is again noted. The heart size is normal. There is no pericardial effusion. There is atherosclerosis of the aorta, great vessels and coronary arteries. Lungs/Pleura: There is no pleural effusion. Stable postsurgical changes from previous right upper lobe resection. The perifissural left lower lobe nodule has not significantly changed, measuring 8 mm on image 38. Surrounding linear parenchymal scarring has mildly improved. There are no new or enlarging pulmonary nodules. The patchy left lower lobe airspace disease seen on the most recent study has resolved. There are stable emphysematous changes. Upper abdomen:  Appears stable without suspicious findings. Musculoskeletal/Chest wall: There is no chest wall mass or suspicious osseous finding. IMPRESSION: 1. Stable perifissural left lower lobe nodule with improving surrounding radiation changes. 2. Resolution of patchy airspace disease in left lower lobe. 3. No evidence of metastatic disease. Electronically Signed   By: Richardean Sale M.D.   On: 10/25/2015 12:24    Impression/Plan: NED Follow up with CT of chest in mid June  The patient has a sub-centimeter ulcer in the right alveolar ridge, he will stop using dentures and if ulcer doesn't heal within 2 weeks he will let me know so I may refer to ENT. He has risk factors for oral cancer. He will contact his dentist to get his dentures refit.   The patient continues to use tobacco. The patient was counseled to stop using tobacco but is not motivated to quit chewing  In regards to his umbilical hernia, I will make a referral to general surgery for him.   _____________________________________   Eppie Gibson, MD  This document serves as a record of services personally performed by Eppie Gibson, MD. It was created on her behalf by Derek Mound, a  trained medical scribe. The creation of this record is based on the scribe's personal observations and the provider's statements to them. This document has been checked and approved by the attending provider.

## 2015-11-09 NOTE — Progress Notes (Signed)
Maurice Orozco is here for follow up of radiation completed 07/10/2014 to his Left Lower lung. He is here to receive CT results from 10/25/15. He reports pain in his lower back from a "plate" which he rates a 2/10, which he takes 2-3 Norco every 4 hours.  He reports he is eating well and his energy level is "fair". He reports some shortness of breath if he exerts himself. He does exercise on the treadmill for 5-10 minutes at home occasionally. He reports he is using chewing tobacco daily.   BP 127/67 mmHg  Pulse 82  Temp(Src) 98.5 F (36.9 C)  Ht '5\' 6"'$  (1.676 m)  Wt 163 lb 9.6 oz (74.208 kg)  BMI 26.42 kg/m2  SpO2 99%   Wt Readings from Last 3 Encounters:  11/09/15 163 lb 9.6 oz (74.208 kg)  10/19/15 166 lb (75.297 kg)  07/06/15 162 lb 1.6 oz (73.528 kg)

## 2015-11-12 ENCOUNTER — Telehealth: Payer: Self-pay | Admitting: *Deleted

## 2015-11-12 NOTE — Telephone Encounter (Signed)
CALLED PATIENT TO INFORM OF CT ON 02-21-16 @ [facility] AND HIS FU WITH DR. Isidore Moos ON 02-22-16 TO GET HIS RESULTS AND HIS APPT. WITH SURGEON A. RAMIREZ ON 11-19-15 - ARRIVAL TIME - 2 PM, SPOKE WITH PATIENT AND HE IS AWARE OF THESE APPTS.

## 2015-11-27 ENCOUNTER — Ambulatory Visit: Payer: Self-pay | Admitting: General Surgery

## 2015-11-27 NOTE — H&P (Signed)
History of Present Illness Maurice Ok MD; 11/19/2015 2:08 PM) Patient words: NP.  The patient is a 70 year old male who presents with an umbilical hernia. Patient is a 70 year old male who is referred by Dr. Eppie Orozco for an evaluation of an umbilical hernia. Patient states that he's had the hernia there for multiple years. He states his become more bothersome recently. He states that his gotten slightly larger. The patient states it bothers him on and off. Mainly when he is constipated and having a strain for bowel movements. He states that when lifting heavy objects does have a little bit of pain.  He's had no signs or symptoms of incarceration or strangulation.  Patient has been treated for bilateral lung cancer recently.   Other Problems Maurice Ok, MD; 11/19/2015 2:10 PM) Arthritis Back Pain Chronic Obstructive Lung Disease Emphysema Of Lung Gastroesophageal Reflux Disease Hemorrhoids High blood pressure Hypercholesterolemia Lung Cancer Umbilical Hernia Repair  Past Surgical History Maurice Ok, MD; 11/19/2015 2:10 PM) Spinal Surgery - Lower Back  Diagnostic Studies History Maurice Ok, MD; 11/19/2015 2:10 PM) Colonoscopy 1-5 years ago  Allergies Maurice Orozco, RMA; 11/19/2015 1:56 PM) FentaNYL Citrate-NaCl (PF) *ANALGESICS - OPIOID* Aspirin Adult Low Strength *ANALGESICS - NonNarcotic*  Medication History Maurice Orozco, RMA; 11/19/2015 1:56 PM) Hydrocodone-Acetaminophen (10-'325MG'$  Tablet, Oral) Active. ALPRAZolam (0.'5MG'$  Tablet, Oral) Active. Fluticasone Propionate (50MCG/ACT Suspension, Nasal) Active. HydroCHLOROthiazide ('25MG'$  Tablet, Oral) Active. Pantoprazole Sodium ('40MG'$  Tablet DR, Oral) Active. Simvastatin ('40MG'$  Tablet, Oral) Active. Ventolin HFA (108 (90 Base)MCG/ACT Aerosol Soln, Inhalation) Active. Medications Reconciled  Social History Maurice Ok, MD; 11/19/2015 2:10 PM) Alcohol use Caffeine use No drug  use Tobacco use  Family History Maurice Ok, MD; 11/19/2015 2:10 PM) Arthritis Colon Polyps Hypertension Kidney Disease    Review of Systems Maurice Ok MD; 11/19/2015 2:10 PM) General Not Present- Appetite Loss, Chills, Fatigue, Fever, Night Sweats, Weight Gain and Weight Loss. Skin Not Present- Change in Wart/Mole, Dryness, Hives, Jaundice, New Lesions, Non-Healing Wounds, Rash and Ulcer. HEENT Present- Seasonal Allergies and Sinus Pain. Not Present- Earache, Hearing Loss, Hoarseness, Nose Bleed, Oral Ulcers, Ringing in the Ears, Sore Throat, Visual Disturbances, Wears glasses/contact lenses and Yellow Eyes. Respiratory Not Present- Bloody sputum, Chronic Cough, Difficulty Breathing, Snoring and Wheezing. Breast Not Present- Breast Mass, Breast Pain, Nipple Discharge and Skin Changes. Cardiovascular Not Present- Chest Pain, Difficulty Breathing Lying Down, Leg Cramps, Palpitations, Rapid Heart Rate, Shortness of Breath and Swelling of Extremities. Gastrointestinal Present- Abdominal Pain and Indigestion. Not Present- Bloating, Bloody Stool, Change in Bowel Habits, Chronic diarrhea, Constipation, Difficulty Swallowing, Excessive gas, Gets full quickly at meals, Hemorrhoids, Nausea, Rectal Pain and Vomiting. Male Genitourinary Not Present- Blood in Urine, Change in Urinary Stream, Frequency, Impotence, Nocturia, Painful Urination, Urgency and Urine Leakage. Musculoskeletal Present- Back Pain. Not Present- Joint Pain, Joint Stiffness, Muscle Pain, Muscle Weakness and Swelling of Extremities. Neurological Present- Trouble walking. Not Present- Decreased Memory, Fainting, Headaches, Numbness, Seizures, Tingling, Tremor and Weakness. Psychiatric Not Present- Anxiety, Bipolar, Change in Sleep Pattern, Depression, Fearful and Frequent crying. Endocrine Not Present- Cold Intolerance, Excessive Hunger, Hair Changes, Heat Intolerance, Hot flashes and New Diabetes. Hematology Present-  Easy Bruising. Not Present- Excessive bleeding, Gland problems, HIV and Persistent Infections.  Vitals (Maurice Orozco RMA; 11/19/2015 1:57 PM) 11/19/2015 1:57 PM Weight: 164 lb Height: 67in Body Surface Area: 1.86 m Body Mass Index: 25.69 kg/m  Temp.: 97.60F  Pulse: 71 (Regular)  BP: 162/82 (Sitting, Left Arm, Standard)       Physical Exam Maurice Ok  MD; 11/19/2015 2:09 PM) General Mental Status-Alert. General Appearance-Consistent with stated age. Hydration-Well hydrated. Voice-Normal.  Head and Neck Head-normocephalic, atraumatic with no lesions or palpable masses. Trachea-midline. Thyroid Gland Characteristics - normal size and consistency.  Chest and Lung Exam Chest and lung exam reveals -quiet, even and easy respiratory effort with no use of accessory muscles and on auscultation, normal breath sounds, no adventitious sounds and normal vocal resonance. Inspection Chest Wall - Normal. Back - normal.  Cardiovascular Cardiovascular examination reveals -normal heart sounds, regular rate and rhythm with no murmurs and normal pedal pulses bilaterally.  Abdomen Inspection Skin - Scar - no surgical scars. Hernias - Umbilical hernia - Reducible(small 0.5 cm umbilical hernia, reducible). Palpation/Percussion Normal exam - Soft, Non Tender, No Rebound tenderness, No Rigidity (guarding) and No hepatosplenomegaly. Auscultation Normal exam - Bowel sounds normal.    Assessment & Plan Maurice Ok MD; 7/36/6815 9:47 PM) UMBILICAL HERNIA WITHOUT OBSTRUCTION AND WITHOUT GANGRENE (K42.9) Impression: 70 year old male with an umbilical hernia.  1. The patient will like to proceed to the operating for a lap umbilical hernia repair with mesh. 2. All risks and benefits were discussed with the patient, to generally include infection, bleeding, damage to surrounding structures, acute and chronic nerve pain, and recurrence. Alternatives were offered and  described.  All questions were answered and the patient voiced understanding of the procedure and wishes to proceed at this point.

## 2015-12-10 DIAGNOSIS — K429 Umbilical hernia without obstruction or gangrene: Secondary | ICD-10-CM | POA: Diagnosis not present

## 2016-01-02 DIAGNOSIS — G894 Chronic pain syndrome: Secondary | ICD-10-CM | POA: Diagnosis not present

## 2016-01-02 DIAGNOSIS — J019 Acute sinusitis, unspecified: Secondary | ICD-10-CM | POA: Diagnosis not present

## 2016-01-02 DIAGNOSIS — Z1389 Encounter for screening for other disorder: Secondary | ICD-10-CM | POA: Diagnosis not present

## 2016-01-02 DIAGNOSIS — Z6824 Body mass index (BMI) 24.0-24.9, adult: Secondary | ICD-10-CM | POA: Diagnosis not present

## 2016-02-15 ENCOUNTER — Encounter (HOSPITAL_COMMUNITY): Payer: Medicare Other | Attending: Hematology & Oncology | Admitting: Hematology & Oncology

## 2016-02-15 ENCOUNTER — Encounter (HOSPITAL_COMMUNITY): Payer: Medicare Other

## 2016-02-15 VITALS — BP 119/63 | HR 72 | Temp 98.5°F | Resp 20 | Wt 159.2 lb

## 2016-02-15 DIAGNOSIS — Z923 Personal history of irradiation: Secondary | ICD-10-CM | POA: Diagnosis not present

## 2016-02-15 DIAGNOSIS — Z85118 Personal history of other malignant neoplasm of bronchus and lung: Secondary | ICD-10-CM | POA: Diagnosis not present

## 2016-02-15 DIAGNOSIS — C3490 Malignant neoplasm of unspecified part of unspecified bronchus or lung: Secondary | ICD-10-CM

## 2016-02-15 DIAGNOSIS — M1990 Unspecified osteoarthritis, unspecified site: Secondary | ICD-10-CM | POA: Insufficient documentation

## 2016-02-15 DIAGNOSIS — Z87891 Personal history of nicotine dependence: Secondary | ICD-10-CM | POA: Insufficient documentation

## 2016-02-15 DIAGNOSIS — F419 Anxiety disorder, unspecified: Secondary | ICD-10-CM | POA: Diagnosis not present

## 2016-02-15 DIAGNOSIS — Z720 Tobacco use: Secondary | ICD-10-CM | POA: Diagnosis not present

## 2016-02-15 DIAGNOSIS — C3492 Malignant neoplasm of unspecified part of left bronchus or lung: Secondary | ICD-10-CM

## 2016-02-15 DIAGNOSIS — J449 Chronic obstructive pulmonary disease, unspecified: Secondary | ICD-10-CM | POA: Insufficient documentation

## 2016-02-15 DIAGNOSIS — I10 Essential (primary) hypertension: Secondary | ICD-10-CM | POA: Diagnosis not present

## 2016-02-15 DIAGNOSIS — E7800 Pure hypercholesterolemia, unspecified: Secondary | ICD-10-CM | POA: Diagnosis not present

## 2016-02-15 DIAGNOSIS — K219 Gastro-esophageal reflux disease without esophagitis: Secondary | ICD-10-CM | POA: Diagnosis not present

## 2016-02-15 LAB — COMPREHENSIVE METABOLIC PANEL
ALBUMIN: 4 g/dL (ref 3.5–5.0)
ALT: 26 U/L (ref 17–63)
ANION GAP: 8 (ref 5–15)
AST: 17 U/L (ref 15–41)
Alkaline Phosphatase: 60 U/L (ref 38–126)
BUN: 16 mg/dL (ref 6–20)
CHLORIDE: 98 mmol/L — AB (ref 101–111)
CO2: 28 mmol/L (ref 22–32)
Calcium: 9 mg/dL (ref 8.9–10.3)
Creatinine, Ser: 0.79 mg/dL (ref 0.61–1.24)
GFR calc non Af Amer: >60 mL/min (ref >60)
GLUCOSE: 109 mg/dL — AB (ref 65–99)
POTASSIUM: 3.5 mmol/L (ref 3.5–5.1)
SODIUM: 134 mmol/L — AB (ref 135–145)
Total Bilirubin: 0.5 mg/dL (ref 0.3–1.2)
Total Protein: 7.5 g/dL (ref 6.5–8.1)

## 2016-02-15 LAB — CBC WITH DIFFERENTIAL/PLATELET
BASOS PCT: 0 %
Basophils Absolute: 0 10*3/uL (ref 0.0–0.1)
EOS ABS: 0.1 10*3/uL (ref 0.0–0.7)
EOS PCT: 1 %
HCT: 37.3 % — ABNORMAL LOW (ref 39.0–52.0)
HEMOGLOBIN: 12.7 g/dL — AB (ref 13.0–17.0)
Lymphocytes Relative: 17 %
Lymphs Abs: 1.8 10*3/uL (ref 0.7–4.0)
MCH: 33.5 pg (ref 26.0–34.0)
MCHC: 34 g/dL (ref 30.0–36.0)
MCV: 98.4 fL (ref 78.0–100.0)
MONOS PCT: 10 %
Monocytes Absolute: 1.1 10*3/uL — ABNORMAL HIGH (ref 0.1–1.0)
NEUTROS PCT: 72 %
Neutro Abs: 7.9 10*3/uL — ABNORMAL HIGH (ref 1.7–7.7)
PLATELETS: 238 10*3/uL (ref 150–400)
RBC: 3.79 MIL/uL — ABNORMAL LOW (ref 4.22–5.81)
RDW: 12.8 % (ref 11.5–15.5)
WBC: 10.9 10*3/uL — ABNORMAL HIGH (ref 4.0–10.5)

## 2016-02-15 NOTE — Patient Instructions (Signed)
[place] at [facility] Discharge Instructions  RECOMMENDATIONS MADE BY THE CONSULTANT AND ANY TEST RESULTS WILL BE SENT TO YOUR REFERRING PHYSICIAN.  Return to clinic in 1 year with labs  Thank you for choosing [place] at [facility] to provide your oncology and hematology care.  To afford each patient quality time with our provider, please arrive at least 15 minutes before your scheduled appointment time.   Beginning January 23rd 2017 lab work for the [facility] will be done in the  Main lab at [facility] on 1st floor. If you have a lab appointment with the [facility] please come in thru the  Main Entrance and check in at the main information desk  You need to re-schedule your appointment should you arrive 10 or more minutes late.  We strive to give you quality time with our providers, and arriving late affects you and other patients whose appointments are after yours.  Also, if you no show three or more times for appointments you may be dismissed from the clinic at the providers discretion.     Again, thank you for choosing [facility].  Our hope is that these requests will decrease the amount of time that you wait before being seen by our physicians.       _____________________________________________________________  Should you have questions after your visit to [facility], please contact our office at [phone] between the hours of 8:30 a.m. and 4:30 p.m.  Voicemails left after 4:30 p.m. will not be returned until the following business day.  For prescription refill requests, have your pharmacy contact our office.         Resources For Cancer Patients and their Caregivers ? American Cancer Society: Can assist with transportation, wigs, general needs, runs Look Good Feel Better.        773-133-3022 ? Cancer Care: Provides financial assistance, online support groups, medication/co-pay  assistance.  1-800-813-HOPE 740-840-1313) ? [place] Assists [place] cancer patients and their families through emotional , educational and financial support.  (732) 035-9505 ? [place] DSS Where to apply for food stamps, Medicaid and utility assistance. 559-165-8463 ? [facility]: Transportation to medical appointments. (404)099-8456 ? Social Security Administration: May apply for disability if have a Stage IV cancer. (251) 435-7092 (870)884-8214 ? [employer], Disability and Transit Services: Assists with nutrition, care and transit needs. [place] Support Programs: '@10RELATIVEDAYS'$ @ > Cancer Support Group  2nd Tuesday of the month 1pm-2pm, Journey Room  > Creative Journey  3rd Tuesday of the month 1130am-1pm, Journey Room  > Look Good Feel Better  1st Wednesday of the month 10am-12 noon, Journey Room (Call [place] to register [phone])
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
## 2016-02-19 ENCOUNTER — Encounter (HOSPITAL_COMMUNITY): Payer: Self-pay | Admitting: Hematology & Oncology

## 2016-02-20 ENCOUNTER — Encounter: Payer: Self-pay | Admitting: Radiation Oncology

## 2016-02-21 ENCOUNTER — Ambulatory Visit (HOSPITAL_COMMUNITY)
Admission: RE | Admit: 2016-02-21 | Discharge: 2016-02-21 | Disposition: A | Discharge status: Home / Self Care | Payer: Medicare Other | Source: Ambulatory Visit | Attending: Radiation Oncology | Admitting: Radiation Oncology

## 2016-02-21 DIAGNOSIS — C3432 Malignant neoplasm of lower lobe, left bronchus or lung: Secondary | ICD-10-CM | POA: Diagnosis not present

## 2016-02-21 DIAGNOSIS — K449 Diaphragmatic hernia without obstruction or gangrene: Secondary | ICD-10-CM | POA: Diagnosis not present

## 2016-02-21 DIAGNOSIS — I2510 Atherosclerotic heart disease of native coronary artery without angina pectoris: Secondary | ICD-10-CM | POA: Diagnosis not present

## 2016-02-21 DIAGNOSIS — R911 Solitary pulmonary nodule: Secondary | ICD-10-CM | POA: Insufficient documentation

## 2016-02-21 DIAGNOSIS — I7090 Unspecified atherosclerosis: Secondary | ICD-10-CM | POA: Insufficient documentation

## 2016-02-22 ENCOUNTER — Encounter: Payer: Self-pay | Admitting: Radiation Oncology

## 2016-02-22 ENCOUNTER — Ambulatory Visit
Admission: RE | Admit: 2016-02-22 | Discharge: 2016-02-22 | Disposition: A | Discharge status: Home / Self Care | Payer: Medicare Other | Source: Ambulatory Visit | Attending: Radiation Oncology | Admitting: Radiation Oncology

## 2016-02-22 VITALS — BP 152/70 | HR 75 | Temp 98.2°F | Ht 66.0 in | Wt 160.4 lb

## 2016-02-22 DIAGNOSIS — C3432 Malignant neoplasm of lower lobe, left bronchus or lung: Secondary | ICD-10-CM | POA: Insufficient documentation

## 2016-02-22 MED ORDER — LEVOFLOXACIN 500 MG PO TABS: 500.0000 mg | ORAL_TABLET | Freq: Every day | ORAL | Status: DC

## 2016-02-22 NOTE — Progress Notes (Signed)
Radiation Oncology         [phone] ________________________________  Name: Maurice Orozco MRN: [id number]  Date: 02/22/2016  DOB: 1946-02-15  Follow-Up Visit Note CC: Collene Mares MD  Outpatient  CC: Leonides Grills, MD  Farrel Gobble, MD  Diagnosis and Prior Radiotherapy:    ICD-9-CM ICD-10-CM   1. Primary cancer of left lower lobe of lung (HCC) 162.5 C34.32 levofloxacin (LEVAQUIN) 500 MG tablet    Left lower lobe nodule suspicious for primary lung carcinoma, [id number] Stage IA  Indication for treatment:  Curative    Radiation treatment dates:   07/04/2014, 07/06/2014, 07/10/2014  Site/dose:   Left lower lung nodule / 54 Gy in 3 fractions given every other day  Narrative: Patient presents to review CT scan results from 02/21/16. The report indicates a similar subpleural left lower lobe pulmonary nodule. A similar to minimal increase in surrounding radiation change. No evidence of metastatic disease within the chest. There are new right lower lobe reticular nodular opacities are likely infectious.   He rates his pain a 3/10 in his lower back, this not new and been an issue since 2011. He takes 10/'325mg'$  every 4 hours for this pain. He denies cough, or shortness of breath. He reports a good appetite. Getting over a sinus infection - didn't complete his Rx for antibiotics  ALLERGIES:  is allergic to fentanyl and aspirin.  Meds: Current Outpatient Prescriptions  Medication Sig Dispense Refill  . Cimetidine (TAGAMET PO) Take 1 tablet by mouth as needed (stomach). Taking about 8 daily    . fluticasone (FLONASE) 50 MCG/ACT nasal spray Place 2 sprays into the nose as needed for allergies.     . hydrochlorothiazide 25 MG tablet Take 25 mg by mouth daily. TAKES 1/2 TABLET DAILY     . HYDROcodone-acetaminophen (NORCO) 10-325 MG per tablet Take 1 tablet by mouth every 6 (six) hours as needed. TAKES 1 - 2 EVERY 4 HOURS FOR BACK PAIN     . pantoprazole (PROTONIX) 40 MG tablet Take 40  mg by mouth daily.    . simvastatin (ZOCOR) 40 MG tablet Take 40 mg by mouth daily at 6 PM.     . albuterol (PROVENTIL HFA;VENTOLIN HFA) 108 (90 BASE) MCG/ACT inhaler Inhale 2 puffs into the lungs every 6 (six) hours as needed for wheezing or shortness of breath.     . ALPRAZolam (XANAX) 0.5 MG tablet Take 0.5 mg by mouth at bedtime as needed for sleep.     Marland Kitchen levofloxacin (LEVAQUIN) 500 MG tablet Take 1 tablet (500 mg total) by mouth daily. 7 tablet 0   No current facility-administered medications for this encounter.    Physical Findings: The patient is in no acute distress. Patient is alert and oriented.  height is '5\' 6"'$  (1.676 m) and weight is 160 lb 6.4 oz (72.757 kg). His temperature is 98.2 F (36.8 C). His blood pressure is 152/70 and his pulse is 75. His oxygen saturation is 97%.   General: Alert and oriented, in no acute distress HEENT: Head is normocephalic. Extraocular movements are intact. Mucous membranes are moist. Oral cavity clear. Lower gingival area where he had sore at last visit has cleared. Neck: No palpable supraclavicular or cervical adenopathy.  Heart: Regular in rate and rhythm  Chest: Clear to auscultation bilaterally with no rhonchi, wheezes, or rales. Psychiatric: Judgment and insight are intact. Affect is appropriate.  Lab Findings: Lab Results  Component Value Date   WBC 10.9* 02/15/2016   HGB 12.7*  02/15/2016   HCT 37.3* 02/15/2016   MCV 98.4 02/15/2016   PLT 238 02/15/2016    Radiographic Findings: Ct Chest Wo Contrast  02/21/2016  CLINICAL DATA:  Restaging lung cancer. Right-sided diagnosed in 2011 and left-sided diagnosed 2015. Right-sided lobectomy. Radiation therapy complete 3/17. Prior hernia repair. EXAM: CT CHEST WITHOUT CONTRAST TECHNIQUE: Multidetector CT imaging of the chest was performed following the standard protocol without IV contrast. COMPARISON:  10/25/2015 FINDINGS: Mediastinum/Nodes: No supraclavicular adenopathy. Aortic and branch  vessel atherosclerosis. Normal heart size, without pericardial effusion. Multivessel coronary artery atherosclerosis. No mediastinal or definite hilar adenopathy, given limitations of unenhanced CT. Small hiatal hernia. Lungs/Pleura: Minimal right-sided pleural thickening is not significantly changed. Secretions in the non dependent trachea. Status post right upper lobectomy. Mild centrilobular emphysema. Right lower lobe peribronchovascular reticular nodular opacities are new since the prior. Posterior left upper lobe scarring is unchanged. Anterior left lower lobe subpleural pulmonary nodule measures maximally 8 mm on image 96/ series 5. Similar. Similar to minimal increase in surrounding radiation change. Upper abdomen: Normal imaged portions of the liver, spleen, gallbladder, pancreas, adrenal glands. There may be a tiny interpolar left renal hyper attenuating lesion at 6 mm on image 176/ series 2. Most likely a complex cyst, but incompletely imaged. This is likely at the superior most aspect of a fluid density lesion on 06/02/2014, also favoring a minimally complex cyst. Mild renal cortical thinning involves the right kidney. Musculoskeletal: No acute osseous abnormality. IMPRESSION: 1. Similar subpleural left lower lobe pulmonary nodule. Similar to minimal increase in surrounding radiation change. 2. No evidence of metastatic disease within the chest. 3. New right lower lobe reticular nodular opacities are likely infectious. 4. Small hiatal hernia. 5.  Atherosclerosis, including within the coronary arteries. Electronically Signed   By: Abigail Miyamoto M.D.   On: 02/21/2016 09:34   Impression/Plan: Doing well.  On review of his most recent CT, there is no evidence for lung cancer recurrence. I will prescribe him an antibiotic for the right lower lobe opacity that is concerning for infection. This might also help a lingering sinusitis that he complains of.  Follow up with survivorship in 6 months with repeat  CT scan. I will see him back thereafter; i.e. 6 months to follow, per discretion of Mike Craze NP.  _____________________________________   Eppie Gibson, MD  This document serves as a record of services personally performed by Eppie Gibson, MD. It was created on her behalf by Derek Mound, a trained medical scribe. The creation of this record is based on the scribe's personal observations and the provider's statements to them. This document has been checked and approved by the attending provider.

## 2016-02-22 NOTE — Progress Notes (Addendum)
Maurice Orozco presents for follow up of radiation completed 07/10/2014 to his Left Lower Lung. He rates his pain a 3/10 in his Lower back. He takes 10/'325mg'$  every 4 hours for this pain. He denies cough, or shortness of breath. He reports a good appetite. He had a CT scan yesterday, and is here for the results.   BP 152/70 mmHg  Pulse 75  Temp(Src) 98.2 F (36.8 C)  Ht '5\' 6"'$  (1.676 m)  Wt 160 lb 6.4 oz (72.757 kg)  BMI 25.90 kg/m2  SpO2 97%   Wt Readings from Last 3 Encounters:  02/22/16 160 lb 6.4 oz (72.757 kg)  02/15/16 159 lb 3.2 oz (72.213 kg)  11/09/15 163 lb 9.6 oz (74.208 kg)

## 2016-02-25 ENCOUNTER — Telehealth: Payer: Self-pay | Admitting: *Deleted

## 2016-02-25 NOTE — Telephone Encounter (Signed)
CALLED PATIENT TO INFORM OF CT AND SURVIVORSHP  APPT. ON 08-28-16 AND HIS FU VISIT WITH DR. Isidore Moos ON 08-29-16 TO GET HIS RESULTS FROM HIS CT ON 08-28-16, LVM FOR A RETURN CALL

## 2016-03-21 DIAGNOSIS — F419 Anxiety disorder, unspecified: Secondary | ICD-10-CM | POA: Diagnosis not present

## 2016-03-21 DIAGNOSIS — J449 Chronic obstructive pulmonary disease, unspecified: Secondary | ICD-10-CM | POA: Diagnosis not present

## 2016-03-21 DIAGNOSIS — J209 Acute bronchitis, unspecified: Secondary | ICD-10-CM | POA: Diagnosis not present

## 2016-03-21 DIAGNOSIS — Z6825 Body mass index (BMI) 25.0-25.9, adult: Secondary | ICD-10-CM | POA: Diagnosis not present

## 2016-04-11 DIAGNOSIS — G4700 Insomnia, unspecified: Secondary | ICD-10-CM | POA: Diagnosis not present

## 2016-04-11 DIAGNOSIS — Z79891 Long term (current) use of opiate analgesic: Secondary | ICD-10-CM | POA: Diagnosis not present

## 2016-04-11 DIAGNOSIS — Z1389 Encounter for screening for other disorder: Secondary | ICD-10-CM | POA: Diagnosis not present

## 2016-04-11 DIAGNOSIS — M545 Low back pain: Secondary | ICD-10-CM | POA: Diagnosis not present

## 2016-04-11 DIAGNOSIS — Z6825 Body mass index (BMI) 25.0-25.9, adult: Secondary | ICD-10-CM | POA: Diagnosis not present

## 2016-04-11 DIAGNOSIS — G894 Chronic pain syndrome: Secondary | ICD-10-CM | POA: Diagnosis not present

## 2016-04-17 ENCOUNTER — Ambulatory Visit (HOSPITAL_COMMUNITY): Payer: Medicare Other | Admitting: Hematology & Oncology

## 2016-04-17 ENCOUNTER — Other Ambulatory Visit (HOSPITAL_COMMUNITY): Payer: Medicare Other

## 2016-05-09 DIAGNOSIS — E782 Mixed hyperlipidemia: Secondary | ICD-10-CM | POA: Diagnosis not present

## 2016-05-09 DIAGNOSIS — Z1389 Encounter for screening for other disorder: Secondary | ICD-10-CM | POA: Diagnosis not present

## 2016-05-09 DIAGNOSIS — Z6824 Body mass index (BMI) 24.0-24.9, adult: Secondary | ICD-10-CM | POA: Diagnosis not present

## 2016-05-09 DIAGNOSIS — G894 Chronic pain syndrome: Secondary | ICD-10-CM | POA: Diagnosis not present

## 2016-05-09 DIAGNOSIS — F411 Generalized anxiety disorder: Secondary | ICD-10-CM | POA: Diagnosis not present

## 2016-06-06 DIAGNOSIS — I10 Essential (primary) hypertension: Secondary | ICD-10-CM | POA: Diagnosis not present

## 2016-06-06 DIAGNOSIS — Z6825 Body mass index (BMI) 25.0-25.9, adult: Secondary | ICD-10-CM | POA: Diagnosis not present

## 2016-06-06 DIAGNOSIS — G894 Chronic pain syndrome: Secondary | ICD-10-CM | POA: Diagnosis not present

## 2016-06-27 DIAGNOSIS — E663 Overweight: Secondary | ICD-10-CM | POA: Diagnosis not present

## 2016-06-27 DIAGNOSIS — Z0001 Encounter for general adult medical examination with abnormal findings: Secondary | ICD-10-CM | POA: Diagnosis not present

## 2016-06-27 DIAGNOSIS — G894 Chronic pain syndrome: Secondary | ICD-10-CM | POA: Diagnosis not present

## 2016-06-27 DIAGNOSIS — I10 Essential (primary) hypertension: Secondary | ICD-10-CM | POA: Diagnosis not present

## 2016-06-27 DIAGNOSIS — Z6825 Body mass index (BMI) 25.0-25.9, adult: Secondary | ICD-10-CM | POA: Diagnosis not present

## 2016-06-27 DIAGNOSIS — F419 Anxiety disorder, unspecified: Secondary | ICD-10-CM | POA: Diagnosis not present

## 2016-06-27 DIAGNOSIS — K219 Gastro-esophageal reflux disease without esophagitis: Secondary | ICD-10-CM | POA: Diagnosis not present

## 2016-06-27 DIAGNOSIS — T50905A Adverse effect of unspecified drugs, medicaments and biological substances, initial encounter: Secondary | ICD-10-CM | POA: Diagnosis not present

## 2016-07-11 ENCOUNTER — Telehealth (HOSPITAL_COMMUNITY): Payer: Self-pay | Admitting: *Deleted

## 2016-07-11 DIAGNOSIS — J441 Chronic obstructive pulmonary disease with (acute) exacerbation: Secondary | ICD-10-CM | POA: Diagnosis not present

## 2016-07-11 DIAGNOSIS — R0602 Shortness of breath: Secondary | ICD-10-CM | POA: Diagnosis not present

## 2016-07-11 DIAGNOSIS — R05 Cough: Secondary | ICD-10-CM | POA: Diagnosis not present

## 2016-07-11 NOTE — Telephone Encounter (Signed)
Significant other called in stating that pt has had a cough, congestion and wheezing for about a week and wanted the pt to be seen or something called in for the pt. I spoke with Kirby Crigler PA-C and he advised that the pt would need to see his PCP if they wanted him to be seen, or he could send in an antibiotic for the pt but if things aren't better within a week go to his PCP. I advised that if the pt was having all of those symptoms it would probably be best if he got in to see someone.  She verbalized understanding and stated that she would take him to urgent care due to the wait at pt's PCP.

## 2016-07-15 ENCOUNTER — Telehealth: Payer: Self-pay

## 2016-07-15 NOTE — Telephone Encounter (Signed)
I called and spoke to Ms. Maurice Orozco regarding her husband symptoms of a cough and low oxygen level. He had recently seen a doctor at an urgent care center and they recommended he follow up with Dr. Isidore Orozco to have him evaluated. [name] knows he needs to schedule an appointment with his PCP for evaluation also, as they have more experience with his type of symptoms. I offered to move up his appointment and CT scan with Dr. Isidore Orozco scheduled for 08/28/16 & 08/29/16, and they declined at this time. They reported to me that his cough has improved and his oxygen level has also improved. I encouraged them to call if any new concerns came up before his scheduled follow up with Dr. Isidore Orozco, and they voiced their understanding.

## 2016-08-20 ENCOUNTER — Telehealth: Payer: Self-pay | Admitting: Adult Health

## 2016-08-20 NOTE — Telephone Encounter (Signed)
I attempted to reach Mr. Brander to let him know that we are going to cancel his 08/28/16 appt with me in survivorship, since he is already scheduled to see Dr. Isidore Moos on 08/29/16 to get the results of his CT scan.   I left a detailed message with his appointments on his voicemail and encouraged him to call me directly with any questions or concerns.  Survivorship visit on 08/28/16 with me has been cancelled.   Mike Craze, NP [place] [phone]

## 2016-08-28 ENCOUNTER — Encounter: Payer: Medicare Other | Admitting: Adult Health

## 2016-08-28 ENCOUNTER — Ambulatory Visit (HOSPITAL_COMMUNITY)
Admission: RE | Admit: 2016-08-28 | Discharge: 2016-08-28 | Disposition: A | Discharge status: Home / Self Care | Payer: Medicare Other | Source: Ambulatory Visit | Attending: Radiation Oncology | Admitting: Radiation Oncology

## 2016-08-28 DIAGNOSIS — C3432 Malignant neoplasm of lower lobe, left bronchus or lung: Secondary | ICD-10-CM | POA: Diagnosis not present

## 2016-08-28 DIAGNOSIS — R918 Other nonspecific abnormal finding of lung field: Secondary | ICD-10-CM | POA: Insufficient documentation

## 2016-08-29 ENCOUNTER — Encounter: Payer: Self-pay | Admitting: Radiation Oncology

## 2016-08-29 ENCOUNTER — Ambulatory Visit
Admission: RE | Admit: 2016-08-29 | Discharge: 2016-08-29 | Disposition: A | Discharge status: Home / Self Care | Payer: Medicare Other | Source: Ambulatory Visit | Attending: Radiation Oncology | Admitting: Radiation Oncology

## 2016-08-29 VITALS — BP 131/68 | HR 83 | Temp 98.1°F | Ht 66.0 in | Wt 165.6 lb

## 2016-08-29 DIAGNOSIS — M545 Low back pain: Secondary | ICD-10-CM | POA: Insufficient documentation

## 2016-08-29 DIAGNOSIS — Z87891 Personal history of nicotine dependence: Secondary | ICD-10-CM | POA: Diagnosis not present

## 2016-08-29 DIAGNOSIS — M5144 Schmorl's nodes, thoracic region: Secondary | ICD-10-CM | POA: Insufficient documentation

## 2016-08-29 DIAGNOSIS — G8929 Other chronic pain: Secondary | ICD-10-CM | POA: Diagnosis not present

## 2016-08-29 DIAGNOSIS — C3432 Malignant neoplasm of lower lobe, left bronchus or lung: Secondary | ICD-10-CM | POA: Insufficient documentation

## 2016-08-29 DIAGNOSIS — Y842 Radiological procedure and radiotherapy as the cause of abnormal reaction of the patient, or of later complication, without mention of misadventure at the time of the procedure: Secondary | ICD-10-CM | POA: Insufficient documentation

## 2016-08-29 DIAGNOSIS — Z923 Personal history of irradiation: Secondary | ICD-10-CM | POA: Diagnosis not present

## 2016-08-29 DIAGNOSIS — Z08 Encounter for follow-up examination after completed treatment for malignant neoplasm: Secondary | ICD-10-CM | POA: Diagnosis not present

## 2016-08-29 NOTE — Progress Notes (Signed)
Maurice Orozco presents for follow up of radiation completed 07/10/2014 to his Left Lower Lung. He denies shortness of breath. He reports a dry cough that started recently. He denies pain at this time. He does report taking 8 Norco daily for chronic back pain. He is here to have his CT of his Chest results from yesterday. He is eating well.   BP 131/68   Pulse 83   Temp 98.1 F (36.7 C)   Ht '5\' 6"'$  (1.676 m)   Wt 165 lb 9.6 oz (75.1 kg)   SpO2 95% Comment: room air  BMI 26.73 kg/m    Wt Readings from Last 3 Encounters:  08/29/16 165 lb 9.6 oz (75.1 kg)  02/22/16 160 lb 6.4 oz (72.8 kg)  02/15/16 159 lb 3.2 oz (72.2 kg)

## 2016-08-29 NOTE — Addendum Note (Signed)
Encounter addended by: Ernst Spell, RN on: 08/29/2016  2:53 PM<BR>    Actions taken: Charge Capture section accepted

## 2016-08-29 NOTE — Progress Notes (Signed)
Radiation Oncology         [phone] ________________________________  Name: DWIGHT ADAMCZAK MRN: [id number]  Date: 08/29/2016  DOB: Jan 10, 1946  Follow-Up Visit Note CC: Collene Mares MD  Outpatient  CC: Leonides Grills, MD  Farrel Gobble, MD  Diagnosis and Prior Radiotherapy:    ICD-9-CM ICD-10-CM   1. Primary cancer of left lower lobe of lung ([place]) 162.5 C34.32     Left lower lobe nodule suspicious for primary lung carcinoma, T1aN0M0 Stage IA  Indication for treatment:  Curative    Radiation treatment dates:   07/04/2014, 07/06/2014, 07/10/2014  Site/dose:   Left lower lung nodule / 54 Gy in 3 fractions given every other day  CHIEF COMPLAINT: Here for follow-up and surveillance of left lower lung cancer  Narrative: Patient presents for follow up of radiation completed 07/10/2014 to his left lower lung and to review CT scan results from 08/28/16. He denies shortness of breath. He reports a dry cough that started recently. He denies pain at this time. He does report taking 8 Norco daily for chronic back pain. He is eating well. He thought he had pneumonia last month and [facility] where he was given antibiotics and breathing treatment which resolved his symptoms quickly. He no longer smokes or chews tobacco. He stopped after he started having an upset stomach whenever he'd chew. The patient sees a primary care physician, but the patient would prefer that someone in the cancer center check his cholesterol.   ALLERGIES:  is allergic to fentanyl; gabapentin; and aspirin.  Meds: Current Outpatient Prescriptions  Medication Sig Dispense Refill  . albuterol (PROVENTIL HFA;VENTOLIN HFA) 108 (90 BASE) MCG/ACT inhaler Inhale 2 puffs into the lungs every 6 (six) hours as needed for wheezing or shortness of breath.     . ALPRAZolam (XANAX) 0.5 MG tablet Take 0.5 mg by mouth at bedtime as needed for sleep.     . Cimetidine (TAGAMET PO) Take 1 tablet by mouth as needed  (stomach). Taking about 8 daily    . fluticasone (FLONASE) 50 MCG/ACT nasal spray Place 2 sprays into the nose as needed for allergies.     . hydrochlorothiazide 25 MG tablet Take 25 mg by mouth daily. TAKES 1/2 TABLET DAILY     . HYDROcodone-acetaminophen (NORCO) 10-325 MG per tablet Take 1 tablet by mouth every 6 (six) hours as needed. TAKES 1 - 2 EVERY 4 HOURS FOR BACK PAIN     . pantoprazole (PROTONIX) 40 MG tablet Take 40 mg by mouth daily.    . simvastatin (ZOCOR) 40 MG tablet Take 40 mg by mouth daily at 6 PM.      No current facility-administered medications for this encounter.     Physical Findings:  height is '5\' 6"'$  (1.676 m) and weight is 165 lb 9.6 oz (75.1 kg). His temperature is 98.1 F (36.7 C). His blood pressure is 131/68 and his pulse is 83. His oxygen saturation is 95%.   General: Alert and oriented, in no acute distress HEENT: Head is normocephalic. Extraocular movements are intact. Mucous membranes are moist. Oral cavity clear. No thrush noted. He declines taking his dentures out.  Neck: (+) Subcutaneous lesion that is mobile, about 1 cm in size, in the posterior left neck. Patient has noted this area for about 1.5 years which is stable in size. No other palpable supraclavicular or cervical adenopathy.  Heart: Regular in rate and rhythm, no murmurs. Chest: Clear to auscultation bilaterally with no rhonchi,  wheezes, or rales. Abdomen: Soft, non-tender, non-distended. Extremities: No palpable edema in the upper or lower extremities. Musculoskeletal: strength is intact throughout Psychiatric: Judgment and insight are intact. Affect is appropriate.  Skin : no rashes   Lab Findings: Lab Results  Component Value Date   WBC 10.9 (H) 02/15/2016   HGB 12.7 (L) 02/15/2016   HCT 37.3 (L) 02/15/2016   MCV 98.4 02/15/2016   PLT 238 02/15/2016    Radiographic Findings - images reviewed by me: Ct Chest Wo Contrast  Result Date: 08/28/2016 CLINICAL DATA:  Restaging lung  cancer. History of right upper lobectomy for lung cancer in 2012. Left lung cancer diagnosed in 2015. Radiation therapy completed. EXAM: CT CHEST WITHOUT CONTRAST TECHNIQUE: Multidetector CT imaging of the chest was performed following the standard protocol without IV contrast. COMPARISON:  Chest CT 02/21/2016 and 10/25/2015. FINDINGS: Cardiovascular: Diffuse atherosclerosis of the aorta, great vessels and coronary arteries. No acute vascular findings are seen on noncontrast imaging. The heart size is normal. There is no pericardial effusion. Mediastinum/Nodes: There are no enlarged mediastinal, hilar or axillary lymph nodes.Hilar assessment is limited by the lack of intravenous contrast, although the hilar contours appear unchanged. Moderate size hiatal hernia again noted. Lungs/Pleura: There is no pleural effusion. Status post right upper lobe resection. There are moderate emphysematous changes. The peribronchovascular nodularity noted in the right lower lobe on the most recent study has resolved. No suspicious findings are seen within the right lung. Although appearing slightly more prominent on the axial images (measuring 13 mm on image 93) the nodule anteriorly in the left lower lobe abutting the major fissure is not significantly changed from several prior studies based on the sagittal images. There is adjacent linear density, attributed to radiation change. There is a new irregular nodular density in the lingula measuring up to 8 mm on image 89. This appears to be within the radiation port on the sagittal images as well. This is also favored to be treatment related/inflammatory. Upper abdomen: The visualized upper abdomen appears stable without suspicious findings. The small left renal lesion noted previously is not imaged today. Musculoskeletal/Chest wall: There is no chest wall mass or suspicious osseous finding. Chronic Schmorl's nodes in the thoracic spine. IMPRESSION: 1. Similar appearance of subpleural  left lower lobe nodule and adjacent radiation changes compared with prior studies. 2. New irregular nodular density in the lingula, likely within the radiation port, also favored to be treatment related or inflammatory. 3. Interval resolution of patchy reticulonodular opacities at the right base. 4. No findings highly suspicious for metastatic disease. Continued CT follow-up recommended. Electronically Signed   By: Richardean Sale M.D.   On: 08/28/2016 14:55   Impression/Plan: Doing well. NED  CT Chest 08/28/16 appears to be with no evidence of recurrence.  It did show some changes favored to be treatment related or inflammatory.  He reports recent sx c/w PNA, responsive to the ABX.  He continues to follow in medical oncology with Dr. Whitney Muse.  The patient is requesting a cholesterol check with his next visit. He follows with primary care, however, he would prefer we manage this. I recommended he get his chloesterol checked with his PCP, however I will contact Dr. Whitney Muse to see if she can check his cholesterol at his next visit with her. I let him know that I personally am not up to date on guidelines managing hyperlipoidemia.   The patient would like to continue following in our clinic. I have ordered repeat CT scan early  June with follow up after to discuss these results. That will allow the results to be available for Dr. Whitney Muse to review when she sees him in mid-June.  _____________________________________   Eppie Gibson, MD   This document serves as a record of services personally performed by Eppie Gibson, MD. It was created on her behalf by Arlyce Harman, a trained medical scribe. The creation of this record is based on the scribe's personal observations and the provider's statements to them. This document has been checked and approved by the attending provider.

## 2016-09-15 DIAGNOSIS — J329 Chronic sinusitis, unspecified: Secondary | ICD-10-CM | POA: Diagnosis not present

## 2016-09-15 DIAGNOSIS — Z6824 Body mass index (BMI) 24.0-24.9, adult: Secondary | ICD-10-CM | POA: Diagnosis not present

## 2016-09-15 DIAGNOSIS — M5136 Other intervertebral disc degeneration, lumbar region: Secondary | ICD-10-CM | POA: Diagnosis not present

## 2016-09-15 DIAGNOSIS — Z1389 Encounter for screening for other disorder: Secondary | ICD-10-CM | POA: Diagnosis not present

## 2016-09-15 DIAGNOSIS — I10 Essential (primary) hypertension: Secondary | ICD-10-CM | POA: Diagnosis not present

## 2016-09-15 DIAGNOSIS — M549 Dorsalgia, unspecified: Secondary | ICD-10-CM | POA: Diagnosis not present

## 2016-09-15 DIAGNOSIS — J449 Chronic obstructive pulmonary disease, unspecified: Secondary | ICD-10-CM | POA: Diagnosis not present

## 2016-09-15 DIAGNOSIS — M1990 Unspecified osteoarthritis, unspecified site: Secondary | ICD-10-CM | POA: Diagnosis not present

## 2016-09-15 DIAGNOSIS — G894 Chronic pain syndrome: Secondary | ICD-10-CM | POA: Diagnosis not present

## 2016-09-15 DIAGNOSIS — C3432 Malignant neoplasm of lower lobe, left bronchus or lung: Secondary | ICD-10-CM | POA: Diagnosis not present

## 2016-09-15 DIAGNOSIS — J984 Other disorders of lung: Secondary | ICD-10-CM | POA: Diagnosis not present

## 2016-09-15 DIAGNOSIS — E785 Hyperlipidemia, unspecified: Secondary | ICD-10-CM | POA: Diagnosis not present

## 2016-12-08 DIAGNOSIS — M5136 Other intervertebral disc degeneration, lumbar region: Secondary | ICD-10-CM | POA: Diagnosis not present

## 2016-12-08 DIAGNOSIS — Z6823 Body mass index (BMI) 23.0-23.9, adult: Secondary | ICD-10-CM | POA: Diagnosis not present

## 2016-12-08 DIAGNOSIS — J984 Other disorders of lung: Secondary | ICD-10-CM | POA: Diagnosis not present

## 2016-12-08 DIAGNOSIS — M1990 Unspecified osteoarthritis, unspecified site: Secondary | ICD-10-CM | POA: Diagnosis not present

## 2016-12-08 DIAGNOSIS — M549 Dorsalgia, unspecified: Secondary | ICD-10-CM | POA: Diagnosis not present

## 2016-12-08 DIAGNOSIS — I10 Essential (primary) hypertension: Secondary | ICD-10-CM | POA: Diagnosis not present

## 2016-12-08 DIAGNOSIS — C3432 Malignant neoplasm of lower lobe, left bronchus or lung: Secondary | ICD-10-CM | POA: Diagnosis not present

## 2016-12-08 DIAGNOSIS — E785 Hyperlipidemia, unspecified: Secondary | ICD-10-CM | POA: Diagnosis not present

## 2016-12-08 DIAGNOSIS — L309 Dermatitis, unspecified: Secondary | ICD-10-CM | POA: Diagnosis not present

## 2016-12-08 DIAGNOSIS — M2550 Pain in unspecified joint: Secondary | ICD-10-CM | POA: Diagnosis not present

## 2016-12-08 DIAGNOSIS — C3490 Malignant neoplasm of unspecified part of unspecified bronchus or lung: Secondary | ICD-10-CM | POA: Diagnosis not present

## 2016-12-08 DIAGNOSIS — J309 Allergic rhinitis, unspecified: Secondary | ICD-10-CM | POA: Diagnosis not present

## 2017-02-06 ENCOUNTER — Telehealth: Payer: Self-pay | Admitting: *Deleted

## 2017-02-06 NOTE — Telephone Encounter (Signed)
Called patient to inform of CT for 02-12-17- arrival time - 8:45 am @ [facility], no restrictions to test, lvm for a return call

## 2017-02-12 ENCOUNTER — Ambulatory Visit (HOSPITAL_COMMUNITY)
Admission: RE | Admit: 2017-02-12 | Discharge: 2017-02-12 | Disposition: A | Discharge status: Home / Self Care | Payer: Medicare Other | Source: Ambulatory Visit | Attending: Radiation Oncology | Admitting: Radiation Oncology

## 2017-02-12 DIAGNOSIS — J432 Centrilobular emphysema: Secondary | ICD-10-CM | POA: Diagnosis not present

## 2017-02-12 DIAGNOSIS — K449 Diaphragmatic hernia without obstruction or gangrene: Secondary | ICD-10-CM | POA: Insufficient documentation

## 2017-02-12 DIAGNOSIS — R05 Cough: Secondary | ICD-10-CM | POA: Diagnosis not present

## 2017-02-12 DIAGNOSIS — I700 Atherosclerosis of aorta: Secondary | ICD-10-CM | POA: Insufficient documentation

## 2017-02-12 DIAGNOSIS — Z923 Personal history of irradiation: Secondary | ICD-10-CM | POA: Insufficient documentation

## 2017-02-12 DIAGNOSIS — I2510 Atherosclerotic heart disease of native coronary artery without angina pectoris: Secondary | ICD-10-CM | POA: Insufficient documentation

## 2017-02-12 DIAGNOSIS — C3432 Malignant neoplasm of lower lobe, left bronchus or lung: Secondary | ICD-10-CM

## 2017-02-12 DIAGNOSIS — R0602 Shortness of breath: Secondary | ICD-10-CM | POA: Diagnosis not present

## 2017-02-13 ENCOUNTER — Ambulatory Visit: Payer: Self-pay | Admitting: Radiation Oncology

## 2017-02-13 ENCOUNTER — Ambulatory Visit
Admission: RE | Admit: 2017-02-13 | Discharge: 2017-02-13 | Disposition: A | Discharge status: Home / Self Care | Payer: Medicare Other | Source: Ambulatory Visit | Attending: Radiation Oncology | Admitting: Radiation Oncology

## 2017-02-13 ENCOUNTER — Encounter: Payer: Self-pay | Admitting: Radiation Oncology

## 2017-02-13 VITALS — BP 135/72 | HR 72 | Temp 99.4°F | Ht 66.0 in | Wt 156.6 lb

## 2017-02-13 DIAGNOSIS — Z08 Encounter for follow-up examination after completed treatment for malignant neoplasm: Secondary | ICD-10-CM | POA: Insufficient documentation

## 2017-02-13 DIAGNOSIS — M545 Low back pain: Secondary | ICD-10-CM | POA: Diagnosis not present

## 2017-02-13 DIAGNOSIS — F17220 Nicotine dependence, chewing tobacco, uncomplicated: Secondary | ICD-10-CM | POA: Diagnosis not present

## 2017-02-13 DIAGNOSIS — Z85118 Personal history of other malignant neoplasm of bronchus and lung: Secondary | ICD-10-CM | POA: Diagnosis not present

## 2017-02-13 DIAGNOSIS — G8929 Other chronic pain: Secondary | ICD-10-CM | POA: Insufficient documentation

## 2017-02-13 DIAGNOSIS — Z923 Personal history of irradiation: Secondary | ICD-10-CM | POA: Insufficient documentation

## 2017-02-13 DIAGNOSIS — Z79899 Other long term (current) drug therapy: Secondary | ICD-10-CM | POA: Diagnosis not present

## 2017-02-13 DIAGNOSIS — I2510 Atherosclerotic heart disease of native coronary artery without angina pectoris: Secondary | ICD-10-CM

## 2017-02-13 DIAGNOSIS — Z720 Tobacco use: Secondary | ICD-10-CM | POA: Insufficient documentation

## 2017-02-13 DIAGNOSIS — C3432 Malignant neoplasm of lower lobe, left bronchus or lung: Secondary | ICD-10-CM

## 2017-02-13 DIAGNOSIS — Z716 Tobacco abuse counseling: Secondary | ICD-10-CM | POA: Diagnosis not present

## 2017-02-13 NOTE — Progress Notes (Signed)
Radiation Oncology         [phone] ________________________________  Name: Maurice Orozco MRN: [id number]  Date: 02/13/2017  DOB: [birth year]  Follow-Up Visit Note CC: Collene Mares MD  Outpatient  CC: Elsie Lincoln, MD  Farrel Gobble, MD  Diagnosis and Prior Radiotherapy:    ICD-10-CM   1. 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 CT scan showed stable post-treatment changes in the left lung with no evidence of residual/recurrent disease. He reports chronic lower back pain and sinus congestion. He is eating well. He denies shortness of breath or a cough. He has difficulty sleeping due to worrying about his mothers cancer diagnosis and declining health. The patient is not smoking, but he continues to chew tobacco.  No oral sores. He stopped chewing for a week, but started back up. He tried nicotine patches, but state they don't work since he likes chewing. Chewing gum does not help. He presents with his wife to the clinic.  ALLERGIES:  is allergic to fentanyl; gabapentin; and aspirin.  Meds: Current Outpatient Prescriptions  Medication Sig Dispense Refill  . albuterol (PROVENTIL HFA;VENTOLIN HFA) 108 (90 BASE) MCG/ACT inhaler Inhale 2 puffs into the lungs every 6 (six) hours as needed for wheezing or shortness of breath.     . ALPRAZolam (XANAX) 0.5 MG tablet Take 0.5 mg by mouth at bedtime as needed for sleep.     . Cimetidine (TAGAMET PO) Take 1 tablet by mouth as  needed (stomach). Taking about 8 daily    . fluticasone (FLONASE) 50 MCG/ACT nasal spray Place 2 sprays into the nose as needed for allergies.     . hydrochlorothiazide 25 MG tablet Take 25 mg by mouth daily. TAKES 1/2 TABLET DAILY     . HYDROcodone-acetaminophen (NORCO) 10-325 MG per tablet Take 1 tablet by mouth every 6 (six) hours as needed. TAKES 1 - 2 EVERY 4 HOURS FOR BACK PAIN     . pantoprazole (PROTONIX) 40 MG tablet Take 40 mg by mouth daily.    . simvastatin (ZOCOR) 40 MG tablet Take 40 mg by mouth daily at 6 PM.      No current facility-administered medications for this encounter.     Physical Findings:  height is 5\' 6"  (1.676 m) and weight is 156 lb 9.6 oz (71 kg). His temperature is 99.4 F (37.4 C). His blood pressure is 135/72 and his pulse is 72. His oxygen saturation is 100%.   General: Alert and oriented, in no acute distress HEENT: Head is normocephalic. Extraocular movements are intact. Dentures removed during exam. No oral or oropharyngeal lesions. Mucous membranes are moist. Heart: Regular in rate and rhythm, no murmurs. Chest: Clear to auscultation bilaterally with no rhonchi, wheezes, or rales. Abdomen: Soft, non-tender, non-distended. Extremities: No palpable edema in the upper or lower extremities. Musculoskeletal: strength is intact throughout Psychiatric: Judgment and insight are intact. Affect is appropriate.  Skin : no rashes   Lab Findings: Lab  Results  Component Value Date   WBC 10.9 (H) 02/15/2016   HGB 12.7 (L) 02/15/2016   HCT 37.3 (L) 02/15/2016   MCV 98.4 02/15/2016   PLT 238 02/15/2016    Radiographic Findings - images reviewed by me: Ct Chest Wo Contrast  Result Date: 02/12/2017 CLINICAL DATA:  71 year old male with history of lung cancer. Cough and shortness of breath. Status post right upper lobectomy and chemotherapy (completed in March 2017). Evaluate for potential recurrence. EXAM: CT CHEST WITHOUT CONTRAST TECHNIQUE: Multidetector CT  imaging of the chest was performed following the standard protocol without IV contrast. COMPARISON:  Multiple priors, most recently 08/28/2016. FINDINGS: Cardiovascular: Heart size is normal. There is no significant pericardial fluid, thickening or pericardial calcification. There is aortic atherosclerosis, as well as atherosclerosis of the great vessels of the mediastinum and the coronary arteries, including calcified atherosclerotic plaque in the left main, left anterior descending, left circumflex and right coronary arteries. Mediastinum/Nodes: No pathologically enlarged mediastinal or hilar lymph nodes. Please note that accurate exclusion of hilar adenopathy is limited on noncontrast CT scans. Moderate-sized hiatal hernia. No axillary lymphadenopathy. Lungs/Pleura: Status post right upper lobectomy. Compensatory hyperexpansion of the right middle and lower lobes. There continues to be an ill-defined area of ground-glass attenuation, septal thickening and architectural distortion with some associated nodularity in the left mid lung involving portions of the left upper and lower lobes, with the largest nodule abutting the left major fissure in the anterior aspect of the superior segment of the left lower lobe measuring 10 mm on today's study, slightly smaller than the prior study. These findings are compatible with evolving chronic postradiation changes. Other previous cluster of small nodules in the left upper lobe seen on the prior study has resolved. There continues to be diffuse bronchial wall thickening with some patchy areas of mild peribronchovascular micronodularity, which are similar to prior studies, likely to reflect areas of very mild mucoid impaction. Mild scarring in the inferior segment of the lingula is unchanged. Moderate centrilobular and mild paraseptal emphysema. No acute consolidative airspace disease. No pleural effusions. Upper Abdomen: Aortic atherosclerosis. Musculoskeletal: There are no  aggressive appearing lytic or blastic lesions noted in the visualized portions of the skeleton. IMPRESSION: 1. Stable post treatment related changes of prior radiation therapy in the left lung redemonstrated, as above. No evidence to suggest residual/recurrent disease on today's study. 2. Aortic atherosclerosis, in addition to left main and 3 vessel coronary artery disease. Please note that although the presence of coronary artery calcium documents the presence of coronary artery disease, the severity of this disease and any potential stenosis cannot be assessed on this non-gated CT examination. Assessment for potential risk factor modification, dietary therapy or pharmacologic therapy may be warranted, if clinically indicated. 3. Moderate-sized hiatal hernia. 4. Additional incidental findings, as above. Electronically Signed   By: Vinnie Langton M.D.   On: 02/12/2017 09:47   Impression/Plan: Doing well. NED on clinical exam or CT chest on 02/12/17.  On CT scan, he has aortic arthrosclerosis and coronary artery disease. I spoke w./ PCP Dr. Redmond School in [place], [place] who concurred that a referral to cardiology will be prudent - done.  I asked the patient today about tobacco use. The patient uses chewing  tobacco.  I advised the patient to quit. Services were offered by me today including outpatient counseling and pharmacotherapy. I assessed for the willingness to attempt to quit and provided encouragement and demonstrated willingness to make referrals and/or prescriptions to help the patient  attempt to quit. The patient has follow-up with the oncologic team to touch base on their tobacco use and /or cessation efforts. He declines nicotine supplements, such as gum.  Over 3 minutes were spent on this issue.  I advised the patient to set his quit date on 02/20/17 and he agrees.  He has an appointment to see the new medical oncologist in [place] in July. I will order repeat CT scan in December 2018 with a  follow up afterwards. _____________________________________   Eppie Gibson, MD  This document serves as a record of services personally performed by Eppie Gibson, MD. It was created on her behalf by Darcus Austin, a trained medical scribe. The creation of this record is based on the scribe's personal observations and the provider's statements to them. This document has been checked and approved by the attending provider.

## 2017-02-13 NOTE — Progress Notes (Signed)
Mr. Geurts is here for follow up of radiation completed 07/10/2014 to his Left Lower Lung nodule. He reports chronic lower back pain. He denies shortness of breath, or coughing. He is eating well. He does report sinus congestion. He had a CT Chest done 02/12/17 and is here for review today. He has an appointment to see the new medical oncologist in [place] in July.   BP 135/72   Pulse 72   Temp 99.4 F (37.4 C)   Ht 5\' 6"  (1.676 m)   Wt 156 lb 9.6 oz (71 kg)   SpO2 100% Comment: room air  BMI 25.28 kg/m    Wt Readings from Last 3 Encounters:  02/13/17 156 lb 9.6 oz (71 kg)  08/29/16 165 lb 9.6 oz (75.1 kg)  02/22/16 160 lb 6.4 oz (72.8 kg)

## 2017-02-16 ENCOUNTER — Telehealth: Payer: Self-pay | Admitting: *Deleted

## 2017-02-16 NOTE — Telephone Encounter (Signed)
CALLED PATIENT TO INFORM OF APPT. WITH DR. DAVID HARDING ON 02-27-17- ARRIVAL TIME - 7:45 AM , ADDRESS- [address] PH. NO. - [phone], SPOKE WITH PATIENT AND HE IS AWARE OF THIS APPT.

## 2017-02-18 ENCOUNTER — Ambulatory Visit: Payer: Self-pay | Admitting: Radiation Oncology

## 2017-02-20 ENCOUNTER — Ambulatory Visit (HOSPITAL_COMMUNITY): Payer: Medicare Other

## 2017-02-20 ENCOUNTER — Other Ambulatory Visit (HOSPITAL_COMMUNITY): Payer: Medicare Other

## 2017-02-27 ENCOUNTER — Ambulatory Visit: Payer: Medicare Other | Admitting: Cardiology

## 2017-03-05 DIAGNOSIS — J984 Other disorders of lung: Secondary | ICD-10-CM | POA: Diagnosis not present

## 2017-03-05 DIAGNOSIS — G894 Chronic pain syndrome: Secondary | ICD-10-CM | POA: Diagnosis not present

## 2017-03-05 DIAGNOSIS — F419 Anxiety disorder, unspecified: Secondary | ICD-10-CM | POA: Diagnosis not present

## 2017-03-05 DIAGNOSIS — I10 Essential (primary) hypertension: Secondary | ICD-10-CM | POA: Diagnosis not present

## 2017-03-08 HISTORY — PX: OTHER SURGICAL HISTORY: SHX169

## 2017-03-13 ENCOUNTER — Encounter (HOSPITAL_COMMUNITY): Payer: Self-pay

## 2017-03-13 ENCOUNTER — Encounter (HOSPITAL_COMMUNITY): Payer: Medicare Other | Attending: Oncology

## 2017-03-13 ENCOUNTER — Encounter (HOSPITAL_BASED_OUTPATIENT_CLINIC_OR_DEPARTMENT_OTHER): Payer: Medicare Other | Admitting: Oncology

## 2017-03-13 VITALS — BP 125/59 | HR 70 | Resp 20 | Ht 66.0 in | Wt 155.0 lb

## 2017-03-13 DIAGNOSIS — C3492 Malignant neoplasm of unspecified part of left bronchus or lung: Secondary | ICD-10-CM | POA: Insufficient documentation

## 2017-03-13 DIAGNOSIS — Z716 Tobacco abuse counseling: Secondary | ICD-10-CM

## 2017-03-13 DIAGNOSIS — Z85118 Personal history of other malignant neoplasm of bronchus and lung: Secondary | ICD-10-CM

## 2017-03-13 DIAGNOSIS — C3432 Malignant neoplasm of lower lobe, left bronchus or lung: Secondary | ICD-10-CM | POA: Diagnosis not present

## 2017-03-13 DIAGNOSIS — C3490 Malignant neoplasm of unspecified part of unspecified bronchus or lung: Secondary | ICD-10-CM

## 2017-03-13 LAB — COMPREHENSIVE METABOLIC PANEL
ALBUMIN: 3.9 g/dL (ref 3.5–5.0)
ALT: 18 U/L (ref 17–63)
ANION GAP: 9 (ref 5–15)
AST: 19 U/L (ref 15–41)
Alkaline Phosphatase: 67 U/L (ref 38–126)
BUN: 13 mg/dL (ref 6–20)
CHLORIDE: 97 mmol/L — AB (ref 101–111)
CO2: 29 mmol/L (ref 22–32)
Calcium: 8.7 mg/dL — ABNORMAL LOW (ref 8.9–10.3)
Creatinine, Ser: 0.83 mg/dL (ref 0.61–1.24)
GFR calc Af Amer: >60 mL/min (ref >60)
GFR calc non Af Amer: >60 mL/min (ref >60)
GLUCOSE: 110 mg/dL — AB (ref 65–99)
POTASSIUM: 3.5 mmol/L (ref 3.5–5.1)
Sodium: 135 mmol/L (ref 135–145)
Total Bilirubin: 0.6 mg/dL (ref 0.3–1.2)
Total Protein: 6.8 g/dL (ref 6.5–8.1)

## 2017-03-13 LAB — CBC WITH DIFFERENTIAL/PLATELET
BASOS ABS: 0.1 10*3/uL (ref 0.0–0.1)
BASOS PCT: 1 %
EOS ABS: 0.2 10*3/uL (ref 0.0–0.7)
EOS PCT: 3 %
HCT: 38 % — ABNORMAL LOW (ref 39.0–52.0)
Hemoglobin: 12.7 g/dL — ABNORMAL LOW (ref 13.0–17.0)
Lymphocytes Relative: 20 %
Lymphs Abs: 1.6 10*3/uL (ref 0.7–4.0)
MCH: 32 pg (ref 26.0–34.0)
MCHC: 33.4 g/dL (ref 30.0–36.0)
MCV: 95.7 fL (ref 78.0–100.0)
MONO ABS: 0.6 10*3/uL (ref 0.1–1.0)
Monocytes Relative: 8 %
Neutro Abs: 5.4 10*3/uL (ref 1.7–7.7)
Neutrophils Relative %: 68 %
PLATELETS: 222 10*3/uL (ref 150–400)
RBC: 3.97 MIL/uL — ABNORMAL LOW (ref 4.22–5.81)
RDW: 13 % (ref 11.5–15.5)
WBC: 7.8 10*3/uL (ref 4.0–10.5)

## 2017-03-13 NOTE — Progress Notes (Signed)
[facility], [place], [place] [address]   DIAGNOSIS: Adenocarcinoma of lung   Staging form: Lung, AJCC 7th Edition     Clinical: Stage IA (T1a, N0, M0) - Signed by Baird Cancer, PA on 05/06/2011   SUMMARY OF ONCOLOGIC HISTORY:   Adenocarcinoma of lung ([facility])   09/30/2010 Cancer Staging    CT of chest showed spiculated lesion      10/15/2010 Surgery    Right upper lobectomy by Dr. Arlyce Dice      10/16/2010 Remission         10/24/2013 Imaging    CT Chest-  Enlarging subpleural nodule in the left lower lobe, now 9 mm, with distortion of the overlying pleura. Finding is concerning for a small bronchogenic carcinoma      01/23/2014 Imaging    CT Chest- Left lower lobe nodule has enlarged from 11/01/2012 and is worrisome for primary bronchogenic carcinoma.       07/04/2014 - 07/10/2014 Radiation Therapy    SBRT by Dr. Isidore Moos in 3 fractions.      07/05/2015 Imaging    perifissural nodule in LLL appears slightly smaller with parenchymal opacity attrib to XRT, likely inflamm pathcy airspace dz more inferiorly in LLL, airspace dz in RLL resolved. no metastatic disease       Primary cancer of left lower lobe of lung ([facility])   06/18/2014 Initial Diagnosis    Left lower lobe nodule suspicious for primary lung carcinoma      07/04/2014 - 07/10/2014 Radiation Therapy    SBRT      10/12/2014 Imaging    No acute findings within the chest. Decrease in size of left lower lobe perifissural nodule compatible with response to therapy. No new or progressive disease identified within the chest.       CURRENT THERAPY: Observation  INTERVAL HISTORY: Maurice Orozco 71 y.o. male returns for follow-up of lung cancer with his wife. Patient had a restaging scan performed on 02/12/17 which demonstrated postradiation changes but otherwise no evidence of recurrence or metastatic disease. He states that there is been no  changes in his overall health in the past one year. He denies any weight changes, chest pain, shortness breath, abdominal pain, focal weakness. He currently is not smoking tobacco but continues to chew tobacco.   MEDICAL HISTORY: Past Medical History:  Diagnosis Date  . Adenocarcinoma of lung ([facility]) 05/06/2011  . Allergy   . Anxiety   . Arthritis    finger  . Back fracture    DUE TO AA IN 1970  . Cancer ([facility])    rt lung/dx 2011/surg only  . Chronic back pain   . COPD (chronic obstructive pulmonary disease) ([facility])   . COPD (chronic obstructive pulmonary disease) ([facility]) 05/06/2011  . Emphysema 05/06/2011  . Emphysema of lung ([facility])   . GERD (gastroesophageal reflux disease)   . High cholesterol   . History of radiation therapy 07/04/14, 07/06/14, 07/10/14   LLL lung nodule/54 Gy/3 fx- SBRT  . Hx of radiation therapy 07/04/14, 07/06/14, 07/10/14   Left Lower Lung in 3 fractions  . Hypercholesterolemia 05/06/2011  . Hypertension   . Hypertension 05/06/2011  . Lung cancer ([facility])   . Pneumonia due to Streptococcus    HX. POST OPERATIVELY  . Stress fx pelvis    DUE TO AA IN 1970    has Back fracture; Adenocarcinoma of lung ([facility]); Hypertension; COPD (chronic  obstructive pulmonary disease) ([facility]); Emphysema; Hypercholesterolemia; Primary cancer of left lower lobe of lung (HCC); GERD (gastroesophageal reflux disease); and Nausea without vomiting on his problem list.     is allergic to fentanyl; gabapentin; and aspirin.  Maurice Orozco had no medications administered during this visit.  SURGICAL HISTORY: Past Surgical History:  Procedure Laterality Date  . BACK SURGERY    . BACK SURGERY  2011   metal plate l-4,L5  . LUNG LOBECTOMY  2012   RT UPPER LOBE    SOCIAL HISTORY: Social History   Social History  . Marital status: Married    Spouse name: N/A  . Number of children: 3  . Years of education: N/A   Occupational History  . Not on file.   Social History Main Topics  . Smoking  status: Former Smoker    Types: Cigarettes    Quit date: 09/08/2010  . Smokeless tobacco: Current User    Types: Chew     Comment: still chews tobacco  . Alcohol use No  . Drug use: No  . Sexual activity: Not Currently   Other Topics Concern  . Not on file   Social History Narrative  . No narrative on file    FAMILY HISTORY: Family History  Problem Relation Age of Onset  . Hypertension Mother   . Kidney disease Father   . Heart attack Brother   . Cancer Maternal Uncle        prostate cancer  . Cancer Maternal Uncle        prostate cancer  . Cancer Maternal Uncle        prostate cancer  . Cancer Maternal Uncle        prostate cancer    Review of Systems  Constitutional: Negative for chills and fever.  HENT: Negative for hearing loss, sore throat and tinnitus.   Eyes: Negative for blurred vision, photophobia and discharge.  Respiratory: Negative for cough, hemoptysis, shortness of breath and wheezing.   Cardiovascular: Negative for chest pain, palpitations, orthopnea, claudication and leg swelling.  Gastrointestinal: Negative for abdominal pain, constipation, diarrhea, melena, nausea and vomiting.  Genitourinary: Negative for dysuria and hematuria.  Musculoskeletal: Negative for back pain, joint pain and myalgias.  Skin: Negative for itching and rash.  Neurological: Negative for dizziness, weakness and headaches.  Endo/Heme/Allergies: Negative for environmental allergies and polydipsia. Does not bruise/bleed easily.  Psychiatric/Behavioral: Negative for depression. The patient is not nervous/anxious and does not have insomnia.       PHYSICAL EXAMINATION  ECOG PERFORMANCE STATUS: 1 - Symptomatic but completely ambulatory  Vitals:   03/13/17 1012  BP: (!) 125/59  Pulse: 70  Resp: 20    Physical Exam  Constitutional: He is oriented to person, place, and time and well-developed, well-nourished, and in no distress.  HENT:  Head: Normocephalic and atraumatic.    Nose: Nose normal.  Mouth/Throat: Oropharynx is clear and moist. No oropharyngeal exudate.  Eyes: Conjunctivae and EOM are normal. Pupils are equal, round, and reactive to light. Right eye exhibits no discharge. Left eye exhibits no discharge. No scleral icterus.  Neck: Normal range of motion. Neck supple. No tracheal deviation present. No thyromegaly present.  Cardiovascular: Normal rate, regular rhythm and normal heart sounds.  Exam reveals no gallop and no friction rub. No murmur heard. Pulmonary/Chest: Effort normal and breath sounds normal. He has no wheezes. He has no rales.  Abdominal: Soft. Bowel sounds are normal. He exhibits no distension and no mass. There is no tenderness.  There is no rebound and no guarding.   Musculoskeletal: Normal range of motion. He exhibits no edema.  Lymphadenopathy:    He has no cervical adenopathy.  Neurological: He is alert and oriented to person, place, and time. He has normal reflexes. No cranial nerve deficit. Gait normal. Coordination normal.  Skin: Skin is warm and dry. No rash noted.  Psychiatric: Mood, memory, affect and judgment normal.  Nursing note and vitals reviewed.   LABORATORY DATA: I have reviewed the data as listed.  CBC    Component Value Date/Time   WBC 7.8 03/13/2017 0945   RBC 3.97 (L) 03/13/2017 0945   HGB 12.7 (L) 03/13/2017 0945   HCT 38.0 (L) 03/13/2017 0945   PLT 222 03/13/2017 0945   MCV 95.7 03/13/2017 0945   MCH 32.0 03/13/2017 0945   MCHC 33.4 03/13/2017 0945   RDW 13.0 03/13/2017 0945   LYMPHSABS 1.6 03/13/2017 0945   MONOABS 0.6 03/13/2017 0945   EOSABS 0.2 03/13/2017 0945   BASOSABS 0.1 03/13/2017 0945   CMP     Component Value Date/Time   NA 135 03/13/2017 0945   K 3.5 03/13/2017 0945   CL 97 (L) 03/13/2017 0945   CO2 29 03/13/2017 0945   GLUCOSE 110 (H) 03/13/2017 0945   BUN 13 03/13/2017 0945   BUN 23.9 07/05/2015 1239   CREATININE 0.83 03/13/2017 0945   CREATININE 0.9 07/05/2015 1239    CALCIUM 8.7 (L) 03/13/2017 0945   PROT 6.8 03/13/2017 0945   ALBUMIN 3.9 03/13/2017 0945   AST 19 03/13/2017 0945   ALT 18 03/13/2017 0945   ALKPHOS 67 03/13/2017 0945   BILITOT 0.6 03/13/2017 0945   GFRNONAA >60 03/13/2017 0945   GFRAA >60 03/13/2017 0945     RADIOLOGY:  Study Result     CLINICAL DATA: Left lung cancer diagnosed in October 2015. Radiation therapy completed. History of right lung cancer with right upper lobectomy in 2011.  EXAM: CT CHEST WITHOUT CONTRAST  TECHNIQUE: Multidetector CT imaging of the chest was performed following the standard protocol without IV contrast.  COMPARISON: Chest CT 07/05/2015 and 03/29/2015. PET-CT 01/24/2015  FINDINGS: Mediastinum/Nodes: There are no enlarged mediastinal, hilar or axillary lymph nodes.Hilar assessment is mildly limited by the lack of intravenous contrast, although the hilar contours are unchanged. A moderate size hiatal hernia is again noted. The heart size is normal. There is no pericardial effusion. There is atherosclerosis of the aorta, great vessels and coronary arteries.  Lungs/Pleura: There is no pleural effusion. Stable postsurgical changes from previous right upper lobe resection. The perifissural left lower lobe nodule has not significantly changed, measuring 8 mm on image 38. Surrounding linear parenchymal scarring has mildly improved. There are no new or enlarging pulmonary nodules. The patchy left lower lobe airspace disease seen on the most recent study has resolved. There are stable emphysematous changes.  Upper abdomen: Appears stable without suspicious findings.  Musculoskeletal/Chest wall: There is no chest wall mass or suspicious osseous finding.  IMPRESSION: 1. Stable perifissural left lower lobe nodule with improving surrounding radiation changes. 2. Resolution of patchy airspace disease in left lower lobe. 3. No evidence of metastatic disease.   Electronically  Signed  By: Richardean Sale M.D.  On: 10/25/2015 12:24    ASSESSMENT and THERAPY PLAN:  History of adenocarcinoma of RUL Stage IA adenocarcinoma of the left lower lung status post SBRT Tobacco use  71 year old male with an early stage adenocarcinoma of the lung. He completed SBRT with Dr. Isidore Moos.  Last  CT imaging performed was reviewed with the patient. Labs reviewed.  He continues to follow with Dr. Isidore Moos at regular intervals q6 monts. Will defer ordering of any CTs to Dr. Isidore Moos since she has been getting them every 6 months.   Discussed cessation of tobacco chewing in detail with the patient and he states he will make the effort to quit.  RTC in 1 year for follow up with CBC, CMP.  All questions were answered. The patient knows to call the clinic with any problems, questions or concerns. We can certainly see the patient much sooner if necessary.   Twana First, MD

## 2017-03-23 ENCOUNTER — Ambulatory Visit (INDEPENDENT_AMBULATORY_CARE_PROVIDER_SITE_OTHER): Payer: Medicare Other | Admitting: Cardiology

## 2017-03-23 ENCOUNTER — Encounter: Payer: Self-pay | Admitting: Cardiology

## 2017-03-23 VITALS — BP 168/72 | HR 87 | Ht 66.0 in | Wt 152.6 lb

## 2017-03-23 DIAGNOSIS — I10 Essential (primary) hypertension: Secondary | ICD-10-CM | POA: Diagnosis not present

## 2017-03-23 DIAGNOSIS — I2510 Atherosclerotic heart disease of native coronary artery without angina pectoris: Secondary | ICD-10-CM

## 2017-03-23 DIAGNOSIS — E785 Hyperlipidemia, unspecified: Secondary | ICD-10-CM

## 2017-03-23 NOTE — Patient Instructions (Signed)
SCHEDULE AT Maurice Orozco has requested that you have cardiac CT CALCIUM SCORING . Cardiac computed tomography (CT) is a painless test that uses an x-ray machine to take clear, detailed pictures of your heart. For further information please visit [email or web address]. Please follow instruction sheet as given.    NO OTHER CHANGE WITH MEDICATION     Your physician recommends that you schedule a follow-up appointment in [place].

## 2017-03-23 NOTE — Progress Notes (Signed)
PCP: Redmond School, MD  Clinic Note: Chief Complaint  Patient presents with  . New Evaluation    Coronary artery calcification    HPI: Maurice Orozco is a 71 y.o. male who is being seen today for the evaluation of three-vessel coronary artery disease calcification noted on CT scan at the request of Dr. Peyton Bottoms & Dr. Gerarda Fraction..  Maurice Orozco was by Dr. Peyton Bottoms in oncology for follow-up of a chest CT that showed evidence of coronary artery and aortic calcification. 3 vessel CAD as noted. He is therefore referred for cardiology evaluation. The, he has had CT scans for several years which have mentioned coronary calcification.  Recent Hospitalizations: None  Studies Personally Reviewed - (if available, images/films reviewed: From Epic Chart or Care Everywhere)  Chest CT June 2018: Aortic atherosclerosis as well as atherosclerosis of the great vessels in the mediastinum as well as coronary arteries (L Main, LAD, circumflex and RCA)  Interval History: Maurice Orozco presents here today for cardiac evaluation. He has long-standing COPD and has had lung cancer status post resection as well as chemotherapy and radiation and so therefore he has some baseline exertional dyspnea. However he doesn't notice any change in his dyspnea since being referred. He has not had any symptoms that he knows of consistent with angina such as chest tightness or pressure with rest or exertion. No PND or orthopnea or edema besides the mid mild puffiness in the feet and in the day. He denies any rapid irregular heartbeat, but does have some "flip-flopping every now". Nothing to trigger lightheadedness, dizziness or syncope/near syncope.   no TIA or amaurosis fugax symptoms. No claudication symptoms    ROS: A comprehensive was performed. Review of Systems  Constitutional: Negative for chills, fever, malaise/fatigue and weight loss.  HENT: Negative for congestion and nosebleeds.   Eyes: Negative for blurred  vision.       Vision seems to be getting worse  Respiratory: Positive for cough (Chronic smoker's cough), sputum production (Only whitish sputum from his morning cough nothing suggestive of infectious etiology), shortness of breath (Baseline) and wheezing.        Is cough did improve after having quit smoking years ago. But is still present and has not changed over last several months.  Gastrointestinal: Negative for blood in stool and melena.  Genitourinary: Positive for frequency (He does have nocturia). Negative for dysuria and hematuria.  Musculoskeletal: Negative for joint pain.  Skin: Negative.   Neurological: Positive for dizziness (Sometimes with acute change in position). Negative for focal weakness and weakness.  Psychiatric/Behavioral: Negative for depression and memory loss. The patient is not nervous/anxious.   All other systems reviewed and are negative.  I have reviewed and (if needed) personally updated the patient's problem list, medications, allergies, past medical and surgical history, social and family history.   Past Medical History:  Diagnosis Date  . Adenocarcinoma of lung ([place]) 05/06/2011   rt lung/dx 2011/surg only  . Allergy   . Anxiety   . Arthritis    finger  . Back fracture    DUE TO AA IN 1970  . Chronic back pain   . 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 Essential hypertension 05/06/2011  . GERD (gastroesophageal reflux disease)   . High cholesterol   . History of radiation therapy 07/04/14, 07/06/14, 07/10/14   LLL lung nodule/54 Gy/3 fx- SBRT  . Hypercholesterolemia 05/06/2011  . Pneumonia due to Streptococcus  HX. POST OPERATIVELY  . Stress fx pelvis    DUE TO AA IN 1970    Past Surgical History:  Procedure Laterality Date  . BACK SURGERY    . BACK SURGERY  2011   metal plate l-4,L5  . LUNG LOBECTOMY  2012   RT UPPER LOBE    Current Meds  Medication Sig  . albuterol (PROVENTIL HFA;VENTOLIN  HFA) 108 (90 BASE) MCG/ACT inhaler Inhale 2 puffs into the lungs every 6 (six) hours as needed for wheezing or shortness of breath.   . ALPRAZolam (XANAX) 0.5 MG tablet Take 0.5 mg by mouth at bedtime as needed for sleep.   . Cimetidine (TAGAMET PO) Take 1 tablet by mouth as needed (stomach). Taking about 8 daily  . fluticasone (FLONASE) 50 MCG/ACT nasal spray Place 2 sprays into the nose as needed for allergies.   . hydrochlorothiazide 25 MG tablet Take 25 mg by mouth daily. TAKES 1/2 TABLET DAILY   . HYDROcodone-acetaminophen (NORCO) 10-325 MG per tablet Take 1 tablet by mouth every 6 (six) hours as needed. TAKES 1 - 2 EVERY 4 HOURS FOR BACK PAIN   . pantoprazole (PROTONIX) 40 MG tablet Take 40 mg by mouth daily.  . simvastatin (ZOCOR) 40 MG tablet Take 40 mg by mouth daily at 6 PM.     Allergies  Allergen Reactions  . Fentanyl Other (See Comments)    CAUSED HALLUCINATIONS/05-03-13 pt reports it was oral fentanyl that caused hallucinations  . Gabapentin Other (See Comments)    Bones throbbed, headaches, weakness  . Aspirin Nausea Only    Social History   Social History  . Marital status: Married    Spouse name: N/A  . Number of children: 3  . Years of education: N/A   Social History Main Topics  . Smoking status: Former Smoker    Types: Cigarettes    Quit date: 09/08/2010  . Smokeless tobacco: Current User    Types: Chew     Comment: still chews tobacco  . Alcohol use No  . Drug use: No  . Sexual activity: Not Currently   Other Topics Concern  . None   Social History Narrative  . None    family history includes Cancer in his maternal uncle, maternal uncle, maternal uncle, and maternal uncle; Heart attack in his brother; Hypertension in his mother; Kidney disease in his father.  Wt Readings from Last 3 Encounters:  03/23/17 152 lb 9.6 oz (69.2 kg)  03/13/17 155 lb (70.3 kg)  02/13/17 156 lb 9.6 oz (71 kg)    PHYSICAL EXAM BP (!) 168/72   Pulse 87   Ht 5\' 6"   (1.676 m)   Wt 152 lb 9.6 oz (69.2 kg)   BMI 24.63 kg/m  General appearance: alert, cooperative, appears stated age, no distress. Well-nourished, well-groomed. Relatively healthy-appearing. HEENT: [place]/AT, EOMI, MMM, anicteric sclera Neck: no adenopathy, no significant carotid bruit and no JVD Lungs: clear to auscultation bilaterally, normal percussion bilaterally and non-labored Heart: regular rate and rhythm, S1 &S2 normal, no murmur, click, rub or gallop; nondisplaced PMI Abdomen: soft, non-tender; bowel sounds normal; no masses,  no organomegaly; no HJR Extremities: extremities normal, atraumatic, no cyanosis, and edema - trivial Pulses: 2+ and symmetric -mildly diminished pedal pulses, but palpable Skin: mobility and turgor normal, no evidence of bleeding or bruising and no lesions noted  Neurologic: Mental status: Alert & oriented x 3, thought content appropriate; non-focal exam.  Pleasant mood & affect. Cranial nerves: normal (II-XII grossly intact)  Adult ECG Report  Rate: 87 ;  Rhythm: normal sinus rhythm and Normal axis, intervals and durations;   Narrative Interpretation: Normal EKG   Other studies Reviewed: Additional studies/ records that were reviewed today include:  Recent Labs:  No results found for: CHOL, HDL, LDLCALC, LDLDIRECT, TRIG, CHOLHDL Lab Results  Component Value Date   CREATININE 0.83 03/13/2017   BUN 13 03/13/2017   NA 135 03/13/2017   K 3.5 03/13/2017   CL 97 (L) 03/13/2017   CO2 29 03/13/2017  -- Labs are followed by PCP.  ASSESSMENT / PLAN: Problem List Items Addressed This Visit    Coronary artery calcification seen on CAT scan - Primary (Chronic)    Three-vessel coronary artery calcification in a patient with hypertension, hyperlipidemia and a long standing history of smoking. He has baseline resting and exertional dyspnea, but no real change. Defer assessment of the extent of cortical calcification, we'll first start with a coronary calcium  score. Provided this may likely be high, we can then proceed with more detail evaluation coronary CTA and possibly CT FFR. I would prefer this evaluation to a Myoview given the presence of multivessel disease noted already on CT scan.      Relevant Orders   EKG 12-Lead (Completed)   CT CARDIAC SCORING   Dyslipidemia, goal LDL below 100 (Chronic)    With the stent of CAD noted already on CT scanning, he at least should warrant target LDL less than 100. Following initial evaluation, decompensated. Becomes apparent that he has more significant CAD, with target would be less than 70 LDL. He is on simvastatin and I don't have the most recent labs on him. -- Next step would probably be to switch either atorvastatin rosuvastatin if more potency as needed.      Relevant Orders   CT CARDIAC SCORING   Essential hypertension (Chronic)    Blood pressure is high today on HCTZ only. We can reassess his pressures after his stress test and potentially treat accordingly. I don't react to 1 recording only. Also the extent of CAD with determine the best appropriate antiplatelet agent to add.      Relevant Orders   EKG 12-Lead (Completed)   CT CARDIAC SCORING      Current medicines are reviewed at length with the patient today. (+/- concerns) n/a The following changes have been made: n/a  Patient Instructions  SCHEDULE AT [facility] has requested that you have cardiac CT CALCIUM SCORING . Cardiac computed tomography (CT) is a painless test that uses an x-ray machine to take clear, detailed pictures of your heart. For further information please visit [email or web address]. Please follow instruction sheet as given.    NO OTHER CHANGE WITH MEDICATION     Your physician recommends that you schedule a follow-up appointment in [place].      Studies Ordered:   Orders Placed This Encounter  Procedures  . CT CARDIAC SCORING  . EKG 12-Lead       Glenetta Hew, M.D., M.S. Interventional Cardiologist   Pager # [phone] Phone # [phone] [address] [place], [place] [address]

## 2017-03-24 ENCOUNTER — Encounter: Payer: Self-pay | Admitting: Cardiology

## 2017-03-24 NOTE — Assessment & Plan Note (Signed)
With the stent of CAD noted already on CT scanning, he at least should warrant target LDL less than 100. Following initial evaluation, decompensated. Becomes apparent that he has more significant CAD, with target would be less than 70 LDL. He is on simvastatin and I don't have the most recent labs on him. -- Next step would probably be to switch either atorvastatin rosuvastatin if more potency as needed.

## 2017-03-24 NOTE — Assessment & Plan Note (Signed)
Blood pressure is high today on HCTZ only. We can reassess his pressures after his stress test and potentially treat accordingly. I don't react to 1 recording only. Also the extent of CAD with determine the best appropriate antiplatelet agent to add.

## 2017-03-24 NOTE — Assessment & Plan Note (Signed)
Three-vessel coronary artery calcification in a patient with hypertension, hyperlipidemia and a long standing history of smoking. He has baseline resting and exertional dyspnea, but no real change. Defer assessment of the extent of cortical calcification, we'll first start with a coronary calcium score. Provided this may likely be high, we can then proceed with more detail evaluation coronary CTA and possibly CT FFR. I would prefer this evaluation to a Myoview given the presence of multivessel disease noted already on CT scan.

## 2017-03-31 ENCOUNTER — Inpatient Hospital Stay: Admission: RE | Admit: 2017-03-31 | Payer: Medicare Other | Source: Ambulatory Visit

## 2017-04-03 ENCOUNTER — Ambulatory Visit (INDEPENDENT_AMBULATORY_CARE_PROVIDER_SITE_OTHER)
Admission: RE | Admit: 2017-04-03 | Discharge: 2017-04-03 | Disposition: A | Discharge status: Home / Self Care | Payer: Self-pay | Source: Ambulatory Visit | Attending: Cardiology | Admitting: Cardiology

## 2017-04-03 DIAGNOSIS — I10 Essential (primary) hypertension: Secondary | ICD-10-CM

## 2017-04-03 DIAGNOSIS — I2510 Atherosclerotic heart disease of native coronary artery without angina pectoris: Secondary | ICD-10-CM

## 2017-04-03 DIAGNOSIS — E785 Hyperlipidemia, unspecified: Secondary | ICD-10-CM

## 2017-04-08 HISTORY — PX: NM MYOVIEW LTD: HXRAD82

## 2017-04-30 DIAGNOSIS — M4186 Other forms of scoliosis, lumbar region: Secondary | ICD-10-CM | POA: Diagnosis not present

## 2017-04-30 DIAGNOSIS — Z981 Arthrodesis status: Secondary | ICD-10-CM | POA: Diagnosis not present

## 2017-04-30 DIAGNOSIS — I708 Atherosclerosis of other arteries: Secondary | ICD-10-CM | POA: Diagnosis not present

## 2017-04-30 DIAGNOSIS — M47896 Other spondylosis, lumbar region: Secondary | ICD-10-CM | POA: Diagnosis not present

## 2017-04-30 DIAGNOSIS — M4316 Spondylolisthesis, lumbar region: Secondary | ICD-10-CM | POA: Diagnosis not present

## 2017-04-30 DIAGNOSIS — M4726 Other spondylosis with radiculopathy, lumbar region: Secondary | ICD-10-CM | POA: Diagnosis not present

## 2017-05-01 ENCOUNTER — Telehealth: Payer: Self-pay | Admitting: *Deleted

## 2017-05-01 DIAGNOSIS — I2510 Atherosclerotic heart disease of native coronary artery without angina pectoris: Secondary | ICD-10-CM

## 2017-05-01 DIAGNOSIS — R931 Abnormal findings on diagnostic imaging of heart and coronary circulation: Secondary | ICD-10-CM

## 2017-05-01 DIAGNOSIS — I10 Essential (primary) hypertension: Secondary | ICD-10-CM

## 2017-05-01 NOTE — Telephone Encounter (Signed)
SPOKE TO WIFE.  VERBAL FROM PATIENT TO GIVE INFORMATION.  IN AGREEMENT TO CANCEL 05/04/17 APPOINTMENT, ORDER EXERCISE NUCLEAR MYOVIEW RESCHEDULE FOLLOW UP  INFORMATION SENT TO BOTH SCHEDULERS.

## 2017-05-01 NOTE — Telephone Encounter (Signed)
F/u message  Returning call. Please call back to discuss

## 2017-05-01 NOTE — Telephone Encounter (Signed)
LEFT MESSAGE TO CALL BACK-  REGARDING RESULT OF TES- ? SCHEDULE MYOVIEW NOW AND RESCHEDULE OR WAIT UNTIL APPOINTMENT TO DISCUSS ON 04/2717

## 2017-05-01 NOTE — Telephone Encounter (Signed)
-----   Message from Leonie Man, MD sent at 04/08/2017  4:58 PM EDT ----- Coronary Calcium Score = 1043.   Recommendation from CT reader (based upon extent of calcium is to check a Nuclear Stress Test to assess for significant blockages & not a CT Angiogram (too hard to read with lots of calcium).  New Left Lower Lung Lobe opacities -- will need to reassess in 3 months with non-contrast CT.  Glenetta Hew, MD  Pls forward to PCP: Redmond School, MD  Peyton Bottoms, MD

## 2017-05-04 ENCOUNTER — Ambulatory Visit: Payer: Medicare Other | Admitting: Cardiology

## 2017-05-06 ENCOUNTER — Telehealth (HOSPITAL_COMMUNITY): Payer: Self-pay

## 2017-05-06 NOTE — Telephone Encounter (Signed)
Encounter complete. 

## 2017-05-08 ENCOUNTER — Ambulatory Visit (HOSPITAL_COMMUNITY)
Admission: RE | Admit: 2017-05-08 | Discharge: 2017-05-08 | Disposition: A | Discharge status: Home / Self Care | Payer: Medicare Other | Source: Ambulatory Visit | Attending: Internal Medicine | Admitting: Internal Medicine

## 2017-05-08 DIAGNOSIS — Z8249 Family history of ischemic heart disease and other diseases of the circulatory system: Secondary | ICD-10-CM | POA: Diagnosis not present

## 2017-05-08 DIAGNOSIS — I10 Essential (primary) hypertension: Secondary | ICD-10-CM | POA: Insufficient documentation

## 2017-05-08 DIAGNOSIS — Z85118 Personal history of other malignant neoplasm of bronchus and lung: Secondary | ICD-10-CM | POA: Diagnosis not present

## 2017-05-08 DIAGNOSIS — Z87891 Personal history of nicotine dependence: Secondary | ICD-10-CM | POA: Insufficient documentation

## 2017-05-08 DIAGNOSIS — J449 Chronic obstructive pulmonary disease, unspecified: Secondary | ICD-10-CM | POA: Diagnosis not present

## 2017-05-08 DIAGNOSIS — R931 Abnormal findings on diagnostic imaging of heart and coronary circulation: Secondary | ICD-10-CM | POA: Diagnosis not present

## 2017-05-08 DIAGNOSIS — I2510 Atherosclerotic heart disease of native coronary artery without angina pectoris: Secondary | ICD-10-CM | POA: Diagnosis not present

## 2017-05-08 LAB — MYOCARDIAL PERFUSION IMAGING
CHL CUP NUCLEAR SDS: 2
CHL CUP NUCLEAR SRS: 0
CHL CUP NUCLEAR SSS: 2
CSEPED: 6 min
CSEPHR: 92 %
Estimated workload: 7.7 METS
Exercise duration (sec): 42 s
LVDIAVOL: 97 mL (ref 62–150)
LVSYSVOL: 36 mL
MPHR: 150 {beats}/min
Peak HR: 139 {beats}/min
RPE: 19
Rest HR: 65 {beats}/min
TID: 1.04

## 2017-05-08 MED ORDER — TECHNETIUM TC 99M TETROFOSMIN IV KIT
10.2000 | PACK | Freq: Once | INTRAVENOUS | Status: AC | PRN
  Administered 2017-05-08: 10.2 via INTRAVENOUS
  Filled 2017-05-08: qty 11

## 2017-05-08 MED ORDER — TECHNETIUM TC 99M TETROFOSMIN IV KIT
32.1000 | PACK | Freq: Once | INTRAVENOUS | Status: AC | PRN
  Administered 2017-05-08: 32.1 via INTRAVENOUS
  Filled 2017-05-08: qty 33

## 2017-05-09 NOTE — Progress Notes (Signed)
Stress Test looked good!! No sign of significant Heart Artery Disease.  Pump function is normal. OK exercise tolerance.  Hypertension noted with exercise.  Good news!!.  Leonie Man, MD  Pls forward to PCP: Redmond School, MD

## 2017-05-12 ENCOUNTER — Ambulatory Visit (INDEPENDENT_AMBULATORY_CARE_PROVIDER_SITE_OTHER): Payer: Medicare Other | Admitting: Cardiology

## 2017-05-12 VITALS — BP 119/67 | HR 67 | Ht 66.0 in | Wt 154.0 lb

## 2017-05-12 DIAGNOSIS — I2510 Atherosclerotic heart disease of native coronary artery without angina pectoris: Secondary | ICD-10-CM | POA: Diagnosis not present

## 2017-05-12 DIAGNOSIS — E785 Hyperlipidemia, unspecified: Secondary | ICD-10-CM

## 2017-05-12 DIAGNOSIS — I10 Essential (primary) hypertension: Secondary | ICD-10-CM | POA: Diagnosis not present

## 2017-05-12 NOTE — Progress Notes (Signed)
PCP: Maurice School, MD  Clinic Note: Chief Complaint  Patient presents with  . Follow-up    pt denied chest pain   . Coronary Artery Disease    Negative Myoview    HPI: Maurice Orozco is a 71 y.o. male with a PMH below who presents today for follow-up evaluation of coronary calcificationon CT at the request of Maurice School, MD.  Maurice Orozco [facility] was initially seen in consultation on 03/23/2017 for evaluation of 3V coronary Calcification noted on Screening CT Chest for h/o Lung Cancer.: Coronary Calcium Score 1043 -- Myoview stress test ordered.   Recent Hospitalizations: n/a  Studies Personally Reviewed - (if available, images/films reviewed: From Epic Chart or Care Everywhere)  Coronary Calcium Score 04/05/2017: 1043. Noted especially in RCA. Dense calcification, recommend Myoview over Cor CTA.  Non-cardiac:  2 small peripheral opacities in L L L- new since June 2018. ? Small foci of infection or inflammation - with Lung Ca hx - consider recheck in 3 month.  Myoview 05/08/2017: EF 60%. Hypertensive response to exercise. (6:21 min, 7.7 METs) No EKG changes. LOW RISK  Interval History: Maurice Orozco returns today with no cardiac complaints. He has some baseline dyspnea with exertion from COPD, but no resting or exertional chest discomfort & DOE is stable. Back pain limits his activity as much as dyspnea (as was the case on his Myoview).  He otherwise denies any anginal type chest pain or pressure with rest or exertion. No PND, orthopnea or edema.  No palpitations (just rare skipping beats), lightheadedness, dizziness, weakness or syncope/near syncope. No TIA/amaurosis fugax symptoms. No melena, hematochezia, hematuria, or epstaxis. No claudication.  ROS:  Review of Systems  Constitutional: Negative for malaise/fatigue.  Respiratory: Positive for cough and shortness of breath. Negative for wheezing.        COPD - stable Sx  Gastrointestinal: Positive for heartburn.  Musculoskeletal:  Positive for back pain.   I have reviewed and (if needed) personally updated the patient's problem list, medications, allergies, past medical and surgical history, social and family history.   Past Medical History:  Diagnosis Date  . Adenocarcinoma of lung ([place]) 05/06/2011   rt lung/dx 2011/surg only  . Allergy   . Anxiety   . Arthritis    finger  . Back fracture    DUE TO AA IN 1970  . Chronic back pain   . 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 Essential hypertension 05/06/2011  . GERD (gastroesophageal reflux disease)   . High cholesterol   . High coronary artery calcium score Maurice Orozco 2018   Agaston Score 1043. dense RCA calcification --Non-ischemic Myoview  . History of radiation therapy 07/04/14, 07/06/14, 07/10/14   LLL lung nodule/54 Gy/3 fx- SBRT  . Hypercholesterolemia 05/06/2011  . Pneumonia due to Streptococcus    HX. POST OPERATIVELY  . Stress fx pelvis    DUE TO AA IN 1970    Past Surgical History:  Procedure Laterality Date  . BACK SURGERY    . BACK SURGERY  2011   metal plate l-4,L5  . Coroanry Calcium Score  03/2017   1043. Noted especially in RCA. Dense calcification, recommend Myoview over Cor CTA.  Marland Kitchen LUNG LOBECTOMY  2012   RT UPPER LOBE  . NM MYOVIEW LTD  04/2017   EF 60%. Hypertensive response to exercise. (6:21 min, 7.7 METs) No EKG changes. LOW RISK    Current Meds  Medication Sig  . albuterol (PROVENTIL HFA;VENTOLIN HFA) 108 (90 BASE)  MCG/ACT inhaler Inhale 2 puffs into the lungs every 6 (six) hours as needed for wheezing or shortness of breath.   . ALPRAZolam (XANAX) 0.5 MG tablet Take 0.5 mg by mouth at bedtime as needed for sleep.   . Cimetidine (TAGAMET PO) Take 1 tablet by mouth as needed (stomach). Taking about 8 daily  . fluticasone (FLONASE) 50 MCG/ACT nasal spray Place 2 sprays into the nose as needed for allergies.   . hydrochlorothiazide 25 MG tablet Take 25 mg by mouth daily. TAKES 1/2 TABLET  DAILY   . HYDROcodone-acetaminophen (NORCO) 10-325 MG per tablet Take 1 tablet by mouth every 6 (six) hours as needed. TAKES 1 - 2 EVERY 4 HOURS FOR BACK PAIN   . pantoprazole (PROTONIX) 40 MG tablet Take 40 mg by mouth daily.  . simvastatin (ZOCOR) 40 MG tablet Take 40 mg by mouth daily at 6 PM.     Allergies  Allergen Reactions  . Fentanyl Other (See Comments)    CAUSED HALLUCINATIONS/05-03-13 pt reports it was oral fentanyl that caused hallucinations  . Gabapentin Other (See Comments)    Bones throbbed, headaches, weakness  . Aspirin Nausea Only    Social History   Social History  . Marital status: Married    Spouse name: N/A  . Number of children: 3  . Years of education: N/A   Social History Main Topics  . Smoking status: Former Smoker    Types: Cigarettes    Quit date: 09/08/2010  . Smokeless tobacco: Current User    Types: Chew     Comment: still chews tobacco  . Alcohol use No  . Drug use: No  . Sexual activity: Not Currently   Other Topics Concern  . None   Social History Narrative  . None    family history includes Cancer in his maternal uncle, maternal uncle, maternal uncle, and maternal uncle; Heart attack in his brother; Hypertension in his mother; Kidney disease in his father.  Wt Readings from Last 3 Encounters:  05/12/17 154 lb (69.9 kg)  05/08/17 152 lb (68.9 kg)  03/23/17 152 lb 9.6 oz (69.2 kg)    PHYSICAL EXAM BP 119/67   Pulse 67   Ht 5\' 6"  (1.676 m)   Wt 154 lb (69.9 kg)   BMI 24.86 kg/m  Physical Exam  Constitutional: He is oriented to person, place, and time. He appears well-developed and well-nourished. No distress.  Well groomed.  HENT:  Head: Normocephalic and atraumatic.  Neck: No hepatojugular reflux and no JVD present. Carotid bruit is not present.  Pulmonary/Chest: Effort normal and breath sounds normal. No respiratory distress. He has no wheezes. He has no rales.  Distant breath sounds with mild interstitial sounds.      Abdominal: Soft. Bowel sounds are normal. He exhibits no distension. There is no tenderness. There is no rebound.  Neurological: He is alert and oriented to person, place, and time.  Skin: Skin is warm and dry. No rash noted. No erythema.  Psychiatric: He has a normal mood and affect. His behavior is normal. Judgment and thought content normal.     Adult ECG Report n/a  Other studies Reviewed: Additional studies/ records that were reviewed today include:  Recent Labs:   Lab Results  Component Value Date   CREATININE 0.83 03/13/2017   BUN 13 03/13/2017   NA 135 03/13/2017   K 3.5 03/13/2017   CL 97 (L) 03/13/2017   CO2 29 03/13/2017  No results found for: CHOL, HDL, LDLCALC,  LDLDIRECT, TRIG, CHOLHDL    ASSESSMENT / PLAN: Problem List Items Addressed This Visit    Coronary artery calcification seen on CAT scan - Primary (Chronic)    Quite high Coronary Calcium Score with significant dense calcification (most notably in RCA). No angina Symptoms & Non-ischemic Myoview would argue diffuse multivessel Nonocclusive CAD. Given the extent of disease noted - will need to be more aggressive with CRF modification.  - unable to take ASA due to GI issues  - but if he has CP, needs to take anyway - consider Plavix.  Increase level of Lipid control - goal LDL now < 70. BP is controlled & trying to avoid Beta Blocker with COPD, but would consider bisoprolol if BP were to increase.        Relevant Orders   Lipid panel   Dyslipidemia, goal LDL below 70 (Chronic)    Now on 40 mg Simvastatin - due for lab check. May need to increase Rx intensity - consider changing to more potent statin or adding Zetia.      Relevant Orders   Lipid panel   Essential hypertension (Chronic)    On HCTZ - will need to consider changing to Bisoprolol in f/u.         Current medicines are reviewed at length with the patient today. (+/- concerns) n/a The following changes have been made: n/a  Patient  Instructions  LABS--LIPID  DO NOT EAT OR DRINK THE MORNING OF THE TEST    Your physician wants you to follow-up 12 MONTHS WITH DR HARDING. You will receive a reminder letter in the mail two months in advance. If you don't receive a letter, please call our office to schedule the follow-up appointment.   If you need a refill on your cardiac medications before your next appointment, please call your pharmacy.    Studies Ordered:   Orders Placed This Encounter  Procedures  . Lipid panel      Glenetta Hew, M.D., M.S. Interventional Cardiologist   Pager # [phone] Phone # [phone] [address] [place], [place] [address]

## 2017-05-12 NOTE — Patient Instructions (Signed)
LABS--LIPID  DO NOT EAT OR DRINK THE MORNING OF THE TEST    Your physician wants you to follow-up 12 MONTHS WITH DR HARDING. You will receive a reminder letter in the mail two months in advance. If you don't receive a letter, please call our office to schedule the follow-up appointment.   If you need a refill on your cardiac medications before your next appointment, please call your pharmacy.

## 2017-05-14 ENCOUNTER — Encounter: Payer: Self-pay | Admitting: Cardiology

## 2017-05-14 NOTE — Assessment & Plan Note (Signed)
Now on 40 mg Simvastatin - due for lab check. May need to increase Rx intensity - consider changing to more potent statin or adding Zetia.

## 2017-05-14 NOTE — Assessment & Plan Note (Signed)
On HCTZ - will need to consider changing to Bisoprolol in f/u.

## 2017-05-14 NOTE — Assessment & Plan Note (Addendum)
Quite high Coronary Calcium Score with significant dense calcification (most notably in RCA). No angina Symptoms & Non-ischemic Myoview would argue diffuse multivessel Nonocclusive CAD. Given the extent of disease noted - will need to be more aggressive with CRF modification.  - unable to take ASA due to GI issues  - but if he has CP, needs to take anyway - consider Plavix.  Increase level of Lipid control - goal LDL now < 70. BP is controlled & trying to avoid Beta Blocker with COPD, but would consider bisoprolol if BP were to increase.

## 2017-05-20 DIAGNOSIS — M48061 Spinal stenosis, lumbar region without neurogenic claudication: Secondary | ICD-10-CM | POA: Diagnosis not present

## 2017-05-25 DIAGNOSIS — I10 Essential (primary) hypertension: Secondary | ICD-10-CM | POA: Diagnosis not present

## 2017-05-25 DIAGNOSIS — J984 Other disorders of lung: Secondary | ICD-10-CM | POA: Diagnosis not present

## 2017-05-25 DIAGNOSIS — Z6825 Body mass index (BMI) 25.0-25.9, adult: Secondary | ICD-10-CM | POA: Diagnosis not present

## 2017-05-25 DIAGNOSIS — M5136 Other intervertebral disc degeneration, lumbar region: Secondary | ICD-10-CM | POA: Diagnosis not present

## 2017-05-25 DIAGNOSIS — J449 Chronic obstructive pulmonary disease, unspecified: Secondary | ICD-10-CM | POA: Diagnosis not present

## 2017-05-25 DIAGNOSIS — M1990 Unspecified osteoarthritis, unspecified site: Secondary | ICD-10-CM | POA: Diagnosis not present

## 2017-05-25 DIAGNOSIS — K219 Gastro-esophageal reflux disease without esophagitis: Secondary | ICD-10-CM | POA: Diagnosis not present

## 2017-05-25 DIAGNOSIS — M545 Low back pain: Secondary | ICD-10-CM | POA: Diagnosis not present

## 2017-05-26 DIAGNOSIS — E785 Hyperlipidemia, unspecified: Secondary | ICD-10-CM | POA: Diagnosis not present

## 2017-05-26 DIAGNOSIS — Z125 Encounter for screening for malignant neoplasm of prostate: Secondary | ICD-10-CM | POA: Diagnosis not present

## 2017-05-26 DIAGNOSIS — Z79891 Long term (current) use of opiate analgesic: Secondary | ICD-10-CM | POA: Diagnosis not present

## 2017-05-27 DIAGNOSIS — E748 Other specified disorders of carbohydrate metabolism: Secondary | ICD-10-CM | POA: Diagnosis not present

## 2017-05-27 DIAGNOSIS — R7309 Other abnormal glucose: Secondary | ICD-10-CM | POA: Diagnosis not present

## 2017-06-04 DIAGNOSIS — Z9889 Other specified postprocedural states: Secondary | ICD-10-CM | POA: Diagnosis not present

## 2017-06-04 DIAGNOSIS — M4726 Other spondylosis with radiculopathy, lumbar region: Secondary | ICD-10-CM | POA: Diagnosis not present

## 2017-06-04 DIAGNOSIS — M5126 Other intervertebral disc displacement, lumbar region: Secondary | ICD-10-CM | POA: Diagnosis not present

## 2017-06-04 DIAGNOSIS — Z87891 Personal history of nicotine dependence: Secondary | ICD-10-CM | POA: Diagnosis not present

## 2017-06-04 DIAGNOSIS — M4312 Spondylolisthesis, cervical region: Secondary | ICD-10-CM | POA: Diagnosis not present

## 2017-06-04 DIAGNOSIS — M4156 Other secondary scoliosis, lumbar region: Secondary | ICD-10-CM | POA: Diagnosis not present

## 2017-06-04 DIAGNOSIS — Z888 Allergy status to other drugs, medicaments and biological substances status: Secondary | ICD-10-CM | POA: Diagnosis not present

## 2017-06-04 DIAGNOSIS — Z886 Allergy status to analgesic agent status: Secondary | ICD-10-CM | POA: Diagnosis not present

## 2017-06-04 DIAGNOSIS — E785 Hyperlipidemia, unspecified: Secondary | ICD-10-CM | POA: Diagnosis not present

## 2017-06-04 DIAGNOSIS — M4316 Spondylolisthesis, lumbar region: Secondary | ICD-10-CM | POA: Diagnosis not present

## 2017-06-04 DIAGNOSIS — M4150 Other secondary scoliosis, site unspecified: Secondary | ICD-10-CM | POA: Diagnosis not present

## 2017-06-04 DIAGNOSIS — Z981 Arthrodesis status: Secondary | ICD-10-CM | POA: Diagnosis not present

## 2017-06-04 DIAGNOSIS — Z79899 Other long term (current) drug therapy: Secondary | ICD-10-CM | POA: Diagnosis not present

## 2017-06-04 DIAGNOSIS — I10 Essential (primary) hypertension: Secondary | ICD-10-CM | POA: Diagnosis not present

## 2017-06-04 DIAGNOSIS — M4184 Other forms of scoliosis, thoracic region: Secondary | ICD-10-CM | POA: Diagnosis not present

## 2017-06-04 DIAGNOSIS — M47816 Spondylosis without myelopathy or radiculopathy, lumbar region: Secondary | ICD-10-CM | POA: Diagnosis not present

## 2017-06-19 DIAGNOSIS — J449 Chronic obstructive pulmonary disease, unspecified: Secondary | ICD-10-CM | POA: Diagnosis not present

## 2017-06-19 DIAGNOSIS — M4185 Other forms of scoliosis, thoracolumbar region: Secondary | ICD-10-CM | POA: Diagnosis not present

## 2017-06-19 DIAGNOSIS — Z87891 Personal history of nicotine dependence: Secondary | ICD-10-CM | POA: Diagnosis not present

## 2017-06-19 DIAGNOSIS — M5116 Intervertebral disc disorders with radiculopathy, lumbar region: Secondary | ICD-10-CM | POA: Diagnosis not present

## 2017-06-19 DIAGNOSIS — K219 Gastro-esophageal reflux disease without esophagitis: Secondary | ICD-10-CM | POA: Diagnosis not present

## 2017-06-19 DIAGNOSIS — F419 Anxiety disorder, unspecified: Secondary | ICD-10-CM | POA: Diagnosis not present

## 2017-06-19 DIAGNOSIS — M810 Age-related osteoporosis without current pathological fracture: Secondary | ICD-10-CM | POA: Diagnosis not present

## 2017-06-19 DIAGNOSIS — Z85118 Personal history of other malignant neoplasm of bronchus and lung: Secondary | ICD-10-CM | POA: Diagnosis not present

## 2017-06-19 DIAGNOSIS — R300 Dysuria: Secondary | ICD-10-CM | POA: Diagnosis not present

## 2017-06-19 DIAGNOSIS — M8589 Other specified disorders of bone density and structure, multiple sites: Secondary | ICD-10-CM | POA: Diagnosis not present

## 2017-06-19 DIAGNOSIS — E7800 Pure hypercholesterolemia, unspecified: Secondary | ICD-10-CM | POA: Diagnosis not present

## 2017-06-19 DIAGNOSIS — R05 Cough: Secondary | ICD-10-CM | POA: Diagnosis not present

## 2017-06-19 DIAGNOSIS — R82998 Other abnormal findings in urine: Secondary | ICD-10-CM | POA: Diagnosis not present

## 2017-06-19 DIAGNOSIS — D729 Disorder of white blood cells, unspecified: Secondary | ICD-10-CM | POA: Diagnosis not present

## 2017-06-19 DIAGNOSIS — M4726 Other spondylosis with radiculopathy, lumbar region: Secondary | ICD-10-CM | POA: Diagnosis not present

## 2017-06-19 DIAGNOSIS — R918 Other nonspecific abnormal finding of lung field: Secondary | ICD-10-CM | POA: Diagnosis not present

## 2017-06-19 DIAGNOSIS — I10 Essential (primary) hypertension: Secondary | ICD-10-CM | POA: Diagnosis not present

## 2017-06-19 DIAGNOSIS — N390 Urinary tract infection, site not specified: Secondary | ICD-10-CM | POA: Diagnosis not present

## 2017-06-20 DIAGNOSIS — I10 Essential (primary) hypertension: Secondary | ICD-10-CM | POA: Diagnosis not present

## 2017-06-29 DIAGNOSIS — D72825 Bandemia: Secondary | ICD-10-CM | POA: Diagnosis not present

## 2017-06-29 DIAGNOSIS — Z09 Encounter for follow-up examination after completed treatment for conditions other than malignant neoplasm: Secondary | ICD-10-CM | POA: Diagnosis not present

## 2017-06-29 DIAGNOSIS — N3000 Acute cystitis without hematuria: Secondary | ICD-10-CM | POA: Diagnosis not present

## 2017-06-29 DIAGNOSIS — R6883 Chills (without fever): Secondary | ICD-10-CM | POA: Diagnosis not present

## 2017-07-03 DIAGNOSIS — D72825 Bandemia: Secondary | ICD-10-CM | POA: Diagnosis not present

## 2017-07-03 DIAGNOSIS — D649 Anemia, unspecified: Secondary | ICD-10-CM | POA: Diagnosis not present

## 2017-07-04 DIAGNOSIS — Z981 Arthrodesis status: Secondary | ICD-10-CM | POA: Diagnosis not present

## 2017-07-04 DIAGNOSIS — I10 Essential (primary) hypertension: Secondary | ICD-10-CM | POA: Diagnosis present

## 2017-07-04 DIAGNOSIS — K219 Gastro-esophageal reflux disease without esophagitis: Secondary | ICD-10-CM | POA: Diagnosis present

## 2017-07-04 DIAGNOSIS — M4185 Other forms of scoliosis, thoracolumbar region: Secondary | ICD-10-CM | POA: Diagnosis not present

## 2017-07-04 DIAGNOSIS — M4726 Other spondylosis with radiculopathy, lumbar region: Secondary | ICD-10-CM | POA: Diagnosis not present

## 2017-07-04 DIAGNOSIS — M4316 Spondylolisthesis, lumbar region: Secondary | ICD-10-CM | POA: Diagnosis not present

## 2017-07-04 DIAGNOSIS — F419 Anxiety disorder, unspecified: Secondary | ICD-10-CM | POA: Diagnosis present

## 2017-07-04 DIAGNOSIS — M5136 Other intervertebral disc degeneration, lumbar region: Secondary | ICD-10-CM | POA: Diagnosis not present

## 2017-07-04 DIAGNOSIS — M5116 Intervertebral disc disorders with radiculopathy, lumbar region: Secondary | ICD-10-CM | POA: Diagnosis present

## 2017-07-04 DIAGNOSIS — E7800 Pure hypercholesterolemia, unspecified: Secondary | ICD-10-CM | POA: Diagnosis present

## 2017-07-04 DIAGNOSIS — Z87891 Personal history of nicotine dependence: Secondary | ICD-10-CM | POA: Diagnosis not present

## 2017-07-04 DIAGNOSIS — Z85118 Personal history of other malignant neoplasm of bronchus and lung: Secondary | ICD-10-CM | POA: Diagnosis not present

## 2017-07-04 DIAGNOSIS — J449 Chronic obstructive pulmonary disease, unspecified: Secondary | ICD-10-CM | POA: Diagnosis present

## 2017-07-04 HISTORY — PX: BACK SURGERY: SHX140

## 2017-08-05 DIAGNOSIS — M160 Bilateral primary osteoarthritis of hip: Secondary | ICD-10-CM | POA: Diagnosis not present

## 2017-08-05 DIAGNOSIS — I708 Atherosclerosis of other arteries: Secondary | ICD-10-CM | POA: Diagnosis not present

## 2017-08-05 DIAGNOSIS — M4316 Spondylolisthesis, lumbar region: Secondary | ICD-10-CM | POA: Diagnosis not present

## 2017-08-05 DIAGNOSIS — M5136 Other intervertebral disc degeneration, lumbar region: Secondary | ICD-10-CM | POA: Diagnosis not present

## 2017-08-05 DIAGNOSIS — Z981 Arthrodesis status: Secondary | ICD-10-CM | POA: Diagnosis not present

## 2017-08-05 DIAGNOSIS — M438X6 Other specified deforming dorsopathies, lumbar region: Secondary | ICD-10-CM | POA: Diagnosis not present

## 2017-08-05 DIAGNOSIS — M4186 Other forms of scoliosis, lumbar region: Secondary | ICD-10-CM | POA: Diagnosis not present

## 2017-08-05 DIAGNOSIS — Z5189 Encounter for other specified aftercare: Secondary | ICD-10-CM | POA: Diagnosis not present

## 2017-08-05 DIAGNOSIS — M8580 Other specified disorders of bone density and structure, unspecified site: Secondary | ICD-10-CM | POA: Diagnosis not present

## 2017-08-05 DIAGNOSIS — M533 Sacrococcygeal disorders, not elsewhere classified: Secondary | ICD-10-CM | POA: Diagnosis not present

## 2017-08-05 DIAGNOSIS — M8588 Other specified disorders of bone density and structure, other site: Secondary | ICD-10-CM | POA: Diagnosis not present

## 2017-08-10 ENCOUNTER — Other Ambulatory Visit: Payer: Self-pay | Admitting: Radiation Oncology

## 2017-08-10 DIAGNOSIS — C3490 Malignant neoplasm of unspecified part of unspecified bronchus or lung: Secondary | ICD-10-CM

## 2017-08-11 ENCOUNTER — Telehealth: Payer: Self-pay | Admitting: *Deleted

## 2017-08-11 NOTE — Telephone Encounter (Signed)
CALLED PATIENT TO INFORM OF CT FOR 08-13-17 @ [facility], PATIENT TO ARRIVE @ 11:45 AM, NO RESTRICTIONS TO TEST, PT. TO GET RESULTS FROM DR. SQUIRE ON 08-14-17, LVM FOR A RETURN CALL

## 2017-08-13 ENCOUNTER — Ambulatory Visit (HOSPITAL_COMMUNITY): Payer: Medicare Other

## 2017-08-14 ENCOUNTER — Inpatient Hospital Stay: Admission: RE | Admit: 2017-08-14 | Payer: Self-pay | Source: Ambulatory Visit | Admitting: Radiation Oncology

## 2017-08-19 DIAGNOSIS — I2510 Atherosclerotic heart disease of native coronary artery without angina pectoris: Secondary | ICD-10-CM | POA: Diagnosis not present

## 2017-08-19 DIAGNOSIS — Z1389 Encounter for screening for other disorder: Secondary | ICD-10-CM | POA: Diagnosis not present

## 2017-08-19 DIAGNOSIS — G894 Chronic pain syndrome: Secondary | ICD-10-CM | POA: Diagnosis not present

## 2017-08-19 DIAGNOSIS — F419 Anxiety disorder, unspecified: Secondary | ICD-10-CM | POA: Diagnosis not present

## 2017-08-19 DIAGNOSIS — Z6824 Body mass index (BMI) 24.0-24.9, adult: Secondary | ICD-10-CM | POA: Diagnosis not present

## 2017-08-19 DIAGNOSIS — D1721 Benign lipomatous neoplasm of skin and subcutaneous tissue of right arm: Secondary | ICD-10-CM | POA: Diagnosis not present

## 2017-08-19 DIAGNOSIS — I10 Essential (primary) hypertension: Secondary | ICD-10-CM | POA: Diagnosis not present

## 2017-09-17 ENCOUNTER — Ambulatory Visit: Payer: Medicare Other | Admitting: General Surgery

## 2017-09-24 ENCOUNTER — Ambulatory Visit (INDEPENDENT_AMBULATORY_CARE_PROVIDER_SITE_OTHER): Payer: Medicare Other | Admitting: General Surgery

## 2017-09-24 ENCOUNTER — Ambulatory Visit (HOSPITAL_COMMUNITY)
Admission: RE | Admit: 2017-09-24 | Discharge: 2017-09-24 | Disposition: A | Discharge status: Home / Self Care | Payer: Medicare Other | Source: Ambulatory Visit | Attending: Radiation Oncology | Admitting: Radiation Oncology

## 2017-09-24 ENCOUNTER — Encounter: Payer: Self-pay | Admitting: General Surgery

## 2017-09-24 VITALS — BP 152/69 | HR 66 | Temp 98.4°F | Resp 18 | Ht 66.0 in | Wt 159.0 lb

## 2017-09-24 DIAGNOSIS — Z902 Acquired absence of lung [part of]: Secondary | ICD-10-CM | POA: Diagnosis not present

## 2017-09-24 DIAGNOSIS — D174 Benign lipomatous neoplasm of intrathoracic organs: Secondary | ICD-10-CM | POA: Diagnosis not present

## 2017-09-24 DIAGNOSIS — Y842 Radiological procedure and radiotherapy as the cause of abnormal reaction of the patient, or of later complication, without mention of misadventure at the time of the procedure: Secondary | ICD-10-CM | POA: Insufficient documentation

## 2017-09-24 DIAGNOSIS — K449 Diaphragmatic hernia without obstruction or gangrene: Secondary | ICD-10-CM | POA: Insufficient documentation

## 2017-09-24 DIAGNOSIS — Z923 Personal history of irradiation: Secondary | ICD-10-CM | POA: Diagnosis not present

## 2017-09-24 DIAGNOSIS — I700 Atherosclerosis of aorta: Secondary | ICD-10-CM | POA: Diagnosis not present

## 2017-09-24 DIAGNOSIS — C3490 Malignant neoplasm of unspecified part of unspecified bronchus or lung: Secondary | ICD-10-CM | POA: Insufficient documentation

## 2017-09-24 DIAGNOSIS — J439 Emphysema, unspecified: Secondary | ICD-10-CM | POA: Insufficient documentation

## 2017-09-24 DIAGNOSIS — D171 Benign lipomatous neoplasm of skin and subcutaneous tissue of trunk: Secondary | ICD-10-CM | POA: Diagnosis not present

## 2017-09-24 NOTE — Progress Notes (Signed)
[facility] History and Physical  Reason for Referral:Lipoma on the right shoulder   Referring Physician: Dr. Gerarda Fraction    Chief Complaint    Lipoma      Maurice Orozco is a 72 y.o. male.  HPI: Maurice Orozco is a very pleasant gentleman with a history of Non-small cell lung cancer s/p right thoracotomy, who also has chronic back pain and underwent a back surgery 06/2017.  He says he has been doing better, but he has noticed a lump on his right shoulder blade for over 10 years. He thinks that the mass has started to get larger over the last few years, and feels like it limits his range of motion in his right shoulder, and has some right shoulder pain.  He is worried that this is cancer, and Dr. Gerarda Fraction sent him over to evaluate given the size and possibility for removal. The patient denies any other lumps or bumps, and has never had any redness or drainage from the area.  He was last seen by Cardiology 05/2017 due to calcifications to his coronary vessels seen on CT and underwent evaluation with a stress test that was normal.  He has risk modifications with medications, and reports no cardiac symptoms, no chest pain or shortness of breath.   Past Medical History:  Diagnosis Date  . Adenocarcinoma of lung ([place]) 05/06/2011   rt lung/dx 2011/surg only  . Allergy   . Anxiety   . Arthritis    finger  . Back fracture    DUE TO AA IN 1970  . Chronic back pain   . 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 Essential hypertension 05/06/2011  . GERD (gastroesophageal reflux disease)   . High cholesterol   . High coronary artery calcium score Julye 2018   Agaston Score 1043. dense RCA calcification --Non-ischemic Myoview  . History of radiation therapy 07/04/14, 07/06/14, 07/10/14   LLL lung nodule/54 Gy/3 fx- SBRT  . Hypercholesterolemia 05/06/2011  . Pneumonia due to Streptococcus    HX. POST OPERATIVELY  . Stress fx pelvis    DUE TO AA IN 1970     Past Surgical History:  Procedure Laterality Date  . BACK SURGERY    . BACK SURGERY  2011   metal plate l-4,L5  . Coroanry Calcium Score  03/2017   1043. Noted especially in RCA. Dense calcification, recommend Myoview over Cor CTA.  Marland Kitchen LUNG LOBECTOMY  2012   RT UPPER LOBE  . NM MYOVIEW LTD  04/2017   EF 60%. Hypertensive response to exercise. (6:21 min, 7.7 METs) No EKG changes. LOW RISK    Family History  Problem Relation Age of Onset  . Hypertension Mother   . Kidney disease Father   . Heart attack Brother   . Cancer Maternal Uncle        prostate cancer  . Cancer Maternal Uncle        prostate cancer  . Cancer Maternal Uncle        prostate cancer  . Cancer Maternal Uncle        prostate cancer    Social History   Tobacco Use  . Smoking status: Former Smoker    Types: Cigarettes    Last attempt to quit: 09/08/2010    Years since quitting: 7.0  . Smokeless tobacco: Current User    Types: Chew  . Tobacco comment: still chews tobacco  Substance Use Topics  . Alcohol use: No  . Drug  use: No    Medications: I have reviewed the patient's current medications. Allergies as of 09/24/2017      Reactions   Fentanyl Other (See Comments)   CAUSED HALLUCINATIONS/05-03-13 pt reports it was oral fentanyl that caused hallucinations   Gabapentin Other (See Comments)   Bones throbbed, headaches, weakness   Aspirin Nausea Only      Medication List        Accurate as of 09/24/17 11:59 PM. Always use your most recent med list.          albuterol 108 (90 Base) MCG/ACT inhaler Commonly known as:  PROVENTIL HFA;VENTOLIN HFA Inhale 2 puffs into the lungs every 6 (six) hours as needed for wheezing or shortness of breath.   ALPRAZolam 0.5 MG tablet Commonly known as:  XANAX Take 0.5 mg by mouth at bedtime as needed for sleep.   fluticasone 50 MCG/ACT nasal spray Commonly known as:  FLONASE Place 2 sprays into the nose as needed for allergies.   hydrochlorothiazide 25  MG tablet Commonly known as:  HYDRODIURIL Take 25 mg by mouth daily. TAKES 1/2 TABLET DAILY   HYDROcodone-acetaminophen 10-325 MG tablet Commonly known as:  NORCO Take 1 tablet by mouth every 6 (six) hours as needed. TAKES 1 - 2 EVERY 4 HOURS FOR BACK PAIN   pantoprazole 40 MG tablet Commonly known as:  PROTONIX Take 40 mg by mouth daily.   simvastatin 40 MG tablet Commonly known as:  ZOCOR Take 40 mg by mouth daily at 6 PM.   TAGAMET PO Take 1 tablet by mouth as needed (stomach). Taking about 8 daily        ROS:  A comprehensive review of systems was negative except for: Respiratory: positive for wheezing Cardiovascular: positive for hypertension Gastrointestinal: positive for reflux symptoms and indigestion Musculoskeletal: positive for back pain, bone pain, neck pain, stiff joints and right shoulder pain  Blood pressure (!) 152/69, pulse 66, temperature 98.4 F (36.9 C), resp. rate 18, height 5\' 6"  (1.676 m), weight 159 lb (72.1 kg). Physical Exam  Constitutional: He is oriented to person, place, and time and well-developed, well-nourished, and in no distress.  HENT:  Head: Normocephalic.  Eyes: Pupils are equal, round, and reactive to light.  Neck: Normal range of motion.  Cardiovascular: Normal rate and regular rhythm.  Pulmonary/Chest: Effort normal and breath sounds normal.  Abdominal: Soft. He exhibits no distension. There is no tenderness.  Musculoskeletal: Normal range of motion. He exhibits no edema.  Some decreased range of motion on right shoulder, 3X3.5 cm mass over the superior portion of the scapula; cannot tell if mobile due to bony surroundings, no erythema or drainage  Neurological: He is alert and oriented to person, place, and time.  Skin: Skin is warm and dry.  Psychiatric: Mood, memory, affect and judgment normal.  Vitals reviewed.   Results: Reviewed imaging, and called Radiology prior to their read, the area over the right scapula is  consistent with a lipoma and does not extend down into the muscle   Ct Chest Wo Contrast  Result Date: 09/24/2017 CLINICAL DATA:  History of lung cancer.  Follow-up. EXAM: CT CHEST WITHOUT CONTRAST TECHNIQUE: Multidetector CT imaging of the chest was performed following the standard protocol without IV contrast. COMPARISON:  04/03/2017 FINDINGS: Cardiovascular: Normal heart size. Aortic atherosclerosis. Calcifications within the RCA and LAD and left circumflex coronary arteries noted. No pericardial effusion. Mediastinum/Nodes: No mediastinal or hilar adenopathy. Moderate size hiatal hernia identified. The trachea appears patent and  is midline. Lungs/Pleura: No pleural effusion. Moderate to advanced changes of emphysema. Status post right upper lobectomy. There is compensatory hyperexpansion of the right middle and lower lobes. Ill defined area of ground-glass attenuation, septal thickening and architectural distortion and nodularity in the left mid lung is again noted. This involves the left upper and lower lobes. Index nodule within this area is again noted measuring 9 mm, image 88 of series 4. Unchanged from previous exam. These findings are compatible with chronic changes of external beam radiation. Perifissural pleuroparenchymal scarring within the lingula is unchanged from previous study. 3 mm left apical nodule is unchanged. Upper Abdomen: No acute abnormality. Musculoskeletal: No aggressive lytic or sclerotic bone lesions identified. Within the right posterior chest wall there is a fat attenuating structure measuring 1.0 x 2.5 by 4.0 cm (volume = 5.2 cm^3). Favor lipoma. IMPRESSION: 1. Status post treatment related changes of prior radiation therapy in the left lung is redemonstrated, as above. No findings to suggest residual/recurrent disease on today's study. 2. Status post right upper lobectomy 3. Aortic Atherosclerosis (ICD10-I70.0) and Emphysema (ICD10-J43.9). 4. Hiatal hernia 5. Right posterior  chest wall lipoma. Electronically Signed   By: Kerby Moors M.D.   On: 09/24/2017 15:06    Assessment & Plan:  Maurice Orozco is a 72 y.o. male with a lipoma over the right scapula region that does not appear to involve any musculature.  Given that it is superficial, I am not sure if his shoulder pain is related to this lipoma or not.  He does have some decreased range of motion, and feels like the mass is making him have the discomfort. We have discussed that the mass could be causing some pulling and tension, but it could also have nothing to do with his right arm pain and decreased range of motion.  We have discussed also that this appears to be a normal benign lipoma, and has a low risk for malignancy. Given everything, the patient wants to proceed with removal. He has no cardiac symptoms, and has been seen by cardiology with a recent negative stress test 04/2017.  -OR for removal of the lipoma   All questions were answered to the satisfaction of the patient and family.  The risk and benefits of lipoma removal were discussed including but not limited to bleeding, infection, risk of recurrence, risk of not improving his pain, risk of malignancy although rare.  After careful consideration, Maurice Orozco has decided to proceed.    Virl Cagey 09/25/2017, 11:13 AM

## 2017-09-24 NOTE — Patient Instructions (Signed)
Lipoma A lipoma is a noncancerous (benign) tumor that is made up of fat cells. This is a very common type of soft-tissue growth. Lipomas are usually found under the skin (subcutaneous). They may occur in any tissue of the body that contains fat. Common areas for lipomas to appear include the back, shoulders, buttocks, and thighs. Lipomas grow slowly, and they are usually painless. Most lipomas do not cause problems and do not require treatment. What are the causes? The cause of this condition is not known. What increases the risk? This condition is more likely to develop in:  People who are 40-60 years old.  People who have a family history of lipomas.  What are the signs or symptoms? A lipoma usually appears as a small, round bump under the skin. It may feel soft or rubbery, but the firmness can vary. Most lipomas are not painful. However, a lipoma may become painful if it is located in an area where it pushes on nerves. How is this diagnosed? A lipoma can usually be diagnosed with a physical exam. You may also have tests to confirm the diagnosis and to rule out other conditions. Tests may include:  Imaging tests, such as a CT scan or MRI.  Removal of a tissue sample to be looked at under a microscope (biopsy).  How is this treated? Treatment is not needed for small lipomas that are not causing problems. If a lipoma continues to get bigger or it causes problems, removal is often the best option. Lipomas can also be removed to improve appearance. Removal of a lipoma is usually done with a surgery in which the fatty cells and the surrounding capsule are removed. Most often, a medicine that numbs the area (local anesthetic) is used for this procedure. Follow these instructions at home:  Keep all follow-up visits as directed by your health care provider. This is important. Contact a health care provider if:  Your lipoma becomes larger or hard.  Your lipoma becomes painful, red, or  increasingly swollen. These could be signs of infection or a more serious condition. This information is not intended to replace advice given to you by your health care provider. Make sure you discuss any questions you have with your health care provider. Document Released: 08/15/2002 Document Revised: 01/31/2016 Document Reviewed: 08/21/2014 Elsevier Interactive Patient Education  2018 [employer]  

## 2017-09-25 ENCOUNTER — Encounter: Payer: Self-pay | Admitting: Radiation Oncology

## 2017-09-25 ENCOUNTER — Ambulatory Visit
Admission: RE | Admit: 2017-09-25 | Discharge: 2017-09-25 | Disposition: A | Discharge status: Home / Self Care | Payer: Medicare Other | Source: Ambulatory Visit | Attending: Radiation Oncology | Admitting: Radiation Oncology

## 2017-09-25 ENCOUNTER — Encounter: Payer: Self-pay | Admitting: General Surgery

## 2017-09-25 VITALS — BP 139/79 | HR 75 | Temp 98.0°F | Ht 66.0 in | Wt 159.0 lb

## 2017-09-25 DIAGNOSIS — J439 Emphysema, unspecified: Secondary | ICD-10-CM | POA: Insufficient documentation

## 2017-09-25 DIAGNOSIS — Z79899 Other long term (current) drug therapy: Secondary | ICD-10-CM | POA: Insufficient documentation

## 2017-09-25 DIAGNOSIS — D171 Benign lipomatous neoplasm of skin and subcutaneous tissue of trunk: Secondary | ICD-10-CM | POA: Insufficient documentation

## 2017-09-25 DIAGNOSIS — Z902 Acquired absence of lung [part of]: Secondary | ICD-10-CM | POA: Insufficient documentation

## 2017-09-25 DIAGNOSIS — R918 Other nonspecific abnormal finding of lung field: Secondary | ICD-10-CM

## 2017-09-25 DIAGNOSIS — Z85118 Personal history of other malignant neoplasm of bronchus and lung: Secondary | ICD-10-CM | POA: Insufficient documentation

## 2017-09-25 DIAGNOSIS — Z923 Personal history of irradiation: Secondary | ICD-10-CM | POA: Diagnosis not present

## 2017-09-25 DIAGNOSIS — Z08 Encounter for follow-up examination after completed treatment for malignant neoplasm: Secondary | ICD-10-CM | POA: Insufficient documentation

## 2017-09-25 DIAGNOSIS — C3432 Malignant neoplasm of lower lobe, left bronchus or lung: Secondary | ICD-10-CM

## 2017-09-25 DIAGNOSIS — F17220 Nicotine dependence, chewing tobacco, uncomplicated: Secondary | ICD-10-CM | POA: Diagnosis not present

## 2017-09-25 NOTE — H&P (Signed)
[facility] History and Physical  Reason for Referral:Lipoma on the right shoulder   Referring Physician: Dr. Gerarda Fraction    Chief Complaint    Lipoma      Maurice Orozco is a 72 y.o. male.  HPI: [name] is a very pleasant gentleman with a history of Non-small cell lung cancer s/p right thoracotomy, who also has chronic back pain and underwent a back surgery 06/2017.  He says he has been doing better, but he has noticed a lump on his right shoulder blade for over 10 years. He thinks that the mass has started to get larger over the last few years, and feels like it limits his range of motion in his right shoulder, and has some right shoulder pain.  He is worried that this is cancer, and Dr. Gerarda Fraction sent him over to evaluate given the size and possibility for removal. The patient denies any other lumps or bumps, and has never had any redness or drainage from the area.  He was last seen by Cardiology 05/2017 due to calcifications to his coronary vessels seen on CT and underwent evaluation with a stress test that was normal.  He has risk modifications with medications, and reports no cardiac symptoms, no chest pain or shortness of breath.   Past Medical History:  Diagnosis Date  . Adenocarcinoma of lung ([place]) 05/06/2011   rt lung/dx 2011/surg only  . Allergy   . Anxiety   . Arthritis    finger  . Back fracture    DUE TO AA IN 1970  . Chronic back pain   . 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 Essential hypertension 05/06/2011  . GERD (gastroesophageal reflux disease)   . High cholesterol   . High coronary artery calcium score Julye 2018   Agaston Score 1043. dense RCA calcification --Non-ischemic Myoview  . History of radiation therapy 07/04/14, 07/06/14, 07/10/14   LLL lung nodule/54 Gy/3 fx- SBRT  . Hypercholesterolemia 05/06/2011  . Pneumonia due to Streptococcus    HX. POST OPERATIVELY  . Stress fx pelvis    DUE TO AA IN 1970     Past Surgical History:  Procedure Laterality Date  . BACK SURGERY    . BACK SURGERY  2011   metal plate l-4,L5  . Coroanry Calcium Score  03/2017   1043. Noted especially in RCA. Dense calcification, recommend Myoview over Cor CTA.  Marland Kitchen LUNG LOBECTOMY  2012   RT UPPER LOBE  . NM MYOVIEW LTD  04/2017   EF 60%. Hypertensive response to exercise. (6:21 min, 7.7 METs) No EKG changes. LOW RISK    Family History  Problem Relation Age of Onset  . Hypertension Mother   . Kidney disease Father   . Heart attack Brother   . Cancer Maternal Uncle        prostate cancer  . Cancer Maternal Uncle        prostate cancer  . Cancer Maternal Uncle        prostate cancer  . Cancer Maternal Uncle        prostate cancer    Social History   Tobacco Use  . Smoking status: Former Smoker    Types: Cigarettes    Last attempt to quit: 09/08/2010    Years since quitting: 7.0  . Smokeless tobacco: Current User    Types: Chew  . Tobacco comment: still chews tobacco  Substance Use Topics  . Alcohol use: No  . Drug  use: No    Medications: I have reviewed the patient's current medications. Allergies as of 09/24/2017      Reactions   Fentanyl Other (See Comments)   CAUSED HALLUCINATIONS/05-03-13 pt reports it was oral fentanyl that caused hallucinations   Gabapentin Other (See Comments)   Bones throbbed, headaches, weakness   Aspirin Nausea Only      Medication List        Accurate as of 09/24/17 11:59 PM. Always use your most recent med list.          albuterol 108 (90 Base) MCG/ACT inhaler Commonly known as:  PROVENTIL HFA;VENTOLIN HFA Inhale 2 puffs into the lungs every 6 (six) hours as needed for wheezing or shortness of breath.   ALPRAZolam 0.5 MG tablet Commonly known as:  XANAX Take 0.5 mg by mouth at bedtime as needed for sleep.   fluticasone 50 MCG/ACT nasal spray Commonly known as:  FLONASE Place 2 sprays into the nose as needed for allergies.   hydrochlorothiazide 25  MG tablet Commonly known as:  HYDRODIURIL Take 25 mg by mouth daily. TAKES 1/2 TABLET DAILY   HYDROcodone-acetaminophen 10-325 MG tablet Commonly known as:  NORCO Take 1 tablet by mouth every 6 (six) hours as needed. TAKES 1 - 2 EVERY 4 HOURS FOR BACK PAIN   pantoprazole 40 MG tablet Commonly known as:  PROTONIX Take 40 mg by mouth daily.   simvastatin 40 MG tablet Commonly known as:  ZOCOR Take 40 mg by mouth daily at 6 PM.   TAGAMET PO Take 1 tablet by mouth as needed (stomach). Taking about 8 daily        ROS:  A comprehensive review of systems was negative except for: Respiratory: positive for wheezing Cardiovascular: positive for hypertension Gastrointestinal: positive for reflux symptoms and indigestion Musculoskeletal: positive for back pain, bone pain, neck pain, stiff joints and right shoulder pain  Blood pressure (!) 152/69, pulse 66, temperature 98.4 F (36.9 C), resp. rate 18, height 5\' 6"  (1.676 m), weight 159 lb (72.1 kg). Physical Exam  Constitutional: He is oriented to person, place, and time and well-developed, well-nourished, and in no distress.  HENT:  Head: Normocephalic.  Eyes: Pupils are equal, round, and reactive to light.  Neck: Normal range of motion.  Cardiovascular: Normal rate and regular rhythm.  Pulmonary/Chest: Effort normal and breath sounds normal.  Abdominal: Soft. He exhibits no distension. There is no tenderness.  Musculoskeletal: Normal range of motion. He exhibits no edema.  Some decreased range of motion on right shoulder, 3X3.5 cm mass over the superior portion of the scapula; cannot tell if mobile due to bony surroundings, no erythema or drainage  Neurological: He is alert and oriented to person, place, and time.  Skin: Skin is warm and dry.  Psychiatric: Mood, memory, affect and judgment normal.  Vitals reviewed.   Results: Reviewed imaging, and called Radiology prior to their read, the area over the right scapula is  consistent with a lipoma and does not extend down into the muscle   Ct Chest Wo Contrast  Result Date: 09/24/2017 CLINICAL DATA:  History of lung cancer.  Follow-up. EXAM: CT CHEST WITHOUT CONTRAST TECHNIQUE: Multidetector CT imaging of the chest was performed following the standard protocol without IV contrast. COMPARISON:  04/03/2017 FINDINGS: Cardiovascular: Normal heart size. Aortic atherosclerosis. Calcifications within the RCA and LAD and left circumflex coronary arteries noted. No pericardial effusion. Mediastinum/Nodes: No mediastinal or hilar adenopathy. Moderate size hiatal hernia identified. The trachea appears patent and  is midline. Lungs/Pleura: No pleural effusion. Moderate to advanced changes of emphysema. Status post right upper lobectomy. There is compensatory hyperexpansion of the right middle and lower lobes. Ill defined area of ground-glass attenuation, septal thickening and architectural distortion and nodularity in the left mid lung is again noted. This involves the left upper and lower lobes. Index nodule within this area is again noted measuring 9 mm, image 88 of series 4. Unchanged from previous exam. These findings are compatible with chronic changes of external beam radiation. Perifissural pleuroparenchymal scarring within the lingula is unchanged from previous study. 3 mm left apical nodule is unchanged. Upper Abdomen: No acute abnormality. Musculoskeletal: No aggressive lytic or sclerotic bone lesions identified. Within the right posterior chest wall there is a fat attenuating structure measuring 1.0 x 2.5 by 4.0 cm (volume = 5.2 cm^3). Favor lipoma. IMPRESSION: 1. Status post treatment related changes of prior radiation therapy in the left lung is redemonstrated, as above. No findings to suggest residual/recurrent disease on today's study. 2. Status post right upper lobectomy 3. Aortic Atherosclerosis (ICD10-I70.0) and Emphysema (ICD10-J43.9). 4. Hiatal hernia 5. Right posterior  chest wall lipoma. Electronically Signed   By: Kerby Moors M.D.   On: 09/24/2017 15:06    Assessment & Plan:  PURCELL JUNGBLUTH is a 72 y.o. male with a lipoma over the right scapula region that does not appear to involve any musculature.  Given that it is superficial, I am not sure if his shoulder pain is related to this lipoma or not.  He does have some decreased range of motion, and feels like the mass is making him have the discomfort. We have discussed that the mass could be causing some pulling and tension, but it could also have nothing to do with his right arm pain and decreased range of motion.  We have discussed also that this appears to be a normal benign lipoma, and has a low risk for malignancy. Given everything, the patient wants to proceed with removal. He has no cardiac symptoms, and has been seen by cardiology with a recent negative stress test 04/2017.  -OR for removal of the lipoma   All questions were answered to the satisfaction of the patient and family.  The risk and benefits of lipoma removal were discussed including but not limited to bleeding, infection, risk of recurrence, risk of not improving his pain, risk of malignancy although rare.  After careful consideration, XSAVIER SEELEY has decided to proceed.    Virl Cagey 09/25/2017, 11:13 AM

## 2017-09-25 NOTE — Progress Notes (Signed)
Radiation Oncology         [phone] ________________________________  Name: Maurice Orozco MRN: [id number]  Date: 09/25/2017  DOB: 08/27/1946  Follow-Up Visit Note  Outpatient  CC: Maurice School, Maurice Orozco  Farrel Gobble, Maurice Orozco  Diagnosis and Prior Radiotherapy:    ICD-10-CM   1. 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  CHIEF COMPLAINT: Here for follow-up and surveillance of lung cancer  Narrative: Maurice Orozco presents for follow up of radiation completed 07/10/2014 to his left lower lung.  Patient underwent a CT chest without contrast on 09/24/17 which revealed status post right upper lobectomy, aortic atherosclerosis, emphysema, hiatal hernia and right posterior chest wall lipoma. No aggressive lytic or sclerotic bone lesions Identified. No findings to suggest residual/ recurrent diease.           I reviewed the images with him              On the review of symptoms he reports shortness of breath with activity. He did walk from the parking lot today with some difficulty. He currently has sinus drainage which has caused congestion. He is eating well. He had a CT scan done yesterday and is here for the results. Patient reported that he has had a recent back surgery. Paeint reports that he is still chewing tobacco.  He is going to have a benign-appearing growth removed from his right upper back soon, this looks like a lipoma on imaging  ALLERGIES:  is allergic to fentanyl; gabapentin; aspirin; and celecoxib.  Meds: Current Outpatient Medications  Medication Sig Dispense Refill  . albuterol (PROVENTIL HFA;VENTOLIN HFA) 108 (90 BASE) MCG/ACT inhaler Inhale 2 puffs into the lungs every 6 (six) hours as needed for wheezing or shortness of breath.     . ALPRAZolam (XANAX) 0.5 MG tablet Take 0.5 mg by mouth at  bedtime as needed for sleep.     . Cimetidine (TAGAMET PO) Take 1 tablet by mouth as needed (stomach). Taking about 8 daily    . fluticasone (FLONASE) 50 MCG/ACT nasal spray Place 2 sprays into the nose as needed for allergies.     . hydrochlorothiazide 25 MG tablet Take 25 mg by mouth daily. TAKES 1/2 TABLET DAILY     . HYDROcodone-acetaminophen (NORCO) 10-325 MG per tablet Take 1 tablet by mouth every 6 (six) hours as needed. TAKES 1 - 2 EVERY 4 HOURS FOR BACK PAIN     . pantoprazole (PROTONIX) 40 MG tablet Take 40 mg by mouth daily.    . simvastatin (ZOCOR) 40 MG tablet Take 40 mg by mouth daily at 6 PM.      No current facility-administered medications for this encounter.     Physical Findings: The patient is in no acute distress. Patient is alert and oriented.  height is 5\' 6"  (1.676 m) and weight is 159 lb (72.1 kg). His temperature is 98 F (36.7 C). His blood pressure is 139/79 and his pulse is 75. His oxygen saturation is 97%.    General: Alert and oriented, in no acute distress HEENT: Head is normocephalic. Extraocular movements are intact. Oropharynx is clear.  Dentures were removed Neck: Cystic subcutaneous mass in the posterior level 3 region of the left neck which is about 1 cm in size. No other masses appreciated in the neck.  Heart: Regular in rate and rhythm with no murmurs, rubs, or gallops. Chest: Clear to auscultation bilaterally, with no rhonchi, wheezes, or rales. Abdomen: Soft, nontender, nondistended, with no rigidity or guarding. Extremities: No cyanosis or edema. Lymphatics: see Neck Exam Skin: No concerning lesions. Musculoskeletal: symmetric strength and muscle tone throughout.  He appears to have a lipoma at the superior medial aspect of the right scapula Neurologic: Cranial nerves II through XII are grossly intact. No obvious focalities. Speech is fluent. Coordination is intact. Psychiatric: Judgment and insight are intact. Affect is appropriate.      Lab  Findings: Lab Results  Component Value Date   WBC 7.8 03/13/2017   HGB 12.7 (L) 03/13/2017   HCT 38.0 (L) 03/13/2017   MCV 95.7 03/13/2017   PLT 222 03/13/2017    Radiographic Findings: Ct Chest Wo Contrast  Result Date: 09/24/2017 CLINICAL DATA:  History of lung cancer.  Follow-up. EXAM: CT CHEST WITHOUT CONTRAST TECHNIQUE: Multidetector CT imaging of the chest was performed following the standard protocol without IV contrast. COMPARISON:  04/03/2017 FINDINGS: Cardiovascular: Normal heart size. Aortic atherosclerosis. Calcifications within the RCA and LAD and left circumflex coronary arteries noted. No pericardial effusion. Mediastinum/Nodes: No mediastinal or hilar adenopathy. Moderate size hiatal hernia identified. The trachea appears patent and is midline. Lungs/Pleura: No pleural effusion. Moderate to advanced changes of emphysema. Status post right upper lobectomy. There is compensatory hyperexpansion of the right middle and lower lobes. Ill defined area of ground-glass attenuation, septal thickening and architectural distortion and nodularity in the left mid lung is again noted. This involves the left upper and lower lobes. Index nodule within this area is again noted measuring 9 mm, image 88 of series 4. Unchanged from previous exam. These findings are compatible with chronic changes of external beam radiation. Perifissural pleuroparenchymal scarring within the lingula is unchanged from previous study. 3 mm left apical nodule is unchanged. Upper Abdomen: No acute abnormality. Musculoskeletal: No aggressive lytic or sclerotic bone lesions identified. Within the right posterior chest wall there is a fat attenuating structure measuring 1.0 x 2.5 by 4.0 cm (volume = 5.2 cm^3). Favor lipoma. IMPRESSION: 1. Status post treatment related changes of prior radiation therapy in the left lung is redemonstrated, as above. No findings to suggest residual/recurrent disease on today's study. 2. Status post  right upper lobectomy 3. Aortic Atherosclerosis (ICD10-I70.0) and Emphysema (ICD10-J43.9). 4. Hiatal hernia 5. Right posterior chest wall lipoma. Electronically Signed   By: Kerby Moors M.D.   On: 09/24/2017 15:06    Impression/Plan:  72 y.o. gentleman with Left lower lobe nodule suspicious for primary lung carcinoma, T1aN0M0 Stage IA.  No evidence of cancer progression or recurrence.  CT scan will be done in [facility] per patient request. His follow up appointment will be a day after with radiation oncology. Follow up will be in 6 months. I once again urged him to stop chewing tobacco.  He said he will cut down but is not willing to quit I spent 25 minutes face to face with the patient and more than 50% of that time was spent in counseling and/or coordination of care. _____________________________________   Eppie Gibson, Maurice Orozco   This document serves as a record of services personally performed by Eppie Gibson Maurice Orozco. It was created on her behalf by Delton Coombes, a trained medical scribe. The creation of this record is based on the scribe's personal observations and the provider's statements to them.

## 2017-09-25 NOTE — Progress Notes (Signed)
Maurice Orozco presents for follow up of radiation completed 07/10/2014 to his left lower lung. He reports shortness of breath with activity. He did walk from the parking lot today with some difficulty. He currently has sinus drainage which has caused congestion. He is eating well. He had a CT scan done yesterday and is here for the results.   BP 139/79   Pulse 75   Temp 98 F (36.7 C)   Ht 5\' 6"  (1.676 m)   Wt 159 lb (72.1 kg)   SpO2 97%   BMI 25.66 kg/m    Wt Readings from Last 3 Encounters:  09/25/17 159 lb (72.1 kg)  09/24/17 159 lb (72.1 kg)  05/12/17 154 lb (69.9 kg)

## 2017-10-07 DIAGNOSIS — Z1211 Encounter for screening for malignant neoplasm of colon: Secondary | ICD-10-CM | POA: Diagnosis not present

## 2017-10-07 NOTE — Patient Instructions (Signed)
[facility]  10/07/2017     @PREFPERIOPPHARMACY @   Your procedure is scheduled on  10/14/2017 .  Report to [facility] at  835   A.M.  Call this number if you have problems the morning of surgery:  [phone]   Remember:  Do not eat food or drink liquids after midnight.  Take these medicines the morning of surgery with A SIP OF WATER  Tagamet, hydrocodone, protonix.   Do not wear jewelry, make-up or nail polish.  Do not wear lotions, powders, or perfumes, or deodorant.  Do not shave 48 hours prior to surgery.  Men may shave face and neck.  Do not bring valuables to the hospital.  [facility] is not responsible for any belongings or valuables.  Contacts, dentures or bridgework may not be worn into surgery.  Leave your suitcase in the car.  After surgery it may be brought to your room.  For patients admitted to the hospital, discharge time will be determined by your treatment team.  Patients discharged the day of surgery will not be allowed to drive home.   Name and phone number of your driver:  family Special instructions:  None  Please read over the following fact sheets that you were given. Anesthesia Post-op Instructions and Care and Recovery After Surgery       Lipoma Removal Lipoma removal is a surgical procedure to remove a noncancerous (benign) tumor that is made up of fat cells (lipoma). Most lipomas are small and painless and do not require treatment. They can form in many areas of the body but are most common under the skin of the back, shoulders, arms, and thighs. You may need lipoma removal if you have a lipoma that is large, growing, or causing discomfort. Lipoma removal may also be done for cosmetic reasons. Tell a health care provider about:  Any allergies you have.  All medicines you are taking, including vitamins, herbs, eye drops, creams, and over-the-counter medicines.  Any problems you or family members have had with anesthetic  medicines.  Any blood disorders you have.  Any surgeries you have had.  Any medical conditions you have.  Whether you are pregnant or may be pregnant. What are the risks? Generally, this is a safe procedure. However, problems may occur, including:  Infection.  Bleeding.  Allergic reactions to medicines.  Damage to nerves or blood vessels near the lipoma.  Scarring.  What happens before the procedure? Staying hydrated Follow instructions from your health care provider about hydration, which may include:  Up to 2 hours before the procedure - you may continue to drink clear liquids, such as water, clear fruit juice, black coffee, and plain tea.  Eating and drinking restrictions Follow instructions from your health care provider about eating and drinking, which may include:  8 hours before the procedure - stop eating heavy meals or foods such as meat, fried foods, or fatty foods.  6 hours before the procedure - stop eating light meals or foods, such as toast or cereal.  6 hours before the procedure - stop drinking milk or drinks that contain milk.  2 hours before the procedure - stop drinking clear liquids.  Medicines  Ask your health care provider about: ? Changing or stopping your regular medicines. This is especially important if you are taking diabetes medicines or blood thinners. ? Taking medicines such as aspirin and ibuprofen. These medicines can thin your blood. Do not  take these medicines before your procedure if your health care provider instructs you not to.  You may be given antibiotic medicine to help prevent infection. General instructions  Ask your health care provider how your surgical site will be marked or identified.  You will have a physical exam. Your health care provider will check the size of the lipoma and whether it can be moved easily.  You may have imaging tests, such as: ? X-rays. ? CT scan. ? MRI.  Plan to have someone take you home  from the hospital or clinic. What happens during the procedure?  To reduce your risk of infection: ? Your health care team will wash or sanitize their hands. ? Your skin will be washed with soap.  You will be given one or more of the following: ? A medicine to help you relax (sedative). ? A medicine to numb the area (local anesthetic). ? A medicine to make you fall asleep (general anesthetic). ? A medicine that is injected into an area of your body to numb everything below the injection site (regional anesthetic).  An incision will be made over the lipoma or very near the lipoma. The incision may be made in a natural skin line or crease.  Tissues, nerves, and blood vessels near the lipoma will be moved out of the way.  The lipoma and the capsule that surrounds it will be separated from the surrounding tissues.  The lipoma will be removed.  The incision may be closed with stitches (sutures).  A bandage (dressing) will be placed over the incision. What happens after the procedure?  Do not drive for 24 hours if you received a sedative.  Your blood pressure, heart rate, breathing rate, and blood oxygen level will be monitored until the medicines you were given have worn off. This information is not intended to replace advice given to you by your health care provider. Make sure you discuss any questions you have with your health care provider. Document Released: 11/08/2015 Document Revised: 01/31/2016 Document Reviewed: 11/08/2015 Elsevier Interactive Patient Education  2018 [employer].  Lipoma Removal, Care After Refer to this sheet in the next few weeks. These instructions provide you with information about caring for yourself after your procedure. Your health care provider may also give you more specific instructions. Your treatment has been planned according to current medical practices, but problems sometimes occur. Call your health care provider if you have any problems or  questions after your procedure. What can I expect after the procedure? After the procedure, it is common to have:  Mild pain.  Swelling.  Bruising.  Follow these instructions at home:  Bathing  Do not take baths, swim, or use a hot tub until your health care provider approves. Ask your health care provider if you can take showers. You may only be allowed to take sponge baths for bathing.  Keep your bandage (dressing) dry until your health care provider says it can be removed. Incision care   Follow instructions from your health care provider about how to take care of your incision. Make sure you: ? Wash your hands with soap and water before you change your bandage (dressing). If soap and water are not available, use hand sanitizer. ? Change your dressing as told by your health care provider. ? Leave stitches (sutures), skin glue, or adhesive strips in place. These skin closures may need to stay in place for 2 weeks or longer. If adhesive strip edges start to loosen and curl  up, you may trim the loose edges. Do not remove adhesive strips completely unless your health care provider tells you to do that.  Check your incision area every day for signs of infection. Check for: ? More redness, swelling, or pain. ? Fluid or blood. ? Warmth. ? Pus or a bad smell. Driving  Do not drive or operate heavy machinery while taking prescription pain medicine.  Do not drive for 24 hours if you received a medicine to help you relax (sedative) during your procedure.  Ask your health care provider when it is safe for you to drive. General instructions  Take over-the-counter and prescription medicines only as told by your health care provider.  Do not use any tobacco products, such as cigarettes, chewing tobacco, and e-cigarettes. These can delay healing. If you need help quitting, ask your health care provider.  Return to your normal activities as told by your health care provider. Ask your  health care provider what activities are safe for you.  Keep all follow-up visits as told by your health care provider. This is important. Contact a health care provider if:  You have more redness, swelling, or pain around your incision.  You have fluid or blood coming from your incision.  Your incision feels warm to the touch.  You have pus or a bad smell coming from your incision.  You have pain that does not get better with medicine. Get help right away if:  You have chills or a fever.  You have severe pain. This information is not intended to replace advice given to you by your health care provider. Make sure you discuss any questions you have with your health care provider. Document Released: 11/08/2015 Document Revised: 02/05/2016 Document Reviewed: 11/08/2015 Elsevier Interactive Patient Education  2018 [place] Anesthesia is a term that refers to techniques, procedures, and medicines that help a person stay safe and comfortable during a medical procedure. Monitored anesthesia care, or sedation, is one type of anesthesia. Your anesthesia specialist may recommend sedation if you will be having a procedure that does not require you to be unconscious, such as:  Cataract surgery.  A dental procedure.  A biopsy.  A colonoscopy.  During the procedure, you may receive a medicine to help you relax (sedative). There are three levels of sedation:  Mild sedation. At this level, you may feel awake and relaxed. You will be able to follow directions.  Moderate sedation. At this level, you will be sleepy. You may not remember the procedure.  Deep sedation. At this level, you will be asleep. You will not remember the procedure.  The more medicine you are given, the deeper your level of sedation will be. Depending on how you respond to the procedure, the anesthesia specialist may change your level of sedation or the type of anesthesia to fit your  needs. An anesthesia specialist will monitor you closely during the procedure. Let your health care provider know about:  Any allergies you have.  All medicines you are taking, including vitamins, herbs, eye drops, creams, and over-the-counter medicines.  Any use of steroids (by mouth or as a cream).  Any problems you or family members have had with sedatives and anesthetic medicines.  Any blood disorders you have.  Any surgeries you have had.  Any medical conditions you have, such as sleep apnea.  Whether you are pregnant or may be pregnant.  Any use of cigarettes, alcohol, or street drugs. What are the risks? Generally,  this is a safe procedure. However, problems may occur, including:  Getting too much medicine (oversedation).  Nausea.  Allergic reaction to medicines.  Trouble breathing. If this happens, a breathing tube may be used to help with breathing. It will be removed when you are awake and breathing on your own.  Heart trouble.  Lung trouble.  Before the procedure Staying hydrated Follow instructions from your health care provider about hydration, which may include:  Up to 2 hours before the procedure - you may continue to drink clear liquids, such as water, clear fruit juice, black coffee, and plain tea.  Eating and drinking restrictions Follow instructions from your health care provider about eating and drinking, which may include:  8 hours before the procedure - stop eating heavy meals or foods such as meat, fried foods, or fatty foods.  6 hours before the procedure - stop eating light meals or foods, such as toast or cereal.  6 hours before the procedure - stop drinking milk or drinks that contain milk.  2 hours before the procedure - stop drinking clear liquids.  Medicines Ask your health care provider about:  Changing or stopping your regular medicines. This is especially important if you are taking diabetes medicines or blood thinners.  Taking  medicines such as aspirin and ibuprofen. These medicines can thin your blood. Do not take these medicines before your procedure if your health care provider instructs you not to.  Tests and exams  You will have a physical exam.  You may have blood tests done to show: ? How well your kidneys and liver are working. ? How well your blood can clot.  General instructions  Plan to have someone take you home from the hospital or clinic.  If you will be going home right after the procedure, plan to have someone with you for 24 hours.  What happens during the procedure?  Your blood pressure, heart rate, breathing, level of pain and overall condition will be monitored.  An IV tube will be inserted into one of your veins.  Your anesthesia specialist will give you medicines as needed to keep you comfortable during the procedure. This may mean changing the level of sedation.  The procedure will be performed. After the procedure  Your blood pressure, heart rate, breathing rate, and blood oxygen level will be monitored until the medicines you were given have worn off.  Do not drive for 24 hours if you received a sedative.  You may: ? Feel sleepy, clumsy, or nauseous. ? Feel forgetful about what happened after the procedure. ? Have a sore throat if you had a breathing tube during the procedure. ? Vomit. This information is not intended to replace advice given to you by your health care provider. Make sure you discuss any questions you have with your health care provider. Document Released: 05/21/2005 Document Revised: 02/01/2016 Document Reviewed: 12/16/2015 Elsevier Interactive Patient Education  2018 [place], Care After These instructions provide you with information about caring for yourself after your procedure. Your health care provider may also give you more specific instructions. Your treatment has been planned according to current medical practices,  but problems sometimes occur. Call your health care provider if you have any problems or questions after your procedure. What can I expect after the procedure? After your procedure, it is common to:  Feel sleepy for several hours.  Feel clumsy and have poor balance for several hours.  Feel forgetful about what happened after the  procedure.  Have poor judgment for several hours.  Feel nauseous or vomit.  Have a sore throat if you had a breathing tube during the procedure.  Follow these instructions at home: For at least 24 hours after the procedure:   Do not: ? Participate in activities in which you could fall or become injured. ? Drive. ? Use heavy machinery. ? Drink alcohol. ? Take sleeping pills or medicines that cause drowsiness. ? Make important decisions or sign legal documents. ? Take care of children on your own.  Rest. Eating and drinking  Follow the diet that is recommended by your health care provider.  If you vomit, drink water, juice, or soup when you can drink without vomiting.  Make sure you have little or no nausea before eating solid foods. General instructions  Have a responsible adult stay with you until you are awake and alert.  Take over-the-counter and prescription medicines only as told by your health care provider.  If you smoke, do not smoke without supervision.  Keep all follow-up visits as told by your health care provider. This is important. Contact a health care provider if:  You keep feeling nauseous or you keep vomiting.  You feel light-headed.  You develop a rash.  You have a fever. Get help right away if:  You have trouble breathing. This information is not intended to replace advice given to you by your health care provider. Make sure you discuss any questions you have with your health care provider. Document Released: 12/16/2015 Document Revised: 04/16/2016 Document Reviewed: 12/16/2015 Elsevier Interactive Patient  Education  [employer].

## 2017-10-08 DIAGNOSIS — Z981 Arthrodesis status: Secondary | ICD-10-CM | POA: Diagnosis not present

## 2017-10-08 DIAGNOSIS — M5135 Other intervertebral disc degeneration, thoracolumbar region: Secondary | ICD-10-CM | POA: Diagnosis not present

## 2017-10-08 DIAGNOSIS — M5136 Other intervertebral disc degeneration, lumbar region: Secondary | ICD-10-CM | POA: Diagnosis not present

## 2017-10-08 DIAGNOSIS — I700 Atherosclerosis of aorta: Secondary | ICD-10-CM | POA: Diagnosis not present

## 2017-10-08 DIAGNOSIS — M4156 Other secondary scoliosis, lumbar region: Secondary | ICD-10-CM | POA: Diagnosis not present

## 2017-10-08 DIAGNOSIS — M4185 Other forms of scoliosis, thoracolumbar region: Secondary | ICD-10-CM | POA: Diagnosis not present

## 2017-10-08 DIAGNOSIS — Z4789 Encounter for other orthopedic aftercare: Secondary | ICD-10-CM | POA: Diagnosis not present

## 2017-10-08 DIAGNOSIS — M47899 Other spondylosis, site unspecified: Secondary | ICD-10-CM | POA: Diagnosis not present

## 2017-10-08 DIAGNOSIS — M47816 Spondylosis without myelopathy or radiculopathy, lumbar region: Secondary | ICD-10-CM | POA: Diagnosis not present

## 2017-10-08 DIAGNOSIS — M5416 Radiculopathy, lumbar region: Secondary | ICD-10-CM | POA: Diagnosis not present

## 2017-10-08 DIAGNOSIS — M4316 Spondylolisthesis, lumbar region: Secondary | ICD-10-CM | POA: Diagnosis not present

## 2017-10-09 ENCOUNTER — Encounter (HOSPITAL_COMMUNITY)
Admission: RE | Admit: 2017-10-09 | Discharge: 2017-10-09 | Disposition: A | Discharge status: Home / Self Care | Payer: Medicare Other | Source: Ambulatory Visit | Attending: General Surgery | Admitting: General Surgery

## 2017-10-09 ENCOUNTER — Encounter (HOSPITAL_COMMUNITY): Payer: Self-pay

## 2017-10-09 DIAGNOSIS — Z01812 Encounter for preprocedural laboratory examination: Secondary | ICD-10-CM | POA: Diagnosis not present

## 2017-10-09 LAB — BASIC METABOLIC PANEL
ANION GAP: 11 (ref 5–15)
BUN: 20 mg/dL (ref 6–20)
CHLORIDE: 98 mmol/L — AB (ref 101–111)
CO2: 25 mmol/L (ref 22–32)
Calcium: 8.8 mg/dL — ABNORMAL LOW (ref 8.9–10.3)
Creatinine, Ser: 0.74 mg/dL (ref 0.61–1.24)
GFR calc Af Amer: >60 mL/min (ref >60)
GFR calc non Af Amer: >60 mL/min (ref >60)
Glucose, Bld: 134 mg/dL — ABNORMAL HIGH (ref 65–99)
POTASSIUM: 3.6 mmol/L (ref 3.5–5.1)
Sodium: 134 mmol/L — ABNORMAL LOW (ref 135–145)

## 2017-10-09 LAB — CBC
HCT: 37.7 % — ABNORMAL LOW (ref 39.0–52.0)
HEMOGLOBIN: 12 g/dL — AB (ref 13.0–17.0)
MCH: 30.4 pg (ref 26.0–34.0)
MCHC: 31.8 g/dL (ref 30.0–36.0)
MCV: 95.4 fL (ref 78.0–100.0)
Platelets: 272 10*3/uL (ref 150–400)
RBC: 3.95 MIL/uL — AB (ref 4.22–5.81)
RDW: 12.9 % (ref 11.5–15.5)
WBC: 6.7 10*3/uL (ref 4.0–10.5)

## 2017-10-14 ENCOUNTER — Ambulatory Visit (HOSPITAL_COMMUNITY): Payer: Medicare Other | Admitting: Anesthesiology

## 2017-10-14 ENCOUNTER — Encounter (HOSPITAL_COMMUNITY)
Admission: RE | Disposition: A | Discharge status: Home / Self Care | Payer: Self-pay | Source: Ambulatory Visit | Attending: General Surgery

## 2017-10-14 ENCOUNTER — Encounter (HOSPITAL_COMMUNITY): Payer: Self-pay

## 2017-10-14 ENCOUNTER — Ambulatory Visit (HOSPITAL_COMMUNITY)
Admission: RE | Admit: 2017-10-14 | Discharge: 2017-10-14 | Disposition: A | Discharge status: Home / Self Care | Payer: Medicare Other | Source: Ambulatory Visit | Attending: General Surgery | Admitting: General Surgery

## 2017-10-14 ENCOUNTER — Other Ambulatory Visit: Payer: Self-pay

## 2017-10-14 DIAGNOSIS — I2510 Atherosclerotic heart disease of native coronary artery without angina pectoris: Secondary | ICD-10-CM | POA: Insufficient documentation

## 2017-10-14 DIAGNOSIS — Z8249 Family history of ischemic heart disease and other diseases of the circulatory system: Secondary | ICD-10-CM | POA: Insufficient documentation

## 2017-10-14 DIAGNOSIS — Z923 Personal history of irradiation: Secondary | ICD-10-CM | POA: Diagnosis not present

## 2017-10-14 DIAGNOSIS — Z841 Family history of disorders of kidney and ureter: Secondary | ICD-10-CM | POA: Insufficient documentation

## 2017-10-14 DIAGNOSIS — Z888 Allergy status to other drugs, medicaments and biological substances status: Secondary | ICD-10-CM | POA: Diagnosis not present

## 2017-10-14 DIAGNOSIS — M19049 Primary osteoarthritis, unspecified hand: Secondary | ICD-10-CM | POA: Insufficient documentation

## 2017-10-14 DIAGNOSIS — Z886 Allergy status to analgesic agent status: Secondary | ICD-10-CM | POA: Diagnosis not present

## 2017-10-14 DIAGNOSIS — Z87891 Personal history of nicotine dependence: Secondary | ICD-10-CM | POA: Insufficient documentation

## 2017-10-14 DIAGNOSIS — M549 Dorsalgia, unspecified: Secondary | ICD-10-CM | POA: Diagnosis not present

## 2017-10-14 DIAGNOSIS — K219 Gastro-esophageal reflux disease without esophagitis: Secondary | ICD-10-CM | POA: Insufficient documentation

## 2017-10-14 DIAGNOSIS — Z8042 Family history of malignant neoplasm of prostate: Secondary | ICD-10-CM | POA: Diagnosis not present

## 2017-10-14 DIAGNOSIS — Z9889 Other specified postprocedural states: Secondary | ICD-10-CM | POA: Insufficient documentation

## 2017-10-14 DIAGNOSIS — Z885 Allergy status to narcotic agent status: Secondary | ICD-10-CM | POA: Diagnosis not present

## 2017-10-14 DIAGNOSIS — Z79899 Other long term (current) drug therapy: Secondary | ICD-10-CM | POA: Diagnosis not present

## 2017-10-14 DIAGNOSIS — Z902 Acquired absence of lung [part of]: Secondary | ICD-10-CM | POA: Insufficient documentation

## 2017-10-14 DIAGNOSIS — I10 Essential (primary) hypertension: Secondary | ICD-10-CM | POA: Insufficient documentation

## 2017-10-14 DIAGNOSIS — D171 Benign lipomatous neoplasm of skin and subcutaneous tissue of trunk: Secondary | ICD-10-CM | POA: Diagnosis not present

## 2017-10-14 DIAGNOSIS — E7800 Pure hypercholesterolemia, unspecified: Secondary | ICD-10-CM | POA: Insufficient documentation

## 2017-10-14 DIAGNOSIS — Z85118 Personal history of other malignant neoplasm of bronchus and lung: Secondary | ICD-10-CM | POA: Diagnosis not present

## 2017-10-14 DIAGNOSIS — Z7951 Long term (current) use of inhaled steroids: Secondary | ICD-10-CM | POA: Diagnosis not present

## 2017-10-14 DIAGNOSIS — I700 Atherosclerosis of aorta: Secondary | ICD-10-CM | POA: Insufficient documentation

## 2017-10-14 DIAGNOSIS — K449 Diaphragmatic hernia without obstruction or gangrene: Secondary | ICD-10-CM | POA: Insufficient documentation

## 2017-10-14 DIAGNOSIS — J449 Chronic obstructive pulmonary disease, unspecified: Secondary | ICD-10-CM | POA: Diagnosis not present

## 2017-10-14 DIAGNOSIS — G8929 Other chronic pain: Secondary | ICD-10-CM | POA: Insufficient documentation

## 2017-10-14 DIAGNOSIS — F419 Anxiety disorder, unspecified: Secondary | ICD-10-CM | POA: Diagnosis not present

## 2017-10-14 HISTORY — PX: LIPOMA EXCISION: SHX5283

## 2017-10-14 SURGERY — EXCISION LIPOMA
Anesthesia: Monitor Anesthesia Care

## 2017-10-14 MED ORDER — MIDAZOLAM HCL 2 MG/2ML IJ SOLN
INTRAMUSCULAR | Status: AC
  Filled 2017-10-14: qty 2

## 2017-10-14 MED ORDER — CEFAZOLIN SODIUM-DEXTROSE 2-4 GM/100ML-% IV SOLN: 2.0000 g | INTRAVENOUS | Status: DC

## 2017-10-14 MED ORDER — FENTANYL CITRATE (PF) 100 MCG/2ML IJ SOLN
INTRAMUSCULAR | Status: AC
  Filled 2017-10-14: qty 2

## 2017-10-14 MED ORDER — BUPIVACAINE HCL (PF) 0.5 % IJ SOLN
INTRAMUSCULAR | Status: AC
  Filled 2017-10-14: qty 30

## 2017-10-14 MED ORDER — MORPHINE SULFATE (PF) 2 MG/ML IV SOLN
INTRAVENOUS | Status: AC
  Filled 2017-10-14: qty 1

## 2017-10-14 MED ORDER — LIDOCAINE HCL (PF) 1 % IJ SOLN
INTRAMUSCULAR | Status: AC
  Filled 2017-10-14: qty 30

## 2017-10-14 MED ORDER — MIDAZOLAM HCL 5 MG/5ML IJ SOLN
INTRAMUSCULAR | Status: DC | PRN
  Administered 2017-10-14: 1 mg via INTRAVENOUS

## 2017-10-14 MED ORDER — LACTATED RINGERS IV SOLN
INTRAVENOUS | Status: DC
  Administered 2017-10-14 (×2): via INTRAVENOUS

## 2017-10-14 MED ORDER — HEMOSTATIC AGENTS (NO CHARGE) OPTIME
TOPICAL | Status: DC | PRN
  Administered 2017-10-14: 1 via TOPICAL

## 2017-10-14 MED ORDER — LIDOCAINE HCL (PF) 1 % IJ SOLN
INTRAMUSCULAR | Status: AC
  Filled 2017-10-14: qty 10

## 2017-10-14 MED ORDER — MIDAZOLAM HCL 2 MG/2ML IJ SOLN
1.0000 mg | INTRAMUSCULAR | Status: AC
  Administered 2017-10-14: 2 mg via INTRAVENOUS

## 2017-10-14 MED ORDER — SODIUM CHLORIDE 0.9 % IR SOLN
Status: DC | PRN
  Administered 2017-10-14: 1000 mL

## 2017-10-14 MED ORDER — CHLORHEXIDINE GLUCONATE CLOTH 2 % EX PADS: 6.0000 | MEDICATED_PAD | Freq: Once | CUTANEOUS | Status: DC

## 2017-10-14 MED ORDER — CEFAZOLIN SODIUM-DEXTROSE 2-4 GM/100ML-% IV SOLN
2.0000 g | INTRAVENOUS | Status: AC
  Administered 2017-10-14: 2 g via INTRAVENOUS
  Filled 2017-10-14: qty 100

## 2017-10-14 MED ORDER — LIDOCAINE HCL 1 % IJ SOLN
INTRAMUSCULAR | Status: DC | PRN
  Administered 2017-10-14: 35 mL via INTRAMUSCULAR

## 2017-10-14 MED ORDER — MORPHINE SULFATE (PF) 2 MG/ML IV SOLN
2.0000 mg | Freq: Once | INTRAVENOUS | Status: AC
  Administered 2017-10-14: 2 mg via INTRAVENOUS

## 2017-10-14 MED ORDER — PROPOFOL 500 MG/50ML IV EMUL
INTRAVENOUS | Status: DC | PRN
  Administered 2017-10-14: 50 ug/kg/min via INTRAVENOUS

## 2017-10-14 MED ORDER — SUFENTANIL CITRATE 50 MCG/ML IV SOLN
INTRAVENOUS | Status: AC
  Filled 2017-10-14: qty 1

## 2017-10-14 MED ORDER — MORPHINE SULFATE (PF) 4 MG/ML IV SOLN
INTRAVENOUS | Status: AC
  Filled 2017-10-14: qty 1

## 2017-10-14 MED ORDER — ARTIFICIAL TEARS OPHTHALMIC OINT
TOPICAL_OINTMENT | OPHTHALMIC | Status: AC
  Filled 2017-10-14: qty 3.5

## 2017-10-14 MED ORDER — PROPOFOL 10 MG/ML IV BOLUS
INTRAVENOUS | Status: AC
  Filled 2017-10-14: qty 20

## 2017-10-14 SURGICAL SUPPLY — 35 items
ADH SKN CLS APL DERMABOND .7 (GAUZE/BANDAGES/DRESSINGS) ×1
BAG HAMPER (MISCELLANEOUS) ×2 IMPLANT
CHLORAPREP W/TINT 26ML (MISCELLANEOUS) ×2 IMPLANT
CLOTH BEACON ORANGE TIMEOUT ST (SAFETY) ×2 IMPLANT
COVER LIGHT HANDLE STERIS (MISCELLANEOUS) ×4 IMPLANT
DERMABOND ADVANCED (GAUZE/BANDAGES/DRESSINGS) ×1
DERMABOND ADVANCED .7 DNX12 (GAUZE/BANDAGES/DRESSINGS) IMPLANT
DRSG TEGADERM 4X4.75 (GAUZE/BANDAGES/DRESSINGS) ×2 IMPLANT
ELECT NDL TIP 2.8 STRL (NEEDLE) IMPLANT
ELECT NEEDLE TIP 2.8 STRL (NEEDLE) IMPLANT
ELECT REM PT RETURN 9FT ADLT (ELECTROSURGICAL) ×2
ELECTRODE REM PT RTRN 9FT ADLT (ELECTROSURGICAL) ×1 IMPLANT
FORMALIN 10 PREFIL 120ML (MISCELLANEOUS) ×2 IMPLANT
GAUZE SPONGE 2X2 8PLY STRL LF (GAUZE/BANDAGES/DRESSINGS) ×2 IMPLANT
GLOVE BIO SURGEON STRL SZ 6.5 (GLOVE) ×2 IMPLANT
GLOVE BIOGEL PI IND STRL 6.5 (GLOVE) ×1 IMPLANT
GLOVE BIOGEL PI IND STRL 7.0 (GLOVE) ×1 IMPLANT
GLOVE BIOGEL PI INDICATOR 6.5 (GLOVE) ×1
GLOVE BIOGEL PI INDICATOR 7.0 (GLOVE) ×1
GOWN STRL REUS W/TWL LRG LVL3 (GOWN DISPOSABLE) ×4 IMPLANT
KIT ROOM TURNOVER APOR (KITS) ×2 IMPLANT
MANIFOLD NEPTUNE II (INSTRUMENTS) ×2 IMPLANT
NDL HYPO 25X1 1.5 SAFETY (NEEDLE) ×1 IMPLANT
NEEDLE HYPO 25X1 1.5 SAFETY (NEEDLE) ×2 IMPLANT
NS IRRIG 1000ML POUR BTL (IV SOLUTION) ×2 IMPLANT
PACK MINOR (CUSTOM PROCEDURE TRAY) ×2 IMPLANT
PAD ARMBOARD 7.5X6 YLW CONV (MISCELLANEOUS) ×2 IMPLANT
SET BASIN LINEN APH (SET/KITS/TRAYS/PACK) ×2 IMPLANT
SPONGE GAUZE 2X2 STER 10/PKG (GAUZE/BANDAGES/DRESSINGS) ×2
SUT ETHILON 3 0 FSL (SUTURE) IMPLANT
SUT VIC AB 3-0 SH 27 (SUTURE) ×2
SUT VIC AB 3-0 SH 27X BRD (SUTURE) ×1 IMPLANT
SUT VIC AB 4-0 PS2 27 (SUTURE) ×1 IMPLANT
SYR BULB IRRIGATION 50ML (SYRINGE) ×2 IMPLANT
SYR CONTROL 10ML LL (SYRINGE) ×2 IMPLANT

## 2017-10-14 NOTE — Op Note (Signed)
[facility] Operative Note  10/14/17  Preoperative Diagnosis: Lipomatous lesion on right back over scapula   Postoperative Diagnosis: Same    Procedure(s) Performed: Excision of lipomatous lesion, 6.5 cm    Surgeon: Lanell Matar. Constance Haw, MD   Assistants: No qualified resident was available   Anesthesia: Monitored anesthesia care and local anesthestic    Anesthesiologist: Lerry Liner, MD    Specimens: Lipoma    Estimated Blood Loss: Minimal   Blood Replacement: None    Complications: None   Wound Class:Clean    Operative Indications: Mr. 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  He has a history of lung cancer and has been through surgeries and treatment for that, and noticed in the last few months that the lesion was getting larger. Given this concern he was sent to me, and happened to have a CT chest that demonstrated the lipoma over the right scapula with no extension deep or extension into the muscle. After a discussion of the risk and benefits of excision including but not limited to bleeding, infection, risk of recurrence, risk of malignancy, he opted to proceed.   Findings:Large lipomatous lesion encased in normal subcutaneous fat, 6.5 cm lesion    Procedure: The patient was taken to the operating room and monitored anesthesia care was performed. He was placed in the left lateral decubitus position with all pressure points padded, and intravenous antibiotics were administered per protocol. The area was infiltrated with local anesthestic, and an incision was made parallel to the scapular spine which the lipoma was inferior to.  This was carried down through the dermal tissue and no obvious lipomatous capsule was identified with a single lobule but rather a conglomeration of fatty tissue with larger lobules.  This was excised with a combination of hemostat dissection and electrocautery and went to the surface  of the scapula as seen on the CT scan.  This was removed from the surface and excised in its entirety, measuring 6.5cm.  The cavity was inspected, irrigated, and final inspection revealed acceptable hemostasis.  Arista 3g was placed into the cavity. The deep space was attempted to be closed with a 3-0 interrupted Vicryl suture and the skin was closed with 4-0 Vicryl and dermabond.  All counts were correct at the end of the case. The patient was awakened from anesthesia without complication.  The patient went to the PACU in stable condition.   Curlene Labrum, MD [facility] [address] [place], [place] [address] [phone] (office)

## 2017-10-14 NOTE — Anesthesia Preprocedure Evaluation (Signed)
Anesthesia Evaluation  Patient identified by MRN, date of birth, ID band Patient awake    Reviewed: Allergy & Precautions, NPO status , Patient's Chart, lab work & pertinent test results  Airway Mallampati: III  TM Distance: >3 FB   Mouth opening: Limited Mouth Opening  Dental  (+) Edentulous Upper, Edentulous Lower   Pulmonary COPD, former smoker,  Lung CA hx..   breath sounds clear to auscultation       Cardiovascular hypertension, Pt. on medications + CAD   Rhythm:Regular Rate:Normal     Neuro/Psych    GI/Hepatic GERD  ,  Endo/Other    Renal/GU      Musculoskeletal   Abdominal   Peds  Hematology   Anesthesia Other Findings   Reproductive/Obstetrics                             Anesthesia Physical Anesthesia Plan  ASA: III  Anesthesia Plan: MAC   Post-op Pain Management:    Induction: Intravenous  PONV Risk Score and Plan:   Airway Management Planned: Simple Face Mask  Additional Equipment:   Intra-op Plan:   Post-operative Plan:   Informed Consent: I have reviewed the patients History and Physical, chart, labs and discussed the procedure including the risks, benefits and alternatives for the proposed anesthesia with the patient or authorized representative who has indicated his/her understanding and acceptance.     Plan Discussed with:   Anesthesia Plan Comments: (Fentanyl = hallucinations)        Anesthesia Quick Evaluation

## 2017-10-14 NOTE — Anesthesia Postprocedure Evaluation (Signed)
Anesthesia Post Note  Patient: Maurice Orozco  Procedure(s) Performed: EXCISION LIPOMA ON BACK (N/A )  Patient location during evaluation: PACU Anesthesia Type: MAC Level of consciousness: awake and alert and oriented Pain management: pain level controlled Vital Signs Assessment: post-procedure vital signs reviewed and stable Respiratory status: spontaneous breathing Cardiovascular status: stable Postop Assessment: no apparent nausea or vomiting Anesthetic complications: no     Last Vitals:  Vitals:   10/14/17 1045 10/14/17 1050  BP:  (!) 110/56  Pulse:    Resp: 12 12  Temp:    SpO2: 95% 96%    Last Pain:  Vitals:   10/14/17 0859  TempSrc: Oral  PainSc: 0-No pain                 Zitlaly Malson A

## 2017-10-14 NOTE — Transfer of Care (Signed)
Immediate Anesthesia Transfer of Care Note  Patient: Maurice Orozco  Procedure(s) Performed: EXCISION LIPOMA ON BACK (N/A )  Patient Location: PACU  Anesthesia Type:MAC  Level of Consciousness: awake, alert , oriented and patient cooperative  Airway & Oxygen Therapy: Patient Spontanous Breathing  Post-op Assessment: Report given to RN and Post -op Vital signs reviewed and stable  Post vital signs: Reviewed and stable  Last Vitals:  Vitals:   10/14/17 1045 10/14/17 1050  BP:  (!) 110/56  Pulse:    Resp: 12 12  Temp:    SpO2: 95% 96%    Last Pain:  Vitals:   10/14/17 0859  TempSrc: Oral  PainSc: 0-No pain         Complications: No apparent anesthesia complications

## 2017-10-14 NOTE — Interval H&P Note (Signed)
History and Physical Interval Note:  10/14/2017 10:27 AM  Maurice Orozco  has presented today for surgery, with the diagnosis of 3cm lipoma on back  The various methods of treatment have been discussed with the patient and family. After consideration of risks, benefits and other options for treatment, the patient has consented to  Procedure(s): EXCISION LIPOMA ON BACK (N/A) as a surgical intervention .  The patient's history has been reviewed, patient examined, no change in status, stable for surgery.  I have reviewed the patient's chart and labs.  Questions were answered to the patient's satisfaction.    No questions. Lesions looks small, which is unusual for a lipoma, it maybe a cyst. Will seed for pathology. R arm/ back marked.   Virl Cagey

## 2017-10-14 NOTE — Discharge Instructions (Signed)
Discharge Instructions: Shower per your regular routine. No submerging in water.  Take tylenol and ibuprofen as needed for pain control, alternating every 4-6 hours.  Take one of your Norcos for breakthrough pain (you filled this on 09/16/2017).  Take colace for constipation related to narcotic pain medication. Do not pick at the dermabond glue on your incision sites.  Do not do excessive lifting of > 10 lbs or excessive movement over your head or reaching for the next 2 weeks.  You may have a seroma form in the area where the lipoma was located. This is a fluid collection that will fill the space and slowing be reabsorbed. Do not be alarmed if the area fills with fluid after the surgery because it is likely the seroma and not a recurrence of the lipoma.    Lipoma Removal, Care After Refer to this sheet in the next few weeks. These instructions provide you with information about caring for yourself after your procedure. Your health care provider may also give you more specific instructions. Your treatment has been planned according to current medical practices, but problems sometimes occur. Call your health care provider if you have any problems or questions after your procedure. What can I expect after the procedure? After the procedure, it is common to have:  Mild pain.  Swelling.  Bruising.  Follow these instructions at home:  Bathing  Do not take baths, swim, or use a hot tub until your health care provider approves. Ask your health care provider if you can take showers. You may only be allowed to take sponge baths for bathing.  Keep your bandage (dressing) dry until your health care provider says it can be removed. Incision care   Follow instructions from your health care provider about how to take care of your incision. Make sure you: ? Wash your hands with soap and water before you change your bandage (dressing). If soap and water are not available, use hand sanitizer. ? Change  your dressing as told by your health care provider. ? Leave stitches (sutures), skin glue, or adhesive strips in place. These skin closures may need to stay in place for 2 weeks or longer. If adhesive strip edges start to loosen and curl up, you may trim the loose edges. Do not remove adhesive strips completely unless your health care provider tells you to do that.  Check your incision area every day for signs of infection. Check for: ? More redness, swelling, or pain. ? Fluid or blood. ? Warmth. ? Pus or a bad smell. Driving  Do not drive or operate heavy machinery while taking prescription pain medicine.  Do not drive for 24 hours if you received a medicine to help you relax (sedative) during your procedure.  Ask your health care provider when it is safe for you to drive. General instructions  Take over-the-counter and prescription medicines only as told by your health care provider.  Do not use any tobacco products, such as cigarettes, chewing tobacco, and e-cigarettes. These can delay healing. If you need help quitting, ask your health care provider.  Return to your normal activities as told by your health care provider. Ask your health care provider what activities are safe for you.  Keep all follow-up visits as told by your health care provider. This is important. Contact a health care provider if:  You have more redness, swelling, or pain around your incision.  You have fluid or blood coming from your incision.  Your incision feels warm  to the touch.  You have pus or a bad smell coming from your incision.  You have pain that does not get better with medicine. Get help right away if:  You have chills or a fever.  You have severe pain. This information is not intended to replace advice given to you by your health care provider. Make sure you discuss any questions you have with your health care provider. Document Released: 11/08/2015 Document Revised: 02/05/2016 Document  Reviewed: 11/08/2015 Elsevier Interactive Patient Education  2018 [employer].

## 2017-10-14 NOTE — Anesthesia Procedure Notes (Signed)
Procedure Name: MAC Date/Time: 10/14/2017 10:53 AM Performed by: Andree Elk Amy A, CRNA Pre-anesthesia Checklist: Patient identified, Timeout performed, Emergency Drugs available, Suction available and Patient being monitored Oxygen Delivery Method: Simple face mask

## 2017-10-15 ENCOUNTER — Encounter (HOSPITAL_COMMUNITY): Payer: Self-pay | Admitting: General Surgery

## 2017-10-16 ENCOUNTER — Encounter (HOSPITAL_COMMUNITY): Payer: Self-pay | Admitting: General Surgery

## 2017-10-20 ENCOUNTER — Encounter (HOSPITAL_COMMUNITY): Payer: Self-pay | Admitting: General Surgery

## 2017-10-27 ENCOUNTER — Ambulatory Visit (INDEPENDENT_AMBULATORY_CARE_PROVIDER_SITE_OTHER): Payer: Self-pay | Admitting: General Surgery

## 2017-10-27 ENCOUNTER — Encounter: Payer: Self-pay | Admitting: General Surgery

## 2017-10-27 VITALS — BP 152/75 | HR 85 | Temp 97.3°F | Resp 18 | Ht 66.0 in | Wt 160.0 lb

## 2017-10-27 DIAGNOSIS — D171 Benign lipomatous neoplasm of skin and subcutaneous tissue of trunk: Secondary | ICD-10-CM

## 2017-10-27 NOTE — Progress Notes (Signed)
[facility] Note   HPI:  72 y.o. Male presents to clinic for post-op follow-up after excision of a lipoma from his right back. Patient reports he had minimal soreness and has been doing well.   Review of Systems:  No fevers or chills no pain All other review of systems: otherwise negative   Pathology: Diagnosis Soft tissue, lipoma, right upper back - BENIGN MUSCLE - MATURE ADIPOSE TISSUE CONSISTENT WITH LIPOMA  Vital Signs:  BP (!) 152/75   Pulse 85   Temp (!) 97.3 F (36.3 C)   Resp 18   Ht 5\' 6"  (1.676 m)   Wt 160 lb (72.6 kg)   BMI 25.82 kg/m    Physical Exam:  Physical Exam  Constitutional: He is oriented to person, place, and time and well-developed, well-nourished, and in no distress.  HENT:  Head: Normocephalic.  Left posterior neck with 1cm mobile area, likely cyst versus lipoma   Pulmonary/Chest:  Right back incision c/d/i with dermabond, no swelling, no erythema or drainage  Musculoskeletal: Normal range of motion.  Neurological: He is alert and oriented to person, place, and time.  Skin:  Left thumb nail with dark spot, says he hit it about 3 weeks ago, was not there prior     Laboratory studies: None   Imaging:  None   Assessment:  72 y.o. yo Male with lipoma removed from his back. He is doing well. He did show me a small area on his left posterior neck that was mobile and superficial, likely a cyst, told him to monitor this for now and if growing or sore to return. Discussed left thumb nail darkness and question of mole, but he says that this was here prior. Told him that this was likely a hematoma, but I was just checking because if it had been there, then he would need to see dermatology.   Plan:  - PRN    All of the above recommendations were discussed with the patient, and all of patient's questions were answered to his expressed satisfaction.  Curlene Labrum, MD [facility] [address] [place], [place] [address] [phone] (office)

## 2017-11-09 DIAGNOSIS — M5417 Radiculopathy, lumbosacral region: Secondary | ICD-10-CM | POA: Diagnosis not present

## 2017-11-09 DIAGNOSIS — J449 Chronic obstructive pulmonary disease, unspecified: Secondary | ICD-10-CM | POA: Diagnosis not present

## 2017-11-09 DIAGNOSIS — C3432 Malignant neoplasm of lower lobe, left bronchus or lung: Secondary | ICD-10-CM | POA: Diagnosis not present

## 2017-11-09 DIAGNOSIS — I10 Essential (primary) hypertension: Secondary | ICD-10-CM | POA: Diagnosis not present

## 2017-11-09 DIAGNOSIS — T50905S Adverse effect of unspecified drugs, medicaments and biological substances, sequela: Secondary | ICD-10-CM | POA: Diagnosis not present

## 2017-11-09 DIAGNOSIS — Z6824 Body mass index (BMI) 24.0-24.9, adult: Secondary | ICD-10-CM | POA: Diagnosis not present

## 2017-11-09 DIAGNOSIS — G894 Chronic pain syndrome: Secondary | ICD-10-CM | POA: Diagnosis not present

## 2017-11-09 DIAGNOSIS — C3490 Malignant neoplasm of unspecified part of unspecified bronchus or lung: Secondary | ICD-10-CM | POA: Diagnosis not present

## 2018-02-04 DIAGNOSIS — Z1389 Encounter for screening for other disorder: Secondary | ICD-10-CM | POA: Diagnosis not present

## 2018-02-04 DIAGNOSIS — I10 Essential (primary) hypertension: Secondary | ICD-10-CM | POA: Diagnosis not present

## 2018-02-04 DIAGNOSIS — N419 Inflammatory disease of prostate, unspecified: Secondary | ICD-10-CM | POA: Diagnosis not present

## 2018-02-04 DIAGNOSIS — R3129 Other microscopic hematuria: Secondary | ICD-10-CM | POA: Diagnosis not present

## 2018-02-04 DIAGNOSIS — Z6825 Body mass index (BMI) 25.0-25.9, adult: Secondary | ICD-10-CM | POA: Diagnosis not present

## 2018-02-04 DIAGNOSIS — J449 Chronic obstructive pulmonary disease, unspecified: Secondary | ICD-10-CM | POA: Diagnosis not present

## 2018-02-04 DIAGNOSIS — R946 Abnormal results of thyroid function studies: Secondary | ICD-10-CM | POA: Diagnosis not present

## 2018-02-04 DIAGNOSIS — E663 Overweight: Secondary | ICD-10-CM | POA: Diagnosis not present

## 2018-02-04 DIAGNOSIS — E785 Hyperlipidemia, unspecified: Secondary | ICD-10-CM | POA: Diagnosis not present

## 2018-02-04 DIAGNOSIS — K219 Gastro-esophageal reflux disease without esophagitis: Secondary | ICD-10-CM | POA: Diagnosis not present

## 2018-02-04 DIAGNOSIS — R300 Dysuria: Secondary | ICD-10-CM | POA: Diagnosis not present

## 2018-02-04 DIAGNOSIS — Z125 Encounter for screening for malignant neoplasm of prostate: Secondary | ICD-10-CM | POA: Diagnosis not present

## 2018-02-04 DIAGNOSIS — G894 Chronic pain syndrome: Secondary | ICD-10-CM | POA: Diagnosis not present

## 2018-02-19 DIAGNOSIS — I10 Essential (primary) hypertension: Secondary | ICD-10-CM | POA: Diagnosis not present

## 2018-02-19 DIAGNOSIS — Z6825 Body mass index (BMI) 25.0-25.9, adult: Secondary | ICD-10-CM | POA: Diagnosis not present

## 2018-02-19 DIAGNOSIS — E663 Overweight: Secondary | ICD-10-CM | POA: Diagnosis not present

## 2018-02-19 DIAGNOSIS — R5383 Other fatigue: Secondary | ICD-10-CM | POA: Diagnosis not present

## 2018-02-19 DIAGNOSIS — I2510 Atherosclerotic heart disease of native coronary artery without angina pectoris: Secondary | ICD-10-CM | POA: Diagnosis not present

## 2018-02-19 DIAGNOSIS — E538 Deficiency of other specified B group vitamins: Secondary | ICD-10-CM | POA: Diagnosis not present

## 2018-02-19 DIAGNOSIS — K219 Gastro-esophageal reflux disease without esophagitis: Secondary | ICD-10-CM | POA: Diagnosis not present

## 2018-02-19 DIAGNOSIS — D729 Disorder of white blood cells, unspecified: Secondary | ICD-10-CM | POA: Diagnosis not present

## 2018-02-19 DIAGNOSIS — Z0001 Encounter for general adult medical examination with abnormal findings: Secondary | ICD-10-CM | POA: Diagnosis not present

## 2018-02-24 DIAGNOSIS — Z6825 Body mass index (BMI) 25.0-25.9, adult: Secondary | ICD-10-CM | POA: Diagnosis not present

## 2018-02-24 DIAGNOSIS — D519 Vitamin B12 deficiency anemia, unspecified: Secondary | ICD-10-CM | POA: Diagnosis not present

## 2018-02-24 DIAGNOSIS — Z125 Encounter for screening for malignant neoplasm of prostate: Secondary | ICD-10-CM | POA: Diagnosis not present

## 2018-02-24 DIAGNOSIS — E663 Overweight: Secondary | ICD-10-CM | POA: Diagnosis not present

## 2018-02-24 DIAGNOSIS — R946 Abnormal results of thyroid function studies: Secondary | ICD-10-CM | POA: Diagnosis not present

## 2018-03-08 ENCOUNTER — Other Ambulatory Visit (HOSPITAL_COMMUNITY): Payer: Self-pay | Admitting: *Deleted

## 2018-03-08 DIAGNOSIS — C3490 Malignant neoplasm of unspecified part of unspecified bronchus or lung: Secondary | ICD-10-CM

## 2018-03-08 DIAGNOSIS — R5383 Other fatigue: Secondary | ICD-10-CM | POA: Diagnosis not present

## 2018-03-08 DIAGNOSIS — E0590 Thyrotoxicosis, unspecified without thyrotoxic crisis or storm: Secondary | ICD-10-CM | POA: Diagnosis not present

## 2018-03-08 DIAGNOSIS — R947 Abnormal results of other endocrine function studies: Secondary | ICD-10-CM | POA: Diagnosis not present

## 2018-03-12 ENCOUNTER — Inpatient Hospital Stay (HOSPITAL_BASED_OUTPATIENT_CLINIC_OR_DEPARTMENT_OTHER): Payer: Medicare Other | Admitting: Hematology

## 2018-03-12 ENCOUNTER — Inpatient Hospital Stay (HOSPITAL_COMMUNITY): Payer: Medicare Other | Attending: Hematology

## 2018-03-12 ENCOUNTER — Encounter (HOSPITAL_COMMUNITY): Payer: Self-pay | Admitting: Hematology

## 2018-03-12 VITALS — BP 159/68 | HR 80 | Temp 97.6°F | Resp 16 | Wt 158.5 lb

## 2018-03-12 DIAGNOSIS — D508 Other iron deficiency anemias: Secondary | ICD-10-CM | POA: Diagnosis not present

## 2018-03-12 DIAGNOSIS — C3432 Malignant neoplasm of lower lobe, left bronchus or lung: Secondary | ICD-10-CM | POA: Insufficient documentation

## 2018-03-12 DIAGNOSIS — F17211 Nicotine dependence, cigarettes, in remission: Secondary | ICD-10-CM

## 2018-03-12 DIAGNOSIS — Z923 Personal history of irradiation: Secondary | ICD-10-CM

## 2018-03-12 DIAGNOSIS — C3490 Malignant neoplasm of unspecified part of unspecified bronchus or lung: Secondary | ICD-10-CM

## 2018-03-12 DIAGNOSIS — Z85118 Personal history of other malignant neoplasm of bronchus and lung: Secondary | ICD-10-CM | POA: Insufficient documentation

## 2018-03-12 LAB — COMPREHENSIVE METABOLIC PANEL
ALK PHOS: 71 U/L (ref 38–126)
ALT: 14 U/L (ref 0–44)
AST: 13 U/L — ABNORMAL LOW (ref 15–41)
Albumin: 3.7 g/dL (ref 3.5–5.0)
Anion gap: 8 (ref 5–15)
BILIRUBIN TOTAL: 0.4 mg/dL (ref 0.3–1.2)
BUN: 25 mg/dL — AB (ref 8–23)
CALCIUM: 9 mg/dL (ref 8.9–10.3)
CO2: 28 mmol/L (ref 22–32)
Chloride: 103 mmol/L (ref 98–111)
Creatinine, Ser: 1.23 mg/dL (ref 0.61–1.24)
GFR calc Af Amer: >60 mL/min (ref >60)
GFR calc non Af Amer: 58 mL/min — ABNORMAL LOW (ref >60)
GLUCOSE: 125 mg/dL — AB (ref 70–99)
Potassium: 4.9 mmol/L (ref 3.5–5.1)
SODIUM: 139 mmol/L (ref 135–145)
TOTAL PROTEIN: 6.9 g/dL (ref 6.5–8.1)

## 2018-03-12 LAB — CBC WITH DIFFERENTIAL/PLATELET
BASOS PCT: 1 %
Basophils Absolute: 0 10*3/uL (ref 0.0–0.1)
EOS ABS: 0.3 10*3/uL (ref 0.0–0.7)
EOS PCT: 4 %
HCT: 33.9 % — ABNORMAL LOW (ref 39.0–52.0)
Hemoglobin: 10.6 g/dL — ABNORMAL LOW (ref 13.0–17.0)
LYMPHS ABS: 1.8 10*3/uL (ref 0.7–4.0)
Lymphocytes Relative: 26 %
MCH: 30.4 pg (ref 26.0–34.0)
MCHC: 31.3 g/dL (ref 30.0–36.0)
MCV: 97.1 fL (ref 78.0–100.0)
MONOS PCT: 8 %
Monocytes Absolute: 0.6 10*3/uL (ref 0.1–1.0)
Neutro Abs: 4.3 10*3/uL (ref 1.7–7.7)
Neutrophils Relative %: 61 %
PLATELETS: 344 10*3/uL (ref 150–400)
RBC: 3.49 MIL/uL — ABNORMAL LOW (ref 4.22–5.81)
RDW: 13.7 % (ref 11.5–15.5)
WBC: 7 10*3/uL (ref 4.0–10.5)

## 2018-03-12 NOTE — Patient Instructions (Signed)
[facility] Discharge Instructions  You saw Dr. Katragadda today.   Thank you for choosing [facility] to provide your oncology and hematology care.  To afford each patient quality time with our provider, please arrive at least 15 minutes before your scheduled appointment time.   If you have a lab appointment with the Cancer Center please come in thru the  Main Entrance and check in at the main information desk  You need to re-schedule your appointment should you arrive 10 or more minutes late.  We strive to give you quality time with our providers, and arriving late affects you and other patients whose appointments are after yours.  Also, if you no show three or more times for appointments you may be dismissed from the clinic at the providers discretion.     Again, thank you for choosing [facility].  Our hope is that these requests will decrease the amount of time that you wait before being seen by our physicians.       _____________________________________________________________  Should you have questions after your visit to  Cancer Center, please contact our office at [phone] between the hours of 8:30 a.m. and 4:30 p.m.  Voicemails left after 4:30 p.m. will not be returned until the following business day.  For prescription refill requests, have your pharmacy contact our office.       Resources For Cancer Patients and their Caregivers ? American Cancer Society: Can assist with transportation, wigs, general needs, runs Look Good Feel Better.        1-888-227-6333 ? Cancer Care: Provides financial assistance, online support groups, medication/co-pay assistance.  1-800-813-HOPE (4673) ? [facility] Assists [place] cancer patients and their families through emotional , educational and financial support.  336-427-4357 ? [place] DSS Where to apply for  food stamps, Medicaid and utility assistance. 336-342-1394 ? [facility]: Transportation to medical appointments. 336-347-2287 ? Social Security Administration: May apply for disability if have a Stage IV cancer. 336-342-7796 1-800-772-1213 ? [place] [facility]ging, Disability and Transit Services: Assists with nutrition, care and transit needs. 336-349-2343  Cancer Center Support Programs:   > Cancer Support Group  2nd Tuesday of the month 1pm-2pm, Journey Room   > Creative Journey  3rd Tuesday of the month 1130am-1pm, Journey Room     

## 2018-03-12 NOTE — Assessment & Plan Note (Signed)
1.  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 Isidore Moos in 2015. -I have reviewed the results of CT scan of the chest from January 2019 which shows treatment related changes with no evidence of recurrence.  He has an appointment to see Dr. Isidore Moos in 2 weeks.  He would like to follow-up with one doctor.  Hence I did not give him any follow-up visits.  2.  Right lung cancer: -Status post right upper lobectomy on 10/15/2010, 1.6 cm, negative margins, 0 out of 4 positive lymph nodes.  He continues to be in remission.  3.  Normocytic anemia: -Labs done today showed hemoglobin dropped to around 10.  Previously she is to be 13.  We will check ferritin, iron panel, folic acid and V74.  He apparently received B12 injection few days ago from his PCP.  His creatinine has also gone up to 1.32 for the first time.

## 2018-03-12 NOTE — Progress Notes (Signed)
[place], [place] [address]   CLINIC:  Medical Oncology/Hematology  PCP:  [facility], [place]  [id number] [phone]   REASON FOR VISIT:  Follow-up for lung cancer.  CURRENT THERAPY: Surveillance.  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 Arlyce Dice      10/16/2010 Remission         10/24/2013 Imaging    CT Chest-  Enlarging subpleural nodule in the left lower lobe, now 9 mm, with distortion of the overlying pleura. Finding is concerning for a small bronchogenic carcinoma      01/23/2014 Imaging    CT Chest- Left lower lobe nodule has enlarged from 11/01/2012 and is worrisome for primary bronchogenic carcinoma.       07/04/2014 - 07/10/2014 Radiation Therapy    SBRT by Dr. Isidore Moos in 3 fractions.      07/05/2015 Imaging    perifissural nodule in LLL appears slightly smaller with parenchymal opacity attrib to XRT, likely inflamm pathcy airspace dz more inferiorly in LLL, airspace dz in RLL resolved. no metastatic disease       Primary cancer of left lower lobe of lung ([name])   06/18/2014 Initial Diagnosis    Left lower lobe nodule suspicious for primary lung carcinoma      07/04/2014 - 07/10/2014 Radiation Therapy    SBRT      10/12/2014 Imaging    No acute findings within the chest. Decrease in size of left lower lobe perifissural nodule compatible with response to therapy. No new or progressive disease identified within the chest.        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 72 y.o. male returns for follow-up of his lung cancer.  He quit smoking.  He reports worsening of his  fatigue.  No chest pains were reported.  No infections or hospitalizations recently.  Denies any bleeding per rectum or melena.  He apparently received a B12 injection from his PMD recently.   REVIEW OF SYSTEMS:  Review of Systems  Constitutional: Positive for fatigue.  Gastrointestinal: Positive for nausea.  Neurological: Positive for dizziness.  Hematological: Bruises/bleeds easily.  All other systems reviewed and are negative.    PAST MEDICAL/SURGICAL HISTORY:  Past Medical History:  Diagnosis Date  . 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 Allergy   . Anxiety   . Arthritis    finger  . Back fracture    DUE TO AA IN 1970  . Chronic back pain   . 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 Essential hypertension 05/06/2011  . GERD (gastroesophageal reflux disease)   . High cholesterol   . High coronary artery calcium score Julye 2018   Agaston Score 1043. dense RCA calcification --Non-ischemic Myoview  . History of radiation therapy 07/04/14, 07/06/14, 07/10/14   LLL lung nodule/54 Gy/3 fx- SBRT  . Hypercholesterolemia 05/06/2011  . Pneumonia due to Streptococcus    HX. POST OPERATIVELY  . Stress fx pelvis    DUE TO AA IN 1970   Past Surgical History:  Procedure  Laterality Date  . BACK SURGERY  07/04/2017   fusion above previous surgery  . BACK SURGERY  2011   metal plate l-4,L5  . Coroanry Calcium Score  03/2017   1043. Noted especially in RCA. Dense calcification, recommend Myoview over Cor CTA.  Marland Kitchen LIPOMA EXCISION N/A 10/14/2017   Procedure: EXCISION LIPOMA ON BACK;  Surgeon: Virl Cagey, MD;  Location: [facility];  Service: General;  Laterality: N/A;  . LUNG LOBECTOMY  2012   RT UPPER LOBE  . NM MYOVIEW LTD  04/2017   EF 60%. Hypertensive response to exercise. (6:21 min, 7.7 METs) No EKG changes. LOW RISK     SOCIAL HISTORY:  Social History   Socioeconomic History  . Marital status:  Married    Spouse name: Not on file  . Number of children: 3  . Years of education: Not on file  . Highest education level: Not on file  Occupational History  . Not on file  Social Needs  . Financial resource strain: Not on file  . Food insecurity:    Worry: Not on file    Inability: Not on file  . Transportation needs:    Medical: Not on file    Non-medical: Not on file  Tobacco Use  . Smoking status: Former Smoker    Types: Cigarettes    Last attempt to quit: 09/08/2010    Years since quitting: 7.5  . Smokeless tobacco: Current User    Types: Chew  . Tobacco comment: still chews tobacco  Substance and Sexual Activity  . Alcohol use: No  . Drug use: No  . Sexual activity: Not Currently  Lifestyle  . Physical activity:    Days per week: Not on file    Minutes per session: Not on file  . Stress: Not on file  Relationships  . Social connections:    Talks on phone: Not on file    Gets together: Not on file    Attends religious service: Not on file    Active member of club or organization: Not on file    Attends meetings of clubs or organizations: Not on file    Relationship status: Not on file  . Intimate partner violence:    Fear of current or ex partner: Not on file    Emotionally abused: Not on file    Physically abused: Not on file    Forced sexual activity: Not on file  Other Topics Concern  . Not on file  Social History Narrative  . Not on file    FAMILY HISTORY:  Family History  Problem Relation Age of Onset  . Hypertension Mother   . Kidney disease Father   . Heart attack Brother   . Cancer Maternal Uncle        prostate cancer  . Cancer Maternal Uncle        prostate cancer  . Cancer Maternal Uncle        prostate cancer  . Cancer Maternal Uncle        prostate cancer    CURRENT MEDICATIONS:  Outpatient Encounter Medications as of 03/12/2018  Medication Sig  . acetaminophen (TYLENOL) 500 MG tablet Take 1,000 mg by mouth every 6 (six) hours as  needed for moderate pain or headache.  . albuterol (PROVENTIL HFA;VENTOLIN HFA) 108 (90 BASE) MCG/ACT inhaler Inhale 2 puffs into the lungs every 6 (six) hours as needed for wheezing or shortness of breath.   . ALPRAZolam (XANAX) 0.5 MG tablet Take  0.5 mg by mouth at bedtime.   . celecoxib (CELEBREX) 200 MG capsule   . cimetidine (TAGAMET) 200 MG tablet Take 400 mg by mouth daily as needed (for heartburn).  . Cyanocobalamin (VITAMIN B-12 PO) Take 1 tablet by mouth daily.  . fluticasone (FLONASE) 50 MCG/ACT nasal spray Place 2 sprays into both nostrils daily as needed for allergies.   . hydrochlorothiazide 25 MG tablet Take 12.5 mg by mouth daily.   Marland Kitchen HYDROcodone-acetaminophen (NORCO) 10-325 MG per tablet Take 1 tablet by mouth every 6 (six) hours as needed for moderate pain.   . indomethacin (INDOCIN) 50 MG capsule   . Menthol-Methyl Salicylate (MUSCLE RUB EX) Apply 1 application topically daily as needed (for pain).  . Omega-3 Fatty Acids (FISH OIL PO) Take 1 capsule by mouth daily.  . pantoprazole (PROTONIX) 40 MG tablet Take 40 mg by mouth daily.  . simvastatin (ZOCOR) 40 MG tablet Take 40 mg by mouth every evening.    No facility-administered encounter medications on file as of 03/12/2018.     ALLERGIES:  Allergies  Allergen Reactions  . Fentanyl Other (See Comments)    CAUSED HALLUCINATIONS/05-03-13 pt reports it was oral fentanyl that caused hallucinations  . Gabapentin Other (See Comments)    Bones throbbed, headaches, weakness  . Aspirin Nausea Only  . Celecoxib Rash     PHYSICAL EXAM:  ECOG Performance status: 1  Vitals:   03/12/18 0935  BP: (!) 159/68  Pulse: 80  Resp: 16  Temp: 97.6 F (36.4 C)  SpO2: 100%   Filed Weights   03/12/18 0935  Weight: 158 lb 8 oz (71.9 kg)    Physical Exam HEENT: Oropharynx has no thrush. Chest: Bilaterally clear to auscultation. CVS: S1-S2 regular rate and rhythm. Abdomen: Soft nontender, no hepatospleno megaly. Extremities: No  edema or cyanosis.  LABORATORY DATA:  I have reviewed the labs as listed.  CBC    Component Value Date/Time   WBC 7.0 03/12/2018 0859   RBC 3.49 (L) 03/12/2018 0859   HGB 10.6 (L) 03/12/2018 0859   HCT 33.9 (L) 03/12/2018 0859   PLT 344 03/12/2018 0859   MCV 97.1 03/12/2018 0859   MCH 30.4 03/12/2018 0859   MCHC 31.3 03/12/2018 0859   RDW 13.7 03/12/2018 0859   LYMPHSABS 1.8 03/12/2018 0859   MONOABS 0.6 03/12/2018 0859   EOSABS 0.3 03/12/2018 0859   BASOSABS 0.0 03/12/2018 0859   CMP Latest Ref Rng & Units 03/12/2018 10/09/2017 03/13/2017  Glucose 70 - 99 mg/dL 125(H) 134(H) 110(H)  BUN 8 - 23 mg/dL 25(H) 20 13  Creatinine 0.61 - 1.24 mg/dL 1.23 0.74 0.83  Sodium 135 - 145 mmol/L 139 134(L) 135  Potassium 3.5 - 5.1 mmol/L 4.9 3.6 3.5  Chloride 98 - 111 mmol/L 103 98(L) 97(L)  CO2 22 - 32 mmol/L 28 25 29   Calcium 8.9 - 10.3 mg/dL 9.0 8.8(L) 8.7(L)  Total Protein 6.5 - 8.1 g/dL 6.9 - 6.8  Total Bilirubin 0.3 - 1.2 mg/dL 0.4 - 0.6  Alkaline Phos 38 - 126 U/L 71 - 67  AST 15 - 41 U/L 13(L) - 19  ALT 0 - 44 U/L 14 - 18       DIAGNOSTIC IMAGING:  I have independently reviewed his CT scan of the chest from January 2019.  I discussed the results with the patient.     ASSESSMENT & PLAN:   Adenocarcinoma of lung 1.  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 Isidore Moos in 2015. -I have reviewed the results of CT scan of the chest from January 2019 which shows treatment related changes with no evidence of recurrence.  He has an appointment to see Dr. Isidore Moos in 2 weeks.  He would like to follow-up with one doctor.  Hence I did not give him any follow-up visits.  2.  Right lung cancer: -Status post right upper lobectomy on 10/15/2010, 1.6 cm, negative margins, 0 out of 4 positive lymph nodes.  He continues to be in remission.  3.  Normocytic anemia: -Labs done today showed hemoglobin dropped to around 10.  Previously she is to be 12.  We will check  ferritin, iron panel, folic acid and K44.  He apparently received B12 injection few days ago from his PCP.  His creatinine has also gone up to 1.32 for the first time.      Orders placed this encounter:  Orders Placed This Encounter  Procedures  . Ferritin  . Iron and TIBC  . Folate  . Vitamin B12      Derek Jack, [place] [phone]

## 2018-03-13 LAB — IRON AND TIBC
Iron: 69 ug/dL (ref 45–182)
SATURATION RATIOS: 17 % — AB (ref 17.9–39.5)
TIBC: 413 ug/dL (ref 250–450)
UIBC: 344 ug/dL

## 2018-03-13 LAB — FOLATE: Folate: 8.7 ng/mL (ref >5.9)

## 2018-03-13 LAB — VITAMIN B12: Vitamin B-12: 498 pg/mL (ref 180–914)

## 2018-03-13 LAB — FERRITIN: FERRITIN: 16 ng/mL — AB (ref 24–336)

## 2018-03-15 ENCOUNTER — Other Ambulatory Visit (HOSPITAL_COMMUNITY): Payer: Self-pay | Admitting: Nurse Practitioner

## 2018-03-15 ENCOUNTER — Telehealth (HOSPITAL_COMMUNITY): Payer: Self-pay | Admitting: Nurse Practitioner

## 2018-03-15 DIAGNOSIS — D508 Other iron deficiency anemias: Secondary | ICD-10-CM

## 2018-03-15 DIAGNOSIS — C3490 Malignant neoplasm of unspecified part of unspecified bronchus or lung: Secondary | ICD-10-CM

## 2018-03-15 DIAGNOSIS — D509 Iron deficiency anemia, unspecified: Secondary | ICD-10-CM | POA: Insufficient documentation

## 2018-03-16 NOTE — Progress Notes (Signed)
Maurice Orozco presents for follow up of radiation completed 07/04/14, 07/06/14, 07/10/14, to his Left lower lung. He reports back pain and right side pain which he rates a 6/10. He is taking pain medicine regularly. He reports shortness of breath but relates it to the hot weather. He reports a chronic sinus drainage. He is taking IV iron which has improved his energy level. He is here for the results of a CT Chest 03/23/18.  BP (!) 143/69 (BP Location: Left Arm, Patient Position: Sitting, Cuff Size: Normal)   Pulse 89   Temp 98 F (36.7 C) (Oral)   Resp 16   Ht 5\' 6"  (1.676 m)   Wt 152 lb 12.8 oz (69.3 kg)   SpO2 99%   BMI 24.66 kg/m    Wt Readings from Last 3 Encounters:  03/26/18 152 lb 12.8 oz (69.3 kg)  03/12/18 158 lb 8 oz (71.9 kg)  10/27/17 160 lb (72.6 kg)

## 2018-03-17 ENCOUNTER — Telehealth: Payer: Self-pay | Admitting: Radiation Oncology

## 2018-03-17 NOTE — Telephone Encounter (Signed)
Confirmed appt with patient on CT scan at [facility] on 7/16 and f/u with Dr. Isidore Moos on 7/19. Pt agreeable.

## 2018-03-18 ENCOUNTER — Inpatient Hospital Stay (HOSPITAL_COMMUNITY): Payer: Medicare Other

## 2018-03-18 ENCOUNTER — Encounter (HOSPITAL_COMMUNITY): Payer: Self-pay

## 2018-03-18 ENCOUNTER — Other Ambulatory Visit: Payer: Self-pay

## 2018-03-18 VITALS — BP 114/58 | HR 86 | Temp 98.6°F | Resp 16

## 2018-03-18 DIAGNOSIS — Z85118 Personal history of other malignant neoplasm of bronchus and lung: Secondary | ICD-10-CM | POA: Diagnosis not present

## 2018-03-18 DIAGNOSIS — C3432 Malignant neoplasm of lower lobe, left bronchus or lung: Secondary | ICD-10-CM | POA: Diagnosis not present

## 2018-03-18 DIAGNOSIS — F17211 Nicotine dependence, cigarettes, in remission: Secondary | ICD-10-CM | POA: Diagnosis not present

## 2018-03-18 DIAGNOSIS — Z923 Personal history of irradiation: Secondary | ICD-10-CM | POA: Diagnosis not present

## 2018-03-18 DIAGNOSIS — D508 Other iron deficiency anemias: Secondary | ICD-10-CM | POA: Diagnosis not present

## 2018-03-18 MED ORDER — SODIUM CHLORIDE 0.9 % IV SOLN
510.0000 mg | Freq: Once | INTRAVENOUS | Status: AC
  Administered 2018-03-18: 510 mg via INTRAVENOUS
  Filled 2018-03-18: qty 17

## 2018-03-18 MED ORDER — SODIUM CHLORIDE 0.9 % IV SOLN
Freq: Once | INTRAVENOUS | Status: AC
  Administered 2018-03-18: 12:00:00 via INTRAVENOUS

## 2018-03-18 NOTE — Progress Notes (Signed)
Feraheme given per orders. Patient tolerated it well without problems. Vitals stable and discharged home from clinic ambulatory. Follow up as scheduled.  

## 2018-03-18 NOTE — Patient Instructions (Signed)
[facility] at [facility] Discharge Instructions  Feraheme given today. Follow up as scheduled.   Thank you for choosing [facility] at [facility] to provide your oncology and hematology care.  To afford each patient quality time with our provider, please arrive at least 15 minutes before your scheduled appointment time.   If you have a lab appointment with the Cancer Center please come in thru the  Main Entrance and check in at the main information desk  You need to re-schedule your appointment should you arrive 10 or more minutes late.  We strive to give you quality time with our providers, and arriving late affects you and other patients whose appointments are after yours.  Also, if you no show three or more times for appointments you may be dismissed from the clinic at the providers discretion.     Again, thank you for choosing [facility].  Our hope is that these requests will decrease the amount of time that you wait before being seen by our physicians.       _____________________________________________________________  Should you have questions after your visit to  Cancer Center, please contact our office at [phone] between the hours of 8:30 a.m. and 4:30 p.m.  Voicemails left after 4:30 p.m. will not be returned until the following business day.  For prescription refill requests, have your pharmacy contact our office.       Resources For Cancer Patients and their Caregivers ? American Cancer Society: Can assist with transportation, wigs, general needs, runs Look Good Feel Better.        1-888-227-6333 ? Cancer Care: Provides financial assistance, online support groups, medication/co-pay assistance.  1-800-813-HOPE (4673) ? [facility] Assists [place] cancer patients and their families through emotional , educational and financial support.  336-427-4357 ? [place]  DSS Where to apply for food stamps, Medicaid and utility assistance. 336-342-1394 ? [facility]: Transportation to medical appointments. 336-347-2287 ? Social Security Administration: May apply for disability if have a Stage IV cancer. 336-342-7796 1-800-772-1213 ? [place] Aging, Disability and Transit Services: Assists with nutrition, care and transit needs. 336-349-2343  Cancer Center Support Programs:   > Cancer Support Group  2nd Tuesday of the month 1pm-2pm, Journey Room   > Creative Journey  3rd Tuesday of the month 1130am-1pm, Journey Room    

## 2018-03-23 ENCOUNTER — Ambulatory Visit (HOSPITAL_COMMUNITY)
Admission: RE | Admit: 2018-03-23 | Discharge: 2018-03-23 | Disposition: A | Discharge status: Home / Self Care | Payer: Medicare Other | Source: Ambulatory Visit | Attending: Radiation Oncology | Admitting: Radiation Oncology

## 2018-03-23 DIAGNOSIS — R918 Other nonspecific abnormal finding of lung field: Secondary | ICD-10-CM | POA: Diagnosis not present

## 2018-03-23 DIAGNOSIS — I700 Atherosclerosis of aorta: Secondary | ICD-10-CM | POA: Diagnosis not present

## 2018-03-23 DIAGNOSIS — J439 Emphysema, unspecified: Secondary | ICD-10-CM | POA: Diagnosis not present

## 2018-03-25 ENCOUNTER — Inpatient Hospital Stay (HOSPITAL_COMMUNITY): Payer: Medicare Other

## 2018-03-25 ENCOUNTER — Encounter (HOSPITAL_COMMUNITY): Payer: Self-pay

## 2018-03-25 VITALS — BP 120/55 | HR 78 | Temp 98.0°F | Resp 16

## 2018-03-25 DIAGNOSIS — Z923 Personal history of irradiation: Secondary | ICD-10-CM | POA: Diagnosis not present

## 2018-03-25 DIAGNOSIS — C3432 Malignant neoplasm of lower lobe, left bronchus or lung: Secondary | ICD-10-CM | POA: Diagnosis not present

## 2018-03-25 DIAGNOSIS — Z85118 Personal history of other malignant neoplasm of bronchus and lung: Secondary | ICD-10-CM | POA: Diagnosis not present

## 2018-03-25 DIAGNOSIS — D508 Other iron deficiency anemias: Secondary | ICD-10-CM | POA: Diagnosis not present

## 2018-03-25 DIAGNOSIS — F17211 Nicotine dependence, cigarettes, in remission: Secondary | ICD-10-CM | POA: Diagnosis not present

## 2018-03-25 MED ORDER — SODIUM CHLORIDE 0.9 % IV SOLN
510.0000 mg | Freq: Once | INTRAVENOUS | Status: AC
  Administered 2018-03-25: 510 mg via INTRAVENOUS
  Filled 2018-03-25: qty 17

## 2018-03-25 MED ORDER — SODIUM CHLORIDE 0.9 % IV SOLN
Freq: Once | INTRAVENOUS | Status: AC
  Administered 2018-03-25: 500 mL via INTRAVENOUS

## 2018-03-25 MED ORDER — SODIUM CHLORIDE 0.9% FLUSH
10.0000 mL | INTRAVENOUS | Status: DC | PRN
  Administered 2018-03-25: 10 mL
  Filled 2018-03-25: qty 10

## 2018-03-25 NOTE — Patient Instructions (Signed)
[facility] at [facility]  Discharge Instructions:  Today you received your Feraheme infusion. Follow up as scheduled. Call clinic for any questions or concerns. _______________________________________________________________  Thank you for choosing [place] at [facility] to provide your oncology and hematology care.  To afford each patient quality time with our providers, please arrive at least 15 minutes before your scheduled appointment.  You need to re-schedule your appointment if you arrive 10 or more minutes late.  We strive to give you quality time with our providers, and arriving late affects you and other patients whose appointments are after yours.  Also, if you no show three or more times for appointments you may be dismissed from the clinic.  Again, thank you for choosing [place] hope is that these requests will allow you access to exceptional care and in a timely manner. _______________________________________________________________  If you have questions after your visit, please contact our office at [phone] between the hours of 8:30 a.m. and 5:00 p.m. Voicemails left after 4:30 p.m. will not be returned until the following business day. _______________________________________________________________  For prescription refill requests, have your pharmacy contact our office. _______________________________________________________________  Recommendations made by the consultant and any test results will be sent to your referring physician. _______________________________________________________________

## 2018-03-25 NOTE — Progress Notes (Signed)
Tees Toh tolerated feraheme infusion well without incident or complaints. VSS upon completion of treatment. Discharged self ambulatory in company of wife.

## 2018-03-26 ENCOUNTER — Encounter: Payer: Self-pay | Admitting: Radiation Oncology

## 2018-03-26 ENCOUNTER — Ambulatory Visit
Admission: RE | Admit: 2018-03-26 | Discharge: 2018-03-26 | Disposition: A | Discharge status: Home / Self Care | Payer: Medicare Other | Source: Ambulatory Visit | Attending: Radiation Oncology | Admitting: Radiation Oncology

## 2018-03-26 ENCOUNTER — Other Ambulatory Visit: Payer: Self-pay

## 2018-03-26 VITALS — BP 143/69 | HR 89 | Temp 98.0°F | Resp 16 | Ht 66.0 in | Wt 152.8 lb

## 2018-03-26 DIAGNOSIS — Z888 Allergy status to other drugs, medicaments and biological substances status: Secondary | ICD-10-CM | POA: Insufficient documentation

## 2018-03-26 DIAGNOSIS — Z08 Encounter for follow-up examination after completed treatment for malignant neoplasm: Secondary | ICD-10-CM | POA: Diagnosis not present

## 2018-03-26 DIAGNOSIS — Z923 Personal history of irradiation: Secondary | ICD-10-CM | POA: Insufficient documentation

## 2018-03-26 DIAGNOSIS — Z885 Allergy status to narcotic agent status: Secondary | ICD-10-CM | POA: Diagnosis not present

## 2018-03-26 DIAGNOSIS — F17220 Nicotine dependence, chewing tobacco, uncomplicated: Secondary | ICD-10-CM | POA: Diagnosis not present

## 2018-03-26 DIAGNOSIS — C3432 Malignant neoplasm of lower lobe, left bronchus or lung: Secondary | ICD-10-CM | POA: Diagnosis not present

## 2018-03-26 DIAGNOSIS — Z720 Tobacco use: Secondary | ICD-10-CM | POA: Insufficient documentation

## 2018-03-26 DIAGNOSIS — Z79899 Other long term (current) drug therapy: Secondary | ICD-10-CM | POA: Diagnosis not present

## 2018-03-26 DIAGNOSIS — Z902 Acquired absence of lung [part of]: Secondary | ICD-10-CM | POA: Diagnosis not present

## 2018-03-26 DIAGNOSIS — Z886 Allergy status to analgesic agent status: Secondary | ICD-10-CM | POA: Diagnosis not present

## 2018-03-26 DIAGNOSIS — R918 Other nonspecific abnormal finding of lung field: Secondary | ICD-10-CM | POA: Diagnosis not present

## 2018-03-26 DIAGNOSIS — Z85118 Personal history of other malignant neoplasm of bronchus and lung: Secondary | ICD-10-CM | POA: Diagnosis not present

## 2018-03-26 NOTE — Progress Notes (Signed)
Radiation Oncology         [phone] ________________________________  Name: Maurice Orozco MRN: [id number]  Date: 03/26/2018  DOB: Sep 16, 1945  Follow-Up Visit Note  Outpatient  CC: Maurice School, MD  Farrel Gobble, MD  Diagnosis and Prior Radiotherapy:    ICD-10-CM   1. Opacity of lung on imaging study R91.8 Ambulatory referral to Pulmonology  2. Primary cancer of left lower lobe of lung (Maurice Orozco) C34.32 Ambulatory referral to Pulmonology    Left lower lobe nodule suspicious for primary lung carcinoma, T1aN0M0 Stage IA 07/04/2014, 07/06/2014, 07/10/2014: 54 Gy directed to the left lower lung in 3 fractions.  CHIEF COMPLAINT: Here for follow-up and surveillance of lung cancer  Narrative: Maurice Orozco presents for follow up of radiation completed 07/10/2014 to his left lower lung.  He underwent RUL lobectomy on 10/15/10 by Dr Maurice Orozco.  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  No adjuvant therapy.  He is a former smoker, currently chews tobacco.    I treated a LLL nodule with SBRT in 2015.  He has done well since.  Since our last visit the patient underwent a CT chest without contrast on 03/23/2018. These scans show new extensive airspace process involving the right hemithorax. Although this could represent aggressive recurrent neoplasm with interstitial and endobronchial spread, it is thought to be more likely an infectious process. Correlation with clinical findings recommended. Bronchoscopy may be indicated versus an aggressive course of antibiotics followed by a short term follow up CT scan (4-6 weeks). There are new and enlarging mediastinal lymph nodes which could either be metastatic or inflammatory. Stable left lower lobe lung lesion with surrounding radiation changes. Suspect inflammatory or mild atypical infectious process in the left lung with a tree-in-bud pattern. No findings for upper abdominal metastatic disease. I  reviewed the images   On the review of symptoms, he reports he reports chronic back pain and right side pain which he rates a 6/10 on pain scale. He is taking pain medicine regularly. He reports stable shortness of breath but relates it to the hot weather. He reports chronic sinus drainage and cough associated with this. States this cough is upper chest and no different than in the past. He is taking IV iron which has improved his energy level.   ALLERGIES:  is allergic to fentanyl; gabapentin; aspirin; and celecoxib.  Meds: Current Outpatient Medications  Medication Sig Dispense Refill  . acetaminophen (TYLENOL) 500 MG tablet Take 1,000 mg by mouth every 6 (six) hours as needed for moderate pain or headache.    . albuterol (PROVENTIL HFA;VENTOLIN HFA) 108 (90 BASE) MCG/ACT inhaler Inhale 2 puffs into the lungs every 6 (six) hours as needed for wheezing or shortness of breath.     . ALPRAZolam (XANAX) 0.5 MG tablet Take 0.5 mg by mouth at bedtime.     . celecoxib (CELEBREX) 200 MG capsule     . cimetidine (TAGAMET) 200 MG tablet Take 400 mg by mouth daily as needed (for heartburn).    . Cyanocobalamin (VITAMIN B-12 PO) Take 1 tablet by mouth daily.    . fluticasone (FLONASE) 50 MCG/ACT nasal spray Place 2 sprays into both nostrils daily as needed for allergies.     . hydrochlorothiazide 25 MG tablet Take 12.5 mg by mouth daily.     Marland Kitchen HYDROcodone-acetaminophen (NORCO) 10-325 MG per tablet Take 1 tablet by mouth every 6 (six) hours as needed for moderate pain.     Marland Kitchen  indomethacin (INDOCIN) 50 MG capsule     . Menthol-Methyl Salicylate (MUSCLE RUB EX) Apply 1 application topically daily as needed (for pain).    . Omega-3 Fatty Acids (FISH OIL PO) Take 1 capsule by mouth daily.    . pantoprazole (PROTONIX) 40 MG tablet Take 40 mg by mouth daily.    . simvastatin (ZOCOR) 40 MG tablet Take 40 mg by mouth every evening.      No current facility-administered medications for this encounter.      Physical Findings:  height is _0  (1.676 m) and weight is 152 lb 12.8 oz (69.3 kg). His oral temperature is 98 F (36.7 C). His blood pressure is 143/69 (abnormal) and his pulse is 89. His respiration is 16 and oxygen saturation is 99%.    General: Alert and oriented, in no acute distress. Accompanied by his wife today. HEENT: Head is normocephalic. Extraocular movements are intact.  Neck: No adenopathy present. Heart: Regular in rate and rhythm with no murmurs, rubs, or gallops. Chest: Clear to auscultation bilaterally, with no rhonchi, wheezes, or rales. Lymphatics: see Neck Exam Skin: No concerning lesions. Neurologic: Cranial nerves II through XII are grossly intact. No obvious focalities. Speech is fluent. Coordination is intact. Psychiatric: Judgment and insight are intact. Affect is appropriate.  Lab Findings: Lab Results  Component Value Date   WBC 7.0 03/12/2018   HGB 10.6 (L) 03/12/2018   HCT 33.9 (L) 03/12/2018   MCV 97.1 03/12/2018   PLT 344 03/12/2018    Radiographic Findings: Ct Chest Wo Contrast  Result Date: 03/23/2018 CLINICAL DATA:  History of right upper lobe lobectomy for lung cancer. History of SP RT for left lower lobe lung lesion. Restaging. EXAM: CT CHEST WITHOUT CONTRAST TECHNIQUE: Multidetector CT imaging of the chest was performed following the standard protocol without IV contrast. COMPARISON:  Multiple prior chest CTs. The most recent is 09/24/2017. FINDINGS: Cardiovascular: The heart is normal in size and stable. No pericardial effusion. Stable tortuosity and calcification of the thoracic aorta. Stable coronary artery calcifications. Mediastinum/Nodes: 13.5 mm precarinal lymph node on image number 64 previously measured 8 mm. New subcarinal lymph node on image number 84 measures 14 mm and adjacent right-sided lymph node on image 83 measures 15 mm. Stable hiatal hernia. Lungs/Pleura: New extensive nodular airspace process involving the right hemithorax.  There are multiple ill-defined nodular lesions along with dense areas of airspace consolidation and marked interstitial thickening. Could not exclude the possibility of recurrent cancer with interstitial and peribronchial spread but given the rapid development since the prior CT scan I would favor this being some type of infectious process, possible aspiration. I would expect the patient to be symptomatic either way but recommend correlation with white blood cell count, fever, cough, etc. Options would include bronchoscopy or aggressive course of antibiotics followed by a follow-up chest CT in 4-6 weeks. Stable radiation changes involving the left lung. The perifissural lesion is stable on image number 88 at 8.5 mm. 7 mm ill-defined nodule in the left upper lobe on image number 68 is new. More peripheral areas of tree-in-bud findings suggesting an inflammatory or atypical infectious process. Upper Abdomen: No significant upper abdominal findings. No obvious hepatic or adrenal gland lesions. Musculoskeletal: No chest wall mass, supraclavicular or axillary adenopathy. New sub 8 mm right-sided supraclavicular nodes are noted. No significant bony findings. IMPRESSION: 1. New extensive airspace process involving the right hemithorax. Although this could represent aggressive recurrent neoplasm with interstitial and endobronchial spread I think it is  more likely an infectious process as discussed above. Would recommend correlation with clinical findings. Bronchoscopy may be indicated versus an aggressive course of antibiotics followed by a short-term followup CT scan (4-6 weeks). 2. New and enlarging mediastinal lymph nodes could either be metastatic or inflammatory. 3. Stable left lower lobe lung lesion with surrounding radiation changes. 4. Suspect inflammatory or mild atypical infectious process in the left lung with a tree-in-bud pattern. 5. No findings for upper abdominal metastatic disease. Aortic Atherosclerosis  (ICD10-I70.0) and Emphysema (ICD10-J43.9). Electronically Signed   By: Marijo Sanes M.D.   On: 03/23/2018 16:14    Impression/Plan:   [name] underwent RUL lobectomy on 10/15/10 by Dr Maurice Orozco.  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  No adjuvant therapy.  He is a former smoker, currently chews tobacco.    I treated a LLL nodule with SBRT in 2015.  Done well since.  Recent CT scan shows new extensive airspace process involving the right hemithorax. Although this could represent aggressive interstitial and endobronchial spread it is thought to more likely represent an infectious process. Bronchoscopy may be indicated versus an aggressive course of antibiotics followed by a short-term follow-up CT scan (4-6 weeks). These findings were discussed with the patient and he would prefer to move forward with an antibiotic regimen versus bronchoscopy. The patient is currently asymptomatic. The patient will be referred to a pulmonologist here in [place] the week after next, as requested by the patient to determine best course of action.   He will return for follow-up in our clinic after he has met and made a plan with pulmonology. The patient plans to call us with report of how he is doing to determine timing of his next visit with me.  I spent 25 minutes face to face with the patient and more than 50% of that time was spent in counseling and/or coordination of care. _____________________________________   Eppie Gibson, MD   This document serves as a record of services personally performed by Eppie Gibson, MD. It was created on her behalf by Maurice Harman, a trained medical scribe. The creation of this record is based on the scribe's personal observations and the provider's statements to them. This document has been checked and approved by the attending provider.

## 2018-03-29 ENCOUNTER — Telehealth: Payer: Self-pay | Admitting: *Deleted

## 2018-03-29 NOTE — Telephone Encounter (Signed)
CALLED PATIENT TO INFORM OF APPT. WITH DR. Ander Slade ON 04-08-18- ARRIVAL TIME - 3:15 PM , ADDRESS - [address], [address]. NO. [phone], LVM FOR A RETURN CALL

## 2018-04-08 ENCOUNTER — Encounter: Payer: Self-pay | Admitting: Pulmonary Disease

## 2018-04-08 ENCOUNTER — Ambulatory Visit (INDEPENDENT_AMBULATORY_CARE_PROVIDER_SITE_OTHER): Payer: Medicare Other | Admitting: Pulmonary Disease

## 2018-04-08 VITALS — BP 130/76 | HR 83 | Ht 66.0 in | Wt 151.0 lb

## 2018-04-08 DIAGNOSIS — J440 Chronic obstructive pulmonary disease with acute lower respiratory infection: Secondary | ICD-10-CM | POA: Diagnosis not present

## 2018-04-08 DIAGNOSIS — J181 Lobar pneumonia, unspecified organism: Secondary | ICD-10-CM

## 2018-04-08 MED ORDER — LEVOFLOXACIN 750 MG PO TABS: 750.0000 mg | ORAL_TABLET | Freq: Every day | ORAL | 0 refills | Status: AC

## 2018-04-08 MED ORDER — ALBUTEROL SULFATE HFA 108 (90 BASE) MCG/ACT IN AERS
2.0000 | INHALATION_SPRAY | Freq: Four times a day (QID) | RESPIRATORY_TRACT | 3 refills | Status: DC | PRN
Start: 2018-04-08 — End: 2018-04-23

## 2018-04-08 MED ORDER — BENZONATATE 100 MG PO CAPS: 100.0000 mg | ORAL_CAPSULE | Freq: Three times a day (TID) | ORAL | 1 refills | Status: DC

## 2018-04-08 NOTE — Progress Notes (Signed)
Maurice Orozco    [id number]    09-Mar-1946  Primary Care Physician:Fusco, Purcell Nails, MD  Referring Physician: Eppie Gibson, MD [address] [place], [place] [address]  Chief complaint:   Cough, shortness of breath, history of lung cancer  HPI:  Cough and shortness of breath for a few weeks duration Occasional chills No hemoptysis lung cancer in 2012 Laser treatments to his lung of recent-could not remember the date He has not lost any weight Has not been sleeping well secondary to persistent coughing Denies any chest pains or chest discomfort  Pets: Pets Occupation: No pertinent occupational history Exposures: Smoking history: Smoking in 2012 Travel history: No recent travels Relevant family history: No contributory family history  Outpatient Encounter Medications as of 04/08/2018  Medication Sig  . acetaminophen (TYLENOL) 500 MG tablet Take 1,000 mg by mouth every 6 (six) hours as needed for moderate pain or headache.  . albuterol (PROVENTIL HFA;VENTOLIN HFA) 108 (90 BASE) MCG/ACT inhaler Inhale 2 puffs into the lungs every 6 (six) hours as needed for wheezing or shortness of breath.   . ALPRAZolam (XANAX) 0.5 MG tablet Take 0.5 mg by mouth at bedtime.   . cimetidine (TAGAMET) 200 MG tablet Take 400 mg by mouth daily as needed (for heartburn).  . Cyanocobalamin (VITAMIN B-12 PO) Take 1 tablet by mouth daily.  . fluticasone (FLONASE) 50 MCG/ACT nasal spray Place 2 sprays into both nostrils daily as needed for allergies.   . hydrochlorothiazide 25 MG tablet Take 12.5 mg by mouth daily.   Marland Kitchen HYDROcodone-acetaminophen (NORCO) 10-325 MG per tablet Take 1 tablet by mouth every 6 (six) hours as needed for moderate pain.   . pantoprazole (PROTONIX) 40 MG tablet Take 40 mg by mouth daily.  . simvastatin (ZOCOR) 40 MG tablet Take 40 mg by mouth every evening.   . benzonatate (TESSALON) 100 MG capsule Take 1 capsule (100 mg total) by mouth 3 (three) times daily.  .  indomethacin (INDOCIN) 50 MG capsule   . levofloxacin (LEVAQUIN) 750 MG tablet Take 1 tablet (750 mg total) by mouth daily for 5 days.  . Menthol-Methyl Salicylate (MUSCLE RUB EX) Apply 1 application topically daily as needed (for pain).  . [DISCONTINUED] celecoxib (CELEBREX) 200 MG capsule   . [DISCONTINUED] Omega-3 Fatty Acids (FISH OIL PO) Take 1 capsule by mouth daily.   No facility-administered encounter medications on file as of 04/08/2018.     Allergies as of 04/08/2018 - Review Complete 04/08/2018  Allergen Reaction Noted  . Fentanyl Other (See Comments) 05/06/2011  . Gabapentin Other (See Comments) 08/29/2016  . Aspirin Nausea Only 05/06/2011  . Celecoxib Rash 07/07/2017    Past Medical History:  Diagnosis Date  . Adenocarcinoma of lung ([place]) 05/06/2011   rt lung/dx 2011/surg only  . Allergy   . Anxiety   . Arthritis    finger  . Back fracture    DUE TO AA IN 1970  . Chronic back pain   . COPD (chronic obstructive pulmonary disease) with emphysema ([facility]) 05/06/2011  . Essential hypertension 05/06/2011  . GERD (gastroesophageal reflux disease)   . High cholesterol   . High coronary artery calcium score Julye 2018   Agaston Score 1043. dense RCA calcification --Non-ischemic Myoview  . History of radiation therapy 07/04/14, 07/06/14, 07/10/14   LLL lung nodule/54 Gy/3 fx- SBRT  . Hypercholesterolemia 05/06/2011  . Pneumonia due to Streptococcus    HX. POST OPERATIVELY  . Stress fx pelvis  DUE TO AA IN 1970    Past Surgical History:  Procedure Laterality Date  . BACK SURGERY  07/04/2017   fusion above previous surgery  . BACK SURGERY  2011   metal plate l-4,L5  . Coroanry Calcium Score  03/2017   1043. Noted especially in RCA. Dense calcification, recommend Myoview over Cor CTA.  Marland Kitchen LIPOMA EXCISION N/A 10/14/2017   Procedure: EXCISION LIPOMA ON BACK;  Surgeon: Virl Cagey, MD;  Location: [facility];  Service: General;  Laterality: N/A;  . LUNG LOBECTOMY  2012    RT UPPER LOBE  . NM MYOVIEW LTD  04/2017   EF 60%. Hypertensive response to exercise. (6:21 min, 7.7 METs) No EKG changes. LOW RISK    Family History  Problem Relation Age of Onset  . Hypertension Mother   . Kidney disease Father   . Heart attack Brother   . Cancer Maternal Uncle        prostate cancer  . Cancer Maternal Uncle        prostate cancer  . Cancer Maternal Uncle        prostate cancer  . Cancer Maternal Uncle        prostate cancer    Social History   Socioeconomic History  . Marital status: Married    Spouse name: Not on file  . Number of children: 3  . Years of education: Not on file  . Highest education level: Not on file  Occupational History  . Not on file  Social Needs  . Financial resource strain: Not on file  . Food insecurity:    Worry: Not on file    Inability: Not on file  . Transportation needs:    Medical: Not on file    Non-medical: Not on file  Tobacco Use  . Smoking status: Former Smoker    Types: Cigarettes    Last attempt to quit: 09/08/2010    Years since quitting: 7.5  . Smokeless tobacco: Current User    Types: Chew  . Tobacco comment: still chews tobacco  Substance and Sexual Activity  . Alcohol use: No  . Drug use: No  . Sexual activity: Not Currently  Lifestyle  . Physical activity:    Days per week: Not on file    Minutes per session: Not on file  . Stress: Not on file  Relationships  . Social connections:    Talks on phone: Not on file    Gets together: Not on file    Attends religious service: Not on file    Active member of club or organization: Not on file    Attends meetings of clubs or organizations: Not on file    Relationship status: Not on file  . Intimate partner violence:    Fear of current or ex partner: Not on file    Emotionally abused: Not on file    Physically abused: Not on file    Forced sexual activity: Not on file  Other Topics Concern  . Not on file  Social History Narrative  . Not on file      Review of Systems  Constitutional: Negative.   HENT: Negative.   Eyes: Negative.   Respiratory: Positive for cough and shortness of breath.   Cardiovascular: Negative for chest pain and leg swelling.  Gastrointestinal: Negative.   Endocrine: Negative.   Genitourinary: Negative.   Musculoskeletal: Negative.   Allergic/Immunologic: Negative.   Neurological: Negative.   Hematological: Negative.   Psychiatric/Behavioral: Negative.  Vitals:   04/08/18 1522  BP: 130/76  Pulse: 83  SpO2: 96%     Physical Exam  Constitutional: He is oriented to person, place, and time. He appears well-developed and well-nourished.  HENT:  Head: Normocephalic and atraumatic.  Eyes: Pupils are equal, round, and reactive to light. Conjunctivae and EOM are normal. Right eye exhibits no discharge. Left eye exhibits no discharge.  Neck: Normal range of motion. Neck supple. No tracheal deviation present. No thyromegaly present.  Cardiovascular: Normal rate and regular rhythm. Exam reveals no friction rub.  No murmur heard. Pulmonary/Chest: Effort normal and breath sounds normal. No respiratory distress. He has no wheezes. He has no rales.  Abdominal: Bowel sounds are normal. He exhibits no distension. There is no tenderness.  Musculoskeletal: Normal range of motion. He exhibits no edema.  Neurological: He is alert and oriented to person, place, and time. He has normal reflexes. No cranial nerve deficit.  Skin: Skin is warm and dry. No erythema.     Data Reviewed: CT chest from July 2019 reviewed with the patient, compared with the CT from January 2019  Assessment:  Multilobar pneumonia COPD with exacerbation History of lung cancer History of hypertension Emphysema History of lobectomy for lung cancer-adenocarcinoma of the lung  Plan/Recommendations:  Likely an infectious process based on symptoms, based on recent CT not showing any infiltrative process at the right base We will give  him a course of antibiotics Continue bronchodilators Repeat CT in 6 weeks Obtain sputum for Gram stain and cultures Give him a course of Tessalon  Encouraged to call if any significant changes in symptoms  Follow-up in 6 weeks Though more likely an infectious process the possibility of a neoplastic process does exist, multiplicity of findings is also concerning  Sherrilyn Rist MD [facility] 04/08/2018, 4:08 PM  CC: Eppie Gibson, MD

## 2018-04-08 NOTE — Patient Instructions (Addendum)
Multilobar pneumonia  Abnormal CT scan-which we reviewed together It is less likely a cancer as opposed to a pneumonia, since CT from January did not reveal any abnormality in your right lung  Course of antibiotics-Levaquin Repeat CT scan of the chest in 6 weeks this is to follow the infiltrate to resolution  Sputum for Gram stain and cultures Course of benzonatate  See her back in the office in 6 weeks following CT

## 2018-04-09 ENCOUNTER — Telehealth: Payer: Self-pay | Admitting: Pulmonary Disease

## 2018-04-09 ENCOUNTER — Other Ambulatory Visit: Payer: Medicare Other

## 2018-04-09 DIAGNOSIS — J181 Lobar pneumonia, unspecified organism: Secondary | ICD-10-CM

## 2018-04-09 NOTE — Telephone Encounter (Signed)
Pt states [facility] lab would not accept the sputum sample  She is unsure why  I asked that they drop it off at our lab  She agreed to do this  Nothing further needed

## 2018-04-11 LAB — RESPIRATORY CULTURE OR RESPIRATORY AND SPUTUM CULTURE
MICRO NUMBER:: 90916072
RESULT: NORMAL
SPECIMEN QUALITY:: ADEQUATE

## 2018-04-12 NOTE — Telephone Encounter (Signed)
1 

## 2018-04-14 ENCOUNTER — Telehealth: Payer: Self-pay | Admitting: Pulmonary Disease

## 2018-04-14 DIAGNOSIS — J441 Chronic obstructive pulmonary disease with (acute) exacerbation: Secondary | ICD-10-CM

## 2018-04-14 DIAGNOSIS — J411 Mucopurulent chronic bronchitis: Secondary | ICD-10-CM

## 2018-04-14 MED ORDER — CLARITHROMYCIN 500 MG PO TABS: 500.0000 mg | ORAL_TABLET | Freq: Two times a day (BID) | ORAL | 0 refills | Status: DC

## 2018-04-14 NOTE — Telephone Encounter (Signed)
Please see telephone encounter from 04/14/2018 Please call patient

## 2018-04-14 NOTE — Telephone Encounter (Signed)
  We will give him a course of Biaxin 500 p.o. twice daily for 14 days--order placed  We will repeat a CT scan by next week--CT order placed, we had agreed on repeating the CT after a few weeks to follow-up the infiltrative process  I will see him back in the office after his CT--patient can be brought in next week, should be after his CT is obtained  I will plan to perform a bronchoscopy if he is still very symptomatic and his CT remains persistently abnormal

## 2018-04-14 NOTE — Telephone Encounter (Signed)
Spoke with pt's wife. She is aware of Dr. Judson Roch message. I have put in a new order for the CT as they want to have this done at [facility] instead of [place]. When they are called with the pt's CT appointment, they will make the OV with Dr. Ander Slade. Nothing further was needed.

## 2018-04-14 NOTE — Telephone Encounter (Signed)
Called spoke with patient spouse [place].  Per Tandy Gaw, patient feels like his chest has gotten more full since last ov on 8/1 >> frequency of coughing is unchanged, mucus is still clear, wheezing and chest tightness are at baseline.  Pt unable to tolerate the Tessalon as it increased his cough Pt is taking Delsym Pt finished the Levaquin on 8/5  Denies any hemoptysis, f/c/s, chest pain, head congestion or PND [facility] pharmacy Allergies  Allergen Reactions  . Fentanyl Other (See Comments)    CAUSED HALLUCINATIONS/05-03-13 pt reports it was oral fentanyl that caused hallucinations  . Gabapentin Other (See Comments)    Bones throbbed, headaches, weakness  . Aspirin Nausea Only  . Celecoxib Rash   Dr Ander Slade please advise, thank you.

## 2018-04-14 NOTE — Addendum Note (Signed)
Addended by: Desmond Dike C on: 04/14/2018 01:17 PM   Modules accepted: Orders

## 2018-04-19 ENCOUNTER — Ambulatory Visit (HOSPITAL_COMMUNITY)
Admission: RE | Admit: 2018-04-19 | Discharge: 2018-04-19 | Disposition: A | Discharge status: Home / Self Care | Payer: Medicare Other | Source: Ambulatory Visit | Attending: Pulmonary Disease | Admitting: Pulmonary Disease

## 2018-04-19 DIAGNOSIS — J439 Emphysema, unspecified: Secondary | ICD-10-CM | POA: Insufficient documentation

## 2018-04-19 DIAGNOSIS — J181 Lobar pneumonia, unspecified organism: Secondary | ICD-10-CM | POA: Diagnosis not present

## 2018-04-19 DIAGNOSIS — I700 Atherosclerosis of aorta: Secondary | ICD-10-CM | POA: Diagnosis not present

## 2018-04-19 DIAGNOSIS — R918 Other nonspecific abnormal finding of lung field: Secondary | ICD-10-CM | POA: Insufficient documentation

## 2018-04-19 DIAGNOSIS — J441 Chronic obstructive pulmonary disease with (acute) exacerbation: Secondary | ICD-10-CM | POA: Diagnosis not present

## 2018-04-22 ENCOUNTER — Inpatient Hospital Stay (HOSPITAL_COMMUNITY): Payer: Medicare Other

## 2018-04-22 ENCOUNTER — Other Ambulatory Visit: Payer: Self-pay

## 2018-04-22 ENCOUNTER — Encounter (HOSPITAL_COMMUNITY): Payer: Self-pay | Admitting: Internal Medicine

## 2018-04-22 ENCOUNTER — Inpatient Hospital Stay (HOSPITAL_COMMUNITY): Payer: Medicare Other | Attending: Hematology | Admitting: Internal Medicine

## 2018-04-22 VITALS — BP 123/71 | HR 75 | Temp 98.4°F | Resp 18 | Wt 147.0 lb

## 2018-04-22 DIAGNOSIS — D508 Other iron deficiency anemias: Secondary | ICD-10-CM

## 2018-04-22 DIAGNOSIS — Z902 Acquired absence of lung [part of]: Secondary | ICD-10-CM | POA: Diagnosis not present

## 2018-04-22 DIAGNOSIS — I10 Essential (primary) hypertension: Secondary | ICD-10-CM | POA: Insufficient documentation

## 2018-04-22 DIAGNOSIS — Z923 Personal history of irradiation: Secondary | ICD-10-CM | POA: Insufficient documentation

## 2018-04-22 DIAGNOSIS — D649 Anemia, unspecified: Secondary | ICD-10-CM | POA: Diagnosis not present

## 2018-04-22 DIAGNOSIS — C3490 Malignant neoplasm of unspecified part of unspecified bronchus or lung: Secondary | ICD-10-CM

## 2018-04-22 DIAGNOSIS — J069 Acute upper respiratory infection, unspecified: Secondary | ICD-10-CM

## 2018-04-22 DIAGNOSIS — C3432 Malignant neoplasm of lower lobe, left bronchus or lung: Secondary | ICD-10-CM

## 2018-04-22 DIAGNOSIS — D696 Thrombocytopenia, unspecified: Secondary | ICD-10-CM | POA: Insufficient documentation

## 2018-04-22 DIAGNOSIS — C3492 Malignant neoplasm of unspecified part of left bronchus or lung: Secondary | ICD-10-CM

## 2018-04-22 LAB — COMPREHENSIVE METABOLIC PANEL
ALK PHOS: 102 U/L (ref 38–126)
ALT: 46 U/L — AB (ref 0–44)
AST: 20 U/L (ref 15–41)
Albumin: 3.4 g/dL — ABNORMAL LOW (ref 3.5–5.0)
Anion gap: 11 (ref 5–15)
BUN: 23 mg/dL (ref 8–23)
CALCIUM: 9 mg/dL (ref 8.9–10.3)
CO2: 26 mmol/L (ref 22–32)
CREATININE: 1.04 mg/dL (ref 0.61–1.24)
Chloride: 98 mmol/L (ref 98–111)
GFR calc non Af Amer: >60 mL/min (ref >60)
GLUCOSE: 104 mg/dL — AB (ref 70–99)
Potassium: 3.7 mmol/L (ref 3.5–5.1)
SODIUM: 135 mmol/L (ref 135–145)
Total Bilirubin: 0.8 mg/dL (ref 0.3–1.2)
Total Protein: 7.6 g/dL (ref 6.5–8.1)

## 2018-04-22 LAB — CBC WITH DIFFERENTIAL/PLATELET
BASOS ABS: 0.1 10*3/uL (ref 0.0–0.1)
BASOS PCT: 1 %
Eosinophils Absolute: 0.1 10*3/uL (ref 0.0–0.7)
Eosinophils Relative: 1 %
HCT: 36.8 % — ABNORMAL LOW (ref 39.0–52.0)
Hemoglobin: 11.7 g/dL — ABNORMAL LOW (ref 13.0–17.0)
Lymphocytes Relative: 18 %
Lymphs Abs: 1.7 10*3/uL (ref 0.7–4.0)
MCH: 31 pg (ref 26.0–34.0)
MCHC: 31.8 g/dL (ref 30.0–36.0)
MCV: 97.4 fL (ref 78.0–100.0)
MONO ABS: 0.5 10*3/uL (ref 0.1–1.0)
MONOS PCT: 5 %
Neutro Abs: 7.2 10*3/uL (ref 1.7–7.7)
Neutrophils Relative %: 75 %
Platelets: 651 10*3/uL — ABNORMAL HIGH (ref 150–400)
RBC: 3.78 MIL/uL — ABNORMAL LOW (ref 4.22–5.81)
RDW: 16.9 % — ABNORMAL HIGH (ref 11.5–15.5)
WBC: 9.7 10*3/uL (ref 4.0–10.5)

## 2018-04-22 LAB — FERRITIN: Ferritin: 392 ng/mL — ABNORMAL HIGH (ref 24–336)

## 2018-04-22 LAB — IRON AND TIBC
IRON: 86 ug/dL (ref 45–182)
Saturation Ratios: 22 % (ref 17.9–39.5)
TIBC: 389 ug/dL (ref 250–450)
UIBC: 303 ug/dL

## 2018-04-22 NOTE — Progress Notes (Signed)
Diagnosis Adenocarcinoma of lung, unspecified laterality ([place]) - Plan: CT Chest Wo Contrast, CBC with Differential/Platelet, Comprehensive metabolic panel, Lactate dehydrogenase, Ferritin  Other iron deficiency anemia - Plan: CT Chest Wo Contrast, CBC with Differential/Platelet, Comprehensive metabolic panel, Lactate dehydrogenase, Ferritin  Adenocarcinoma of left lung (HCC) - Plan: CT Chest Wo Contrast, CBC with Differential/Platelet, Comprehensive metabolic panel, Lactate dehydrogenase, Ferritin  Staging Cancer Staging Adenocarcinoma of lung (HCC) Staging form: Lung, AJCC 7th Edition - Clinical: Stage IA (T1a, N0, M0) - Signed by Baird Cancer, PA on 05/06/2011   Assessment and Plan:  1.  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 Worthy Keeler.  He is s/p SBRT by Dr. Isidore Moos in 2015. Pt had CT scan of the chest from January 2019 which shows treatment related changes with no evidence of recurrence.    He has some respiratory issues and is currently on abx.  CT scan of chest done 04/19/2018 reviewed and showed  IMPRESSION: 1. Evolution of diffuse patchy and nodular airspace disease in the right lung. Overall mild trend towards improvement while the main consolidative area in the anterior right lung shows mild progression posteriorly. Lymph nodes in the subcarinal region and right hilum have decreased. Features may reflect resolving infection/inflammation. Close attention will be required as neoplasm not excluded. 2. Similar appearance of left lower lobe lung lesion with post radiation scarring. 3. Anterior left lung tree-in-bud nodularity appears improved and has nearly resolved. 4.  Aortic Atherosclerois (ICD10-170.0) 5.  Emphysema. (MCN47-S96.9)  I have discussed with him scan findings are felt to be consistent with inflammation/infection.  He should complete abx course recommended by pulmonary.  If symptoms fail to improve he should notify the  office and pt will be set up for interval CXR.   Pulse ox is 98% on room air.    He will be set up for CT of chest in 07/2018 and will follow-up with Dr. Worthy Keeler at that time to go over results. He expressed understanding of the information presented.  Follow-up with Dr. Isidore Moos as recommended.    2.  Right lung cancer: -Status post right upper lobectomy on 10/15/2010, 1.6 cm, negative margins, 0 out of 4 positive lymph nodes.  Recent CT of chest done 04/19/2018 shows findings suggesting of resolving inflammation infection.  He should continue abx as recommended.   He will be set up for CT of chest in 07/2018 for ongoing follow-up.    3.  URI/Chest congestion.  He is on abx.  He is recommended to complete abx course.  He should follow-up with pulmonary as directed.  He is afebrile in clinic today with temp of 98.4.    4.  Anemia/thrombocytosis.  Labs done 04/22/2018 reviewed and showed WBC 9.7 HB 11.7 plts 651,000, chemistries WNL with K+ 3.7, Cr 1, ALT 46.  Will repeat labs on RTC.  Plt count likely elevated due to reaction to current URI symptoms.    5.  HTN.  BP is 123/71.  Follow-up with PCP.    30 minutes spent with more than 50% spent in counseling and coordination of care.      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 Worthy Keeler.  He is s/p SBRT by Dr. Isidore Moos in 2015. Pt had CT scan of the chest from January 2019 which shows treatment related changes with no evidence of recurrence.  Pt also has a diagnosis of Right lung  cancer s/p  right upper lobectomy on 10/15/2010, 1.6 cm, negative margins, 0 out of 4 positive lymph nodes.  Current Status:  Pt reports he remains on abx.  He is here to go over scans.      Adenocarcinoma of lung ([place])   09/30/2010 Cancer Staging    CT of chest showed spiculated lesion    10/15/2010 Surgery    Right upper lobectomy by Dr. Arlyce Dice    10/16/2010 Remission       10/24/2013 Imaging    CT Chest-   Enlarging subpleural nodule in the left lower lobe, now 9 mm, with distortion of the overlying pleura. Finding is concerning for a small bronchogenic carcinoma    01/23/2014 Imaging    CT Chest- Left lower lobe nodule has enlarged from 11/01/2012 and is worrisome for primary bronchogenic carcinoma.     07/04/2014 - 07/10/2014 Radiation Therapy    SBRT by Dr. Isidore Moos in 3 fractions.    07/05/2015 Imaging    perifissural nodule in LLL appears slightly smaller with parenchymal opacity attrib to XRT, likely inflamm pathcy airspace dz more inferiorly in LLL, airspace dz in RLL resolved. no metastatic disease     Primary cancer of left lower lobe of lung ([place])   06/18/2014 Initial Diagnosis    Left lower lobe nodule suspicious for primary lung carcinoma    07/04/2014 - 07/10/2014 Radiation Therapy    SBRT    10/12/2014 Imaging    No acute findings within the chest. Decrease in size of left lower lobe perifissural nodule compatible with response to therapy. No new or progressive disease identified within the chest.      Problem List Patient Active Problem List   Diagnosis Date Noted  . Iron deficiency anemia [D50.9] 03/15/2018  . Lipoma of back [D17.1] 09/25/2017  . Dyslipidemia, goal LDL below 70 [E78.5] 03/23/2017  . Coronary artery calcification seen on CAT scan [I25.10] 03/23/2017  . GERD (gastroesophageal reflux disease) [K21.9] 06/21/2014  . Nausea without vomiting [R11.0] 06/21/2014  . Primary cancer of left lower lobe of lung ([place]) [C34.32] 06/18/2014  . Adenocarcinoma of lung ([place]) [C34.90] 05/06/2011  . Essential hypertension [I10] 05/06/2011  . COPD (chronic obstructive pulmonary disease) ([place]) [J44.9] 05/06/2011  . Emphysema [492] 05/06/2011  . Hypercholesterolemia [E78.00] 05/06/2011  . Back fracture [IMO0002]     Past Medical History Past Medical History:  Diagnosis Date  . Adenocarcinoma of lung ([place]) 05/06/2011   rt lung/dx 2011/surg only  . Allergy   . Anxiety    . Arthritis    finger  . Back fracture    DUE TO AA IN 1970  . Chronic back pain   . 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 Essential hypertension 05/06/2011  . GERD (gastroesophageal reflux disease)   . High cholesterol   . High coronary artery calcium score Julye 2018   Agaston Score 1043. dense RCA calcification --Non-ischemic Myoview  . History of radiation therapy 07/04/14, 07/06/14, 07/10/14   LLL lung nodule/54 Gy/3 fx- SBRT  . Hypercholesterolemia 05/06/2011  . Pneumonia due to Streptococcus    HX. POST OPERATIVELY  . Stress fx pelvis    DUE TO AA IN 1970    Past Surgical History Past Surgical History:  Procedure Laterality Date  . BACK SURGERY  07/04/2017   fusion above previous surgery  . BACK SURGERY  2011   metal plate l-4,L5  . Coroanry Calcium Score  03/2017   1043. Noted especially in RCA.  Dense calcification, recommend Myoview over Cor CTA.  Marland Kitchen LIPOMA EXCISION N/A 10/14/2017   Procedure: EXCISION LIPOMA ON BACK;  Surgeon: Virl Cagey, MD;  Location: [facility];  Service: General;  Laterality: N/A;  . LUNG LOBECTOMY  2012   RT UPPER LOBE  . NM MYOVIEW LTD  04/2017   EF 60%. Hypertensive response to exercise. (6:21 min, 7.7 METs) No EKG changes. LOW RISK    Family History Family History  Problem Relation Age of Onset  . Hypertension Mother   . Kidney disease Father   . Heart attack Brother   . Cancer Maternal Uncle        prostate cancer  . Cancer Maternal Uncle        prostate cancer  . Cancer Maternal Uncle        prostate cancer  . Cancer Maternal Uncle        prostate cancer     Social History  reports that he quit smoking about 7 years ago. His smoking use included cigarettes. His smokeless tobacco use includes chew. He reports that he does not drink alcohol or use drugs.  Medications  Current Outpatient Medications:  .  acetaminophen (TYLENOL) 500 MG tablet, Take 1,000 mg by mouth every 6 (six)  hours as needed for moderate pain or headache., Disp: , Rfl:  .  albuterol (PROVENTIL HFA;VENTOLIN HFA) 108 (90 Base) MCG/ACT inhaler, Inhale 2 puffs into the lungs every 6 (six) hours as needed for wheezing or shortness of breath., Disp: 2 Inhaler, Rfl: 3 .  ALPRAZolam (XANAX) 0.5 MG tablet, Take 0.5 mg by mouth at bedtime. , Disp: , Rfl:  .  benzonatate (TESSALON) 100 MG capsule, Take 1 capsule (100 mg total) by mouth 3 (three) times daily., Disp: 30 capsule, Rfl: 1 .  cimetidine (TAGAMET) 200 MG tablet, Take 400 mg by mouth daily as needed (for heartburn)., Disp: , Rfl:  .  clarithromycin (BIAXIN) 500 MG tablet, Take 1 tablet (500 mg total) by mouth 2 (two) times daily., Disp: 28 tablet, Rfl: 0 .  Cyanocobalamin (VITAMIN B-12 PO), Take 1 tablet by mouth daily., Disp: , Rfl:  .  fluticasone (FLONASE) 50 MCG/ACT nasal spray, Place 2 sprays into both nostrils daily as needed for allergies. , Disp: , Rfl:  .  hydrochlorothiazide 25 MG tablet, Take 12.5 mg by mouth daily. , Disp: , Rfl:  .  HYDROcodone-acetaminophen (NORCO) 10-325 MG per tablet, Take 1 tablet by mouth every 6 (six) hours as needed for moderate pain. , Disp: , Rfl:  .  indomethacin (INDOCIN) 50 MG capsule, , Disp: , Rfl:  .  Menthol-Methyl Salicylate (MUSCLE RUB EX), Apply 1 application topically daily as needed (for pain)., Disp: , Rfl:  .  pantoprazole (PROTONIX) 40 MG tablet, Take 40 mg by mouth daily., Disp: , Rfl:  .  simvastatin (ZOCOR) 40 MG tablet, Take 40 mg by mouth every evening. , Disp: , Rfl:   Allergies Fentanyl; Gabapentin; Aspirin; and Celecoxib  Review of Systems Review of Systems - Oncology ROS negative other than cough and congestion.     Physical Exam  Vitals Wt Readings from Last 3 Encounters:  04/22/18 147 lb (66.7 kg)  04/08/18 151 lb (68.5 kg)  03/26/18 152 lb 12.8 oz (69.3 kg)   Temp Readings from Last 3 Encounters:  04/22/18 98.4 F (36.9 C) (Oral)  03/26/18 98 F (36.7 C) (Oral)  03/25/18  98 F (36.7 C) (Oral)   BP Readings from Last 3 Encounters:  04/22/18 123/71  04/08/18 130/76  03/26/18 (!) 143/69   Pulse Readings from Last 3 Encounters:  04/22/18 75  04/08/18 83  03/26/18 89   Constitutional: Well-developed, well-nourished, and in no distress.   HENT: Head: Normocephalic and atraumatic.  Mouth/Throat: No oropharyngeal exudate. Mucosa moist. Eyes: Pupils are equal, round, and reactive to light. Conjunctivae are normal. No scleral icterus.  Neck: Normal range of motion. Neck supple. No JVD present.  Cardiovascular: Normal rate, regular rhythm and normal heart sounds.  Exam reveals no gallop and no friction rub.   No murmur heard. Pulmonary/Chest: Effort normal and breath sounds normal. No respiratory distress. Scar noted over right chest wall from prior surgery.  Coarse BS.   Abdominal: Soft. Bowel sounds are normal. No distension. There is no tenderness. There is no guarding.  Musculoskeletal: No edema or tenderness.  Lymphadenopathy: No cervical, axillary or supraclavicular adenopathy.  Neurological: Alert and oriented to person, place, and time. No cranial nerve deficit.  Skin: Skin is warm and dry. No rash noted. No erythema. No pallor.  Psychiatric: Affect and judgment normal.   Labs Appointment on 04/22/2018  Component Date Value Ref Range Status  . WBC 04/22/2018 9.7  4.0 - 10.5 K/uL Final  . RBC 04/22/2018 3.78* 4.22 - 5.81 MIL/uL Final  . Hemoglobin 04/22/2018 11.7* 13.0 - 17.0 g/dL Final  . HCT 04/22/2018 36.8* 39.0 - 52.0 % Final  . MCV 04/22/2018 97.4  78.0 - 100.0 fL Final  . MCH 04/22/2018 31.0  26.0 - 34.0 pg Final  . MCHC 04/22/2018 31.8  30.0 - 36.0 g/dL Final  . RDW 04/22/2018 16.9* 11.5 - 15.5 % Final  . Platelets 04/22/2018 651* 150 - 400 K/uL Final  . Neutrophils Relative % 04/22/2018 75  % Final  . Neutro Abs 04/22/2018 7.2  1.7 - 7.7 K/uL Final  . Lymphocytes Relative 04/22/2018 18  % Final  . Lymphs Abs 04/22/2018 1.7  0.7 - 4.0  K/uL Final  . Monocytes Relative 04/22/2018 5  % Final  . Monocytes Absolute 04/22/2018 0.5  0.1 - 1.0 K/uL Final  . Eosinophils Relative 04/22/2018 1  % Final  . Eosinophils Absolute 04/22/2018 0.1  0.0 - 0.7 K/uL Final  . Basophils Relative 04/22/2018 1  % Final  . Basophils Absolute 04/22/2018 0.1  0.0 - 0.1 K/uL Final   Performed at [facility], [address], [place], [place] [address]  . Sodium 04/22/2018 135  135 - 145 mmol/L Final  . Potassium 04/22/2018 3.7  3.5 - 5.1 mmol/L Final  . Chloride 04/22/2018 98  98 - 111 mmol/L Final  . CO2 04/22/2018 26  22 - 32 mmol/L Final  . Glucose, Bld 04/22/2018 104* 70 - 99 mg/dL Final  . BUN 04/22/2018 23  8 - 23 mg/dL Final  . Creatinine, Ser 04/22/2018 1.04  0.61 - 1.24 mg/dL Final  . Calcium 04/22/2018 9.0  8.9 - 10.3 mg/dL Final  . Total Protein 04/22/2018 7.6  6.5 - 8.1 g/dL Final  . Albumin 04/22/2018 3.4* 3.5 - 5.0 g/dL Final  . AST 04/22/2018 20  15 - 41 U/L Final  . ALT 04/22/2018 46* 0 - 44 U/L Final  . Alkaline Phosphatase 04/22/2018 102  38 - 126 U/L Final  . Total Bilirubin 04/22/2018 0.8  0.3 - 1.2 mg/dL Final  . GFR calc non Af Amer 04/22/2018 >60  >60 mL/min Final  . GFR calc Af Amer 04/22/2018 >60  >60 mL/min Final   Comment: (NOTE) The  eGFR has been calculated using the CKD EPI equation. This calculation has not been validated in all clinical situations. eGFR's persistently <60 mL/min signify possible Chronic Kidney Disease.   Georgiann Hahn gap 04/22/2018 11  5 - 15 Final   Performed at [facility], [address] [place], [place] [address]     Pathology Orders Placed This Encounter  Procedures  . CT Chest Wo Contrast    Standing Status:   Future    Standing Expiration Date:   04/22/2019    Order Specific Question:   Preferred imaging location?    Answer:   [facility]    Order Specific Question:   Radiology Contrast Protocol - do NOT remove file path    Answer:    [email or web address]  . CBC with Differential/Platelet    Standing Status:   Future    Standing Expiration Date:   04/23/2019  . Comprehensive metabolic panel    Standing Status:   Future    Standing Expiration Date:   04/23/2019  . Lactate dehydrogenase    Standing Status:   Future    Standing Expiration Date:   04/23/2019  . Ferritin    Standing Status:   Future    Standing Expiration Date:   04/23/2019       Zoila Shutter MD

## 2018-04-23 ENCOUNTER — Encounter: Payer: Self-pay | Admitting: Primary Care

## 2018-04-23 ENCOUNTER — Ambulatory Visit (INDEPENDENT_AMBULATORY_CARE_PROVIDER_SITE_OTHER): Payer: Medicare Other | Admitting: Primary Care

## 2018-04-23 DIAGNOSIS — J189 Pneumonia, unspecified organism: Secondary | ICD-10-CM

## 2018-04-23 DIAGNOSIS — C3432 Malignant neoplasm of lower lobe, left bronchus or lung: Secondary | ICD-10-CM | POA: Diagnosis not present

## 2018-04-23 DIAGNOSIS — J181 Lobar pneumonia, unspecified organism: Secondary | ICD-10-CM

## 2018-04-23 DIAGNOSIS — J440 Chronic obstructive pulmonary disease with acute lower respiratory infection: Secondary | ICD-10-CM | POA: Diagnosis not present

## 2018-04-23 MED ORDER — ALBUTEROL SULFATE HFA 108 (90 BASE) MCG/ACT IN AERS: 2.0000 | INHALATION_SPRAY | RESPIRATORY_TRACT | 3 refills | Status: AC | PRN

## 2018-04-23 NOTE — Progress Notes (Signed)
@Patient  ID: Maurice Orozco, male    DOB: [birth year], 72 y.o.   MRN: [id number]  Chief Complaint  Patient presents with  . Follow-up    breath a lot better-coughing a little with little clear sputum-sleeping better    Referring provider: Redmond School, MD  HPI: 72 year old male, former smoker (quit 2012).   Hx adenocarcinoma of left lower lung stage Ia. S/p SBRT by Dr. Isidore Moos in 2015. CT in jan 2019 showed treatment related changes with no evidence of recurrence. Right lung CA, S/P right upper lobectomy in 2012, 1.6cm negative margins.   Saw Dr. Ander Slade for an acute office visit on 04/08/09 with complaints of cough and sob, started on Levaquin. No improvement with abx, changed to Biaxin x 14 days (anticipate completion 8/21). Patient saw oncology yesterday to review scans. Most recent CT chest Aug 2019 shows findings suggestive of resolving inflammation/infection. Plan continue abx, if symptoms fail to improve would check interval CXR. Follow up CT scheduled for November 2019.    04/23/2018 Presents today for follow-up visit with his wife. States that he feels a whole lot better. Still has occasional cough but not like it was. He is able to sleep. His breathing is at baseline. Not acutely sob.  About to run out of inhaler, needs refill. He was using prn albuterol more frequently over the past 2 weeks. Denies fever, weight loss or hemoptysis.    Allergies  Allergen Reactions  . Fentanyl Other (See Comments)    CAUSED HALLUCINATIONS/05-03-13 pt reports it was oral fentanyl that caused hallucinations  . Gabapentin Other (See Comments)    Bones throbbed, headaches, weakness  . Aspirin Nausea Only  . Celecoxib Rash     There is no immunization history on file for this patient.  Past Medical History:  Diagnosis Date  . Adenocarcinoma of lung ([place]) 05/06/2011   rt lung/dx 2011/surg only  . Allergy   . Anxiety   . Arthritis    finger  . Back fracture    DUE TO AA IN 1970  .  Chronic back pain   . COPD (chronic obstructive pulmonary disease) with emphysema ([place]) 05/06/2011  . Essential hypertension 05/06/2011  . GERD (gastroesophageal reflux disease)   . High cholesterol   . High coronary artery calcium score Julye 2018   Agaston Score 1043. dense RCA calcification --Non-ischemic Myoview  . History of radiation therapy 07/04/14, 07/06/14, 07/10/14   LLL lung nodule/54 Gy/3 fx- SBRT  . Hypercholesterolemia 05/06/2011  . Pneumonia due to Streptococcus    HX. POST OPERATIVELY  . Stress fx pelvis    DUE TO AA IN 1970    Tobacco History: Social History   Tobacco Use  Smoking Status Former Smoker  . Types: Cigarettes  . Last attempt to quit: 09/08/2010  . Years since quitting: 7.6  Smokeless Tobacco Current User  . Types: Chew  Tobacco Comment   still chews tobacco   Ready to quit: Not Answered Counseling given: Not Answered Comment: still chews tobacco   Outpatient Medications Prior to Visit  Medication Sig Dispense Refill  . acetaminophen (TYLENOL) 500 MG tablet Take 1,000 mg by mouth every 6 (six) hours as needed for moderate pain or headache.    . ALPRAZolam (XANAX) 0.5 MG tablet Take 0.5 mg by mouth at bedtime.     . benzonatate (TESSALON) 100 MG capsule Take 1 capsule (100 mg total) by mouth 3 (three) times daily. 30 capsule 1  . cimetidine (TAGAMET) 200 MG  tablet Take 400 mg by mouth daily as needed (for heartburn).    . clarithromycin (BIAXIN) 500 MG tablet Take 1 tablet (500 mg total) by mouth 2 (two) times daily. 28 tablet 0  . Cyanocobalamin (VITAMIN B-12 PO) Take 1 tablet by mouth daily.    . fluticasone (FLONASE) 50 MCG/ACT nasal spray Place 2 sprays into both nostrils daily as needed for allergies.     . hydrochlorothiazide 25 MG tablet Take 12.5 mg by mouth daily.     Marland Kitchen HYDROcodone-acetaminophen (NORCO) 10-325 MG per tablet Take 1 tablet by mouth every 6 (six) hours as needed for moderate pain.     . indomethacin (INDOCIN) 50 MG capsule      . Menthol-Methyl Salicylate (MUSCLE RUB EX) Apply 1 application topically daily as needed (for pain).    . pantoprazole (PROTONIX) 40 MG tablet Take 40 mg by mouth daily.    . simvastatin (ZOCOR) 40 MG tablet Take 40 mg by mouth every evening.     Marland Kitchen albuterol (PROVENTIL HFA;VENTOLIN HFA) 108 (90 Base) MCG/ACT inhaler Inhale 2 puffs into the lungs every 6 (six) hours as needed for wheezing or shortness of breath. 2 Inhaler 3   No facility-administered medications prior to visit.     Review of Systems  Review of Systems  Constitutional: Negative.   HENT: Negative.   Respiratory: Negative.   Cardiovascular: Negative.   Gastrointestinal: Negative.   Genitourinary: Negative.   Skin: Negative.   Neurological: Negative.   Psychiatric/Behavioral: Negative.     Physical Exam  BP 118/64 (BP Location: Left Arm, Cuff Size: Normal)   Pulse 76   Ht 5\' 6"  (1.676 m)   Wt 146 lb 9.6 oz (66.5 kg)   SpO2 96%   BMI 23.66 kg/m  Physical Exam  Constitutional: He is oriented to person, place, and time. He appears well-developed and well-nourished.  HENT:  Head: Normocephalic and atraumatic.  Eyes: Pupils are equal, round, and reactive to light. EOM are normal.  Neck: Normal range of motion. Neck supple.  Cardiovascular: Normal rate, regular rhythm, normal heart sounds and intact distal pulses.  Pulmonary/Chest: Effort normal and breath sounds normal. No respiratory distress. He has no wheezes.  [place] t/o. No resp distress.  Abdominal: Soft. Bowel sounds are normal. There is no tenderness.  Neurological: He is alert and oriented to person, place, and time.  Skin: Skin is warm and dry. No rash noted. No erythema.  Psychiatric: He has a normal mood and affect. His behavior is normal. Judgment normal.     Lab Results:  CBC    Component Value Date/Time   WBC 9.7 04/22/2018 1203   RBC 3.78 (L) 04/22/2018 1203   HGB 11.7 (L) 04/22/2018 1203   HCT 36.8 (L) 04/22/2018 1203   PLT 651 (H)  04/22/2018 1203   MCV 97.4 04/22/2018 1203   MCH 31.0 04/22/2018 1203   MCHC 31.8 04/22/2018 1203   RDW 16.9 (H) 04/22/2018 1203   LYMPHSABS 1.7 04/22/2018 1203   MONOABS 0.5 04/22/2018 1203   EOSABS 0.1 04/22/2018 1203   BASOSABS 0.1 04/22/2018 1203    BMET    Component Value Date/Time   NA 135 04/22/2018 1203   K 3.7 04/22/2018 1203   CL 98 04/22/2018 1203   CO2 26 04/22/2018 1203   GLUCOSE 104 (H) 04/22/2018 1203   BUN 23 04/22/2018 1203   BUN 23.9 07/05/2015 1239   CREATININE 1.04 04/22/2018 1203   CREATININE 0.9 07/05/2015 1239   CALCIUM 9.0  04/22/2018 1203   GFRNONAA >60 04/22/2018 1203   GFRAA >60 04/22/2018 1203    BNP No results found for: BNP  ProBNP No results found for: PROBNP  Imaging: Ct Chest Wo Contrast  Result Date: 04/20/2018 CLINICAL DATA:  Recent pneumonia.  History of right upper lobectomy. EXAM: CT CHEST WITHOUT CONTRAST TECHNIQUE: Multidetector CT imaging of the chest was performed following the standard protocol without IV contrast. COMPARISON:  03/23/2018 FINDINGS: Cardiovascular: The heart size is normal. No substantial pericardial effusion. Coronary artery calcification is evident. Atherosclerotic calcification is noted in the wall of the thoracic aorta. Mediastinum/Nodes: 8 mm right thoracic inlet lymph node measured previously is stable. 13 mm short axis precarinal lymph node measured previously is 12 mm on today's exam. 14 mm subcarinal lymph node measured previously is now 8 mm in the right hilar lymph node measured previously at 15 mm is now 14 mm. Small hiatal hernia. The esophagus has normal imaging features. There is no axillary lymphadenopathy. Lungs/Pleura: The central tracheobronchial airways are patent. Stable volume loss right hemithorax. Centrilobular and paraseptal emphysema. Interstitial and patchy airspace disease seen in the right lung has improved but has not resolved. Multiple nodular components to the disease persist, including 14  mm nodule on image 102 of series 4. Evolution of consolidative changes in the anterior right lung appears mildly progressive. Similar appearance of left lower lobe nodule with adjacent scarring. Upper Abdomen: Unremarkable. Musculoskeletal: No worrisome lytic or sclerotic osseous abnormality. IMPRESSION: 1. Evolution of diffuse patchy and nodular airspace disease in the right lung. Overall mild trend towards improvement while the main consolidative area in the anterior right lung shows mild progression posteriorly. Lymph nodes in the subcarinal region and right hilum have decreased. Features may reflect resolving infection/inflammation. Close attention will be required as neoplasm not excluded. 2. Similar appearance of left lower lobe lung lesion with post radiation scarring. 3. Anterior left lung tree-in-bud nodularity appears improved and has nearly resolved. 4.  Aortic Atherosclerois (ICD10-170.0) 5.  Emphysema. (ASN05-L97.9) Electronically Signed   By: Misty Stanley M.D.   On: 04/20/2018 08:14     Assessment & Plan:   PNA (pneumonia) - Dx Multilobular PNA, hx lung cancer  - Symptomatically and clinically appears better - Improvement seen on repeat CT scan - Sputum culture with normal growth  - Saw oncology 8/15, CT changes felt to be related to infection/inflammation - Continue Biaxin x14 days, completion date 8/21 - Chest CT schedule for November with oncology (ok to cancel September CT) - Patient to notify office if he fails to improve or symptoms return/worsen - FU in 3 months with Dr. Ander Slade    Primary cancer of left lower lobe of lung ([place]) - Hx adenocarcinoma of left lower lung stage Ia. S/p SBRT by Dr. Isidore Moos in 2015. CT in jan 2019 showed treatment related changes with no evidence of recurrence.  - Follows with Dr. Worthy Keeler, next apt in November      Martyn Ehrich, NP 04/23/2018

## 2018-04-23 NOTE — Patient Instructions (Addendum)
Follow with Oncology CT scheduled for November   If symptoms not improving, fail to resolve or develop worsening cough/sob would recheck CXR   Return to our office as needed  FU in 3 months with Dr. Ander Slade

## 2018-04-23 NOTE — Assessment & Plan Note (Signed)
-   Hx adenocarcinoma of left lower lung stage Ia. S/p SBRT by Dr. Isidore Moos in 2015. CT in jan 2019 showed treatment related changes with no evidence of recurrence.  - Follows with Dr. Worthy Keeler, next apt in November

## 2018-04-23 NOTE — Assessment & Plan Note (Addendum)
-   Dx Multilobular PNA, hx lung cancer  - Symptomatically and clinically appears better - Improvement seen on repeat CT scan - Sputum culture with normal growth  - Saw oncology 8/15, CT changes felt to be related to infection/inflammation - Continue Biaxin x14 days, completion date 8/21 - Chest CT schedule for November with oncology (ok to cancel September CT) - Patient to notify office if he fails to improve or symptoms return/worsen - FU in 3 months with Dr. Ander Slade

## 2018-05-18 ENCOUNTER — Ambulatory Visit (HOSPITAL_COMMUNITY): Payer: Medicare Other

## 2018-05-20 ENCOUNTER — Ambulatory Visit: Payer: Medicare Other | Admitting: Pulmonary Disease

## 2018-06-10 DIAGNOSIS — G894 Chronic pain syndrome: Secondary | ICD-10-CM | POA: Diagnosis not present

## 2018-06-10 DIAGNOSIS — Z1389 Encounter for screening for other disorder: Secondary | ICD-10-CM | POA: Diagnosis not present

## 2018-06-10 DIAGNOSIS — M47816 Spondylosis without myelopathy or radiculopathy, lumbar region: Secondary | ICD-10-CM | POA: Diagnosis not present

## 2018-06-10 DIAGNOSIS — K219 Gastro-esophageal reflux disease without esophagitis: Secondary | ICD-10-CM | POA: Diagnosis not present

## 2018-06-10 DIAGNOSIS — I10 Essential (primary) hypertension: Secondary | ICD-10-CM | POA: Diagnosis not present

## 2018-06-10 DIAGNOSIS — Z6824 Body mass index (BMI) 24.0-24.9, adult: Secondary | ICD-10-CM | POA: Diagnosis not present

## 2018-06-10 DIAGNOSIS — M62830 Muscle spasm of back: Secondary | ICD-10-CM | POA: Diagnosis not present

## 2018-06-10 DIAGNOSIS — F419 Anxiety disorder, unspecified: Secondary | ICD-10-CM | POA: Diagnosis not present

## 2018-06-29 ENCOUNTER — Ambulatory Visit (HOSPITAL_COMMUNITY): Payer: Medicare Other | Admitting: Hematology

## 2018-07-08 NOTE — Progress Notes (Signed)
Maurice Orozco presents for follow up of radiation to his left lower lung completed 07/10/2014. He had a CT Chest 07/12/18. He will see Dr. Delton Coombes on 07/23/18. He reports a cough related to sinus drainage. He reports some shortness of breath with activity which has not changed. He reports a good appetite.   BP 110/66 (BP Location: Right Arm, Patient Position: Sitting)   Pulse 84   Temp 98.4 F (36.9 C) (Oral)   Resp 20   Ht 5\' 6"  (1.676 m)   Wt 152 lb (68.9 kg)   SpO2 96%   BMI 24.53 kg/m    Wt Readings from Last 3 Encounters:  07/13/18 152 lb (68.9 kg)  04/23/18 146 lb 9.6 oz (66.5 kg)  04/22/18 147 lb (66.7 kg)

## 2018-07-12 ENCOUNTER — Inpatient Hospital Stay (HOSPITAL_COMMUNITY): Payer: Medicare Other | Attending: Hematology

## 2018-07-12 ENCOUNTER — Ambulatory Visit (HOSPITAL_COMMUNITY)
Admission: RE | Admit: 2018-07-12 | Discharge: 2018-07-12 | Disposition: A | Discharge status: Home / Self Care | Payer: Medicare Other | Source: Ambulatory Visit | Attending: Internal Medicine | Admitting: Internal Medicine

## 2018-07-12 DIAGNOSIS — C3432 Malignant neoplasm of lower lobe, left bronchus or lung: Secondary | ICD-10-CM | POA: Insufficient documentation

## 2018-07-12 DIAGNOSIS — D509 Iron deficiency anemia, unspecified: Secondary | ICD-10-CM | POA: Insufficient documentation

## 2018-07-12 DIAGNOSIS — D508 Other iron deficiency anemias: Secondary | ICD-10-CM

## 2018-07-12 DIAGNOSIS — J439 Emphysema, unspecified: Secondary | ICD-10-CM | POA: Insufficient documentation

## 2018-07-12 DIAGNOSIS — Z902 Acquired absence of lung [part of]: Secondary | ICD-10-CM | POA: Insufficient documentation

## 2018-07-12 DIAGNOSIS — Z79899 Other long term (current) drug therapy: Secondary | ICD-10-CM | POA: Diagnosis not present

## 2018-07-12 DIAGNOSIS — I700 Atherosclerosis of aorta: Secondary | ICD-10-CM | POA: Insufficient documentation

## 2018-07-12 DIAGNOSIS — C3492 Malignant neoplasm of unspecified part of left bronchus or lung: Secondary | ICD-10-CM | POA: Diagnosis not present

## 2018-07-12 DIAGNOSIS — C3490 Malignant neoplasm of unspecified part of unspecified bronchus or lung: Secondary | ICD-10-CM

## 2018-07-12 LAB — CBC WITH DIFFERENTIAL/PLATELET
ABS IMMATURE GRANULOCYTES: 0.05 10*3/uL (ref 0.00–0.07)
BASOS ABS: 0 10*3/uL (ref 0.0–0.1)
Basophils Relative: 0 %
Eosinophils Absolute: 0 10*3/uL (ref 0.0–0.5)
Eosinophils Relative: 0 %
HCT: 40.3 % (ref 39.0–52.0)
Hemoglobin: 12.9 g/dL — ABNORMAL LOW (ref 13.0–17.0)
Immature Granulocytes: 1 %
LYMPHS PCT: 14 %
Lymphs Abs: 1.2 10*3/uL (ref 0.7–4.0)
MCH: 31.7 pg (ref 26.0–34.0)
MCHC: 32 g/dL (ref 30.0–36.0)
MCV: 99 fL (ref 80.0–100.0)
MONO ABS: 0.4 10*3/uL (ref 0.1–1.0)
Monocytes Relative: 5 %
NEUTROS ABS: 6.8 10*3/uL (ref 1.7–7.7)
NRBC: 0 % (ref 0.0–0.2)
Neutrophils Relative %: 80 %
Platelets: 238 10*3/uL (ref 150–400)
RBC: 4.07 MIL/uL — AB (ref 4.22–5.81)
RDW: 13.3 % (ref 11.5–15.5)
WBC: 8.5 10*3/uL (ref 4.0–10.5)

## 2018-07-12 LAB — COMPREHENSIVE METABOLIC PANEL
ALBUMIN: 4.1 g/dL (ref 3.5–5.0)
ALT: 13 U/L (ref 0–44)
AST: 16 U/L (ref 15–41)
Alkaline Phosphatase: 68 U/L (ref 38–126)
Anion gap: 9 (ref 5–15)
BUN: 23 mg/dL (ref 8–23)
CALCIUM: 9 mg/dL (ref 8.9–10.3)
CO2: 25 mmol/L (ref 22–32)
CREATININE: 1.18 mg/dL (ref 0.61–1.24)
Chloride: 103 mmol/L (ref 98–111)
GFR calc Af Amer: >60 mL/min (ref >60)
GFR calc non Af Amer: >60 mL/min (ref >60)
GLUCOSE: 138 mg/dL — AB (ref 70–99)
Potassium: 3.7 mmol/L (ref 3.5–5.1)
SODIUM: 137 mmol/L (ref 135–145)
Total Bilirubin: 0.6 mg/dL (ref 0.3–1.2)
Total Protein: 7.1 g/dL (ref 6.5–8.1)

## 2018-07-12 LAB — LACTATE DEHYDROGENASE: LDH: 97 U/L — ABNORMAL LOW (ref 98–192)

## 2018-07-12 LAB — FERRITIN: Ferritin: 118 ng/mL (ref 24–336)

## 2018-07-13 ENCOUNTER — Encounter: Payer: Self-pay | Admitting: Radiation Oncology

## 2018-07-13 ENCOUNTER — Ambulatory Visit
Admission: RE | Admit: 2018-07-13 | Discharge: 2018-07-13 | Disposition: A | Discharge status: Home / Self Care | Payer: Medicare Other | Source: Ambulatory Visit | Attending: Radiation Oncology | Admitting: Radiation Oncology

## 2018-07-13 ENCOUNTER — Other Ambulatory Visit: Payer: Self-pay

## 2018-07-13 VITALS — BP 110/66 | HR 84 | Temp 98.4°F | Resp 20 | Ht 66.0 in | Wt 152.0 lb

## 2018-07-13 DIAGNOSIS — C3432 Malignant neoplasm of lower lobe, left bronchus or lung: Secondary | ICD-10-CM | POA: Diagnosis not present

## 2018-07-13 DIAGNOSIS — F17290 Nicotine dependence, other tobacco product, uncomplicated: Secondary | ICD-10-CM | POA: Diagnosis not present

## 2018-07-13 DIAGNOSIS — Z87891 Personal history of nicotine dependence: Secondary | ICD-10-CM | POA: Insufficient documentation

## 2018-07-13 DIAGNOSIS — Z885 Allergy status to narcotic agent status: Secondary | ICD-10-CM | POA: Insufficient documentation

## 2018-07-13 DIAGNOSIS — C3490 Malignant neoplasm of unspecified part of unspecified bronchus or lung: Secondary | ICD-10-CM

## 2018-07-13 DIAGNOSIS — Z08 Encounter for follow-up examination after completed treatment for malignant neoplasm: Secondary | ICD-10-CM | POA: Diagnosis not present

## 2018-07-13 DIAGNOSIS — Z886 Allergy status to analgesic agent status: Secondary | ICD-10-CM | POA: Diagnosis not present

## 2018-07-13 DIAGNOSIS — Z79899 Other long term (current) drug therapy: Secondary | ICD-10-CM | POA: Diagnosis not present

## 2018-07-13 DIAGNOSIS — Z888 Allergy status to other drugs, medicaments and biological substances status: Secondary | ICD-10-CM | POA: Diagnosis not present

## 2018-07-13 DIAGNOSIS — C3431 Malignant neoplasm of lower lobe, right bronchus or lung: Secondary | ICD-10-CM | POA: Diagnosis not present

## 2018-07-13 NOTE — Progress Notes (Signed)
Radiation Oncology         ([phone]) [phone] ________________________________  Name: Maurice Orozco MRN: [id number]  Date: 07/13/2018  DOB: September 08, 1946  Follow-Up Visit Note  Outpatient  CC: Redmond School, MD  Farrel Gobble, MD  Diagnosis and Prior Radiotherapy:    ICD-10-CM   1. Primary cancer of left lower lobe of lung (HCC) C34.32   2. Adenocarcinoma of lung, unspecified laterality ([place]) C34.90     Left lower lobe nodule suspicious for primary lung carcinoma, T1aN0M0 Stage IA 07/04/2014, 07/06/2014, 07/10/2014: 54 Gy directed to the left lower lung in 3 fractions  CHIEF COMPLAINT: Here for follow-up and surveillance of lung cancer  Narrative: Maurice Orozco presents with his wife for follow up of radiation completed 07/10/2014 to his left lower lung. He underwent RUL lobectomy on 10/15/10 by Dr. Arlyce Dice. 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 No adjuvant therapy. I treated a LLL nodule with SBRT in 2015. He has done well since. He is a former smoker, currently chews tobacco.  Since our last visit, the patient saw Dr. Ander Slade in pulmonology on 04/08/18 for further evaluation of July's chest CT and complaints of cough/shortness of breath. He was started on antibiotics for pneumonia. Repeat chest CT on 04/19/18 showed evolution of diffuse patchy and nodular airspace disease in the right lung. Overall mild trend towards improvement while the main consolidative area in the anterior right lung showed mild progression posteriorly. Lymph nodes in the subcarinal region and right hilum decreased.   Follow-up chest CT from yesterday shows clustered nodules in predominant bronchovascular distribution are improved from the prior chest CT as is resolution of consolidation within the right middle lobe. There are persistent clustered nodules in the right lower lobe. There is also a stable left lower lobe lesion with surrounding posttreatment scarring.  I reviewed his images personally and they look significantly better.  On review of systems, the patient reports a cough related to sinus drainage. He reports some shortness of breath with activity which has not changed. He reports a good appetite.  ALLERGIES:  is allergic to fentanyl; gabapentin; and aspirin.  Meds: Current Outpatient Medications  Medication Sig Dispense Refill  . acetaminophen (TYLENOL) 500 MG tablet Take 1,000 mg by mouth every 6 (six) hours as needed for moderate pain or headache.    . albuterol (PROVENTIL HFA;VENTOLIN HFA) 108 (90 Base) MCG/ACT inhaler Inhale 2 puffs into the lungs every 4 (four) hours as needed for wheezing or shortness of breath. 2 Inhaler 3  . benzonatate (TESSALON) 100 MG capsule Take 1 capsule (100 mg total) by mouth 3 (three) times daily. 30 capsule 1  . celecoxib (CELEBREX) 200 MG capsule     . cimetidine (TAGAMET) 200 MG tablet Take 400 mg by mouth daily as needed (for heartburn).    . fluticasone (FLONASE) 50 MCG/ACT nasal spray Place 2 sprays into both nostrils daily as needed for allergies.     . hydrochlorothiazide 25 MG tablet Take 12.5 mg by mouth daily.     Marland Kitchen HYDROcodone-acetaminophen (NORCO) 10-325 MG per tablet Take 1 tablet by mouth every 6 (six) hours as needed for moderate pain.     . Menthol-Methyl Salicylate (MUSCLE RUB EX) Apply 1 application topically daily as needed (for pain).    . methocarbamol (ROBAXIN) 500 MG tablet     . pantoprazole (PROTONIX) 40 MG tablet Take 40 mg by mouth daily.    Marland Kitchen senna-docusate (SENOKOT-S) 8.6-50 MG tablet  Take by mouth.    . simvastatin (ZOCOR) 40 MG tablet Take 40 mg by mouth every evening.     . traZODone (DESYREL) 100 MG tablet     . Cyanocobalamin (VITAMIN B-12 PO) Take 1 tablet by mouth daily.     No current facility-administered medications for this encounter.     Physical Findings:  height is 5\' 6"  (1.676 m) and weight is 152 lb (68.9 kg). His oral temperature is 98.4 F (36.9 C). His  blood pressure is 110/66 and his pulse is 84. His respiration is 20 and oxygen saturation is 96%.   General: Alert and oriented, in no acute distress. HEENT: Head is normocephalic. Dentures removed for oral exam. Oropharynx is clear. Neck: He has a mobile mass that is subcutaneous in the posterior left neck and feels benign. Otherwise no masses appreciated. Heart: Regular in rate and rhythm with no murmurs, rubs, or gallops. Chest: Clear to auscultation bilaterally, with no rhonchi, wheezes, or rales. Abdomen: Soft, nontender, nondistended, with no rigidity or guarding. Active bowel sounds. Neurologic: Speech is fluent. Coordination is intact. Psychiatric: Judgment and insight are intact. Affect is appropriate.  Lab Findings: Lab Results  Component Value Date   WBC 8.5 07/12/2018   HGB 12.9 (L) 07/12/2018   HCT 40.3 07/12/2018   MCV 99.0 07/12/2018   PLT 238 07/12/2018    Radiographic Findings: Ct Chest Wo Contrast  Result Date: 07/12/2018 CLINICAL DATA:  72 y/o M; Non-small cell lung cancer, NED, stage I/II, right upper lobectomy in 2012, left lower lobe cancer with laser surgery, follow up EXAM: CT CHEST WITHOUT CONTRAST TECHNIQUE: Multidetector CT imaging of the chest was performed following the standard protocol without IV contrast. COMPARISON:  04/19/2018, 03/23/2018, 04/03/2017 CT chest. FINDINGS: Cardiovascular: No significant vascular findings. Normal heart size. No pericardial effusion. Moderate coronary artery and aortic calcific atherosclerosis. Normal caliber thoracic aorta and main pulmonary artery. Mediastinum/Nodes: No enlarged mediastinal or axillary lymph nodes. Thyroid gland, trachea, and esophagus demonstrate no significant findings. Lungs/Pleura: Clustered nodules scattered throughout the lungs in bronchovascular distribution are improved from the prior chest CT as is resolution of consolidation within the right middle lobe. There are a few residual groups of clustered  nodules in the right lower lobe. Sutures in scarring related to right upper lobectomy. Stable left lower lobe lesion with surrounding posttreatment scarring and nodularity measuring up to 8 mm (series 4, image 98). Emphysema. Upper Abdomen: 13 mm cyst within the left interpolar kidney. Moderate hiatal hernia. Musculoskeletal: No chest wall mass or suspicious bone lesions identified. IMPRESSION: 1. Clustered nodules in predominant bronchovascular distribution are improved from the prior chest CT as is resolution of consolidation within the right middle lobe. There are persistent clustered nodules in the right lower lobe, careful attention at follow-up recommended to ensure resolution. 2. Stable left lower lobe lesion with surrounding posttreatment scarring. 3. Aortic Atherosclerosis (ICD10-I70.0) and Emphysema (ICD10-J43.9). Electronically Signed   By: Kristine Garbe M.D.   On: 07/12/2018 16:14    Impression/Plan:   Maurice Orozco underwent RUL lobectomy on 10/15/10 by Dr Arlyce Dice.  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 No adjuvant therapy. He is a former smoker, currently chews tobacco. I treated a LLL nodule with SBRT in 2015. Done well since.  Recent CT scan shows much improvement. He will see Dr. Delton Coombes on 07/23/18. Order Chest CT in 3 months at [facility]. Follow up with me soon after that. If that  CT continues to improve, we can stretch his future imaging out next year.   _____________________________________   Eppie Gibson, MD  This document serves as a record of services personally performed by Eppie Gibson, MD. It was created on her behalf by Rae Lips, a trained medical scribe. The creation of this record is based on the scribe's personal observations and the provider's statements to them. This document has been checked and approved by the attending provider.

## 2018-07-14 ENCOUNTER — Other Ambulatory Visit: Payer: Self-pay | Admitting: Radiation Oncology

## 2018-07-14 ENCOUNTER — Encounter: Payer: Self-pay | Admitting: Radiation Oncology

## 2018-07-14 DIAGNOSIS — C3432 Malignant neoplasm of lower lobe, left bronchus or lung: Secondary | ICD-10-CM

## 2018-07-19 ENCOUNTER — Ambulatory Visit (HOSPITAL_COMMUNITY): Payer: Medicare Other | Admitting: Hematology

## 2018-07-23 ENCOUNTER — Encounter (HOSPITAL_COMMUNITY): Payer: Self-pay | Admitting: Hematology

## 2018-07-23 ENCOUNTER — Inpatient Hospital Stay (HOSPITAL_BASED_OUTPATIENT_CLINIC_OR_DEPARTMENT_OTHER): Payer: Medicare Other | Admitting: Hematology

## 2018-07-23 VITALS — BP 147/68 | HR 79 | Temp 98.2°F | Resp 16 | Wt 152.6 lb

## 2018-07-23 DIAGNOSIS — C3432 Malignant neoplasm of lower lobe, left bronchus or lung: Secondary | ICD-10-CM

## 2018-07-23 DIAGNOSIS — D508 Other iron deficiency anemias: Secondary | ICD-10-CM

## 2018-07-23 DIAGNOSIS — Z902 Acquired absence of lung [part of]: Secondary | ICD-10-CM | POA: Diagnosis not present

## 2018-07-23 DIAGNOSIS — Z79899 Other long term (current) drug therapy: Secondary | ICD-10-CM

## 2018-07-23 DIAGNOSIS — C3490 Malignant neoplasm of unspecified part of unspecified bronchus or lung: Secondary | ICD-10-CM

## 2018-07-23 DIAGNOSIS — D509 Iron deficiency anemia, unspecified: Secondary | ICD-10-CM

## 2018-07-23 NOTE — Assessment & Plan Note (Signed)
1.  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 Isidore Moos.  - We reviewed the results of the CT scan dated 07/12/2018 which showed clustered nodules in predominant bronchovascular distribution are improved from the prior chest CT with resolution of consolidation in the right middle lobe.  There are persistent clustered nodules in the right lower lobe, requiring careful attention. - He is set up for a CT scan in the first week of February by Dr. Isidore Moos.  2.  Right lung cancer: -Status post right upper lobectomy on 10/15/2010, 1.6 cm, negative margins, 0 out of 4 positive lymph nodes.  He continues to be in remission.  3.  Normocytic anemia: -Previous labs indicated iron deficiency.  He received Feraheme on 03/18/2018 and 03/25/2018. - We have reviewed labs today.  Hemoglobin improved to 12.9.  Ferritin is 118. -I plan to repeat these labs in 4 months.  He reports that he is not having any bleeding.

## 2018-07-23 NOTE — Progress Notes (Signed)
Maurice Orozco, [place] [id number]   CLINIC:  Medical Oncology/Hematology  PCP:  Redmond School, [place] [address] [phone]   REASON FOR VISIT:  Follow-up for lung cancer.  CURRENT THERAPY: Surveillance.  BRIEF ONCOLOGIC HISTORY:    Adenocarcinoma of lung ([place])   09/30/2010 Cancer Staging    CT of chest showed spiculated lesion    10/15/2010 Surgery    Right upper lobectomy by Dr. Arlyce Dice    10/16/2010 Remission       10/24/2013 Imaging    CT Chest-  Enlarging subpleural nodule in the left lower lobe, now 9 mm, with distortion of the overlying pleura. Finding is concerning for a small bronchogenic carcinoma    01/23/2014 Imaging    CT Chest- Left lower lobe nodule has enlarged from 11/01/2012 and is worrisome for primary bronchogenic carcinoma.     07/04/2014 - 07/10/2014 Radiation Therapy    SBRT by Dr. Isidore Moos in 3 fractions.    07/05/2015 Imaging    perifissural nodule in LLL appears slightly smaller with parenchymal opacity attrib to XRT, likely inflamm pathcy airspace dz more inferiorly in LLL, airspace dz in RLL resolved. no metastatic disease     Primary cancer of left lower lobe of lung ([place])   06/18/2014 Initial Diagnosis    Left lower lobe nodule suspicious for primary lung carcinoma    07/04/2014 - 07/10/2014 Radiation Therapy    SBRT    10/12/2014 Imaging    No acute findings within the chest. Decrease in size of left lower lobe perifissural nodule compatible with response to therapy. No new or progressive disease identified within the chest.      CANCER STAGING: Cancer Staging Adenocarcinoma of lung ([place]) Staging form: Lung, AJCC 7th Edition - Clinical: Stage IA (T1a, N0, M0) - Signed by Baird Cancer, PA on 05/06/2011    INTERVAL HISTORY:  Maurice Orozco 72 y.o. male returns for follow-up of his lung cancer and iron deficiency anemia.  He denies any change in his cough or hemoptysis.  He  had pneumonia a few months ago.  There was abnormal CT scan in August.  He had a follow-up CT scan done on 07/12/2018.  Denies any fevers, night sweats or weight loss.  Denies any bleeding per rectum or melena.  He continues to have chronic back pain.  Appetite is 100% and energy is 75%.   REVIEW OF SYSTEMS:  Review of Systems  Constitutional: Negative for fatigue.  Gastrointestinal: Negative for nausea.  Musculoskeletal: Positive for back pain.  Neurological: Negative for dizziness.  Hematological: Does not bruise/bleed easily.  All other systems reviewed and are negative.    PAST MEDICAL/SURGICAL HISTORY:  Past Medical History:  Diagnosis Date  . Adenocarcinoma of lung ([place]) 05/06/2011   rt lung/dx 2011/surg only  . Allergy   . Anxiety   . Arthritis    finger  . Back fracture    DUE TO AA IN 1970  . Chronic back pain   . 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 Essential hypertension 05/06/2011  . GERD (gastroesophageal reflux disease)   . High cholesterol   . High coronary artery calcium score Julye 2018   Agaston Score 1043. dense RCA calcification --Non-ischemic Myoview  . History of radiation therapy 07/04/14, 07/06/14, 07/10/14   LLL lung nodule/54 Gy/3 fx- SBRT  . Hypercholesterolemia 05/06/2011  . Pneumonia due to Streptococcus    HX. POST OPERATIVELY  .  Stress fx pelvis    DUE TO AA IN 1970   Past Surgical History:  Procedure Laterality Date  . BACK SURGERY  07/04/2017   fusion above previous surgery  . BACK SURGERY  2011   metal plate l-4,L5  . Coroanry Calcium Score  03/2017   1043. Noted especially in RCA. Dense calcification, recommend Myoview over Cor CTA.  Marland Kitchen LIPOMA EXCISION N/A 10/14/2017   Procedure: EXCISION LIPOMA ON BACK;  Surgeon: Virl Cagey, MD;  Location: [facility];  Service: General;  Laterality: N/A;  . LUNG LOBECTOMY  2012   RT UPPER LOBE  . NM MYOVIEW LTD  04/2017   EF 60%. Hypertensive response to  exercise. (6:21 min, 7.7 METs) No EKG changes. LOW RISK     SOCIAL HISTORY:  Social History   Socioeconomic History  . Marital status: Married    Spouse name: Not on file  . Number of children: 3  . Years of education: Not on file  . Highest education level: Not on file  Occupational History  . Not on file  Social Needs  . Financial resource strain: Not on file  . Food insecurity:    Worry: Not on file    Inability: Not on file  . Transportation needs:    Medical: No    Non-medical: No  Tobacco Use  . Smoking status: Former Smoker    Types: Cigarettes    Last attempt to quit: 09/08/2010    Years since quitting: 7.8  . Smokeless tobacco: Current User    Types: Chew  . Tobacco comment: still chews tobacco  Substance and Sexual Activity  . Alcohol use: No  . Drug use: No  . Sexual activity: Not Currently  Lifestyle  . Physical activity:    Days per week: Not on file    Minutes per session: Not on file  . Stress: Not on file  Relationships  . Social connections:    Talks on phone: Not on file    Gets together: Not on file    Attends religious service: Not on file    Active member of club or organization: Not on file    Attends meetings of clubs or organizations: Not on file    Relationship status: Not on file  . Intimate partner violence:    Fear of current or ex partner: No    Emotionally abused: No    Physically abused: No    Forced sexual activity: No  Other Topics Concern  . Not on file  Social History Narrative  . Not on file    FAMILY HISTORY:  Family History  Problem Relation Age of Onset  . Hypertension Mother   . Kidney disease Father   . Heart attack Brother   . Cancer Maternal Uncle        prostate cancer  . Cancer Maternal Uncle        prostate cancer  . Cancer Maternal Uncle        prostate cancer  . Cancer Maternal Uncle        prostate cancer    CURRENT MEDICATIONS:  Outpatient Encounter Medications as of 07/23/2018  Medication Sig   . albuterol (PROVENTIL HFA;VENTOLIN HFA) 108 (90 Base) MCG/ACT inhaler Inhale 2 puffs into the lungs every 4 (four) hours as needed for wheezing or shortness of breath.  . cimetidine (TAGAMET) 200 MG tablet Take 400 mg by mouth daily as needed (for heartburn).  . Cyanocobalamin (VITAMIN B-12 PO) Take 1 tablet  by mouth daily.  . fluticasone (FLONASE) 50 MCG/ACT nasal spray Place 2 sprays into both nostrils daily as needed for allergies.   . hydrochlorothiazide 25 MG tablet Take 12.5 mg by mouth daily.   Marland Kitchen HYDROcodone-acetaminophen (NORCO) 10-325 MG per tablet Take 1 tablet by mouth every 6 (six) hours as needed for moderate pain.   . Menthol-Methyl Salicylate (MUSCLE RUB EX) Apply 1 application topically daily as needed (for pain).  . methocarbamol (ROBAXIN) 500 MG tablet   . pantoprazole (PROTONIX) 40 MG tablet Take 40 mg by mouth daily.  Marland Kitchen senna-docusate (SENOKOT-S) 8.6-50 MG tablet Take by mouth.  . simvastatin (ZOCOR) 40 MG tablet Take 40 mg by mouth every evening.   . traZODone (DESYREL) 100 MG tablet   . [DISCONTINUED] acetaminophen (TYLENOL) 500 MG tablet Take 1,000 mg by mouth every 6 (six) hours as needed for moderate pain or headache.  . [DISCONTINUED] benzonatate (TESSALON) 100 MG capsule Take 1 capsule (100 mg total) by mouth 3 (three) times daily.  . celecoxib (CELEBREX) 200 MG capsule    No facility-administered encounter medications on file as of 07/23/2018.     ALLERGIES:  Allergies  Allergen Reactions  . Fentanyl Other (See Comments)    CAUSED HALLUCINATIONS/05-03-13 pt reports it was oral fentanyl that caused hallucinations CAUSED HALLUCINATIONS/05-03-13 pt reports it was oral fentanyl that caused hallucinations  . Gabapentin Other (See Comments)    Bones throbbed, headaches, weakness  . Aspirin Nausea Only     PHYSICAL EXAM:  ECOG Performance status: 1  Vitals:   07/23/18 0813  BP: (!) 147/68  Pulse: 79  Resp: 16  Temp: 98.2 F (36.8 C)  SpO2: 98%   Filed  Weights   07/23/18 0813  Weight: 152 lb 9.6 oz (69.2 kg)    Physical Exam HEENT: Oropharynx has no thrush. Chest: Bilaterally clear to auscultation. CVS: S1-S2 regular rate and rhythm. Abdomen: Soft nontender, no hepatospleno megaly. Extremities: No edema or cyanosis.  LABORATORY DATA:  I have reviewed the labs as listed.  CBC    Component Value Date/Time   WBC 8.5 07/12/2018 1254   RBC 4.07 (L) 07/12/2018 1254   HGB 12.9 (L) 07/12/2018 1254   HCT 40.3 07/12/2018 1254   PLT 238 07/12/2018 1254   MCV 99.0 07/12/2018 1254   MCH 31.7 07/12/2018 1254   MCHC 32.0 07/12/2018 1254   RDW 13.3 07/12/2018 1254   LYMPHSABS 1.2 07/12/2018 1254   MONOABS 0.4 07/12/2018 1254   EOSABS 0.0 07/12/2018 1254   BASOSABS 0.0 07/12/2018 1254   CMP Latest Ref Rng & Units 07/12/2018 04/22/2018 03/12/2018  Glucose 70 - 99 mg/dL 138(H) 104(H) 125(H)  BUN 8 - 23 mg/dL 23 23 25(H)  Creatinine 0.61 - 1.24 mg/dL 1.18 1.04 1.23  Sodium 135 - 145 mmol/L 137 135 139  Potassium 3.5 - 5.1 mmol/L 3.7 3.7 4.9  Chloride 98 - 111 mmol/L 103 98 103  CO2 22 - 32 mmol/L 25 26 28   Calcium 8.9 - 10.3 mg/dL 9.0 9.0 9.0  Total Protein 6.5 - 8.1 g/dL 7.1 7.6 6.9  Total Bilirubin 0.3 - 1.2 mg/dL 0.6 0.8 0.4  Alkaline Phos 38 - 126 U/L 68 102 71  AST 15 - 41 U/L 16 20 13(L)  ALT 0 - 44 U/L 13 46(H) 14       DIAGNOSTIC IMAGING:  I have independently reviewed images of the CT scan from 07/12/2018 and discussed with the patient.     ASSESSMENT & PLAN:  Adenocarcinoma of lung 1.  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 Isidore Moos.  - We reviewed the results of the CT scan dated 07/12/2018 which showed clustered nodules in predominant bronchovascular distribution are improved from the prior chest CT with resolution of consolidation in the right middle lobe.  There are persistent clustered nodules in the right lower  lobe, requiring careful attention. - He is set up for a CT scan in the first week of February by Dr. Isidore Moos.  2.  Right lung cancer: -Status post right upper lobectomy on 10/15/2010, 1.6 cm, negative margins, 0 out of 4 positive lymph nodes.  He continues to be in remission.  3.  Normocytic anemia: -Previous labs indicated iron deficiency.  He received Feraheme on 03/18/2018 and 03/25/2018. - We have reviewed labs today.  Hemoglobin improved to 12.9.  Ferritin is 118. -I plan to repeat these labs in 4 months.  He reports that he is not having any bleeding.      Orders placed this encounter:  Orders Placed This Encounter  Procedures  . Comprehensive metabolic panel  . CBC with Differential  . Iron and TIBC  . Ferritin  . Vitamin B12  . Folate      Derek Jack, MD [place] [phone]

## 2018-08-23 NOTE — Telephone Encounter (Signed)
Encounter error

## 2018-09-05 IMAGING — CT CT CHEST W/O CM
2 of 3 series · 15 of 36 positions shown, 18 images · non-contrast
Comparison: 03/23/2018

CLINICAL DATA: Recent pneumonia.  History of right upper lobectomy.

EXAM:
CT CHEST WITHOUT CONTRAST
TECHNIQUE: Multidetector CT imaging of the chest was performed following the
standard protocol without IV contrast.

[Series 2: thorax · axial · 0.68mm/px · z∈[+1213,+1491]mm · 12 of 165 slices shown, 15 images]
[im 13/165  mediastinal]
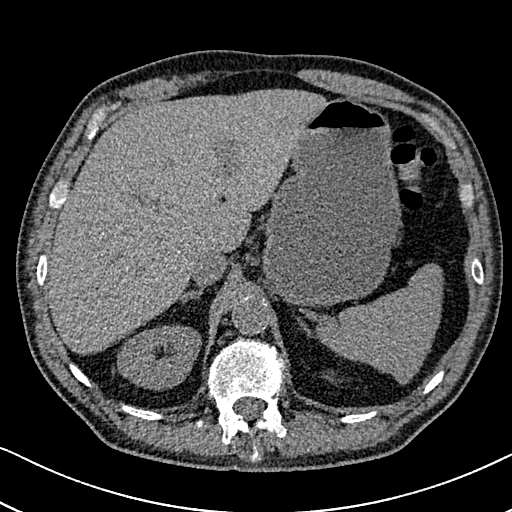
[im 13/165  lung]
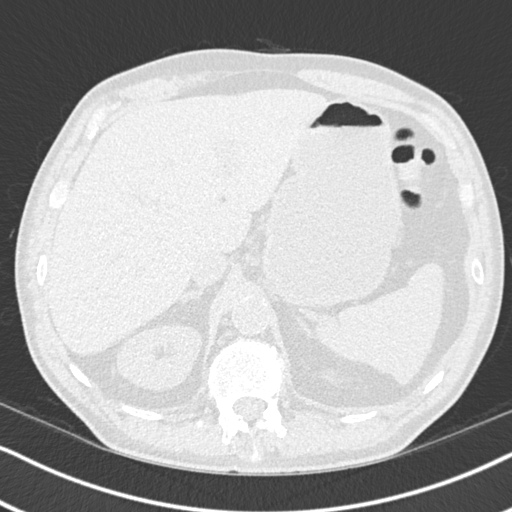
[im 25/165  lung]
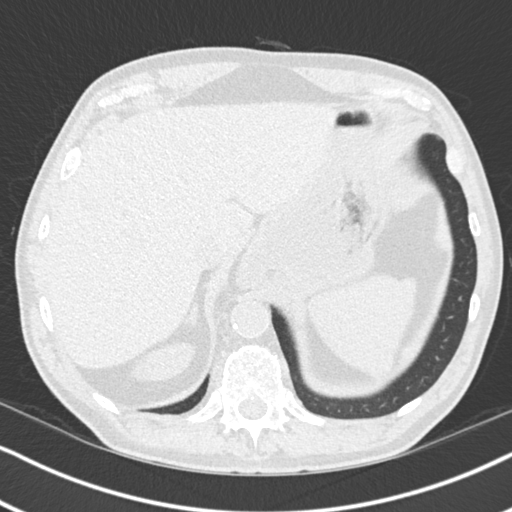
[im 37/165  lung]
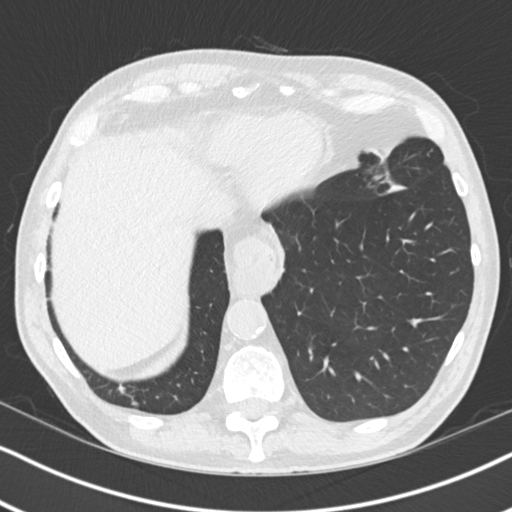
[im 49/165  lung]
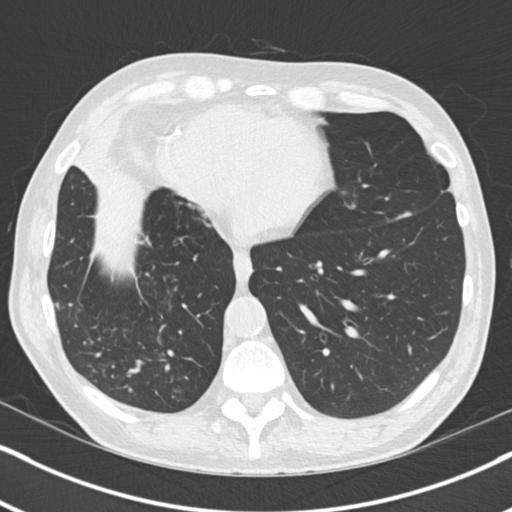
[im 61/165  mediastinal]
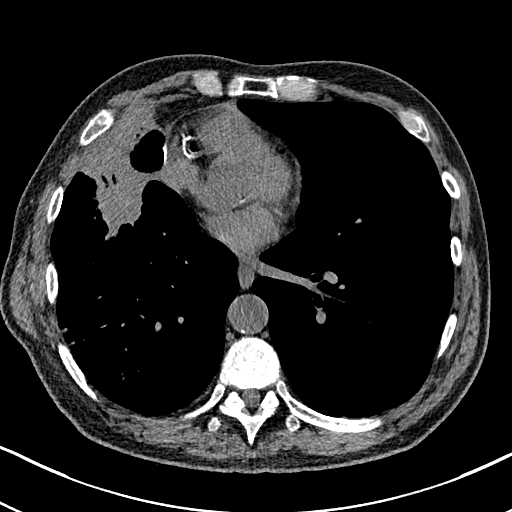
[im 61/165  lung]
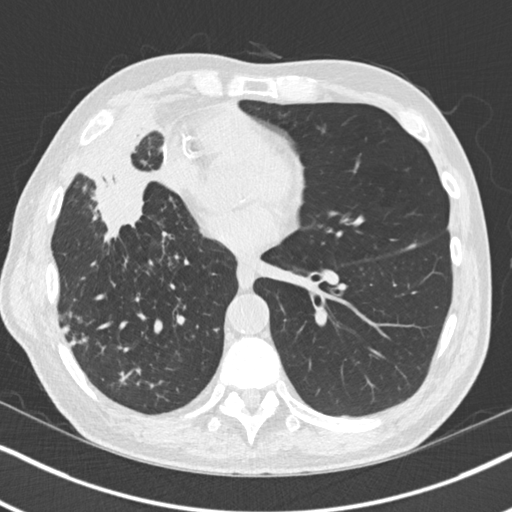
[im 73/165  lung]
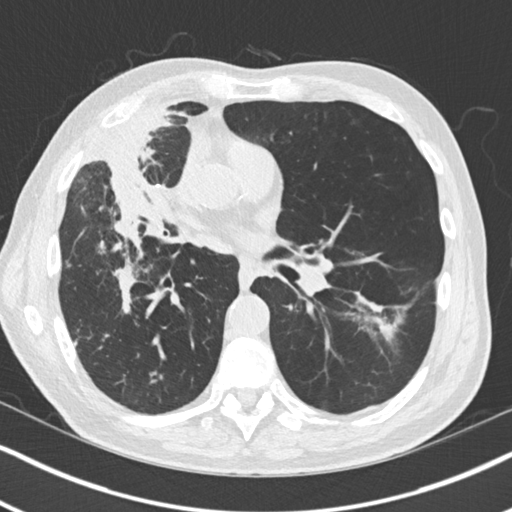
[im 92/165  lung]
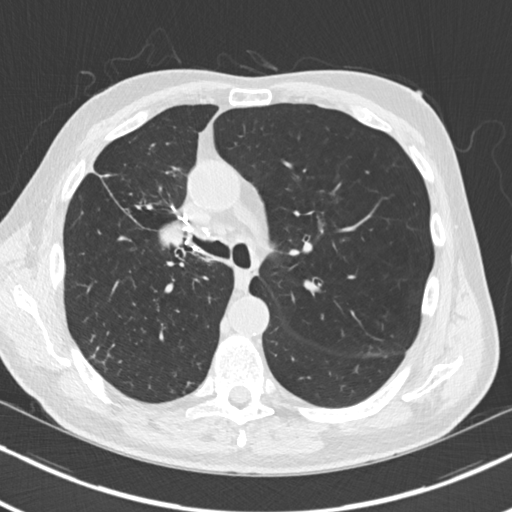
[im 104/165  lung]
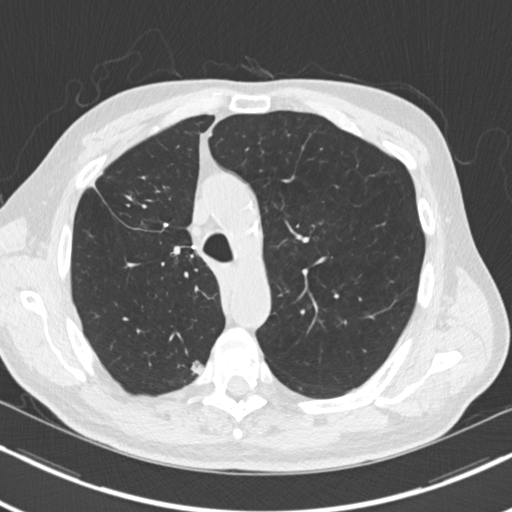
[im 116/165  mediastinal]
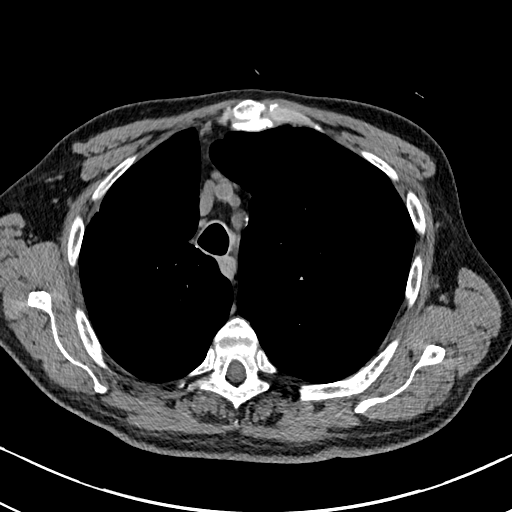
[im 116/165  lung]
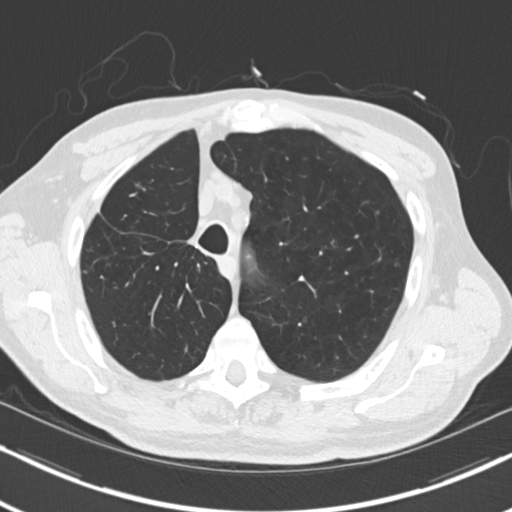
[im 128/165  lung]
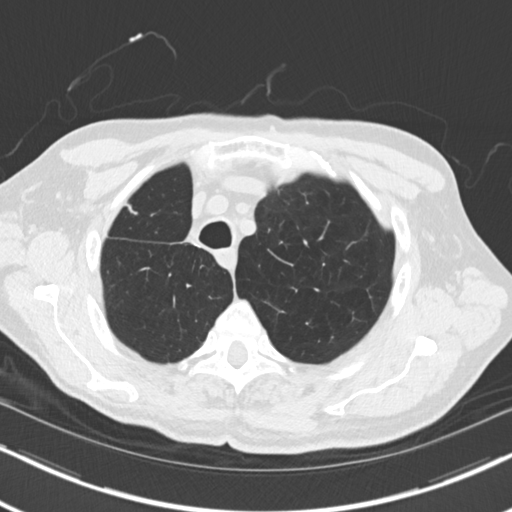
[im 140/165  lung]
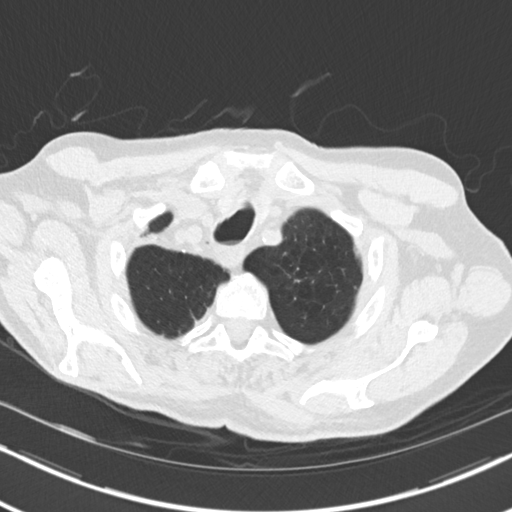
[im 152/165  lung]
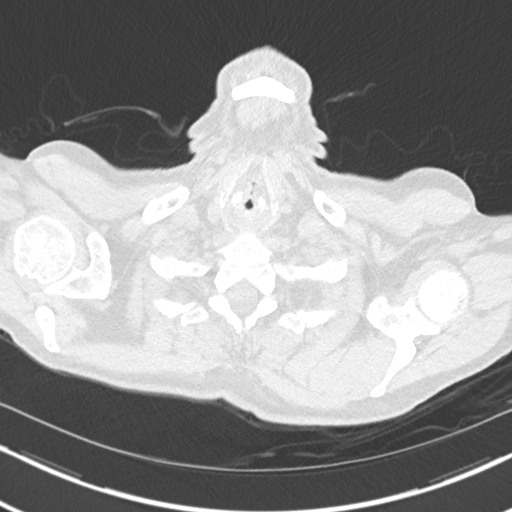

[Series 5: coronal · coronal · 0.65mm/px · 3 of 145 slices shown]
[im 29/145  lung]
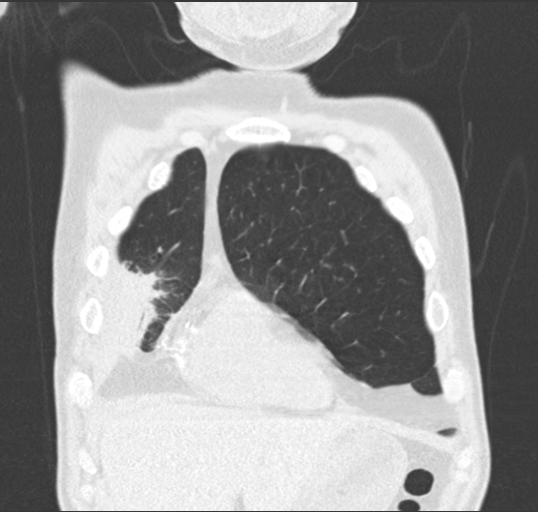
[im 58/145  lung]
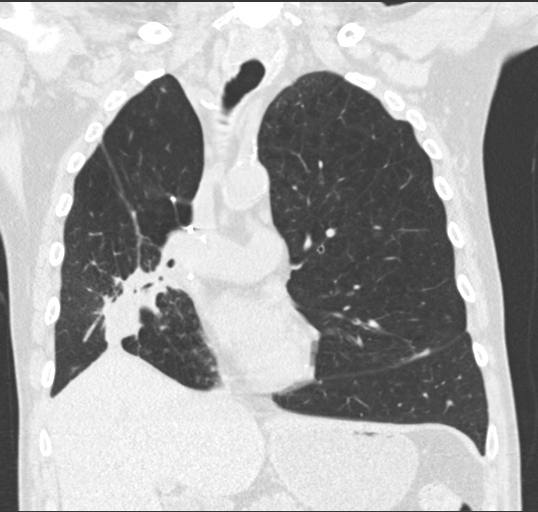
[im 87/145  lung]
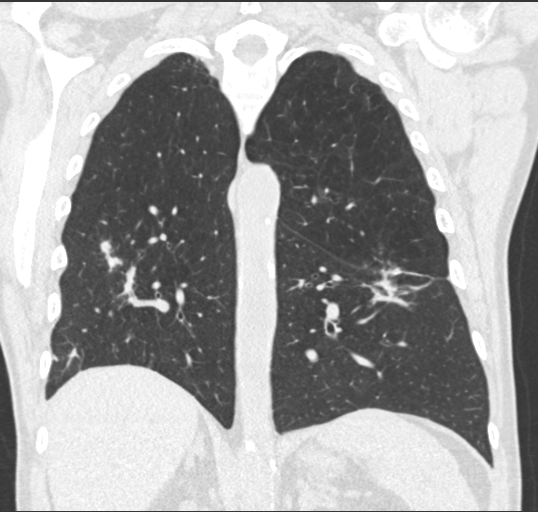

[15 of 36 positions shown; findings below may reference images not displayed]

FINDINGS: Cardiovascular: The heart size is normal. No substantial pericardial
effusion. Coronary artery calcification is evident. Atherosclerotic
calcification is noted in the wall of the thoracic aorta.

Mediastinum/Nodes: 8 mm right thoracic inlet lymph node measured
previously is stable. 13 mm short axis precarinal lymph node
measured previously is 12 mm on today's exam. 14 mm subcarinal lymph
node measured previously is now 8 mm in the right hilar lymph node
measured previously at 15 mm is now 14 mm. Small hiatal hernia. The
esophagus has normal imaging features. There is no axillary
lymphadenopathy.

Lungs/Pleura: The central tracheobronchial airways are patent.
Stable volume loss right hemithorax. Centrilobular and paraseptal
emphysema. Interstitial and patchy airspace disease seen in the
right lung has improved but has not resolved. Multiple nodular
components to the disease persist, including 14 mm nodule on image
102 of series 4. Evolution of consolidative changes in the anterior
right lung appears mildly progressive. Similar appearance of left
lower lobe nodule with adjacent scarring..

Upper Abdomen: Unremarkable.

Musculoskeletal: No worrisome lytic or sclerotic osseous
abnormality.
IMPRESSION: 1. Evolution of diffuse patchy and nodular airspace disease in the
right lung. Overall mild trend towards improvement while the main
consolidative area in the anterior right lung shows mild progression
posteriorly. Lymph nodes in the subcarinal region and right hilum
have decreased. Features may reflect resolving
infection/inflammation. Close attention will be required as neoplasm
not excluded.
2. Similar appearance of left lower lobe lung lesion with post
radiation scarring.
3. Anterior left lung tree-in-bud nodularity appears improved and
has nearly resolved.
4.  Aortic Atherosclerois ([id number]-170.0)
5.  Emphysema. ([id number])

## 2018-10-06 DIAGNOSIS — Z6825 Body mass index (BMI) 25.0-25.9, adult: Secondary | ICD-10-CM | POA: Diagnosis not present

## 2018-10-06 DIAGNOSIS — Z1389 Encounter for screening for other disorder: Secondary | ICD-10-CM | POA: Diagnosis not present

## 2018-10-06 DIAGNOSIS — C3432 Malignant neoplasm of lower lobe, left bronchus or lung: Secondary | ICD-10-CM | POA: Diagnosis not present

## 2018-10-06 DIAGNOSIS — I10 Essential (primary) hypertension: Secondary | ICD-10-CM | POA: Diagnosis not present

## 2018-10-06 DIAGNOSIS — G894 Chronic pain syndrome: Secondary | ICD-10-CM | POA: Diagnosis not present

## 2018-10-06 DIAGNOSIS — Z23 Encounter for immunization: Secondary | ICD-10-CM | POA: Diagnosis not present

## 2018-10-06 DIAGNOSIS — E663 Overweight: Secondary | ICD-10-CM | POA: Diagnosis not present

## 2018-10-06 DIAGNOSIS — J449 Chronic obstructive pulmonary disease, unspecified: Secondary | ICD-10-CM | POA: Diagnosis not present

## 2018-10-06 DIAGNOSIS — R0789 Other chest pain: Secondary | ICD-10-CM | POA: Diagnosis not present

## 2018-10-11 ENCOUNTER — Telehealth: Payer: Self-pay | Admitting: *Deleted

## 2018-10-11 NOTE — Telephone Encounter (Signed)
CALLED PATIENT TO INFORM OF CT FOR 10-14-18- ARRIVAL TIME - 6:45 PM @ [facility], NO RESTRICTIONS TO TEST, PT. TO GET RESULTS ON 10-15-18 @ 11:20 AM WITH DR. Isidore Orozco, SPOKE WITH PATIENT'S WIFE - Maurice Orozco AND SHE IS AWARE OF THESE APPTS.

## 2018-10-11 NOTE — Progress Notes (Signed)
Maurice Orozco presents for follow up of radiation completed 07/10/2014 to his left lower lobe. He last saw Dr. Delton Coombes on 07/23/18 and will see him again on 11/26/18. He is here for results of a recent CT Chest on 09/13/18. He reports pain of 6/10 to his back which is chronic. He reports a chronic cough and will occasionally have production of white sputum. He denies shortness of breath or other respiratory symptoms.   BP (!) 156/62 (BP Location: Left Arm, Patient Position: Sitting)   Pulse 71   Temp 97.9 F (36.6 C) (Oral)   Resp 18   Ht 5\' 6"  (1.676 m)   Wt 156 lb 4 oz (70.9 kg)   SpO2 97%   BMI 25.22 kg/m    Wt Readings from Last 3 Encounters:  10/15/18 156 lb 4 oz (70.9 kg)  07/23/18 152 lb 9.6 oz (69.2 kg)  07/13/18 152 lb (68.9 kg)

## 2018-10-14 ENCOUNTER — Ambulatory Visit (HOSPITAL_COMMUNITY)
Admission: RE | Admit: 2018-10-14 | Discharge: 2018-10-14 | Disposition: A | Discharge status: Home / Self Care | Payer: Medicare Other | Source: Ambulatory Visit | Attending: Radiation Oncology | Admitting: Radiation Oncology

## 2018-10-14 DIAGNOSIS — C3432 Malignant neoplasm of lower lobe, left bronchus or lung: Secondary | ICD-10-CM

## 2018-10-15 ENCOUNTER — Encounter: Payer: Self-pay | Admitting: Radiation Oncology

## 2018-10-15 ENCOUNTER — Ambulatory Visit
Admission: RE | Admit: 2018-10-15 | Discharge: 2018-10-15 | Disposition: A | Discharge status: Home / Self Care | Payer: Medicare Other | Source: Ambulatory Visit | Attending: Radiation Oncology | Admitting: Radiation Oncology

## 2018-10-15 ENCOUNTER — Other Ambulatory Visit: Payer: Self-pay

## 2018-10-15 VITALS — BP 156/62 | HR 71 | Temp 97.9°F | Resp 18 | Ht 66.0 in | Wt 156.2 lb

## 2018-10-15 DIAGNOSIS — Z923 Personal history of irradiation: Secondary | ICD-10-CM | POA: Diagnosis not present

## 2018-10-15 DIAGNOSIS — Z85118 Personal history of other malignant neoplasm of bronchus and lung: Secondary | ICD-10-CM | POA: Insufficient documentation

## 2018-10-15 DIAGNOSIS — J439 Emphysema, unspecified: Secondary | ICD-10-CM | POA: Insufficient documentation

## 2018-10-15 DIAGNOSIS — Z08 Encounter for follow-up examination after completed treatment for malignant neoplasm: Secondary | ICD-10-CM | POA: Diagnosis not present

## 2018-10-15 DIAGNOSIS — Z79899 Other long term (current) drug therapy: Secondary | ICD-10-CM | POA: Insufficient documentation

## 2018-10-15 DIAGNOSIS — I700 Atherosclerosis of aorta: Secondary | ICD-10-CM | POA: Diagnosis not present

## 2018-10-15 DIAGNOSIS — F17220 Nicotine dependence, chewing tobacco, uncomplicated: Secondary | ICD-10-CM | POA: Insufficient documentation

## 2018-10-15 DIAGNOSIS — C3490 Malignant neoplasm of unspecified part of unspecified bronchus or lung: Secondary | ICD-10-CM

## 2018-10-15 DIAGNOSIS — F17290 Nicotine dependence, other tobacco product, uncomplicated: Secondary | ICD-10-CM | POA: Diagnosis not present

## 2018-10-15 DIAGNOSIS — K449 Diaphragmatic hernia without obstruction or gangrene: Secondary | ICD-10-CM | POA: Diagnosis not present

## 2018-10-15 DIAGNOSIS — C3432 Malignant neoplasm of lower lobe, left bronchus or lung: Secondary | ICD-10-CM

## 2018-10-15 NOTE — Progress Notes (Signed)
Radiation Oncology         [phone] ________________________________  Name: Maurice Orozco MRN: [id number]  Date: 10/15/2018  DOB: Nov 12, 1945  Follow-Up Visit Note  Outpatient  CC: Redmond School, MD  Farrel Gobble, MD  Diagnosis and Prior Radiotherapy:    ICD-10-CM   1. Adenocarcinoma of lung, unspecified laterality ([place]) C34.90     Left lower lobe nodule suspicious for primary lung carcinoma, [id number] Stage IA 07/04/2014, 07/06/2014, 07/10/2014: 54 Gy directed to the left lower lung in 3 fractions  CHIEF COMPLAINT: Here for follow-up and surveillance of lung cancer  Narrative: [name] presents with his wife for follow up of radiation completed 07/10/2014 to his left lower lung. He underwent RUL lobectomy on 10/15/10 by Dr. Arlyce Dice. 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 No adjuvant therapy. I treated a LLL nodule with SBRT in 2015. He has done well since. He is a former smoker, currently chews tobacco.  Since his last visit to the office, he underwent a CT Chest wo contrast on 10/14/2018 that showed: Interval resolution of the peribronchovascular nodularity seen previously in the right lower lobe. Stable bilateral pulmonary parenchymal scarring. No new or progressive interval findings. Aortic Atherosclerosis. Emphysema.  He has reduced the amount of chewing tobacco that he used to use recently.   ALLERGIES:  is allergic to fentanyl; gabapentin; and aspirin.  Meds: Current Outpatient Medications  Medication Sig Dispense Refill  . albuterol (PROVENTIL HFA;VENTOLIN HFA) 108 (90 Base) MCG/ACT inhaler Inhale 2 puffs into the lungs every 4 (four) hours as needed for wheezing or shortness of breath. 2 Inhaler 3  . celecoxib (CELEBREX) 200 MG capsule     . cimetidine (TAGAMET) 200 MG tablet Take 400 mg by mouth daily as needed (for heartburn).    . Cyanocobalamin (VITAMIN B-12 PO) Take 1 tablet by mouth daily.    .  fluticasone (FLONASE) 50 MCG/ACT nasal spray Place 2 sprays into both nostrils daily as needed for allergies.     . hydrochlorothiazide 25 MG tablet Take 12.5 mg by mouth daily.     Marland Kitchen HYDROcodone-acetaminophen (NORCO) 10-325 MG per tablet Take 1 tablet by mouth every 6 (six) hours as needed for moderate pain.     . Menthol-Methyl Salicylate (MUSCLE RUB EX) Apply 1 application topically daily as needed (for pain).    . methocarbamol (ROBAXIN) 500 MG tablet     . pantoprazole (PROTONIX) 40 MG tablet Take 40 mg by mouth daily.    Marland Kitchen senna-docusate (SENOKOT-S) 8.6-50 MG tablet Take by mouth.    . simvastatin (ZOCOR) 40 MG tablet Take 40 mg by mouth every evening.     . traZODone (DESYREL) 100 MG tablet      No current facility-administered medications for this encounter.     Physical Findings:  height is 5\' 6"  (1.676 m) and weight is 156 lb 4 oz (70.9 kg). His oral temperature is 97.9 F (36.6 C). His blood pressure is 156/62 (abnormal) and his pulse is 71. His respiration is 18 and oxygen saturation is 97%.   General: Alert and oriented, in no acute distress. HEENT: Head is normocephalic. Dentures removed for oral exam. Oropharynx is clear. In the midline floor of mouth, he has two spots that show granulation tissue, each measuring 2-3 mm in size; in the left retromolar trigone, superficial area of granulation tissue that is thin and is a total length of 8 mm  Neck: He has a subcutaneous  mobile mass in the posterior left neck that is consisted with lipoma or cyst. Otherwise no masses appreciated. Heart: Regular in rate and rhythm with no murmurs, rubs, or gallops. Chest: Clear to auscultation bilaterally, with no rhonchi, wheezes, or rales. Abdomen: Soft, nontender, nondistended, with no rigidity or guarding.  Neurologic: Speech is fluent. Coordination is intact. Psychiatric: Judgment and insight are intact. Affect is appropriate.  Lab Findings: Lab Results  Component Value Date   WBC 8.5  07/12/2018   HGB 12.9 (L) 07/12/2018   HCT 40.3 07/12/2018   MCV 99.0 07/12/2018   PLT 238 07/12/2018    Radiographic Findings: Ct Chest Wo Contrast  Result Date: 10/15/2018 CLINICAL DATA:  Left lower lobe lung cancer. EXAM: CT CHEST WITHOUT CONTRAST TECHNIQUE: Multidetector CT imaging of the chest was performed following the standard protocol without IV contrast. COMPARISON:  07/12/2018 FINDINGS: Cardiovascular: The heart size is normal. No substantial pericardial effusion. Coronary artery calcification is evident. Atherosclerotic calcification is noted in the wall of the thoracic aorta. Mediastinum/Nodes: No mediastinal lymphadenopathy. No evidence for gross hilar lymphadenopathy although assessment is limited by the lack of intravenous contrast on today's study. The esophagus has normal imaging features. Small to moderate hiatal hernia noted. There is no axillary lymphadenopathy. Lungs/Pleura: The central tracheobronchial airways are patent. Centrilobular and paraseptal emphysema noted in the lungs bilaterally. Scarring in the parahilar left lung similar to prior. The 8 mm nodule measured on the prior study is about 9 mm today. Architectural distortion and scarring in the right upper lung is stable. Lingular scarring is unchanged. No new suspicious nodule or mass. No pleural effusion. Upper Abdomen: 13 mm exophytic lesion interpolar left kidney is been incompletely visualized but is unchanged in the interval. Musculoskeletal: No worrisome lytic or sclerotic osseous abnormality. IMPRESSION: 1. Interval resolution of the peribronchovascular nodularity seen previously in the right lower lobe. 2. Stable bilateral pulmonary parenchymal scarring. 3. No new or progressive interval findings. 4.  Aortic Atherosclerois (ICD10-170.0) 5.  Emphysema. ([id number]) Electronically Signed   By: Misty Stanley M.D.   On: 10/15/2018 08:08    Impression/Plan:    Chest CT results are reassuring and remains without  evidence of disease. He is closing in on his 5 year anniversary of his diagnosis and I recommend that he continue yearly Chest CT scans for surveillance with medical oncology. If he is eligible for low dose CT scans those will be appropriate. I will send a note to Dr. Delton Coombes about this. I wish him the best and I will be happy to see him back on a PRN basis.  I wished him the best and he knows to contact me if he needs me in the future.  I again urged him to stop using tobacco products as he still chews. _____________________________________   Eppie Gibson, MD  This document serves as a record of services personally performed by Eppie Gibson, MD. It was created on her behalf by Steva Colder, a trained medical scribe. The creation of this record is based on the scribe's personal observations and the provider's statements to them. This document has been checked and approved by the attending provider.

## 2018-11-01 ENCOUNTER — Encounter: Payer: Self-pay | Admitting: Cardiology

## 2018-11-01 ENCOUNTER — Ambulatory Visit (INDEPENDENT_AMBULATORY_CARE_PROVIDER_SITE_OTHER): Payer: Medicare Other | Admitting: Cardiology

## 2018-11-01 VITALS — BP 144/70 | HR 83 | Ht 66.0 in | Wt 160.0 lb

## 2018-11-01 DIAGNOSIS — I10 Essential (primary) hypertension: Secondary | ICD-10-CM | POA: Diagnosis not present

## 2018-11-01 DIAGNOSIS — E785 Hyperlipidemia, unspecified: Secondary | ICD-10-CM | POA: Diagnosis not present

## 2018-11-01 DIAGNOSIS — I2510 Atherosclerotic heart disease of native coronary artery without angina pectoris: Secondary | ICD-10-CM

## 2018-11-01 MED ORDER — ROSUVASTATIN CALCIUM 40 MG PO TABS: 40.0000 mg | ORAL_TABLET | Freq: Every day | ORAL | 3 refills | Status: DC

## 2018-11-01 NOTE — Progress Notes (Signed)
PCP: Redmond School, MD  Clinic Note: Chief Complaint  Patient presents with  . Follow-up    36-month  . Coronary Artery Disease    Nonobstructive based on elevated coronary calcium score with nonischemic Myoview.  Marland Kitchen COPD    Main cause for dyspnea    HPI: Maurice Orozco is a 73 y.o. male with a PMH of Significant Coronary Calcification (with Negative Myoview) who presents today for ~18 month follow-up.  Maurice Orozco was initially seen in consultation on 03/23/2017 for evaluation of 3V coronary Calcification noted on Screening CT Chest for h/o Lung Cancer.: Coronary Calcium Score 1043 -- Myoview stress test was non-ischemic. Aggressive med Rx recommended. --I last saw him in September 2018.  He had no major complaints besides baseline dyspnea from COPD but stable DOE.  No chest pain.  Noted chronic cough.  Was not able to start aspirin due to GI issues.  Recommend aggressive lipid management - deferred to PCP (I ordered a Lipid panel - not done).  Considered adding Bisoprolol.  No results found for: CHOL, HDL, LDLCALC, LDLDIRECT, TRIG, CHOLHDL  Recent Hospitalizations: n/a  Studies Personally Reviewed - (if available, images/films reviewed: From Epic Chart or Care Everywhere)  CT Chest 10/2018: again noted Coronary Calcification & Thoracic Aortic Atherosclerosis.  Interval History: Maurice Orozco returns today with no cardiac complaints. He has some baseline dyspnea with exertion from COPD, but no resting or exertional chest discomfort & DOE is stable. Still limited by back pain.   Walking 1/2 to 3/4 miles / day until last few weeks on TM. Just stopped - made excuses for not having enough time.  Also was walking outside until weather turned bad.  He had one episode about a month or 2 ago when he had some left shoulder pain with his heart racing after taking Celebrex.  Since he stopped taking Celebrex has not had any further symptoms.  Otherwise, he denies any resting exertional anginal type  chest pain.  He has baseline exertional dyspnea but does not necessarily notice it is worse with it unless he tries to rush upstairs or uphill.  He goes a slow steady rate, he can go for long period time without any symptoms.  He denies any PND, orthopnea or edema.  No rapid rate heartbeats palpitations with exception that one time of Celebrex.  No syncope/near syncope or TIA/amaurosis fugax.  No melena, hematochezia, hematuria or epistaxis.  No claudication.   ROS:  Review of Systems  Constitutional: Negative for malaise/fatigue.  Eyes:       No blurred vision, but did does have intermittent floaters.  Respiratory: Positive for cough and shortness of breath. Negative for wheezing.        COPD - stable Sx  Gastrointestinal: Positive for diarrhea, heartburn and nausea.       Has some symptoms of dysphagia with food getting stuck.  He also has off-and-on symptoms of nausea and and diarrhea.  It has the just what he tries to eat.  Musculoskeletal: Positive for back pain.   I have reviewed and (if needed) personally updated the patient's problem list, medications, allergies, past medical and surgical history, social and family history.   Past Medical History:  Diagnosis Date  . Adenocarcinoma of lung ([place]) 05/06/2011   rt lung/dx 2011/surg only  . Allergy   . Anxiety   . Arthritis    finger  . Back fracture    DUE TO AA IN 1970  . Chronic back pain   .  COPD (chronic obstructive pulmonary disease) with emphysema ([place]) 05/06/2011  . Essential hypertension 05/06/2011  . GERD (gastroesophageal reflux disease)   . High cholesterol   . High coronary artery calcium score Julye 2018   Agaston Score 1043. dense RCA calcification --Non-ischemic Myoview  . History of radiation therapy 07/04/14, 07/06/14, 07/10/14   LLL lung nodule/54 Gy/3 fx- SBRT  . Hypercholesterolemia 05/06/2011  . Pneumonia due to Streptococcus    HX. POST OPERATIVELY  . Stress fx pelvis    DUE TO AA IN 1970    Past  Surgical History:  Procedure Laterality Date  . BACK SURGERY  07/04/2017   fusion above previous surgery  . BACK SURGERY  2011   metal plate l-4,L5  . Coroanry Calcium Score  03/2017   1043. Noted especially in RCA. Dense calcification, recommend Myoview over Cor CTA.  Marland Kitchen LIPOMA EXCISION N/A 10/14/2017   Procedure: EXCISION LIPOMA ON BACK;  Surgeon: Virl Cagey, MD;  Location: [facility];  Service: General;  Laterality: N/A;  . LUNG LOBECTOMY  2012   RT UPPER LOBE  . NM MYOVIEW LTD  04/2017   EF 60%. Hypertensive response to exercise. (6:21 min, 7.7 METs) No EKG changes. LOW RISK    Current Meds  Medication Sig  . albuterol (PROVENTIL HFA;VENTOLIN HFA) 108 (90 Base) MCG/ACT inhaler Inhale 2 puffs into the lungs every 4 (four) hours as needed for wheezing or shortness of breath.  . celecoxib (CELEBREX) 200 MG capsule   . cimetidine (TAGAMET) 200 MG tablet Take 400 mg by mouth daily as needed (for heartburn).  . Cyanocobalamin (VITAMIN B-12 PO) Take 1 tablet by mouth daily.  . fluticasone (FLONASE) 50 MCG/ACT nasal spray Place 2 sprays into both nostrils daily as needed for allergies.   . hydrochlorothiazide 25 MG tablet Take 12.5 mg by mouth daily.   Marland Kitchen HYDROcodone-acetaminophen (NORCO) 10-325 MG per tablet Take 1 tablet by mouth every 6 (six) hours as needed for moderate pain.   . Menthol-Methyl Salicylate (MUSCLE RUB EX) Apply 1 application topically daily as needed (for pain).  . methocarbamol (ROBAXIN) 500 MG tablet   . pantoprazole (PROTONIX) 40 MG tablet Take 40 mg by mouth daily.  . polyethylene glycol (MIRALAX / GLYCOLAX) packet Take 17 g by mouth daily.  . traZODone (DESYREL) 100 MG tablet   . [DISCONTINUED] simvastatin (ZOCOR) 40 MG tablet Take 40 mg by mouth every evening.     Allergies  Allergen Reactions  . Fentanyl Other (See Comments)    CAUSED HALLUCINATIONS/05-03-13 pt reports it was oral fentanyl that caused hallucinations CAUSED HALLUCINATIONS/05-03-13 pt reports  it was oral fentanyl that caused hallucinations  . Gabapentin Other (See Comments)    Bones throbbed, headaches, weakness  . Aspirin Nausea Only    Social History   Tobacco Use  . Smoking status: Former Smoker    Types: Cigarettes    Last attempt to quit: 09/08/2010    Years since quitting: 8.1  . Smokeless tobacco: Current User    Types: Chew  . Tobacco comment: still chews tobacco, he tells me he hasn't in the past 2 days.   Substance Use Topics  . Alcohol use: No  . Drug use: No   Social History   Social History Narrative  . Not on file   Family History family history includes Cancer in his maternal uncle, maternal uncle, maternal uncle, and maternal uncle; Heart attack in his brother; Hypertension in his mother; Kidney disease in his father.  Wt Readings  from Last 3 Encounters:  11/01/18 160 lb (72.6 kg)  10/15/18 156 lb 4 oz (70.9 kg)  07/23/18 152 lb 9.6 oz (69.2 kg)    PHYSICAL EXAM BP (!) 144/70   Pulse 83   Ht 5\' 6"  (1.676 m)   Wt 160 lb (72.6 kg)   BMI 25.82 kg/m  Physical Exam  Constitutional: He is oriented to person, place, and time. He appears well-developed and well-nourished. No distress.  Well groomed.  HENT:  Head: Normocephalic and atraumatic.  Mouth/Throat: Oropharynx is clear and moist.  Has swimmy headed sensation with a squishing sensation when he turns his head  Neck: No hepatojugular reflux and no JVD present. Carotid bruit is not present.  Cardiovascular: Normal rate, regular rhythm, normal heart sounds and intact distal pulses.  No extrasystoles are present. Exam reveals no gallop and no friction rub.  No murmur heard. Pulmonary/Chest: Effort normal. No respiratory distress. He has no wheezes. He has no rales.  Distant breath sounds with mild interstitial sounds.    Abdominal: Soft. Bowel sounds are normal. He exhibits no distension. There is no abdominal tenderness. There is no rebound.  Musculoskeletal: Normal range of motion.         General: No edema.  Neurological: He is alert and oriented to person, place, and time.  Skin: Skin is warm and dry. No rash noted. No erythema.  Psychiatric: He has a normal mood and affect. His behavior is normal. Judgment and thought content normal.  Vitals reviewed.    Adult ECG Report n/a  Other studies Reviewed: Additional studies/ records that were reviewed today include:  Recent Labs:   Lab Results  Component Value Date   CREATININE 1.18 07/12/2018   BUN 23 07/12/2018   NA 137 07/12/2018   K 3.7 07/12/2018   CL 103 07/12/2018   CO2 25 07/12/2018  No results found for: CHOL, HDL, LDLCALC, LDLDIRECT, TRIG, CHOLHDL  June 2019: TC 205, TG 161, HDL 34, LDL 139.  A1c 6.9.  Hgb 12.9.  Cr 1.18.  K+ 3.7.  TSH 1.28.  ASSESSMENT / PLAN: Problem List Items Addressed This Visit    Coronary artery calcification seen on CAT scan - Primary (Chronic)    Quite high coronary calcium score with dense calcification in the RCA, but nonischemic Myoview. He does not seem to have that much in the way of any anginal symptoms. Plan: Continue aggressive risk modification:   At the first sign of symptoms, would start Plavix.  On simvastatin, will convert to Crestor.  Not on beta-blocker because of COPD -however, with his blood pressure started to climb up, may consider bisoprolol.      Relevant Medications   rosuvastatin (CRESTOR) 40 MG tablet   Other Relevant Orders   EKG 12-Lead (Completed)   Dyslipidemia, goal LDL below 70 (Chronic)    LDL of 139 given his extent of chronic calcification in the smoker.  We need to be much more aggressive with lipid management. Plan: Convert from simvastatin to rosuvastatin 40 mg.  Low threshold to add Zetia after follow-up labs.      Relevant Medications   rosuvastatin (CRESTOR) 40 MG tablet   Essential hypertension (Chronic)    He is only on HCTZ, blood pressure is trending up, would add bisoprolol low-dose and then possibly consider ARB over ACE  inhibitor.      Relevant Medications   rosuvastatin (CRESTOR) 40 MG tablet   Other Relevant Orders   EKG 12-Lead (Completed)  Current medicines are reviewed at length with the patient today. (+/- concerns) n/a The following changes have been made: n/a  Patient Instructions  Medication Instructions:    COMPLETE THE BOTTLE OF  SIMVASTATIN THEN STOP THEN BEGIN TAKING ROSUVASTATIN 40 MG ONE TABLET DAILY  If you need a refill on your cardiac medications before your next appointment, please call your pharmacy.   Lab work:  NOT NEEDED PLEASE HAVE PRIMARY TO SEND  UPCOMING LABS FROM THIS YEAR  If you have labs (blood work) drawn today and your tests are completely normal, you will receive your results only by: Marland Kitchen MyChart Message (if you have MyChart) OR . A paper copy in the mail If you have any lab test that is abnormal or we need to change your treatment, we will call you to review the results.  Testing/Procedures:  NOT NEEDED   Follow-Up: At [facility], you and your health needs are our priority.  As part of our continuing mission to provide you with exceptional heart care, we have created designated Provider Care Teams.  These Care Teams include your primary Cardiologist (physician) and Advanced Practice Providers (APPs -  Physician Assistants and Nurse Practitioners) who all work together to provide you with the care you need, when you need it. You will need a follow up appointment in 12 months  Feb 2021.  Please call our office 2 months in advance to schedule this appointment.  You may see Glenetta Hew, MD   or one of the following Advanced Practice Providers on your designated Care Team:   Rosaria Ferries, PA-C . Jory Sims, DNP, ANP  Any Other Special Instructions Will Be Listed Below (If Applicable).     Studies Ordered:   Orders Placed This Encounter  Procedures  . EKG 12-Lead      Glenetta Hew, M.D., M.S. Interventional Cardiologist    Pager # [phone] Phone # [phone] [address]. [place], [place] [address]

## 2018-11-01 NOTE — Patient Instructions (Signed)
Medication Instructions:    COMPLETE THE BOTTLE OF  SIMVASTATIN THEN STOP THEN BEGIN TAKING ROSUVASTATIN 40 MG ONE TABLET DAILY  If you need a refill on your cardiac medications before your next appointment, please call your pharmacy.   Lab work:  NOT NEEDED PLEASE HAVE PRIMARY TO SEND  UPCOMING LABS FROM THIS YEAR  If you have labs (blood work) drawn today and your tests are completely normal, you will receive your results only by: Marland Kitchen MyChart Message (if you have MyChart) OR . A paper copy in the mail If you have any lab test that is abnormal or we need to change your treatment, we will call you to review the results.  Testing/Procedures:  NOT NEEDED   Follow-Up: At [facility], you and your health needs are our priority.  As part of our continuing mission to provide you with exceptional heart care, we have created designated Provider Care Teams.  These Care Teams include your primary Cardiologist (physician) and Advanced Practice Providers (APPs -  Physician Assistants and Nurse Practitioners) who all work together to provide you with the care you need, when you need it. You will need a follow up appointment in 12 months  Feb 2021.  Please call our office 2 months in advance to schedule this appointment.  You may see Glenetta Hew, MD   or one of the following Advanced Practice Providers on your designated Care Team:   Rosaria Ferries, PA-C . Jory Sims, DNP, ANP  Any Other Special Instructions Will Be Listed Below (If Applicable).

## 2018-11-03 ENCOUNTER — Encounter: Payer: Self-pay | Admitting: Cardiology

## 2018-11-03 NOTE — Assessment & Plan Note (Signed)
He is only on HCTZ, blood pressure is trending up, would add bisoprolol low-dose and then possibly consider ARB over ACE inhibitor.

## 2018-11-03 NOTE — Assessment & Plan Note (Signed)
LDL of 139 given his extent of chronic calcification in the smoker.  We need to be much more aggressive with lipid management. Plan: Convert from simvastatin to rosuvastatin 40 mg.  Low threshold to add Zetia after follow-up labs.

## 2018-11-03 NOTE — Assessment & Plan Note (Signed)
Quite high coronary calcium score with dense calcification in the RCA, but nonischemic Myoview. He does not seem to have that much in the way of any anginal symptoms. Plan: Continue aggressive risk modification:   At the first sign of symptoms, would start Plavix.  On simvastatin, will convert to Crestor.  Not on beta-blocker because of COPD -however, with his blood pressure started to climb up, may consider bisoprolol.

## 2018-11-04 DIAGNOSIS — I700 Atherosclerosis of aorta: Secondary | ICD-10-CM | POA: Diagnosis not present

## 2018-11-04 DIAGNOSIS — Z6825 Body mass index (BMI) 25.0-25.9, adult: Secondary | ICD-10-CM | POA: Diagnosis not present

## 2018-11-04 DIAGNOSIS — Z23 Encounter for immunization: Secondary | ICD-10-CM | POA: Diagnosis not present

## 2018-11-04 DIAGNOSIS — E663 Overweight: Secondary | ICD-10-CM | POA: Diagnosis not present

## 2018-11-04 DIAGNOSIS — G894 Chronic pain syndrome: Secondary | ICD-10-CM | POA: Diagnosis not present

## 2018-11-16 ENCOUNTER — Encounter: Payer: Self-pay | Admitting: General Surgery

## 2018-11-16 ENCOUNTER — Ambulatory Visit (INDEPENDENT_AMBULATORY_CARE_PROVIDER_SITE_OTHER): Payer: Medicare Other | Admitting: General Surgery

## 2018-11-16 VITALS — BP 134/72 | HR 86 | Temp 97.1°F | Resp 22 | Wt 158.6 lb

## 2018-11-16 DIAGNOSIS — D367 Benign neoplasm of other specified sites: Secondary | ICD-10-CM

## 2018-11-16 NOTE — Progress Notes (Signed)
[facility] History and Physical  Reason for Referral: Sebaceous cyst/ lipoma on neck  Referring Physician:  Self Referral    Maurice Orozco is a 73 y.o. male.  HPI: Maurice Orozco is a 73 yo with a history of a lipoma removal from his back a few months back who comes in with a left posterior neck mass that is concerning for a cyst versus a lipoma. It was there during our prior exams and he says that he thinks it has gotten a little larger and is causing him to have headaches and neck pain. He says that his neck hurts when he sleeps on it.   He denies any other symptoms outside of the pain and denies any redness, drainage or swelling.  He has been doing fair otherwise. He is seen periodically by oncology due to this history of a adenocarcinoma of the lung. This area on his neck has been there for several years, and we discussed that it would be unlikely to be a lymph node or anything of that nature due to the longevity of the area.   Past Medical History:  Diagnosis Date  . 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 Allergy   . Anxiety   . Arthritis    finger  . Back fracture    DUE TO AA IN 1970  . Chronic back pain   . 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 Essential hypertension 05/06/2011  . GERD (gastroesophageal reflux disease)   . High cholesterol   . High coronary artery calcium score Julye 2018   Agaston Score 1043. dense RCA calcification --Non-ischemic Myoview  . History of radiation therapy 07/04/14, 07/06/14, 07/10/14   LLL lung nodule/54 Gy/3 fx- SBRT  . Hypercholesterolemia 05/06/2011  . Pneumonia due to Streptococcus    HX. POST OPERATIVELY  . Stress fx pelvis    DUE TO AA IN 1970    Past Surgical History:  Procedure Laterality Date  . BACK SURGERY  07/04/2017   fusion above previous surgery  . BACK SURGERY  2011   metal plate l-4,L5  . Coroanry Calcium Score  03/2017   1043.  Noted especially in RCA. Dense calcification, recommend Myoview over Cor CTA.  Marland Kitchen LIPOMA EXCISION N/A 10/14/2017   Procedure: EXCISION LIPOMA ON BACK;  Surgeon: Virl Cagey, MD;  Location: [facility];  Service: General;  Laterality: N/A;  . LUNG LOBECTOMY  2012   RT UPPER LOBE  . NM MYOVIEW LTD  04/2017   EF 60%. Hypertensive response to exercise. (6:21 min, 7.7 METs) No EKG changes. LOW RISK    Family History  Problem Relation Age of Onset  . Hypertension Mother   . Kidney disease Father   . Heart attack Brother   . Cancer Maternal Uncle        prostate cancer  . Cancer Maternal Uncle        prostate cancer  . Cancer Maternal Uncle        prostate cancer  . Cancer Maternal Uncle        prostate cancer    Social History   Tobacco Use  . Smoking status: Former Smoker    Types: Cigarettes    Last attempt to quit: 09/08/2010    Years since quitting: 8.1  . Smokeless tobacco: Current User    Types: Chew  . Tobacco comment: still chews tobacco, he tells me he hasn't in the past  2 days.   Substance Use Topics  . Alcohol use: No  . Drug use: No    Medications: I have reviewed the patient's current medications. Allergies as of 11/16/2018      Reactions   Fentanyl Other (See Comments)   CAUSED HALLUCINATIONS/05-03-13 pt reports it was oral fentanyl that caused hallucinations CAUSED HALLUCINATIONS/05-03-13 pt reports it was oral fentanyl that caused hallucinations   Gabapentin Other (See Comments)   Bones throbbed, headaches, weakness   Aspirin Nausea Only      Medication List       Accurate as of November 16, 2018  9:25 AM. Always use your most recent med list.        albuterol 108 (90 Base) MCG/ACT inhaler Commonly known as:  PROVENTIL HFA;VENTOLIN HFA Inhale 2 puffs into the lungs every 4 (four) hours as needed for wheezing or shortness of breath.   cimetidine 200 MG tablet Commonly known as:  TAGAMET Take 400 mg by mouth daily as needed (for heartburn).     fluticasone 50 MCG/ACT nasal spray Commonly known as:  FLONASE Place 2 sprays into both nostrils daily as needed for allergies.   hydrochlorothiazide 25 MG tablet Commonly known as:  HYDRODIURIL Take 12.5 mg by mouth daily.   HYDROcodone-acetaminophen 10-325 MG tablet Commonly known as:  NORCO Take 1 tablet by mouth every 6 (six) hours as needed for moderate pain.   methocarbamol 500 MG tablet Commonly known as:  ROBAXIN   MUSCLE RUB EX Apply 1 application topically daily as needed (for pain).   pantoprazole 40 MG tablet Commonly known as:  PROTONIX Take 40 mg by mouth daily.   polyethylene glycol packet Commonly known as:  MIRALAX / GLYCOLAX Take 17 g by mouth daily.   rosuvastatin 40 MG tablet Commonly known as:  CRESTOR Take 1 tablet (40 mg total) by mouth daily.        ROS:  A comprehensive review of systems was negative except for: Constitutional: positive for headaches Musculoskeletal: positive for neck pain and pain at the cyst site  Blood pressure 134/72, pulse 86, temperature (!) 97.1 F (36.2 C), temperature source Temporal, resp. rate (!) 22, weight 158 lb 9.6 oz (71.9 kg). Physical Exam Vitals signs reviewed.  Constitutional:      Appearance: Normal appearance.  HENT:     Head: Normocephalic and atraumatic.     Nose: Nose normal.     Mouth/Throat:     Mouth: Mucous membranes are moist.  Eyes:     Extraocular Movements: Extraocular movements intact.     Pupils: Pupils are equal, round, and reactive to light.  Neck:     Musculoskeletal: Normal range of motion. No neck rigidity.     Comments: About 1-1.5cm area superficial, mobile mass on the left posterior neck, mildly tender with palpation, no drainage or redness, no pits from prior drainage Cardiovascular:     Rate and Rhythm: Normal rate and regular rhythm.  Pulmonary:     Effort: Pulmonary effort is normal.     Breath sounds: Normal breath sounds.  Abdominal:     General: There is no  distension.     Palpations: Abdomen is soft.     Tenderness: There is no abdominal tenderness.  Musculoskeletal: Normal range of motion.        General: No swelling.  Skin:    General: Skin is warm and dry.  Neurological:     General: No focal deficit present.     Mental Status: He  is alert and oriented to person, place, and time.  Psychiatric:        Mood and Affect: Mood normal.        Behavior: Behavior normal.        Thought Content: Thought content normal.        Judgment: Judgment normal.    Results: None   Assessment & Plan:  Maurice Orozco is a 73 y.o. male with what is likely a sebaceous cyst versus a lipoma on his left posterior neck. It is superficial in nature and mobile. We have discussed that this is likely benign but he does have a history of adenocarcinoma of the lung.  We have discussed the risk and benefits of removal including but not limited to bleeding, infection, recurrence, nerve injury/ numbness, chance of this being a lymph node with cancer, and he has opted to proceed.   -Excision of cyst from posterior neck in the minor room with local   All questions were answered to the satisfaction of the patient and family.   Virl Cagey 11/16/2018, 9:25 AM

## 2018-11-17 ENCOUNTER — Encounter: Payer: Self-pay | Admitting: General Surgery

## 2018-11-17 DIAGNOSIS — D367 Benign neoplasm of other specified sites: Secondary | ICD-10-CM | POA: Insufficient documentation

## 2018-11-17 NOTE — H&P (Signed)
[facility] History and Physical  Reason for Referral: Sebaceous cyst/ lipoma on neck  Referring Physician:  Self Referral   Maurice Orozco is a 73 y.o. male.  HPI: Maurice Orozco is a 73 yo with a history of a lipoma removal from his back a few months back who comes in with a left posterior neck mass that is concerning for a cyst versus a lipoma. It was there during our prior exams and he says that he thinks it has gotten a little larger and is causing him to have headaches and neck pain. He says that his neck hurts when he sleeps on it.   He denies any other symptoms outside of the pain and denies any redness, drainage or swelling.  He has been doing fair otherwise. He is seen periodically by oncology due to this history of a adenocarcinoma of the lung. This area on his neck has been there for several years, and we discussed that it would be unlikely to be a lymph node or anything of that nature due to the longevity of the area.       Past Medical History:  Diagnosis Date  . 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 Allergy   . Anxiety   . Arthritis    finger  . Back fracture    DUE TO AA IN 1970  . Chronic back pain   . 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 Essential hypertension 05/06/2011  . GERD (gastroesophageal reflux disease)   . High cholesterol   . High coronary artery calcium score Julye 2018   Agaston Score 1043. dense RCA calcification --Non-ischemic Myoview  . History of radiation therapy 07/04/14, 07/06/14, 07/10/14   LLL lung nodule/54 Gy/3 fx- SBRT  . Hypercholesterolemia 05/06/2011  . Pneumonia due to Streptococcus    HX. POST OPERATIVELY  . Stress fx pelvis    DUE TO AA IN 1970         Past Surgical History:  Procedure Laterality Date  . BACK SURGERY  07/04/2017   fusion above previous surgery  . BACK SURGERY  2011   metal plate l-4,L5  .  Coroanry Calcium Score  03/2017   1043. Noted especially in RCA. Dense calcification, recommend Myoview over Cor CTA.  Marland Kitchen LIPOMA EXCISION N/A 10/14/2017   Procedure: EXCISION LIPOMA ON BACK;  Surgeon: Virl Cagey, MD;  Location: [facility];  Service: General;  Laterality: N/A;  . LUNG LOBECTOMY  2012   RT UPPER LOBE  . NM MYOVIEW LTD  04/2017   EF 60%. Hypertensive response to exercise. (6:21 min, 7.7 METs) No EKG changes. LOW RISK         Family History  Problem Relation Age of Onset  . Hypertension Mother   . Kidney disease Father   . Heart attack Brother   . Cancer Maternal Uncle        prostate cancer  . Cancer Maternal Uncle        prostate cancer  . Cancer Maternal Uncle        prostate cancer  . Cancer Maternal Uncle        prostate cancer    Social History        Tobacco Use  . Smoking status: Former Smoker    Types: Cigarettes    Last attempt to quit: 09/08/2010    Years since quitting: 8.1  . Smokeless tobacco: Current User  Types: Chew  . Tobacco comment: still chews tobacco, he tells me he hasn't in the past 2 days.   Substance Use Topics  . Alcohol use: No  . Drug use: No    Medications: I have reviewed the patient's current medications.      Allergies as of 11/16/2018      Reactions   Fentanyl Other (See Comments)   CAUSED HALLUCINATIONS/05-03-13 pt reports it was oral fentanyl that caused hallucinations CAUSED HALLUCINATIONS/05-03-13 pt reports it was oral fentanyl that caused hallucinations   Gabapentin Other (See Comments)   Bones throbbed, headaches, weakness   Aspirin Nausea Only         Medication List       Accurate as of November 16, 2018  9:25 AM. Always use your most recent med list.        albuterol 108 (90 Base) MCG/ACT inhaler Commonly known as:  PROVENTIL HFA;VENTOLIN HFA Inhale 2 puffs into the lungs every 4 (four) hours as needed for wheezing or shortness of breath.   cimetidine 200  MG tablet Commonly known as:  TAGAMET Take 400 mg by mouth daily as needed (for heartburn).   fluticasone 50 MCG/ACT nasal spray Commonly known as:  FLONASE Place 2 sprays into both nostrils daily as needed for allergies.   hydrochlorothiazide 25 MG tablet Commonly known as:  HYDRODIURIL Take 12.5 mg by mouth daily.   HYDROcodone-acetaminophen 10-325 MG tablet Commonly known as:  NORCO Take 1 tablet by mouth every 6 (six) hours as needed for moderate pain.   methocarbamol 500 MG tablet Commonly known as:  ROBAXIN   MUSCLE RUB EX Apply 1 application topically daily as needed (for pain).   pantoprazole 40 MG tablet Commonly known as:  PROTONIX Take 40 mg by mouth daily.   polyethylene glycol packet Commonly known as:  MIRALAX / GLYCOLAX Take 17 g by mouth daily.   rosuvastatin 40 MG tablet Commonly known as:  CRESTOR Take 1 tablet (40 mg total) by mouth daily.        ROS:  A comprehensive review of systems was negative except for: Constitutional: positive for headaches Musculoskeletal: positive for neck pain and pain at the cyst site  Blood pressure 134/72, pulse 86, temperature (!) 97.1 F (36.2 C), temperature source Temporal, resp. rate (!) 22, weight 158 lb 9.6 oz (71.9 kg). Physical Exam Vitals signs reviewed.  Constitutional:      Appearance: Normal appearance.  HENT:     Head: Normocephalic and atraumatic.     Nose: Nose normal.     Mouth/Throat:     Mouth: Mucous membranes are moist.  Eyes:     Extraocular Movements: Extraocular movements intact.     Pupils: Pupils are equal, round, and reactive to light.  Neck:     Musculoskeletal: Normal range of motion. No neck rigidity.     Comments: About 1-1.5cm area superficial, mobile mass on the left posterior neck, mildly tender with palpation, no drainage or redness, no pits from prior drainage Cardiovascular:     Rate and Rhythm: Normal rate and regular rhythm.  Pulmonary:     Effort:  Pulmonary effort is normal.     Breath sounds: Normal breath sounds.  Abdominal:     General: There is no distension.     Palpations: Abdomen is soft.     Tenderness: There is no abdominal tenderness.  Musculoskeletal: Normal range of motion.        General: No swelling.  Skin:  General: Skin is warm and dry.  Neurological:     General: No focal deficit present.     Mental Status: He is alert and oriented to person, place, and time.  Psychiatric:        Mood and Affect: Mood normal.        Behavior: Behavior normal.        Thought Content: Thought content normal.        Judgment: Judgment normal.    Results: None   Assessment & Plan:  Maurice Orozco is a 73 y.o. male with what is likely a sebaceous cyst versus a lipoma on his left posterior neck. It is superficial in nature and mobile. We have discussed that this is likely benign but he does have a history of adenocarcinoma of the lung.  We have discussed the risk and benefits of removal including but not limited to bleeding, infection, recurrence, nerve injury/ numbness, chance of this being a lymph node with cancer, and he has opted to proceed.   -Excision of cyst from posterior neck in the minor room with local   All questions were answered to the satisfaction of the patient and family.   Virl Cagey 11/16/2018, 9:25 AM

## 2018-11-19 ENCOUNTER — Encounter (HOSPITAL_COMMUNITY)
Admission: RE | Disposition: A | Discharge status: Home / Self Care | Payer: Self-pay | Source: Home / Self Care | Attending: General Surgery

## 2018-11-19 ENCOUNTER — Ambulatory Visit (HOSPITAL_COMMUNITY)
Admission: RE | Admit: 2018-11-19 | Discharge: 2018-11-19 | Disposition: A | Discharge status: Home / Self Care | Payer: Medicare Other | Attending: General Surgery | Admitting: General Surgery

## 2018-11-19 DIAGNOSIS — Z85118 Personal history of other malignant neoplasm of bronchus and lung: Secondary | ICD-10-CM | POA: Diagnosis not present

## 2018-11-19 DIAGNOSIS — Z902 Acquired absence of lung [part of]: Secondary | ICD-10-CM | POA: Diagnosis not present

## 2018-11-19 DIAGNOSIS — G8929 Other chronic pain: Secondary | ICD-10-CM | POA: Diagnosis not present

## 2018-11-19 DIAGNOSIS — Z841 Family history of disorders of kidney and ureter: Secondary | ICD-10-CM | POA: Diagnosis not present

## 2018-11-19 DIAGNOSIS — Z7951 Long term (current) use of inhaled steroids: Secondary | ICD-10-CM | POA: Insufficient documentation

## 2018-11-19 DIAGNOSIS — E7800 Pure hypercholesterolemia, unspecified: Secondary | ICD-10-CM | POA: Insufficient documentation

## 2018-11-19 DIAGNOSIS — F419 Anxiety disorder, unspecified: Secondary | ICD-10-CM | POA: Diagnosis not present

## 2018-11-19 DIAGNOSIS — M1990 Unspecified osteoarthritis, unspecified site: Secondary | ICD-10-CM | POA: Insufficient documentation

## 2018-11-19 DIAGNOSIS — D170 Benign lipomatous neoplasm of skin and subcutaneous tissue of head, face and neck: Secondary | ICD-10-CM | POA: Diagnosis not present

## 2018-11-19 DIAGNOSIS — J449 Chronic obstructive pulmonary disease, unspecified: Secondary | ICD-10-CM | POA: Diagnosis not present

## 2018-11-19 DIAGNOSIS — I10 Essential (primary) hypertension: Secondary | ICD-10-CM | POA: Diagnosis not present

## 2018-11-19 DIAGNOSIS — Z886 Allergy status to analgesic agent status: Secondary | ICD-10-CM | POA: Diagnosis not present

## 2018-11-19 DIAGNOSIS — Z885 Allergy status to narcotic agent status: Secondary | ICD-10-CM | POA: Diagnosis not present

## 2018-11-19 DIAGNOSIS — K219 Gastro-esophageal reflux disease without esophagitis: Secondary | ICD-10-CM | POA: Insufficient documentation

## 2018-11-19 DIAGNOSIS — M549 Dorsalgia, unspecified: Secondary | ICD-10-CM | POA: Insufficient documentation

## 2018-11-19 DIAGNOSIS — Z888 Allergy status to other drugs, medicaments and biological substances status: Secondary | ICD-10-CM | POA: Insufficient documentation

## 2018-11-19 DIAGNOSIS — Z87891 Personal history of nicotine dependence: Secondary | ICD-10-CM | POA: Insufficient documentation

## 2018-11-19 DIAGNOSIS — Z8042 Family history of malignant neoplasm of prostate: Secondary | ICD-10-CM | POA: Insufficient documentation

## 2018-11-19 DIAGNOSIS — Z79899 Other long term (current) drug therapy: Secondary | ICD-10-CM | POA: Diagnosis not present

## 2018-11-19 DIAGNOSIS — Z8249 Family history of ischemic heart disease and other diseases of the circulatory system: Secondary | ICD-10-CM | POA: Insufficient documentation

## 2018-11-19 DIAGNOSIS — L723 Sebaceous cyst: Secondary | ICD-10-CM | POA: Diagnosis present

## 2018-11-19 HISTORY — PX: LIPOMA EXCISION: SHX5283

## 2018-11-19 SURGERY — MINOR EXCISION LIPOMA
Anesthesia: LOCAL | Laterality: Left

## 2018-11-19 MED ORDER — LIDOCAINE HCL (PF) 1 % IJ SOLN
INTRAMUSCULAR | Status: DC | PRN
  Administered 2018-11-19: 3 mL via SUBCUTANEOUS

## 2018-11-19 MED ORDER — LIDOCAINE HCL (PF) 1 % IJ SOLN
INTRAMUSCULAR | Status: AC
  Filled 2018-11-19: qty 30

## 2018-11-19 SURGICAL SUPPLY — 21 items
ADH SKN CLS APL DERMABOND .7 (GAUZE/BANDAGES/DRESSINGS) ×1
DECANTER SPIKE VIAL GLASS SM (MISCELLANEOUS) ×1 IMPLANT
DERMABOND ADVANCED (GAUZE/BANDAGES/DRESSINGS) ×1
DERMABOND ADVANCED .7 DNX12 (GAUZE/BANDAGES/DRESSINGS) IMPLANT
DURAPREP 26ML APPLICATOR (WOUND CARE) ×1 IMPLANT
ELECT NDL TIP 2.8 STRL (NEEDLE) IMPLANT
ELECT NEEDLE TIP 2.8 STRL (NEEDLE) IMPLANT
GLOVE BIO SURGEON STRL SZ 6.5 (GLOVE) ×1 IMPLANT
GLOVE BIOGEL PI IND STRL 6.5 (GLOVE) IMPLANT
GLOVE BIOGEL PI IND STRL 7.0 (GLOVE) IMPLANT
GLOVE BIOGEL PI INDICATOR 6.5 (GLOVE) ×1
GLOVE BIOGEL PI INDICATOR 7.0 (GLOVE) ×1
NDL HYPO 25X1 1.5 SAFETY (NEEDLE) IMPLANT
NEEDLE HYPO 25X1 1.5 SAFETY (NEEDLE) ×2 IMPLANT
NS IRRIG 1000ML POUR BTL (IV SOLUTION) ×1 IMPLANT
SUT ETHILON 3 0 FSL (SUTURE) IMPLANT
SUT MNCRL AB 4-0 PS2 18 (SUTURE) ×1 IMPLANT
SUT PROLENE 4 0 PS 2 18 (SUTURE) IMPLANT
SUT VIC AB 3-0 SH 27 (SUTURE) ×2
SUT VIC AB 3-0 SH 27X BRD (SUTURE) IMPLANT
SYR CONTROL 10ML LL (SYRINGE) ×1 IMPLANT

## 2018-11-19 NOTE — Discharge Instructions (Signed)
Keep area clean and dry. You can take a shower in 24 hours. Do not submerge in water.  Take tylenol and ibuprofen for pain control and Norco for severe pain.   Lipoma  A lipoma is a noncancerous (benign) tumor that is made up of fat cells. This is a very common type of soft-tissue growth. Lipomas are usually found under the skin (subcutaneous). They may occur in any tissue of the body that contains fat. Common areas for lipomas to appear include the back, shoulders, buttocks, and thighs.  Lipomas grow slowly, and they are usually painless. Most lipomas do not cause problems and do not require treatment. What are the causes? The cause of this condition is not known. What increases the risk? You are more likely to develop this condition if:  You are 10-19 years old.  You have a family history of lipomas. What are the signs or symptoms? A lipoma usually appears as a small, round bump under the skin. In most cases, the lump will:  Feel soft or rubbery.  Not cause pain or other symptoms. However, if a lipoma is located in an area where it pushes on nerves, it can become painful or cause other symptoms. How is this diagnosed? A lipoma can usually be diagnosed with a physical exam. You may also have tests to confirm the diagnosis and to rule out other conditions. Tests may include:  Imaging tests, such as a CT scan or MRI.  Removal of a tissue sample to be looked at under a microscope (biopsy). How is this treated? Treatment for this condition depends on the size of the lipoma and whether it is causing any symptoms.  For small lipomas that are not causing problems, no treatment is needed.  If a lipoma is bigger or it causes problems, surgery may be done to remove the lipoma. Lipomas can also be removed to improve appearance. Most often, the procedure is done after applying a medicine that numbs the area (local anesthetic). Follow these instructions at home:  Watch your lipoma for any  changes.  Keep all follow-up visits as told by your health care provider. This is important. Contact a health care provider if:  Your lipoma becomes larger or hard.  Your lipoma becomes painful, red, or increasingly swollen. These could be signs of infection or a more serious condition. Get help right away if:  You develop tingling or numbness in an area near the lipoma. This could indicate that the lipoma is causing nerve damage. Summary  A lipoma is a noncancerous tumor that is made up of fat cells.  Most lipomas do not cause problems and do not require treatment.  If a lipoma is bigger or it causes problems, surgery may be done to remove the lipoma. This information is not intended to replace advice given to you by your health care provider. Make sure you discuss any questions you have with your health care provider. Document Released: 08/15/2002 Document Revised: 08/11/2017 Document Reviewed: 08/11/2017 Elsevier Interactive Patient Education  2019 [employer]  Lipoma Removal, Care After Refer to this sheet in the next few weeks. These instructions provide you with information about caring for yourself after your procedure. Your health care provider may also give you more specific instructions. Your treatment has been planned according to current medical practices, but problems sometimes occur. Call your health care provider if you have any problems or questions after your procedure. What can I expect after the procedure? After the procedure, it is common  to have:  Mild pain.  Swelling.  Bruising. Follow these instructions at home:  Bathing  Do not take baths, swim, or use a hot tub until your health care provider approves. Ask your health care provider if you can take showers. You may only be allowed to take sponge baths for bathing.  Keep your bandage (dressing) dry until your health care provider says it can be removed. Incision care   Follow instructions from your  health care provider about how to take care of your incision. Make sure you: ? Wash your hands with soap and water before you change your bandage (dressing). If soap and water are not available, use hand sanitizer. ? Change your dressing as told by your health care provider.  Leave stitches (sutures), skin glue, or adhesive strips in place.   You may shower.  Check your incision area every day for signs of infection. Check for: ? More redness, swelling, or pain. ? Fluid or blood. ? Warmth. ? Pus or a bad smell. Driving  Do not drive or operate heavy machinery while taking prescription pain medicine.  Otherwise you can drive.  General instructions  Take over-the-counter and prescription medicines only as told by your health care provider.  Do not use any tobacco products, such as cigarettes, chewing tobacco, and e-cigarettes. These can delay healing. If you need help quitting, ask your health care provider.  Return to your normal activities as told by your health care provider. Ask your health care provider what activities are safe for you.  Keep all follow-up visits as told by your health care provider. This is important. Contact a health care provider if:  You have more redness, swelling, or pain around your incision.  You have fluid or blood coming from your incision.  Your incision feels warm to the touch.  You have pus or a bad smell coming from your incision.  You have pain that does not get better with medicine. Get help right away if:  You have chills or a fever.  You have severe pain. This information is not intended to replace advice given to you by your health care provider. Make sure you discuss any questions you have with your health care provider. Document Released: 11/08/2015 Document Revised: 02/05/2016 Document Reviewed: 11/08/2015 Elsevier Interactive Patient Education  2019 [employer].

## 2018-11-19 NOTE — Op Note (Signed)
[facility] Operative Note  11/19/18  Preoperative Diagnosis:  Sebaceous cyst on left posterior neck    Postoperative Diagnosis: Lipoma of left posterior neck    Procedure(s) Performed: Excision of lipoma 1.5cm    Surgeon: Lanell Matar. Constance Haw, MD   Anesthesia: 1% lidocaine    Specimens: Lipoma    Estimated Blood Loss: Minimal   Wound Class:Clean    Operative Indications: Maurice Orozco is a 73 yo with a mass on his left posterior neck that we thought was a cyst due to the location and size.  He said that it had started to cause him some discomfort and possibly some tension headaches from pressure on the muscles of the neck. He wanted to get it removed. We discussed the risk and benefits of the procedure including but not limited to bleeding, recurrence, risk of infection, risk of this being a cancer/ lymph node, and he opted to proceed.   Findings: Hardened lipomatous mass    Procedure: The patient was taken to the procedure room and in a 30 degree upright position on his right side. The left neck was prepared and draped in the usual sterile fashion.   Lidocaine 1% was injected to numb the area. An incision was made in a natural skin crease across the mass that was about 1-1.5cm.  Blunt dissection and sharp dissection with Metzenbaum scissors was performed, and a 1.5cm lipomatous mass was removed. Hemostasis was achieved. The mass was superficial in nature. The cavity was closed with 3-0 vicryl and the skin was closed with 4-0 vicryl and dermabond.   The lipomatous mass was sent to pathology to confirm no worrisome features.   The patient tolerated the procedure without complication.  The patient went to the recovery area in good condition.    Maurice Labrum, MD [facility] [address] [place], [place] [address] [phone] (office)

## 2018-11-19 NOTE — Interval H&P Note (Signed)
History and Physical Interval Note:  11/19/2018 8:14 AM  Maurice Orozco  has presented today for surgery, with the diagnosis of sebaceous cyst neck.  The various methods of treatment have been discussed with the patient and family. After consideration of risks, benefits and other options for treatment, the patient has consented to  Procedure(s) with comments: MINOR EXCISION OF SEBACEOUS CYST NECK (Left) - pt knows to arrive at 7:00 as a surgical intervention.  The patient's history has been reviewed, patient examined, no change in status, stable for surgery.  I have reviewed the patient's chart and labs.  Questions were answered to the patient's satisfaction.    No changes.   Virl Cagey

## 2018-11-22 ENCOUNTER — Encounter (HOSPITAL_COMMUNITY): Payer: Self-pay | Admitting: General Surgery

## 2018-11-23 ENCOUNTER — Telehealth: Payer: Self-pay | Admitting: General Surgery

## 2018-11-23 NOTE — Telephone Encounter (Signed)
[facility]  Pathology back as lipoma. Updated patient.  Diagnosis Soft tissue, lipoma, neck - BENIGN MATURE ADIPOSE TISSUE, CONSISTENT WITH LIPOMA.  Curlene Labrum, MD [facility] [address] [place], [place] [address] [phone] (office)

## 2018-11-25 ENCOUNTER — Other Ambulatory Visit (HOSPITAL_COMMUNITY): Payer: Medicare Other

## 2018-11-26 ENCOUNTER — Ambulatory Visit (HOSPITAL_COMMUNITY): Payer: Medicare Other | Admitting: Hematology

## 2018-11-29 ENCOUNTER — Telehealth: Payer: Self-pay | Admitting: General Surgery

## 2018-11-29 ENCOUNTER — Telehealth: Payer: Self-pay | Admitting: Emergency Medicine

## 2018-11-29 NOTE — Telephone Encounter (Signed)
Cancelled appointment per provider due to corona virus we are unable to see patient in the office unless they are having problems

## 2018-11-29 NOTE — Telephone Encounter (Signed)
[facility]  The patient is being called or seen in a Webex encounter due to the current Covid 19 virus and attempts at limiting patient clinic visits in the spirit of social distancing.    Lipoma removed from his neck 3/13.   Pathology: Diagnosis Soft tissue, lipoma, neck - BENIGN MATURE ADIPOSE TISSUE, CONSISTENT WITH LIPOMA.  Patient neck is healing. Dermabond in place. No redness or drainage.  Will cancel the appt for Thursday.   Curlene Labrum, MD [facility] [address] [place], [place] [address] [phone] (office)

## 2018-12-01 DIAGNOSIS — J019 Acute sinusitis, unspecified: Secondary | ICD-10-CM | POA: Diagnosis not present

## 2018-12-01 DIAGNOSIS — G894 Chronic pain syndrome: Secondary | ICD-10-CM | POA: Diagnosis not present

## 2018-12-02 ENCOUNTER — Ambulatory Visit: Payer: Self-pay | Admitting: General Surgery

## 2018-12-30 DIAGNOSIS — G894 Chronic pain syndrome: Secondary | ICD-10-CM | POA: Diagnosis not present

## 2018-12-30 DIAGNOSIS — J329 Chronic sinusitis, unspecified: Secondary | ICD-10-CM | POA: Diagnosis not present

## 2019-01-21 ENCOUNTER — Other Ambulatory Visit: Payer: Self-pay

## 2019-01-21 ENCOUNTER — Inpatient Hospital Stay (HOSPITAL_COMMUNITY): Payer: Medicare Other | Attending: Hematology

## 2019-01-21 DIAGNOSIS — D649 Anemia, unspecified: Secondary | ICD-10-CM | POA: Diagnosis not present

## 2019-01-21 DIAGNOSIS — E538 Deficiency of other specified B group vitamins: Secondary | ICD-10-CM | POA: Diagnosis not present

## 2019-01-21 DIAGNOSIS — Z79899 Other long term (current) drug therapy: Secondary | ICD-10-CM | POA: Insufficient documentation

## 2019-01-21 DIAGNOSIS — D508 Other iron deficiency anemias: Secondary | ICD-10-CM

## 2019-01-21 DIAGNOSIS — C3412 Malignant neoplasm of upper lobe, left bronchus or lung: Secondary | ICD-10-CM | POA: Diagnosis not present

## 2019-01-21 LAB — CBC WITH DIFFERENTIAL/PLATELET
Abs Immature Granulocytes: 0.03 10*3/uL (ref 0.00–0.07)
Basophils Absolute: 0.1 10*3/uL (ref 0.0–0.1)
Basophils Relative: 1 %
Eosinophils Absolute: 0.2 10*3/uL (ref 0.0–0.5)
Eosinophils Relative: 2 %
HCT: 38.7 % — ABNORMAL LOW (ref 39.0–52.0)
Hemoglobin: 12.6 g/dL — ABNORMAL LOW (ref 13.0–17.0)
Immature Granulocytes: 0 %
Lymphocytes Relative: 22 %
Lymphs Abs: 1.7 10*3/uL (ref 0.7–4.0)
MCH: 33.4 pg (ref 26.0–34.0)
MCHC: 32.6 g/dL (ref 30.0–36.0)
MCV: 102.7 fL — ABNORMAL HIGH (ref 80.0–100.0)
Monocytes Absolute: 0.6 10*3/uL (ref 0.1–1.0)
Monocytes Relative: 8 %
Neutro Abs: 5.2 10*3/uL (ref 1.7–7.7)
Neutrophils Relative %: 67 %
Platelets: 261 10*3/uL (ref 150–400)
RBC: 3.77 MIL/uL — ABNORMAL LOW (ref 4.22–5.81)
RDW: 12.1 % (ref 11.5–15.5)
WBC: 7.7 10*3/uL (ref 4.0–10.5)
nRBC: 0 % (ref 0.0–0.2)

## 2019-01-21 LAB — COMPREHENSIVE METABOLIC PANEL
ALT: 18 U/L (ref 0–44)
AST: 17 U/L (ref 15–41)
Albumin: 3.7 g/dL (ref 3.5–5.0)
Alkaline Phosphatase: 65 U/L (ref 38–126)
Anion gap: 10 (ref 5–15)
BUN: 21 mg/dL (ref 8–23)
CO2: 25 mmol/L (ref 22–32)
Calcium: 8.5 mg/dL — ABNORMAL LOW (ref 8.9–10.3)
Chloride: 101 mmol/L (ref 98–111)
Creatinine, Ser: 1.07 mg/dL (ref 0.61–1.24)
GFR calc Af Amer: >60 mL/min (ref >60)
GFR calc non Af Amer: >60 mL/min (ref >60)
Glucose, Bld: 87 mg/dL (ref 70–99)
Potassium: 4 mmol/L (ref 3.5–5.1)
Sodium: 136 mmol/L (ref 135–145)
Total Bilirubin: 0.5 mg/dL (ref 0.3–1.2)
Total Protein: 6.8 g/dL (ref 6.5–8.1)

## 2019-01-21 LAB — FERRITIN: Ferritin: 78 ng/mL (ref 24–336)

## 2019-01-21 LAB — IRON AND TIBC
Iron: 66 ug/dL (ref 45–182)
Saturation Ratios: 20 % (ref 17.9–39.5)
TIBC: 336 ug/dL (ref 250–450)
UIBC: 270 ug/dL

## 2019-01-21 LAB — VITAMIN B12: Vitamin B-12: 260 pg/mL (ref 180–914)

## 2019-01-21 LAB — FOLATE: Folate: 6.7 ng/mL (ref >5.9)

## 2019-01-26 ENCOUNTER — Other Ambulatory Visit: Payer: Self-pay

## 2019-01-26 ENCOUNTER — Encounter (HOSPITAL_COMMUNITY): Payer: Self-pay | Admitting: Hematology

## 2019-01-26 ENCOUNTER — Inpatient Hospital Stay (HOSPITAL_BASED_OUTPATIENT_CLINIC_OR_DEPARTMENT_OTHER): Payer: Medicare Other | Admitting: Hematology

## 2019-01-26 DIAGNOSIS — D508 Other iron deficiency anemias: Secondary | ICD-10-CM | POA: Diagnosis not present

## 2019-01-26 NOTE — Progress Notes (Signed)
Virtual Visit via Telephone Note  I connected with Maurice Orozco on 01/26/19 at  2:50 PM EDT by telephone and verified that I am speaking with the correct person using two identifiers.   I discussed the limitations, risks, security and privacy concerns of performing an evaluation and management service by telephone and the availability of in person appointments. I also discussed with the patient that there may be a patient responsible charge related to this service. The patient expressed understanding and agreed to proceed.   History of Present Illness: 1.  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 2.  Normocytic anemia: -Last Feraheme infusion on 03/18/2018 and 03/25/2018.    Observations/Objective: - He denies any fevers, night sweats or weight loss.  Denies any change in his baseline cough or expectoration.  No hemoptysis.  No chest pains.  No recent hospitalizations.  Denies any bleeding per rectum or melena.  Denies any stool changes.  Appetite has been good.  Denies any chest pains or lightheadedness.  Denies any excessive tiredness.   Assessment and Plan:  1.  Normocytic anemia: - Last Feraheme infusion on 03/18/2018 and 03/25/2018. -I reviewed his blood work.  Hemoglobin is 12.7.  However ferritin has come down to 74.  I have recommended him to start taking iron tablet daily.  I have also recommended him to take stool softener daily with it. -I will see him back in 4 months for follow-up and repeat labs.  2.  B12 deficiency: -His B12 levels are borderline low at 260. -I have told him to start taking B12 1 mg tablet daily.  3.  Stage I left upper lobe lung adenocarcinoma: - I reviewed results of the CT scan dated 10/14/2018 which shows interval resolution of the peribronchovascular nodularity seen previously in the right lower lobe.  Stable bilateral parenchymal scarring.  No new or progressive findings. - He does not have any new  cough or hemoptysis.  I will do CT scans only if clinical condition dictates.   Follow Up Instructions:    I discussed the assessment and treatment plan with the patient. The patient was provided an opportunity to ask questions and all were answered. The patient agreed with the plan and demonstrated an understanding of the instructions.   The patient was advised to call back or seek an in-person evaluation if the symptoms worsen or if the condition fails to improve as anticipated.  I provided 12 minutes of non-face-to-face time during this encounter.   Derek Jack, MD

## 2019-01-28 ENCOUNTER — Ambulatory Visit (HOSPITAL_COMMUNITY): Payer: Medicare Other | Admitting: Hematology

## 2019-02-24 DIAGNOSIS — N419 Inflammatory disease of prostate, unspecified: Secondary | ICD-10-CM | POA: Diagnosis not present

## 2019-02-24 DIAGNOSIS — Z0001 Encounter for general adult medical examination with abnormal findings: Secondary | ICD-10-CM | POA: Diagnosis not present

## 2019-02-24 DIAGNOSIS — Z681 Body mass index (BMI) 19 or less, adult: Secondary | ICD-10-CM | POA: Diagnosis not present

## 2019-02-24 DIAGNOSIS — Z1389 Encounter for screening for other disorder: Secondary | ICD-10-CM | POA: Diagnosis not present

## 2019-02-24 DIAGNOSIS — G894 Chronic pain syndrome: Secondary | ICD-10-CM | POA: Diagnosis not present

## 2019-03-07 DIAGNOSIS — Z0000 Encounter for general adult medical examination without abnormal findings: Secondary | ICD-10-CM | POA: Diagnosis not present

## 2019-03-07 DIAGNOSIS — E7849 Other hyperlipidemia: Secondary | ICD-10-CM | POA: Diagnosis not present

## 2019-03-07 DIAGNOSIS — M549 Dorsalgia, unspecified: Secondary | ICD-10-CM | POA: Diagnosis not present

## 2019-03-07 DIAGNOSIS — E782 Mixed hyperlipidemia: Secondary | ICD-10-CM | POA: Diagnosis not present

## 2019-03-07 DIAGNOSIS — N419 Inflammatory disease of prostate, unspecified: Secondary | ICD-10-CM | POA: Diagnosis not present

## 2019-03-07 DIAGNOSIS — R7309 Other abnormal glucose: Secondary | ICD-10-CM | POA: Diagnosis not present

## 2019-03-07 DIAGNOSIS — E119 Type 2 diabetes mellitus without complications: Secondary | ICD-10-CM | POA: Diagnosis not present

## 2019-03-16 DIAGNOSIS — E663 Overweight: Secondary | ICD-10-CM | POA: Diagnosis not present

## 2019-03-16 DIAGNOSIS — H7090 Unspecified mastoiditis, unspecified ear: Secondary | ICD-10-CM | POA: Diagnosis not present

## 2019-03-16 DIAGNOSIS — Z1389 Encounter for screening for other disorder: Secondary | ICD-10-CM | POA: Diagnosis not present

## 2019-03-16 DIAGNOSIS — G894 Chronic pain syndrome: Secondary | ICD-10-CM | POA: Diagnosis not present

## 2019-03-16 DIAGNOSIS — Z6825 Body mass index (BMI) 25.0-25.9, adult: Secondary | ICD-10-CM | POA: Diagnosis not present

## 2019-04-22 DIAGNOSIS — G894 Chronic pain syndrome: Secondary | ICD-10-CM | POA: Diagnosis not present

## 2019-04-22 DIAGNOSIS — J449 Chronic obstructive pulmonary disease, unspecified: Secondary | ICD-10-CM | POA: Diagnosis not present

## 2019-04-22 DIAGNOSIS — I10 Essential (primary) hypertension: Secondary | ICD-10-CM | POA: Diagnosis not present

## 2019-04-22 DIAGNOSIS — Z6824 Body mass index (BMI) 24.0-24.9, adult: Secondary | ICD-10-CM | POA: Diagnosis not present

## 2019-05-23 ENCOUNTER — Other Ambulatory Visit: Payer: Self-pay

## 2019-05-23 ENCOUNTER — Inpatient Hospital Stay (HOSPITAL_COMMUNITY): Payer: Medicare Other | Attending: Hematology

## 2019-05-23 DIAGNOSIS — D508 Other iron deficiency anemias: Secondary | ICD-10-CM | POA: Insufficient documentation

## 2019-05-23 DIAGNOSIS — C3432 Malignant neoplasm of lower lobe, left bronchus or lung: Secondary | ICD-10-CM | POA: Diagnosis not present

## 2019-05-23 LAB — COMPREHENSIVE METABOLIC PANEL
ALT: 16 U/L (ref 0–44)
AST: 14 U/L — ABNORMAL LOW (ref 15–41)
Albumin: 4.2 g/dL (ref 3.5–5.0)
Alkaline Phosphatase: 59 U/L (ref 38–126)
Anion gap: 10 (ref 5–15)
BUN: 27 mg/dL — ABNORMAL HIGH (ref 8–23)
CO2: 25 mmol/L (ref 22–32)
Calcium: 8.8 mg/dL — ABNORMAL LOW (ref 8.9–10.3)
Chloride: 102 mmol/L (ref 98–111)
Creatinine, Ser: 0.93 mg/dL (ref 0.61–1.24)
GFR calc Af Amer: >60 mL/min (ref >60)
GFR calc non Af Amer: >60 mL/min (ref >60)
Glucose, Bld: 112 mg/dL — ABNORMAL HIGH (ref 70–99)
Potassium: 3.4 mmol/L — ABNORMAL LOW (ref 3.5–5.1)
Sodium: 137 mmol/L (ref 135–145)
Total Bilirubin: 0.7 mg/dL (ref 0.3–1.2)
Total Protein: 7 g/dL (ref 6.5–8.1)

## 2019-05-23 LAB — CBC WITH DIFFERENTIAL/PLATELET
Abs Immature Granulocytes: 0.05 10*3/uL (ref 0.00–0.07)
Basophils Absolute: 0.1 10*3/uL (ref 0.0–0.1)
Basophils Relative: 1 %
Eosinophils Absolute: 0.2 10*3/uL (ref 0.0–0.5)
Eosinophils Relative: 4 %
HCT: 40.5 % (ref 39.0–52.0)
Hemoglobin: 13 g/dL (ref 13.0–17.0)
Immature Granulocytes: 1 %
Lymphocytes Relative: 27 %
Lymphs Abs: 1.5 10*3/uL (ref 0.7–4.0)
MCH: 32.5 pg (ref 26.0–34.0)
MCHC: 32.1 g/dL (ref 30.0–36.0)
MCV: 101.3 fL — ABNORMAL HIGH (ref 80.0–100.0)
Monocytes Absolute: 0.4 10*3/uL (ref 0.1–1.0)
Monocytes Relative: 7 %
Neutro Abs: 3.5 10*3/uL (ref 1.7–7.7)
Neutrophils Relative %: 60 %
Platelets: 273 10*3/uL (ref 150–400)
RBC: 4 MIL/uL — ABNORMAL LOW (ref 4.22–5.81)
RDW: 12.5 % (ref 11.5–15.5)
WBC: 5.7 10*3/uL (ref 4.0–10.5)
nRBC: 0 % (ref 0.0–0.2)

## 2019-05-23 LAB — IRON AND TIBC
Iron: 101 ug/dL (ref 45–182)
Saturation Ratios: 27 % (ref 17.9–39.5)
TIBC: 381 ug/dL (ref 250–450)
UIBC: 280 ug/dL

## 2019-05-23 LAB — FERRITIN: Ferritin: 80 ng/mL (ref 24–336)

## 2019-05-23 LAB — FOLATE: Folate: 6.6 ng/mL (ref >5.9)

## 2019-05-23 LAB — VITAMIN B12: Vitamin B-12: 722 pg/mL (ref 180–914)

## 2019-05-24 DIAGNOSIS — Z6824 Body mass index (BMI) 24.0-24.9, adult: Secondary | ICD-10-CM | POA: Diagnosis not present

## 2019-05-24 DIAGNOSIS — J449 Chronic obstructive pulmonary disease, unspecified: Secondary | ICD-10-CM | POA: Diagnosis not present

## 2019-05-24 DIAGNOSIS — G894 Chronic pain syndrome: Secondary | ICD-10-CM | POA: Diagnosis not present

## 2019-05-24 DIAGNOSIS — K219 Gastro-esophageal reflux disease without esophagitis: Secondary | ICD-10-CM | POA: Diagnosis not present

## 2019-05-24 DIAGNOSIS — Z23 Encounter for immunization: Secondary | ICD-10-CM | POA: Diagnosis not present

## 2019-05-30 ENCOUNTER — Other Ambulatory Visit: Payer: Self-pay

## 2019-05-30 ENCOUNTER — Encounter (HOSPITAL_COMMUNITY): Payer: Self-pay | Admitting: Hematology

## 2019-05-30 ENCOUNTER — Inpatient Hospital Stay (HOSPITAL_BASED_OUTPATIENT_CLINIC_OR_DEPARTMENT_OTHER): Payer: Medicare Other | Admitting: Hematology

## 2019-05-30 DIAGNOSIS — C3490 Malignant neoplasm of unspecified part of unspecified bronchus or lung: Secondary | ICD-10-CM

## 2019-05-30 DIAGNOSIS — D508 Other iron deficiency anemias: Secondary | ICD-10-CM

## 2019-05-30 NOTE — Progress Notes (Signed)
Virtual Visit via Telephone Note  I connected with Maurice Orozco on 05/30/19 at  2:35 PM EDT by telephone and verified that I am speaking with the correct person using two identifiers.   I discussed the limitations, risks, security and privacy concerns of performing an evaluation and management service by telephone and the availability of in person appointments. I also discussed with the patient that there may be a patient responsible charge related to this service. The patient expressed understanding and agreed to proceed.   History of Present Illness: 1.  Normocytic anemia: - Last Feraheme infusion on 03/18/2018 and 03/15/2018.  2.  B12 deficiency: - He is taking vitamin B12 tablet daily.  3.  Stage Ia left lung adenocarcinoma: - Status post SBRT completed on 07/10/2014.  4.  Right lung cancer: -Status post right upper lobectomy on 10/15/2010.     Observations/Objective: He denies any fevers, night sweats or weight loss in the last 4 months.  Denies any cough or hemoptysis.  No chest pains reported.  No infections or hospitalizations.  He is tolerating iron tablet daily without any constipation.  No change in bowel habits.  Denies any nausea, vomiting, diarrhea or constipation.  Denies any bleeding per rectum or melena.  He is also taking vitamin B12 tablet daily.  Denies any tingling or numbness in extremities.   Assessment and Plan:  1.  Normocytic anemia: - We reviewed labs.  Hemoglobin is 13.  Ferritin is stable at 80.  B12 improved to 772.  Folic acid is normal. -he will continue iron tablet and B12 tablet daily. - I plan to repeat his labs in 6 months and see him back after that.  2.  Bilateral lung cancer: - Stage I left lung adenocarcinoma, status post SBRT completed on 07/10/2014.  Status post right upper lobectomy in February 2012. -As he is 5 years past both his cancers, we are not doing routine scans.  Last scan done on 10/14/2018 showed interval resolution of the  peribronchovascular nodularity seen previously in the right lower lobe.  Stable bilateral parenchymal scarring.  No new or progressive findings. -We will consider repeating scan only if clinical condition dictates.   Follow Up Instructions: RTC 6 months with labs 1 week prior.   I discussed the assessment and treatment plan with the patient. The patient was provided an opportunity to ask questions and all were answered. The patient agreed with the plan and demonstrated an understanding of the instructions.   The patient was advised to call back or seek an in-person evaluation if the symptoms worsen or if the condition fails to improve as anticipated.  I provided 15 minutes of non-face-to-face time during this encounter.   Derek Jack, MD

## 2019-06-08 DIAGNOSIS — E782 Mixed hyperlipidemia: Secondary | ICD-10-CM | POA: Diagnosis not present

## 2019-06-08 DIAGNOSIS — I10 Essential (primary) hypertension: Secondary | ICD-10-CM | POA: Diagnosis not present

## 2019-06-20 DIAGNOSIS — G894 Chronic pain syndrome: Secondary | ICD-10-CM | POA: Diagnosis not present

## 2019-06-20 DIAGNOSIS — Z6824 Body mass index (BMI) 24.0-24.9, adult: Secondary | ICD-10-CM | POA: Diagnosis not present

## 2019-06-20 DIAGNOSIS — M1388 Other specified arthritis, other site: Secondary | ICD-10-CM | POA: Diagnosis not present

## 2019-06-20 DIAGNOSIS — I10 Essential (primary) hypertension: Secondary | ICD-10-CM | POA: Diagnosis not present

## 2019-06-20 DIAGNOSIS — J449 Chronic obstructive pulmonary disease, unspecified: Secondary | ICD-10-CM | POA: Diagnosis not present

## 2019-07-19 DIAGNOSIS — G894 Chronic pain syndrome: Secondary | ICD-10-CM | POA: Diagnosis not present

## 2019-07-19 DIAGNOSIS — C3432 Malignant neoplasm of lower lobe, left bronchus or lung: Secondary | ICD-10-CM | POA: Diagnosis not present

## 2019-07-19 DIAGNOSIS — Z6823 Body mass index (BMI) 23.0-23.9, adult: Secondary | ICD-10-CM | POA: Diagnosis not present

## 2019-08-08 DIAGNOSIS — J449 Chronic obstructive pulmonary disease, unspecified: Secondary | ICD-10-CM | POA: Diagnosis not present

## 2019-08-08 DIAGNOSIS — I10 Essential (primary) hypertension: Secondary | ICD-10-CM | POA: Diagnosis not present

## 2019-08-08 DIAGNOSIS — E7849 Other hyperlipidemia: Secondary | ICD-10-CM | POA: Diagnosis not present

## 2019-08-15 DIAGNOSIS — G894 Chronic pain syndrome: Secondary | ICD-10-CM | POA: Diagnosis not present

## 2019-08-15 DIAGNOSIS — N342 Other urethritis: Secondary | ICD-10-CM | POA: Diagnosis not present

## 2019-08-15 DIAGNOSIS — Z6824 Body mass index (BMI) 24.0-24.9, adult: Secondary | ICD-10-CM | POA: Diagnosis not present

## 2019-09-08 DIAGNOSIS — J449 Chronic obstructive pulmonary disease, unspecified: Secondary | ICD-10-CM | POA: Diagnosis not present

## 2019-09-08 DIAGNOSIS — I10 Essential (primary) hypertension: Secondary | ICD-10-CM | POA: Diagnosis not present

## 2019-09-08 DIAGNOSIS — Z720 Tobacco use: Secondary | ICD-10-CM | POA: Diagnosis not present

## 2019-09-08 DIAGNOSIS — G894 Chronic pain syndrome: Secondary | ICD-10-CM | POA: Diagnosis not present

## 2019-10-09 DIAGNOSIS — G894 Chronic pain syndrome: Secondary | ICD-10-CM | POA: Diagnosis not present

## 2019-10-09 DIAGNOSIS — I10 Essential (primary) hypertension: Secondary | ICD-10-CM | POA: Diagnosis not present

## 2019-10-09 DIAGNOSIS — F4542 Pain disorder with related psychological factors: Secondary | ICD-10-CM | POA: Diagnosis not present

## 2019-10-09 DIAGNOSIS — M1991 Primary osteoarthritis, unspecified site: Secondary | ICD-10-CM | POA: Diagnosis not present

## 2019-10-11 DIAGNOSIS — M5136 Other intervertebral disc degeneration, lumbar region: Secondary | ICD-10-CM | POA: Diagnosis not present

## 2019-10-11 DIAGNOSIS — J019 Acute sinusitis, unspecified: Secondary | ICD-10-CM | POA: Diagnosis not present

## 2019-10-11 DIAGNOSIS — C3432 Malignant neoplasm of lower lobe, left bronchus or lung: Secondary | ICD-10-CM | POA: Diagnosis not present

## 2019-10-11 DIAGNOSIS — G894 Chronic pain syndrome: Secondary | ICD-10-CM | POA: Diagnosis not present

## 2019-11-01 DIAGNOSIS — Z20828 Contact with and (suspected) exposure to other viral communicable diseases: Secondary | ICD-10-CM | POA: Diagnosis not present

## 2019-11-03 DIAGNOSIS — Z23 Encounter for immunization: Secondary | ICD-10-CM | POA: Diagnosis not present

## 2019-11-07 DIAGNOSIS — G894 Chronic pain syndrome: Secondary | ICD-10-CM | POA: Diagnosis not present

## 2019-11-07 DIAGNOSIS — R197 Diarrhea, unspecified: Secondary | ICD-10-CM | POA: Diagnosis not present

## 2019-11-07 DIAGNOSIS — E7849 Other hyperlipidemia: Secondary | ICD-10-CM | POA: Diagnosis not present

## 2019-11-21 ENCOUNTER — Inpatient Hospital Stay (HOSPITAL_COMMUNITY): Payer: Medicare Other | Attending: Hematology

## 2019-11-21 DIAGNOSIS — D509 Iron deficiency anemia, unspecified: Secondary | ICD-10-CM | POA: Diagnosis not present

## 2019-11-21 DIAGNOSIS — C3490 Malignant neoplasm of unspecified part of unspecified bronchus or lung: Secondary | ICD-10-CM

## 2019-11-21 DIAGNOSIS — D508 Other iron deficiency anemias: Secondary | ICD-10-CM

## 2019-11-21 DIAGNOSIS — Z85118 Personal history of other malignant neoplasm of bronchus and lung: Secondary | ICD-10-CM | POA: Insufficient documentation

## 2019-11-21 DIAGNOSIS — E538 Deficiency of other specified B group vitamins: Secondary | ICD-10-CM | POA: Diagnosis not present

## 2019-11-21 LAB — CBC WITH DIFFERENTIAL/PLATELET
Abs Immature Granulocytes: 0.06 10*3/uL (ref 0.00–0.07)
Basophils Absolute: 0.1 10*3/uL (ref 0.0–0.1)
Basophils Relative: 1 %
Eosinophils Absolute: 0.2 10*3/uL (ref 0.0–0.5)
Eosinophils Relative: 2 %
HCT: 39.4 % (ref 39.0–52.0)
Hemoglobin: 13 g/dL (ref 13.0–17.0)
Immature Granulocytes: 1 %
Lymphocytes Relative: 24 %
Lymphs Abs: 1.9 10*3/uL (ref 0.7–4.0)
MCH: 33.9 pg (ref 26.0–34.0)
MCHC: 33 g/dL (ref 30.0–36.0)
MCV: 102.9 fL — ABNORMAL HIGH (ref 80.0–100.0)
Monocytes Absolute: 0.6 10*3/uL (ref 0.1–1.0)
Monocytes Relative: 7 %
Neutro Abs: 5.1 10*3/uL (ref 1.7–7.7)
Neutrophils Relative %: 65 %
Platelets: 235 10*3/uL (ref 150–400)
RBC: 3.83 MIL/uL — ABNORMAL LOW (ref 4.22–5.81)
RDW: 12.1 % (ref 11.5–15.5)
WBC: 7.9 10*3/uL (ref 4.0–10.5)
nRBC: 0 % (ref 0.0–0.2)

## 2019-11-21 LAB — COMPREHENSIVE METABOLIC PANEL
ALT: 17 U/L (ref 0–44)
AST: 15 U/L (ref 15–41)
Albumin: 4.3 g/dL (ref 3.5–5.0)
Alkaline Phosphatase: 58 U/L (ref 38–126)
Anion gap: 11 (ref 5–15)
BUN: 21 mg/dL (ref 8–23)
CO2: 27 mmol/L (ref 22–32)
Calcium: 9.3 mg/dL (ref 8.9–10.3)
Chloride: 97 mmol/L — ABNORMAL LOW (ref 98–111)
Creatinine, Ser: 1.09 mg/dL (ref 0.61–1.24)
GFR calc Af Amer: >60 mL/min (ref >60)
GFR calc non Af Amer: >60 mL/min (ref >60)
Glucose, Bld: 98 mg/dL (ref 70–99)
Potassium: 4 mmol/L (ref 3.5–5.1)
Sodium: 135 mmol/L (ref 135–145)
Total Bilirubin: 0.6 mg/dL (ref 0.3–1.2)
Total Protein: 7.2 g/dL (ref 6.5–8.1)

## 2019-11-21 LAB — IRON AND TIBC
Iron: 106 ug/dL (ref 45–182)
Saturation Ratios: 29 % (ref 17.9–39.5)
TIBC: 368 ug/dL (ref 250–450)
UIBC: 262 ug/dL

## 2019-11-21 LAB — FERRITIN: Ferritin: 107 ng/mL (ref 24–336)

## 2019-11-21 LAB — VITAMIN B12: Vitamin B-12: 993 pg/mL — ABNORMAL HIGH (ref 180–914)

## 2019-11-24 ENCOUNTER — Encounter: Payer: Self-pay | Admitting: General Practice

## 2019-11-28 ENCOUNTER — Inpatient Hospital Stay (HOSPITAL_BASED_OUTPATIENT_CLINIC_OR_DEPARTMENT_OTHER): Payer: Medicare Other | Admitting: Hematology

## 2019-11-28 ENCOUNTER — Other Ambulatory Visit: Payer: Self-pay

## 2019-11-28 ENCOUNTER — Encounter (HOSPITAL_COMMUNITY): Payer: Self-pay | Admitting: Hematology

## 2019-11-28 DIAGNOSIS — D508 Other iron deficiency anemias: Secondary | ICD-10-CM

## 2019-11-28 NOTE — Progress Notes (Signed)
Virtual Visit via Telephone Note  I connected with Maurice Orozco on 11/28/19 at  2:35 PM EDT by telephone and verified that I am speaking with the correct person using two identifiers.   I discussed the limitations, risks, security and privacy concerns of performing an evaluation and management service by telephone and the availability of in person appointments. I also discussed with the patient that there may be a patient responsible charge related to this service. The patient expressed understanding and agreed to proceed.   History of Present Illness: He is followed up in our clinic for normocytic anemia from iron deficiency and B12 deficiency.  He also had a history of bilateral lung cancer.  He underwent SBRT for stage I left lung cancer in 2015.  He underwent right upper lobectomy in February 2012.   Observations/Objective: He denies any bleeding per rectum or melena.  Appetite and energy levels are 75%.  Pain in the lower back is reported as 5 out of 10 and stable.  Reports some shortness of breath on exertion which is also stable.  Has on and off constipation, likely from pain medication.  He is continuing to take 1 iron tablet daily.  He also takes B12 tablet daily.  Denies any change in his cough or hemoptysis.  No new onset pains reported.  Assessment and Plan:  1.  Normocytic anemia: -We reviewed labs from 11/21/2019.  Ferritin is 107, previously 80.  Percent saturation is 29.  Hemoglobin is stable at 13.  MCV is elevated at 102.9.  Platelet count and white count is normal.  Creatinine is 1.09.  B12 level was 993. -He will continue iron tablet and a B12 tablet daily. -I plan to repeat his labs in 6 months.  2.  Bilateral lung cancer: -Stage I left lung adenocarcinoma, status post SBRT on 07/10/2014. -Status post right upper lobectomy in February 2012 for right lung cancer. -Last CT scan on 10/14/2018 showed interval resolution of the peribronchovascular nodularity seen previously in the  right lower lobe.  Stable bilateral parenchymal scarring.  No new or progressive findings. -He does not have any new symptoms suggestive of recurrence.  We will do scans if the clinical condition dictates.   Follow Up Instructions: RTC 6 months with labs 1 to 2 days prior.   I discussed the assessment and treatment plan with the patient. The patient was provided an opportunity to ask questions and all were answered. The patient agreed with the plan and demonstrated an understanding of the instructions.   The patient was advised to call back or seek an in-person evaluation if the symptoms worsen or if the condition fails to improve as anticipated.  I provided 12 minutes of non-face-to-face time during this encounter.   Derek Jack, MD

## 2019-12-01 DIAGNOSIS — Z23 Encounter for immunization: Secondary | ICD-10-CM | POA: Diagnosis not present

## 2019-12-05 DIAGNOSIS — K219 Gastro-esophageal reflux disease without esophagitis: Secondary | ICD-10-CM | POA: Diagnosis not present

## 2019-12-05 DIAGNOSIS — I700 Atherosclerosis of aorta: Secondary | ICD-10-CM | POA: Diagnosis not present

## 2019-12-05 DIAGNOSIS — M1991 Primary osteoarthritis, unspecified site: Secondary | ICD-10-CM | POA: Diagnosis not present

## 2019-12-05 DIAGNOSIS — G894 Chronic pain syndrome: Secondary | ICD-10-CM | POA: Diagnosis not present

## 2019-12-05 DIAGNOSIS — Z6825 Body mass index (BMI) 25.0-25.9, adult: Secondary | ICD-10-CM | POA: Diagnosis not present

## 2019-12-07 DIAGNOSIS — J449 Chronic obstructive pulmonary disease, unspecified: Secondary | ICD-10-CM | POA: Diagnosis not present

## 2019-12-07 DIAGNOSIS — I10 Essential (primary) hypertension: Secondary | ICD-10-CM | POA: Diagnosis not present

## 2019-12-07 DIAGNOSIS — E7849 Other hyperlipidemia: Secondary | ICD-10-CM | POA: Diagnosis not present

## 2019-12-07 DIAGNOSIS — Z720 Tobacco use: Secondary | ICD-10-CM | POA: Diagnosis not present

## 2020-01-06 DIAGNOSIS — E7849 Other hyperlipidemia: Secondary | ICD-10-CM | POA: Diagnosis not present

## 2020-01-06 DIAGNOSIS — G894 Chronic pain syndrome: Secondary | ICD-10-CM | POA: Diagnosis not present

## 2020-01-06 DIAGNOSIS — Z87891 Personal history of nicotine dependence: Secondary | ICD-10-CM | POA: Diagnosis not present

## 2020-01-06 DIAGNOSIS — I10 Essential (primary) hypertension: Secondary | ICD-10-CM | POA: Diagnosis not present

## 2020-01-06 DIAGNOSIS — J449 Chronic obstructive pulmonary disease, unspecified: Secondary | ICD-10-CM | POA: Diagnosis not present

## 2020-02-06 DIAGNOSIS — G894 Chronic pain syndrome: Secondary | ICD-10-CM | POA: Diagnosis not present

## 2020-02-06 DIAGNOSIS — Z87891 Personal history of nicotine dependence: Secondary | ICD-10-CM | POA: Diagnosis not present

## 2020-02-06 DIAGNOSIS — J449 Chronic obstructive pulmonary disease, unspecified: Secondary | ICD-10-CM | POA: Diagnosis not present

## 2020-02-06 DIAGNOSIS — I10 Essential (primary) hypertension: Secondary | ICD-10-CM | POA: Diagnosis not present

## 2020-02-06 DIAGNOSIS — E7849 Other hyperlipidemia: Secondary | ICD-10-CM | POA: Diagnosis not present

## 2020-03-02 DIAGNOSIS — E7849 Other hyperlipidemia: Secondary | ICD-10-CM | POA: Diagnosis not present

## 2020-03-02 DIAGNOSIS — Z0000 Encounter for general adult medical examination without abnormal findings: Secondary | ICD-10-CM | POA: Diagnosis not present

## 2020-03-02 DIAGNOSIS — I10 Essential (primary) hypertension: Secondary | ICD-10-CM | POA: Diagnosis not present

## 2020-03-02 DIAGNOSIS — R7309 Other abnormal glucose: Secondary | ICD-10-CM | POA: Diagnosis not present

## 2020-03-02 DIAGNOSIS — Z125 Encounter for screening for malignant neoplasm of prostate: Secondary | ICD-10-CM | POA: Diagnosis not present

## 2020-03-07 DIAGNOSIS — Z87891 Personal history of nicotine dependence: Secondary | ICD-10-CM | POA: Diagnosis not present

## 2020-03-07 DIAGNOSIS — I10 Essential (primary) hypertension: Secondary | ICD-10-CM | POA: Diagnosis not present

## 2020-03-07 DIAGNOSIS — J449 Chronic obstructive pulmonary disease, unspecified: Secondary | ICD-10-CM | POA: Diagnosis not present

## 2020-03-07 DIAGNOSIS — E7849 Other hyperlipidemia: Secondary | ICD-10-CM | POA: Diagnosis not present

## 2020-04-24 DIAGNOSIS — R509 Fever, unspecified: Secondary | ICD-10-CM | POA: Diagnosis not present

## 2020-04-24 DIAGNOSIS — J439 Emphysema, unspecified: Secondary | ICD-10-CM | POA: Diagnosis not present

## 2020-04-24 DIAGNOSIS — J449 Chronic obstructive pulmonary disease, unspecified: Secondary | ICD-10-CM | POA: Diagnosis not present

## 2020-04-24 DIAGNOSIS — Z23 Encounter for immunization: Secondary | ICD-10-CM | POA: Diagnosis not present

## 2020-04-24 DIAGNOSIS — G894 Chronic pain syndrome: Secondary | ICD-10-CM | POA: Diagnosis not present

## 2020-04-24 DIAGNOSIS — I10 Essential (primary) hypertension: Secondary | ICD-10-CM | POA: Diagnosis not present

## 2020-04-24 DIAGNOSIS — Z6823 Body mass index (BMI) 23.0-23.9, adult: Secondary | ICD-10-CM | POA: Diagnosis not present

## 2020-04-24 DIAGNOSIS — M1991 Primary osteoarthritis, unspecified site: Secondary | ICD-10-CM | POA: Diagnosis not present

## 2020-04-26 ENCOUNTER — Other Ambulatory Visit: Payer: Self-pay

## 2020-04-26 ENCOUNTER — Encounter (HOSPITAL_COMMUNITY): Payer: Self-pay | Admitting: Internal Medicine

## 2020-04-26 ENCOUNTER — Inpatient Hospital Stay (HOSPITAL_COMMUNITY)
Admission: EM | Admit: 2020-04-26 | Discharge: 2020-04-28 | DRG: 194 | Disposition: A | Discharge status: Home / Self Care | Payer: Medicare Other | Attending: Internal Medicine | Admitting: Internal Medicine

## 2020-04-26 ENCOUNTER — Emergency Department (HOSPITAL_COMMUNITY): Payer: Medicare Other

## 2020-04-26 DIAGNOSIS — Z20822 Contact with and (suspected) exposure to covid-19: Secondary | ICD-10-CM | POA: Diagnosis present

## 2020-04-26 DIAGNOSIS — R042 Hemoptysis: Secondary | ICD-10-CM | POA: Diagnosis not present

## 2020-04-26 DIAGNOSIS — E869 Volume depletion, unspecified: Secondary | ICD-10-CM | POA: Diagnosis present

## 2020-04-26 DIAGNOSIS — Z8042 Family history of malignant neoplasm of prostate: Secondary | ICD-10-CM

## 2020-04-26 DIAGNOSIS — E7800 Pure hypercholesterolemia, unspecified: Secondary | ICD-10-CM | POA: Diagnosis present

## 2020-04-26 DIAGNOSIS — I2510 Atherosclerotic heart disease of native coronary artery without angina pectoris: Secondary | ICD-10-CM | POA: Diagnosis present

## 2020-04-26 DIAGNOSIS — E871 Hypo-osmolality and hyponatremia: Secondary | ICD-10-CM | POA: Diagnosis present

## 2020-04-26 DIAGNOSIS — Z841 Family history of disorders of kidney and ureter: Secondary | ICD-10-CM | POA: Diagnosis not present

## 2020-04-26 DIAGNOSIS — K219 Gastro-esophageal reflux disease without esophagitis: Secondary | ICD-10-CM | POA: Diagnosis present

## 2020-04-26 DIAGNOSIS — Z791 Long term (current) use of non-steroidal anti-inflammatories (NSAID): Secondary | ICD-10-CM | POA: Diagnosis not present

## 2020-04-26 DIAGNOSIS — I10 Essential (primary) hypertension: Secondary | ICD-10-CM | POA: Diagnosis not present

## 2020-04-26 DIAGNOSIS — E785 Hyperlipidemia, unspecified: Secondary | ICD-10-CM | POA: Diagnosis present

## 2020-04-26 DIAGNOSIS — C3490 Malignant neoplasm of unspecified part of unspecified bronchus or lung: Secondary | ICD-10-CM | POA: Diagnosis present

## 2020-04-26 DIAGNOSIS — Z886 Allergy status to analgesic agent status: Secondary | ICD-10-CM

## 2020-04-26 DIAGNOSIS — Z79899 Other long term (current) drug therapy: Secondary | ICD-10-CM | POA: Diagnosis not present

## 2020-04-26 DIAGNOSIS — J440 Chronic obstructive pulmonary disease with acute lower respiratory infection: Secondary | ICD-10-CM | POA: Diagnosis present

## 2020-04-26 DIAGNOSIS — Z8249 Family history of ischemic heart disease and other diseases of the circulatory system: Secondary | ICD-10-CM | POA: Diagnosis not present

## 2020-04-26 DIAGNOSIS — Z85118 Personal history of other malignant neoplasm of bronchus and lung: Secondary | ICD-10-CM | POA: Diagnosis not present

## 2020-04-26 DIAGNOSIS — J441 Chronic obstructive pulmonary disease with (acute) exacerbation: Secondary | ICD-10-CM | POA: Diagnosis present

## 2020-04-26 DIAGNOSIS — D649 Anemia, unspecified: Secondary | ICD-10-CM

## 2020-04-26 DIAGNOSIS — J411 Mucopurulent chronic bronchitis: Secondary | ICD-10-CM | POA: Diagnosis not present

## 2020-04-26 DIAGNOSIS — Z87891 Personal history of nicotine dependence: Secondary | ICD-10-CM | POA: Diagnosis not present

## 2020-04-26 DIAGNOSIS — K449 Diaphragmatic hernia without obstruction or gangrene: Secondary | ICD-10-CM | POA: Diagnosis not present

## 2020-04-26 DIAGNOSIS — N179 Acute kidney failure, unspecified: Secondary | ICD-10-CM | POA: Diagnosis not present

## 2020-04-26 DIAGNOSIS — R079 Chest pain, unspecified: Secondary | ICD-10-CM | POA: Diagnosis not present

## 2020-04-26 DIAGNOSIS — Z888 Allergy status to other drugs, medicaments and biological substances status: Secondary | ICD-10-CM

## 2020-04-26 DIAGNOSIS — E876 Hypokalemia: Secondary | ICD-10-CM | POA: Diagnosis not present

## 2020-04-26 DIAGNOSIS — J189 Pneumonia, unspecified organism: Secondary | ICD-10-CM | POA: Diagnosis not present

## 2020-04-26 DIAGNOSIS — R9431 Abnormal electrocardiogram [ECG] [EKG]: Secondary | ICD-10-CM | POA: Diagnosis not present

## 2020-04-26 DIAGNOSIS — J449 Chronic obstructive pulmonary disease, unspecified: Secondary | ICD-10-CM | POA: Diagnosis present

## 2020-04-26 LAB — CBC
HCT: 33.4 % — ABNORMAL LOW (ref 39.0–52.0)
Hemoglobin: 10.7 g/dL — ABNORMAL LOW (ref 13.0–17.0)
MCH: 32.4 pg (ref 26.0–34.0)
MCHC: 32 g/dL (ref 30.0–36.0)
MCV: 101.2 fL — ABNORMAL HIGH (ref 80.0–100.0)
Platelets: 260 10*3/uL (ref 150–400)
RBC: 3.3 MIL/uL — ABNORMAL LOW (ref 4.22–5.81)
RDW: 12.7 % (ref 11.5–15.5)
WBC: 18.2 10*3/uL — ABNORMAL HIGH (ref 4.0–10.5)
nRBC: 0 % (ref 0.0–0.2)

## 2020-04-26 LAB — URINALYSIS, ROUTINE W REFLEX MICROSCOPIC
Bilirubin Urine: NEGATIVE
Glucose, UA: NEGATIVE mg/dL
Ketones, ur: NEGATIVE mg/dL
Nitrite: NEGATIVE
Protein, ur: NEGATIVE mg/dL
Specific Gravity, Urine: 1.026 (ref 1.005–1.030)
pH: 5 (ref 5.0–8.0)

## 2020-04-26 LAB — BASIC METABOLIC PANEL
Anion gap: 14 (ref 5–15)
BUN: 59 mg/dL — ABNORMAL HIGH (ref 8–23)
CO2: 23 mmol/L (ref 22–32)
Calcium: 8.2 mg/dL — ABNORMAL LOW (ref 8.9–10.3)
Chloride: 94 mmol/L — ABNORMAL LOW (ref 98–111)
Creatinine, Ser: 4.25 mg/dL — ABNORMAL HIGH (ref 0.61–1.24)
GFR calc Af Amer: 15 mL/min — ABNORMAL LOW (ref >60)
GFR calc non Af Amer: 13 mL/min — ABNORMAL LOW (ref >60)
Glucose, Bld: 145 mg/dL — ABNORMAL HIGH (ref 70–99)
Potassium: 3 mmol/L — ABNORMAL LOW (ref 3.5–5.1)
Sodium: 131 mmol/L — ABNORMAL LOW (ref 135–145)

## 2020-04-26 LAB — SARS CORONAVIRUS 2 BY RT PCR (HOSPITAL ORDER, PERFORMED IN ~~LOC~~ HOSPITAL LAB): SARS Coronavirus 2: NEGATIVE

## 2020-04-26 LAB — TROPONIN I (HIGH SENSITIVITY)
Troponin I (High Sensitivity): 12 ng/L (ref <18)
Troponin I (High Sensitivity): 13 ng/L (ref <18)

## 2020-04-26 LAB — SODIUM, URINE, RANDOM: Sodium, Ur: 11 mmol/L

## 2020-04-26 LAB — CREATININE, URINE, RANDOM: Creatinine, Urine: 105.71 mg/dL

## 2020-04-26 MED ORDER — SODIUM CHLORIDE 0.9 % IV BOLUS (SEPSIS)
1000.0000 mL | Freq: Once | INTRAVENOUS | Status: AC
  Administered 2020-04-26: 1000 mL via INTRAVENOUS

## 2020-04-26 MED ORDER — HYDROCODONE-ACETAMINOPHEN 10-325 MG PO TABS
1.0000 | ORAL_TABLET | Freq: Four times a day (QID) | ORAL | Status: DC | PRN
  Administered 2020-04-26 – 2020-04-28 (×4): 1 via ORAL
  Filled 2020-04-26 (×4): qty 1

## 2020-04-26 MED ORDER — METHYLPREDNISOLONE SODIUM SUCC 125 MG IJ SOLR
60.0000 mg | Freq: Three times a day (TID) | INTRAMUSCULAR | Status: DC
  Administered 2020-04-26 – 2020-04-27 (×3): 60 mg via INTRAVENOUS
  Filled 2020-04-26 (×3): qty 2

## 2020-04-26 MED ORDER — VITAMIN D 25 MCG (1000 UNIT) PO TABS
1000.0000 [IU] | ORAL_TABLET | Freq: Every day | ORAL | Status: DC
  Administered 2020-04-26 – 2020-04-28 (×3): 1000 [IU] via ORAL
  Filled 2020-04-26 (×3): qty 1

## 2020-04-26 MED ORDER — PANTOPRAZOLE SODIUM 40 MG PO TBEC
40.0000 mg | DELAYED_RELEASE_TABLET | Freq: Two times a day (BID) | ORAL | Status: DC
  Administered 2020-04-26 – 2020-04-28 (×4): 40 mg via ORAL
  Filled 2020-04-26 (×4): qty 1

## 2020-04-26 MED ORDER — IPRATROPIUM-ALBUTEROL 0.5-2.5 (3) MG/3ML IN SOLN
3.0000 mL | RESPIRATORY_TRACT | Status: DC
  Administered 2020-04-26: 3 mL via RESPIRATORY_TRACT
  Filled 2020-04-26: qty 3

## 2020-04-26 MED ORDER — IPRATROPIUM-ALBUTEROL 0.5-2.5 (3) MG/3ML IN SOLN
3.0000 mL | Freq: Four times a day (QID) | RESPIRATORY_TRACT | Status: DC
  Administered 2020-04-27 – 2020-04-28 (×5): 3 mL via RESPIRATORY_TRACT
  Filled 2020-04-26 (×5): qty 3

## 2020-04-26 MED ORDER — SODIUM CHLORIDE 0.9 % IV SOLN
1.0000 g | Freq: Once | INTRAVENOUS | Status: AC
  Administered 2020-04-26: 1 g via INTRAVENOUS
  Filled 2020-04-26: qty 10

## 2020-04-26 MED ORDER — ROSUVASTATIN CALCIUM 20 MG PO TABS
40.0000 mg | ORAL_TABLET | Freq: Every day | ORAL | Status: DC
  Administered 2020-04-26 – 2020-04-28 (×3): 40 mg via ORAL
  Filled 2020-04-26 (×3): qty 2

## 2020-04-26 MED ORDER — TRAZODONE HCL 50 MG PO TABS
100.0000 mg | ORAL_TABLET | Freq: Every day | ORAL | Status: DC
  Administered 2020-04-26 – 2020-04-27 (×2): 100 mg via ORAL
  Filled 2020-04-26 (×2): qty 2

## 2020-04-26 MED ORDER — FLUTICASONE PROPIONATE 50 MCG/ACT NA SUSP: 2.0000 | Freq: Every day | NASAL | Status: DC | PRN

## 2020-04-26 MED ORDER — OMEGA-3-ACID ETHYL ESTERS 1 G PO CAPS
1.0000 g | ORAL_CAPSULE | Freq: Every day | ORAL | Status: DC
  Administered 2020-04-26 – 2020-04-28 (×3): 1 g via ORAL
  Filled 2020-04-26 (×3): qty 1

## 2020-04-26 MED ORDER — POTASSIUM CHLORIDE CRYS ER 20 MEQ PO TBCR
40.0000 meq | EXTENDED_RELEASE_TABLET | Freq: Once | ORAL | Status: AC
  Administered 2020-04-26: 40 meq via ORAL
  Filled 2020-04-26: qty 2

## 2020-04-26 MED ORDER — SODIUM CHLORIDE 0.9 % IV SOLN: INTRAVENOUS | Status: DC

## 2020-04-26 MED ORDER — FE FUMARATE-B12-VIT C-FA-IFC PO CAPS
1.0000 | ORAL_CAPSULE | Freq: Every day | ORAL | Status: DC
  Filled 2020-04-26 (×4): qty 1

## 2020-04-26 MED ORDER — SODIUM CHLORIDE 0.9 % IV SOLN
500.0000 mg | INTRAVENOUS | Status: DC
  Administered 2020-04-27: 500 mg via INTRAVENOUS
  Filled 2020-04-26 (×2): qty 500

## 2020-04-26 MED ORDER — VITAMIN B-12 1000 MCG PO TABS
1000.0000 ug | ORAL_TABLET | Freq: Every day | ORAL | Status: DC
  Administered 2020-04-26 – 2020-04-28 (×3): 1000 ug via ORAL
  Filled 2020-04-26 (×3): qty 1

## 2020-04-26 MED ORDER — SODIUM CHLORIDE 0.9 % IV SOLN
1000.0000 mL | INTRAVENOUS | Status: DC
  Administered 2020-04-26 – 2020-04-27 (×2): 1000 mL via INTRAVENOUS

## 2020-04-26 MED ORDER — SODIUM CHLORIDE 0.9 % IV SOLN
2.0000 g | INTRAVENOUS | Status: DC
  Administered 2020-04-27: 2 g via INTRAVENOUS
  Filled 2020-04-26: qty 20

## 2020-04-26 MED ORDER — POTASSIUM CHLORIDE CRYS ER 20 MEQ PO TBCR
20.0000 meq | EXTENDED_RELEASE_TABLET | Freq: Once | ORAL | Status: AC
  Administered 2020-04-26: 20 meq via ORAL
  Filled 2020-04-26: qty 1

## 2020-04-26 MED ORDER — HEPARIN SODIUM (PORCINE) 5000 UNIT/ML IJ SOLN
5000.0000 [IU] | Freq: Three times a day (TID) | INTRAMUSCULAR | Status: DC
  Administered 2020-04-26 – 2020-04-28 (×5): 5000 [IU] via SUBCUTANEOUS
  Filled 2020-04-26 (×4): qty 1

## 2020-04-26 MED ORDER — AZITHROMYCIN 250 MG PO TABS
500.0000 mg | ORAL_TABLET | Freq: Once | ORAL | Status: AC
  Administered 2020-04-26: 500 mg via ORAL
  Filled 2020-04-26: qty 2

## 2020-04-26 MED ORDER — ALBUTEROL SULFATE (2.5 MG/3ML) 0.083% IN NEBU: 2.5000 mg | INHALATION_SOLUTION | RESPIRATORY_TRACT | Status: DC | PRN

## 2020-04-26 MED ORDER — CHROMAGEN PO CAPS: 1.0000 | ORAL_CAPSULE | Freq: Every day | ORAL | Status: DC

## 2020-04-26 MED ORDER — SODIUM CHLORIDE 0.9 % IV SOLN
1.0000 g | INTRAVENOUS | Status: AC
  Administered 2020-04-26: 1 g via INTRAVENOUS
  Filled 2020-04-26: qty 10

## 2020-04-26 NOTE — ED Triage Notes (Signed)
Pt c/o a cough with pink tinged sputum. Patient denies known covid exposure, and has history of lung CA.

## 2020-04-26 NOTE — ED Provider Notes (Signed)
[facility] EMERGENCY DEPARTMENT Provider Note   CSN: [id number] Arrival date & time: 04/26/20  1204     History Chief Complaint  Patient presents with  . Cough    Maurice Orozco is a 74 y.o. male.  HPI   Pt has been coughing up blood for the last few days.  He is worried that his cancer might be coming back.  He is also having pain in the right chest.  Some fevers and chills.   The sputum is blood streaked but not just blood.  He has been feeling short of breath. No vomiting or diarrhea.  Pt is not eating or drinking well.  Patient has been urinating but he feels like he has to strain  Past Medical History:  Diagnosis Date  . Adenocarcinoma of lung ([place]) 05/06/2011   rt lung/dx 2011/surg only  . Allergy   . Anxiety   . Arthritis    finger  . Back fracture    DUE TO AA IN 1970  . Chronic back pain   . 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 Essential hypertension 05/06/2011  . GERD (gastroesophageal reflux disease)   . High cholesterol   . High coronary artery calcium score Julye 2018   Agaston Score 1043. dense RCA calcification --Non-ischemic Myoview  . History of radiation therapy 07/04/14, 07/06/14, 07/10/14   LLL lung nodule/54 Gy/3 fx- SBRT  . Hypercholesterolemia 05/06/2011  . Pneumonia due to Streptococcus    HX. POST OPERATIVELY  . Stress fx pelvis    DUE TO AA IN 1970    Patient Active Problem List   Diagnosis Date Noted  . Lipoma of neck   . Dermoid cyst of neck 11/17/2018  . PNA (pneumonia) 04/23/2018  . Iron deficiency anemia 03/15/2018  . Lipoma of back 09/25/2017  . Dyslipidemia, goal LDL below 70 03/23/2017  . Coronary artery calcification seen on CAT scan 03/23/2017  . GERD (gastroesophageal reflux disease) 06/21/2014  . Nausea without vomiting 06/21/2014  . Primary cancer of left lower lobe of lung ([place]) 06/18/2014  . Adenocarcinoma of lung ([place]) 05/06/2011  . Essential hypertension 05/06/2011  . COPD  (chronic obstructive pulmonary disease) ([place]) 05/06/2011  . Emphysema 05/06/2011  . Hypercholesterolemia 05/06/2011  . Back fracture     Past Surgical History:  Procedure Laterality Date  . BACK SURGERY  07/04/2017   fusion above previous surgery  . BACK SURGERY  2011   metal plate l-4,L5  . Coroanry Calcium Score  03/2017   1043. Noted especially in RCA. Dense calcification, recommend Myoview over Cor CTA.  Marland Kitchen LIPOMA EXCISION N/A 10/14/2017   Procedure: EXCISION LIPOMA ON BACK;  Surgeon: Virl Cagey, MD;  Location: [facility];  Service: General;  Laterality: N/A;  . LIPOMA EXCISION Left 11/19/2018   Procedure: MINOR EXCISION OF SEBACEOUS CYST NECK;  Surgeon: Virl Cagey, MD;  Location: [facility];  Service: General;  Laterality: Left;  pt knows to arrive at 7:00  . LUNG LOBECTOMY  2012   RT UPPER LOBE  . NM MYOVIEW LTD  04/2017   EF 60%. Hypertensive response to exercise. (6:21 min, 7.7 METs) No EKG changes. LOW RISK       Family History  Problem Relation Age of Onset  . Hypertension Mother   . Kidney disease Father   . Heart attack Brother   . Cancer Maternal Uncle        prostate cancer  . Cancer Maternal Uncle  prostate cancer  . Cancer Maternal Uncle        prostate cancer  . Cancer Maternal Uncle        prostate cancer    Social History   Tobacco Use  . Smoking status: Former Smoker    Types: Cigarettes    Quit date: 09/08/2010    Years since quitting: 9.6  . Smokeless tobacco: Current User    Types: Chew  . Tobacco comment: still chews tobacco, he tells me he hasn't in the past 2 days.   Vaping Use  . Vaping Use: Never used  Substance Use Topics  . Alcohol use: No  . Drug use: No    Home Medications Prior to Admission medications   Medication Sig Start Date End Date Taking? Authorizing Provider  cholecalciferol (VITAMIN D3) 25 MCG (1000 UNIT) tablet Take 1,000 Units by mouth daily.   Yes [provider]  fluticasone (FLONASE) 50  MCG/ACT nasal spray Place 2 sprays into both nostrils daily as needed for allergies.    Yes [provider]  hydrochlorothiazide 25 MG tablet Take 12.5 mg by mouth daily.    Yes [provider]  HYDROcodone-acetaminophen (NORCO) 10-325 MG per tablet Take 1 tablet by mouth every 6 (six) hours as needed for moderate pain.    Yes [provider]  Iron Combinations (CHROMAGEN) capsule Take 1 capsule by mouth daily.   Yes [provider]  meloxicam (MOBIC) 15 MG tablet Take 15 mg by mouth daily.  11/11/19  Yes [provider]  Omega-3 Fatty Acids (FISH OIL) 1000 MG CAPS Take 1,000 mg by mouth daily.   Yes [provider]  pantoprazole (PROTONIX) 40 MG tablet Take 40 mg by mouth 2 (two) times daily. 03/01/20  Yes [provider]  traZODone (DESYREL) 100 MG tablet Take 100 mg by mouth at bedtime.  11/16/19  Yes [provider]  vitamin B-12 (CYANOCOBALAMIN) 1000 MCG tablet Take 1,000 mcg by mouth daily.   Yes [provider]  zinc gluconate 50 MG tablet Take 50 mg by mouth daily.   Yes [provider]  albuterol (PROVENTIL HFA;VENTOLIN HFA) 108 (90 Base) MCG/ACT inhaler Inhale 2 puffs into the lungs every 4 (four) hours as needed for wheezing or shortness of breath. Patient not taking: Reported on 05/30/2019 04/23/18   Martyn Ehrich, NP  cimetidine (TAGAMET) 200 MG tablet Take 400 mg by mouth daily as needed (for heartburn).    [provider]  Menthol-Methyl Salicylate (MUSCLE RUB EX) Apply 1 application topically 4 (four) times daily as needed (for pain).     [provider]  polyethylene glycol (MIRALAX / GLYCOLAX) packet Take 17 g by mouth daily as needed (constipation).     [provider]  rosuvastatin (CRESTOR) 40 MG tablet Take 1 tablet (40 mg total) by mouth daily. 11/01/18 11/28/19  Leonie Man, MD    Allergies    Fentanyl, Gabapentin, and Aspirin  Review of Systems   Review  of Systems  All other systems reviewed and are negative.   Physical Exam Updated Vital Signs BP (!) 143/47 (BP Location: Right Arm)   Pulse 86   Temp 98 F (36.7 C) (Oral)   Resp 16   Ht 1.676 m (5\' 6" )   Wt 66.7 kg   SpO2 96%   BMI 23.73 kg/m   Physical Exam Vitals and nursing note reviewed.  Constitutional:      General: He is not in acute distress.  Appearance: He is well-developed.  HENT:     Head: Normocephalic and atraumatic.     Right Ear: External ear normal.     Left Ear: External ear normal.  Eyes:     General: No scleral icterus.       Right eye: No discharge.        Left eye: No discharge.     Conjunctiva/sclera: Conjunctivae normal.  Neck:     Trachea: No tracheal deviation.  Cardiovascular:     Rate and Rhythm: Normal rate and regular rhythm.  Pulmonary:     Effort: Pulmonary effort is normal. No respiratory distress.     Breath sounds: No stridor. Rales present. No wheezing.     Comments: Right lower lobe Abdominal:     General: Bowel sounds are normal. There is no distension.     Palpations: Abdomen is soft.     Tenderness: There is no abdominal tenderness. There is no guarding or rebound.  Musculoskeletal:        General: No tenderness.     Cervical back: Neck supple.  Skin:    General: Skin is warm and dry.     Findings: No rash.  Neurological:     Mental Status: He is alert.     Cranial Nerves: No cranial nerve deficit (no facial droop, extraocular movements intact, no slurred speech).     Sensory: No sensory deficit.     Motor: No abnormal muscle tone or seizure activity.     Coordination: Coordination normal.     ED Results / Procedures / Treatments   Labs (all labs ordered are listed, but only abnormal results are displayed) Labs Reviewed  BASIC METABOLIC PANEL - Abnormal; Notable for the following components:      Result Value   Sodium 131 (*)    Potassium 3.0 (*)    Chloride 94 (*)    Glucose, Bld 145 (*)    BUN 59 (*)     Creatinine, Ser 4.25 (*)    Calcium 8.2 (*)    GFR calc non Af Amer 13 (*)    GFR calc Af Amer 15 (*)    All other components within normal limits  CBC - Abnormal; Notable for the following components:   WBC 18.2 (*)    RBC 3.30 (*)    Hemoglobin 10.7 (*)    HCT 33.4 (*)    MCV 101.2 (*)    All other components within normal limits  SARS CORONAVIRUS 2 BY RT PCR (HOSPITAL ORDER, [place] [facility])  TROPONIN I (HIGH SENSITIVITY)  TROPONIN I (HIGH SENSITIVITY)    EKG EKG Interpretation  Date/Time:  Thursday April 26 2020 12:34:20 EDT Ventricular Rate:  81 PR Interval:  152 QRS Duration: 82 QT Interval:  376 QTC Calculation: 436 R Axis:   70 Text Interpretation: Normal sinus rhythm Normal ECG No significant change since last tracing Confirmed by Dorie Rank [phone]) on 04/26/2020 5:14:10 PM   Radiology DG Chest 2 View  Result Date: 04/26/2020 CLINICAL DATA:  Hemoptysis and right-sided chest pain. EXAM: CHEST - 2 VIEW COMPARISON:  June 19, 2017 FINDINGS: The lungs are hyperinflated. Moderate severity infiltrate is seen within the right lung base. Mild, stable linear scarring and/or atelectasis is seen within the mid to lower left lung. There is no evidence of a pleural effusion or pneumothorax. The heart size and mediastinal contours are within normal limits. There is a small, stable hiatal hernia. The visualized skeletal structures are  unremarkable. IMPRESSION: 1. Moderate severity right basilar infiltrate. 2. Stable mild, stable linear scarring and/or atelectasis within the mid to lower left lung. 3. Small, stable hiatal hernia. Electronically Signed   By: Virgina Norfolk M.D.   On: 04/26/2020 15:41    Procedures .Critical Care Performed by: Dorie Rank, MD Authorized by: Dorie Rank, MD   Critical care provider statement:    Critical care time (minutes):  30   Critical care was time spent personally by me on the following activities:  Discussions with  consultants, evaluation of patient's response to treatment, examination of patient, ordering and performing treatments and interventions, ordering and review of laboratory studies, ordering and review of radiographic studies, pulse oximetry, re-evaluation of patient's condition, obtaining history from patient or surrogate and review of old charts   (including critical care time)  Medications Ordered in ED Medications  cefTRIAXone (ROCEPHIN) 1 g in sodium chloride 0.9 % 100 mL IVPB (has no administration in time range)  azithromycin (ZITHROMAX) tablet 500 mg (has no administration in time range)  sodium chloride 0.9 % bolus 1,000 mL (has no administration in time range)    Followed by  0.9 %  sodium chloride infusion (has no administration in time range)  potassium chloride SA (KLOR-CON) CR tablet 20 mEq (has no administration in time range)    ED Course  I have reviewed the triage vital signs and the nursing notes.  Pertinent labs & imaging results that were available during my care of the patient were reviewed by me and considered in my medical decision making (see chart for details).  Clinical Course as of Apr 26 1749  Thu Apr 26, 2020  1749 Chest x-ray shows a right lower lobe pneumonia.  Laboratory tests are notable for leukocytosis   [JK]  1749 Patient has an elevated white blood cell count and a drop in hemoglobin.  Laboratory tests do show new onset acute kidney injury with an elevated creatinine of 4.25.  This is increased compared to previous values.   [JK]    Clinical Course User Index [JK] Dorie Rank, MD   MDM Rules/Calculators/A&P                         Patient presented to the ED for evaluation of right-sided chest pain associated with cough and hemoptysis.  Patient was concerned that he might have recurrent lung cancer.  Chest x-ray is suggestive of of right lower lobe pneumonia.  Patient does have leukocytosis.  Patient denies having any fevers and is not feeling short  of breath however he also has evidence of acute kidney injury with a significant increase in his creatinine.  No signs of sepsis.  However, with the patient's acute kidney injury, his age and the right lower lobe pneumonia I think he warrants admission for IV antibiotics.  Patient's Covid test is negative.  We will add on a bladder scan to assess for urinary retention that could be contributing to his acute kidney injury.   Final Clinical Impression(s) / ED Diagnoses Final diagnoses:  Pneumonia of right lower lobe due to infectious organism  AKI (acute kidney injury) ([place])  Anemia, unspecified type  Hypokalemia      Dorie Rank, MD 04/26/20 1753

## 2020-04-26 NOTE — H&P (Signed)
TRH H&P   Patient Demographics:    Maurice Orozco, is a 74 y.o. male  MRN: [id number]   DOB - [birth year]  Admit Date - 04/26/2020  Outpatient Primary MD for the patient is Redmond School, MD  Referring MD/NP/PA: Dr Tomi Bamberger  Patient coming from: Home  Chief Complaint  Patient presents with  . Cough      HPI:    Maurice Orozco  is a 74 y.o. male, past medical history of COPD, hypertension, GERD, CAD, history of lung cancer status post resection and radiation, in remission, patient presents to ED secondary to complaint of generalized weakness, fatigue, poor appetite and cough, reports is a productive with yellow phlegm, with streaked blood in it, which got him concerned about recurrent cancer which prompted him to come to ED, as well he does endorse some wheezing despite using a nebulizer treatment, no vomiting, diarrhea, fever or chills, no leg edema, patient reports he is vaccinated for Covid, he does report some urine hesitancy. -In ED work-up significant for right lung pneumonia on chest x-ray, leuckcytosis of 18.2, he has hypokalemia with potassium of 3, hyponatremia of 131, [place] elevated at 4.2 from baseline of 1.3, bladder scan showing no retention, post residual volume of 141, patient started on IV Rocephin and treated hospitalist consulted to admit.    Review of systems:    In addition to the HPI above,  No Fever-chills, does report generalized weakness, and fatigue No Headache, No changes with Vision or hearing, No problems swallowing food or Liquids, No Chest pain, as of cough and mild shortness of breath No Abdominal pain, No Nausea or Vommitting, Bowel movements are regular, No Blood in stool or Urine, No dysuria, No new skin rashes or bruises, No new joints pains-aches,  No new weakness, tingling, numbness in any extremity, No recent weight gain or loss, No polyuria,  polydypsia or polyphagia, No significant Mental Stressors.  A full 10 point Review of Systems was done, except as stated above, all other Review of Systems were negative.   With Past History of the following :    Past Medical History:  Diagnosis Date  . 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 Allergy   . Anxiety   . Arthritis    finger  . Back fracture    DUE TO AA IN 1970  . Chronic back pain   . COPD (chronic obstructive pulmonary disease) with emphysema ([facility] [facility]) 05/06/2011  . Essential hypertension 05/06/2011  . GERD (gastroesophageal reflux disease)   . High cholesterol   . High coronary artery calcium score Julye 2018   Agaston Score 1043. dense RCA calcification --Non-ischemic Myoview  . History of radiation therapy 07/04/14, 07/06/14, 07/10/14   LLL lung nodule/54 Gy/3 fx- SBRT  . Hypercholesterolemia 05/06/2011  . Pneumonia due to Streptococcus    HX. POST OPERATIVELY  . Stress fx  pelvis    DUE TO AA IN 1970      Past Surgical History:  Procedure Laterality Date  . BACK SURGERY  07/04/2017   fusion above previous surgery  . BACK SURGERY  2011   metal plate l-4,L5  . Coroanry Calcium Score  03/2017   1043. Noted especially in RCA. Dense calcification, recommend Myoview over Cor CTA.  Marland Kitchen LIPOMA EXCISION N/A 10/14/2017   Procedure: EXCISION LIPOMA ON BACK;  Surgeon: Virl Cagey, MD;  Location: [facility];  Service: General;  Laterality: N/A;  . LIPOMA EXCISION Left 11/19/2018   Procedure: MINOR EXCISION OF SEBACEOUS CYST NECK;  Surgeon: Virl Cagey, MD;  Location: [facility];  Service: General;  Laterality: Left;  pt knows to arrive at 7:00  . LUNG LOBECTOMY  2012   RT UPPER LOBE  . NM MYOVIEW LTD  04/2017   EF 60%. Hypertensive response to exercise. (6:21 min, 7.7 METs) No EKG changes. LOW RISK      Social History:     Social History   Tobacco Use  . Smoking status: Former Smoker    Types: Cigarettes    Quit date:  09/08/2010    Years since quitting: 9.6  . Smokeless tobacco: Current User    Types: Chew  . Tobacco comment: still chews tobacco, he tells me he hasn't in the past 2 days.   Substance Use Topics  . Alcohol use: No      Family History :     Family History  Problem Relation Age of Onset  . Hypertension Mother   . Kidney disease Father   . Heart attack Brother   . Cancer Maternal Uncle        prostate cancer  . Cancer Maternal Uncle        prostate cancer  . Cancer Maternal Uncle        prostate cancer  . Cancer Maternal Uncle        prostate cancer      Home Medications:   Prior to Admission medications   Medication Sig Start Date End Date Taking? Authorizing Provider  cholecalciferol (VITAMIN D3) 25 MCG (1000 UNIT) tablet Take 1,000 Units by mouth daily.   Yes [provider]  fluticasone (FLONASE) 50 MCG/ACT nasal spray Place 2 sprays into both nostrils daily as needed for allergies.    Yes [provider]  hydrochlorothiazide 25 MG tablet Take 12.5 mg by mouth daily.    Yes [provider]  HYDROcodone-acetaminophen (NORCO) 10-325 MG per tablet Take 1 tablet by mouth every 6 (six) hours as needed for moderate pain.    Yes [provider]  Iron Combinations (CHROMAGEN) capsule Take 1 capsule by mouth daily.   Yes [provider]  meloxicam (MOBIC) 15 MG tablet Take 15 mg by mouth daily.  11/11/19  Yes [provider]  Omega-3 Fatty Acids (FISH OIL) 1000 MG CAPS Take 1,000 mg by mouth daily.   Yes [provider]  pantoprazole (PROTONIX) 40 MG tablet Take 40 mg by mouth 2 (two) times daily. 03/01/20  Yes [provider]  traZODone (DESYREL) 100 MG tablet Take 100 mg by mouth at bedtime.  11/16/19  Yes [provider]  vitamin B-12 (CYANOCOBALAMIN) 1000 MCG tablet Take 1,000 mcg by mouth daily.   Yes [provider]  zinc gluconate 50 MG tablet Take 50 mg by mouth daily.   Yes [provider]  albuterol (PROVENTIL HFA;VENTOLIN HFA) 108 (90 Base)  MCG/ACT inhaler Inhale 2 puffs into the lungs every 4 (four) hours as needed for wheezing or shortness of breath. Patient not taking: Reported on 05/30/2019 04/23/18   Martyn Ehrich, NP  cimetidine (TAGAMET) 200 MG tablet Take 400 mg by mouth daily as needed (for heartburn).    [provider]  Menthol-Methyl Salicylate (MUSCLE RUB EX) Apply 1 application topically 4 (four) times daily as needed (for pain).     [provider]  polyethylene glycol (MIRALAX / GLYCOLAX) packet Take 17 g by mouth daily as needed (constipation).     [provider]  rosuvastatin (CRESTOR) 40 MG tablet Take 1 tablet (40 mg total) by mouth daily. 11/01/18 11/28/19  Leonie Man, MD     Allergies:     Allergies  Allergen Reactions  . Fentanyl Other (See Comments)    CAUSED HALLUCINATIONS/05-03-13 pt reports it was oral fentanyl that caused hallucinations CAUSED HALLUCINATIONS/05-03-13 pt reports it was oral fentanyl that caused hallucinations  . Gabapentin Other (See Comments)    Bones throbbed, headaches, weakness  . Aspirin Nausea Only     Physical Exam:   Vitals  Blood pressure (!) 143/47, pulse 86, temperature 98 F (36.7 C), temperature source Oral, resp. rate 16, height 5\' 6"  (1.676 m), weight 66.7 kg, SpO2 96 %.   1. General developed male, laying in bed, no apparent distress  2. Normal affect and insight, Not Suicidal or Homicidal, Awake Alert, Oriented X 3.  3. No F.N deficits, ALL C.Nerves Intact, Strength 5/5 all 4 extremities, Sensation intact all 4 extremities, Plantars down going.  4. Ears and Eyes appear Normal, Conjunctivae clear, PERRLA. Moist Oral Mucosa.  5. Supple Neck, No JVD, No cervical lymphadenopathy appriciated, No Carotid Bruits.  6. Symmetrical Chest wall movement, fair air entry bilaterally, he has scattered wheezing bilaterally.  7. RRR, No Gallops, Rubs or Murmurs, No  Parasternal Heave.  8. Positive Bowel Sounds, Abdomen Soft, No tenderness, No organomegaly appriciated,No rebound -guarding or rigidity.  9.  No Cyanosis, Normal Skin Turgor, No Skin Rash or Bruise.  10. Good muscle tone,  joints appear normal , no effusions, Normal ROM.  11. No Palpable Lymph Nodes in Neck or Axillae     Data Review:    CBC Recent Labs  Lab 04/26/20 1306  WBC 18.2*  HGB 10.7*  HCT 33.4*  PLT 260  MCV 101.2*  MCH 32.4  MCHC 32.0  RDW 12.7   ------------------------------------------------------------------------------------------------------------------  Chemistries  Recent Labs  Lab 04/26/20 1306  NA 131*  K 3.0*  CL 94*  CO2 23  GLUCOSE 145*  BUN 59*  CREATININE 4.25*  CALCIUM 8.2*   ------------------------------------------------------------------------------------------------------------------ estimated creatinine clearance is 14 mL/min (A) (by C-G formula based on SCr of 4.25 mg/dL (H)). ------------------------------------------------------------------------------------------------------------------ No results for input(s): TSH, T4TOTAL, T3FREE, THYROIDAB in the last 72 hours.  Invalid input(s): FREET3  Coagulation profile No results for input(s): INR, PROTIME in the last 168 hours. ------------------------------------------------------------------------------------------------------------------- No results for input(s): DDIMER in the last 72 hours. -------------------------------------------------------------------------------------------------------------------  Cardiac Enzymes No results for input(s): CKMB, TROPONINI, MYOGLOBIN in the last 168 hours.  Invalid input(s): CK ------------------------------------------------------------------------------------------------------------------ No results found for:  BNP   ---------------------------------------------------------------------------------------------------------------  Urinalysis    Component Value Date/Time   COLORURINE YELLOW 10/15/2010 [place] 10/15/2010 0646   LABSPEC 1.023 10/15/2010 0646   PHURINE 5.5 10/15/2010 [place] NEGATIVE 10/15/2010 [place] NEGATIVE 10/15/2010 [place] 10/15/2010 4818  PROTEINUR NEGATIVE 10/15/2010 0646   UROBILINOGEN 0.2 10/15/2010 0646   NITRITE NEGATIVE 10/15/2010 0646   LEUKOCYTESUR  10/15/2010 0646    NEGATIVE MICROSCOPIC NOT DONE ON URINES WITH NEGATIVE PROTEIN, BLOOD, LEUKOCYTES, NITRITE, OR GLUCOSE <1000 mg/dL.    ----------------------------------------------------------------------------------------------------------------   Imaging Results:    DG Chest 2 View  Result Date: 04/26/2020 CLINICAL DATA:  Hemoptysis and right-sided chest pain. EXAM: CHEST - 2 VIEW COMPARISON:  June 19, 2017 FINDINGS: The lungs are hyperinflated. Moderate severity infiltrate is seen within the right lung base. Mild, stable linear scarring and/or atelectasis is seen within the mid to lower left lung. There is no evidence of a pleural effusion or pneumothorax. The heart size and mediastinal contours are within normal limits. There is a small, stable hiatal hernia. The visualized skeletal structures are unremarkable. IMPRESSION: 1. Moderate severity right basilar infiltrate. 2. Stable mild, stable linear scarring and/or atelectasis within the mid to lower left lung. 3. Small, stable hiatal hernia. Electronically Signed   By: Virgina Norfolk M.D.   On: 04/26/2020 15:41      Assessment & Plan:    Active Problems:   Adenocarcinoma of lung (HCC)   Essential hypertension   COPD (chronic obstructive pulmonary disease) (HCC)   Hypercholesterolemia   GERD (gastroesophageal reflux disease)   Coronary artery calcification seen on CAT scan   PNA (pneumonia)   CAP  (community acquired pneumonia)   Community-acquired pneumonia -Patient present with shortness of breath, cough chest x-ray with evidence of pneumonia, he is admitted under pneumonia pathway, continue with IV Rocephin, azithromycin, he is encouraged use incentive spirometry, flutter valve, will check Legionella antigen, strep pneumonia antigen, will check sputum culture as well.  COPD exacerbation -He is with significant wheezing, continue with IV steroids, scheduled duo nebs, home medications and antibiotics  History of lung cancer -Recent CT in February 2020 showing no recurrence, recommend repeating CT chest in 2 to 3 days after treatment with antibiotics.  Hypokalemia -Repleted  Hyponatremia -Volume depletion, hold hydrochlorothiazide and continue with IV normal saline  AKI -Creatinine significantly elevated at 4.25 on admission, no evidence of retention bladder scan showing 141, will obtain renal ultrasound, will calculate FENa, check urinalysis, meanwhile continue with IV fluids, will hold Mobic and hydrochlorothiazide. -Continue with IV fluids.  Hypertension -Blood pressure is low, hold hydrochlorothiazide,  GERD -Continue with Protonix  Hyperlipidemia -Continue with statin  AM Labs Ordered, also please review Full Orders  Family Communication: Admission, patients condition and plan of care including tests being ordered have been discussed with the patient  who indicate understanding and agree with the plan and Code Status  Code Status Full   Likely DC to Home  Condition GUARDED    Consults called:   none  Admission status: inpatient  Time spent in minutes : 60 minutes   Phillips Climes M.D on 04/26/2020 at 6:28 PM   [facility] - Office  [phone]

## 2020-04-27 ENCOUNTER — Inpatient Hospital Stay (HOSPITAL_COMMUNITY): Payer: Medicare Other

## 2020-04-27 DIAGNOSIS — I10 Essential (primary) hypertension: Secondary | ICD-10-CM

## 2020-04-27 DIAGNOSIS — K219 Gastro-esophageal reflux disease without esophagitis: Secondary | ICD-10-CM

## 2020-04-27 DIAGNOSIS — E876 Hypokalemia: Secondary | ICD-10-CM

## 2020-04-27 LAB — BASIC METABOLIC PANEL
Anion gap: 13 (ref 5–15)
BUN: 57 mg/dL — ABNORMAL HIGH (ref 8–23)
CO2: 21 mmol/L — ABNORMAL LOW (ref 22–32)
Calcium: 8.4 mg/dL — ABNORMAL LOW (ref 8.9–10.3)
Chloride: 100 mmol/L (ref 98–111)
Creatinine, Ser: 3.41 mg/dL — ABNORMAL HIGH (ref 0.61–1.24)
GFR calc Af Amer: 20 mL/min — ABNORMAL LOW (ref >60)
GFR calc non Af Amer: 17 mL/min — ABNORMAL LOW (ref >60)
Glucose, Bld: 172 mg/dL — ABNORMAL HIGH (ref 70–99)
Potassium: 3.4 mmol/L — ABNORMAL LOW (ref 3.5–5.1)
Sodium: 134 mmol/L — ABNORMAL LOW (ref 135–145)

## 2020-04-27 LAB — CBC
HCT: 34.5 % — ABNORMAL LOW (ref 39.0–52.0)
Hemoglobin: 11.3 g/dL — ABNORMAL LOW (ref 13.0–17.0)
MCH: 32.8 pg (ref 26.0–34.0)
MCHC: 32.8 g/dL (ref 30.0–36.0)
MCV: 100.3 fL — ABNORMAL HIGH (ref 80.0–100.0)
Platelets: 290 10*3/uL (ref 150–400)
RBC: 3.44 MIL/uL — ABNORMAL LOW (ref 4.22–5.81)
RDW: 12.8 % (ref 11.5–15.5)
WBC: 16.3 10*3/uL — ABNORMAL HIGH (ref 4.0–10.5)
nRBC: 0 % (ref 0.0–0.2)

## 2020-04-27 LAB — HIV ANTIBODY (ROUTINE TESTING W REFLEX): HIV Screen 4th Generation wRfx: NONREACTIVE

## 2020-04-27 LAB — STREP PNEUMONIAE URINARY ANTIGEN: Strep Pneumo Urinary Antigen: NEGATIVE

## 2020-04-27 MED ORDER — PREDNISONE 20 MG PO TABS
40.0000 mg | ORAL_TABLET | Freq: Every day | ORAL | Status: DC
  Administered 2020-04-28: 40 mg via ORAL
  Filled 2020-04-27: qty 2

## 2020-04-27 MED ORDER — POTASSIUM CHLORIDE CRYS ER 20 MEQ PO TBCR
40.0000 meq | EXTENDED_RELEASE_TABLET | Freq: Once | ORAL | Status: AC
  Administered 2020-04-27: 40 meq via ORAL
  Filled 2020-04-27: qty 2

## 2020-04-27 MED ORDER — B COMPLEX-C PO TABS
1.0000 | ORAL_TABLET | Freq: Every day | ORAL | Status: DC
  Administered 2020-04-27 – 2020-04-28 (×2): 1 via ORAL
  Filled 2020-04-27 (×2): qty 1

## 2020-04-27 NOTE — Care Management Important Message (Signed)
Important Message  Patient Details  Name: Maurice Orozco MRN: [id number] Date of Birth: [birth year]   Medicare Important Message Given:  Yes     Tommy Medal 04/27/2020, 3:46 PM

## 2020-04-27 NOTE — Progress Notes (Signed)
PROGRESS NOTE    Maurice Orozco  JSE:[id number] DOB: November 01, 1945 DOA: 04/26/2020 PCP: Redmond School, MD    Brief Narrative:  Maurice Orozco  is a 74 y.o. male, past medical history of COPD, hypertension, GERD, CAD, history of lung cancer status post resection and radiation, in remission, patient presents to ED secondary to complaint of generalized weakness, fatigue, poor appetite and cough, reports is a productive with yellow phlegm, with streaked blood in it, which got him concerned about recurrent cancer which prompted him to come to ED, as well he does endorse some wheezing despite using a nebulizer treatment, no vomiting, diarrhea, fever or chills, no leg edema, patient reports he is vaccinated for Covid, he does report some urine hesitancy. -In ED work-up significant for right lung pneumonia on chest x-ray, leuckcytosis of 18.2, he has hypokalemia with potassium of 3, hyponatremia of 131, [place] elevated at 4.2 from baseline of 1.3, bladder scan showing no retention, post residual volume of 141, patient started on IV Rocephin and treated hospitalist consulted to admit.   Assessment & Plan:   Active Problems:   Adenocarcinoma of lung (HCC)   Essential hypertension   COPD (chronic obstructive pulmonary disease) (HCC)   Hypercholesterolemia   GERD (gastroesophageal reflux disease)   Coronary artery calcification seen on CAT scan   PNA (pneumonia)   CAP (community acquired pneumonia)   1. Community acquired pneumonia.  Currently on Rocephin and azithromycin.  Overall respiratory status appears to be improving.  Continues to have productive cough.  Continue current treatments. 2. COPD exacerbation.  Overall wheezing has improved.  Transition IV steroids to prednisone taper.  Continue bronchodilators. 3. Acute kidney injury.  Creatinine 4.25 on admission.  With IV fluids, creatinine has improved to 3.4 today.  Continue current treatments. 4. Hypertension.  Continue to hold hydrochlorothiazide  in the setting of renal failure.  Blood pressure currently stable. 5. GERD.  Continue Protonix 6. Hyperlipidemia.  Continue statin. 7. Hyponatremia.  Secondary to thiazide and volume depletion.  Continue on saline.  Improving. 8. History of lung cancer.  Would recommend checking CT chest in the next 2 to 3 weeks to ensure resolution of pneumonia   DVT prophylaxis: heparin injection 5,000 Units Start: 04/26/20 2200 SCDs Start: 04/26/20 2033  Code Status: Full code Family Communication: Discussed with patient Disposition Plan: Status is: Inpatient  Remains inpatient appropriate because:IV treatments appropriate due to intensity of illness or inability to take PO   Dispo: The patient is from: Home              Anticipated d/c is to: Home              Anticipated d/c date is: 1 day              Patient currently is not medically stable to d/c.  Patient needs continued IV fluids and further recovery of renal function prior to discharge home.         Consultants:     Procedures:     Antimicrobials:   Ceftriaxone  Azithromycin   Subjective: Feeling better.  He does have productive cough.  Overall shortness of breath is better.  He is making more urine.  Objective: Vitals:   04/27/20 0721 04/27/20 1346 04/27/20 1922 04/27/20 1940  BP:    121/70  Pulse:    85  Resp:    16  Temp:    98.4 F (36.9 C)  TempSrc:    Oral  SpO2: 98% 98% 98%  93%  Weight:      Height:        Intake/Output Summary (Last 24 hours) at 04/27/2020 1945 Last data filed at 04/27/2020 1730 Gross per 24 hour  Intake 1614.4 ml  Output --  Net 1614.4 ml   Filed Weights   04/26/20 1232 04/26/20 2039  Weight: 66.7 kg 68.8 kg    Examination:  General exam: Appears calm and comfortable  Respiratory system: Coarse breath sounds at bases. Respiratory effort normal. Cardiovascular system: S1 & S2 heard, RRR. No JVD, murmurs, rubs, gallops or clicks. No pedal edema. Gastrointestinal system:  Abdomen is nondistended, soft and nontender. No organomegaly or masses felt. Normal bowel sounds heard. Central nervous system: Alert and oriented. No focal neurological deficits. Extremities: Symmetric 5 x 5 power. Skin: No rashes, lesions or ulcers Psychiatry: Judgement and insight appear normal. Mood & affect appropriate.     Data Reviewed: I have personally reviewed following labs and imaging studies  CBC: Recent Labs  Lab 04/26/20 1306 04/27/20 0803  WBC 18.2* 16.3*  HGB 10.7* 11.3*  HCT 33.4* 34.5*  MCV 101.2* 100.3*  PLT 260 643   Basic Metabolic Panel: Recent Labs  Lab 04/26/20 1306 04/27/20 0803  NA 131* 134*  K 3.0* 3.4*  CL 94* 100  CO2 23 21*  GLUCOSE 145* 172*  BUN 59* 57*  CREATININE 4.25* 3.41*  CALCIUM 8.2* 8.4*   GFR: Estimated Creatinine Clearance: 17.4 mL/min (A) (by C-G formula based on SCr of 3.41 mg/dL (H)). Liver Function Tests: No results for input(s): AST, ALT, ALKPHOS, BILITOT, PROT, ALBUMIN in the last 168 hours. No results for input(s): LIPASE, AMYLASE in the last 168 hours. No results for input(s): AMMONIA in the last 168 hours. Coagulation Profile: No results for input(s): INR, PROTIME in the last 168 hours. Cardiac Enzymes: No results for input(s): CKTOTAL, CKMB, CKMBINDEX, TROPONINI in the last 168 hours. BNP (last 3 results) No results for input(s): PROBNP in the last 8760 hours. HbA1C: No results for input(s): HGBA1C in the last 72 hours. CBG: No results for input(s): GLUCAP in the last 168 hours. Lipid Profile: No results for input(s): CHOL, HDL, LDLCALC, TRIG, CHOLHDL, LDLDIRECT in the last 72 hours. Thyroid Function Tests: No results for input(s): TSH, T4TOTAL, FREET4, T3FREE, THYROIDAB in the last 72 hours. Anemia Panel: No results for input(s): VITAMINB12, FOLATE, FERRITIN, TIBC, IRON, RETICCTPCT in the last 72 hours. Sepsis Labs: No results for input(s): PROCALCITON, LATICACIDVEN in the last 168 hours.  Recent Results  (from the past 240 hour(s))  SARS Coronavirus 2 by RT PCR (hospital order, performed in [facility] hospital lab) Nasopharyngeal Nasopharyngeal Swab     Status: None   Collection Time: 04/26/20 12:36 PM   Specimen: Nasopharyngeal Swab  Result Value Ref Range Status   SARS Coronavirus 2 NEGATIVE NEGATIVE Final    Comment: (NOTE) SARS-CoV-2 target nucleic acids are NOT DETECTED.  The SARS-CoV-2 RNA is generally detectable in upper and lower respiratory specimens during the acute phase of infection. The lowest concentration of SARS-CoV-2 viral copies this assay can detect is 250 copies / mL. A negative result does not preclude SARS-CoV-2 infection and should not be used as the sole basis for treatment or other patient management decisions.  A negative result may occur with improper specimen collection / handling, submission of specimen other than nasopharyngeal swab, presence of viral mutation(s) within the areas targeted by this assay, and inadequate number of viral copies (<250 copies / mL). A negative result must  be combined with clinical observations, patient history, and epidemiological information.  Fact Sheet for Patients:   StrictlyIdeas.no  Fact Sheet for Healthcare Providers: BankingDealers.co.za  This test is not yet approved or  cleared by the [place] FDA and has been authorized for detection and/or diagnosis of SARS-CoV-2 by FDA under an Emergency Use Authorization (EUA).  This EUA will remain in effect (meaning this test can be used) for the duration of the COVID-19 declaration under Section 564(b)(1) of the Act, 21 U.S.C. section 360bbb-3(b)(1), unless the authorization is terminated or revoked sooner.  Performed at [facility], [address] [place], [place] [address]          Radiology Studies: DG Chest 2 View  Result Date: 04/26/2020 CLINICAL DATA:  Hemoptysis and right-sided chest pain. EXAM: CHEST - 2  VIEW COMPARISON:  June 19, 2017 FINDINGS: The lungs are hyperinflated. Moderate severity infiltrate is seen within the right lung base. Mild, stable linear scarring and/or atelectasis is seen within the mid to lower left lung. There is no evidence of a pleural effusion or pneumothorax. The heart size and mediastinal contours are within normal limits. There is a small, stable hiatal hernia. The visualized skeletal structures are unremarkable. IMPRESSION: 1. Moderate severity right basilar infiltrate. 2. Stable mild, stable linear scarring and/or atelectasis within the mid to lower left lung. 3. Small, stable hiatal hernia. Electronically Signed   By: Virgina Norfolk M.D.   On: 04/26/2020 15:41   US RENAL  Result Date: 04/27/2020 CLINICAL DATA:  Acute kidney injury. EXAM: RENAL / URINARY TRACT ULTRASOUND COMPLETE COMPARISON:  None. FINDINGS: Right Kidney: Renal measurements: 10.1 x 4.2 x 5.0 cm. = volume: 115.1 mL. Echogenicity within normal limits. No mass or hydronephrosis visualized. Left Kidney: Renal measurements: 9.9 x 6.6 x 5.3 cm = volume: 182.7 mL. Cyst arising from the lateral cortex of left kidney appears anechoic with increased through transmission measuring 1.6 x 1.5 cm. Bladder: Appears normal for degree of bladder distention. Bilateral ureteral jets not visualized. Other: None. IMPRESSION: 1. No mass or hydronephrosis visualized. 2. Left kidney cyst. Electronically Signed   By: Kerby Moors M.D.   On: 04/27/2020 10:00        Scheduled Meds: . B-complex with vitamin C  1 tablet Oral Daily  . cholecalciferol  1,000 Units Oral Daily  . heparin  5,000 Units Subcutaneous Q8H  . ipratropium-albuterol  3 mL Nebulization Q6H WA  . methylPREDNISolone (SOLU-MEDROL) injection  60 mg Intravenous Q8H  . omega-3 acid ethyl esters  1 g Oral Daily  . pantoprazole  40 mg Oral BID  . rosuvastatin  40 mg Oral Daily  . traZODone  100 mg Oral QHS  . vitamin B-12  1,000 mcg Oral Daily    Continuous Infusions: . sodium chloride Stopped (04/27/20 0315)  . sodium chloride 50 mL/hr at 04/27/20 0500  . azithromycin 500 mg (04/27/20 1058)  . cefTRIAXone (ROCEPHIN)  IV 2 g (04/27/20 1542)     LOS: 1 day    Time spent: 59mins    Kathie Dike, MD [facility]   If 7PM-7AM, please contact night-coverage [email or web address]  04/27/2020, 7:45 PM

## 2020-04-28 DIAGNOSIS — C3490 Malignant neoplasm of unspecified part of unspecified bronchus or lung: Secondary | ICD-10-CM

## 2020-04-28 LAB — BASIC METABOLIC PANEL
Anion gap: 10 (ref 5–15)
BUN: 44 mg/dL — ABNORMAL HIGH (ref 8–23)
CO2: 22 mmol/L (ref 22–32)
Calcium: 8.9 mg/dL (ref 8.9–10.3)
Chloride: 105 mmol/L (ref 98–111)
Creatinine, Ser: 2.12 mg/dL — ABNORMAL HIGH (ref 0.61–1.24)
GFR calc Af Amer: 35 mL/min — ABNORMAL LOW (ref >60)
GFR calc non Af Amer: 30 mL/min — ABNORMAL LOW (ref >60)
Glucose, Bld: 148 mg/dL — ABNORMAL HIGH (ref 70–99)
Potassium: 3.5 mmol/L (ref 3.5–5.1)
Sodium: 137 mmol/L (ref 135–145)

## 2020-04-28 LAB — CBC
HCT: 31.4 % — ABNORMAL LOW (ref 39.0–52.0)
Hemoglobin: 10.3 g/dL — ABNORMAL LOW (ref 13.0–17.0)
MCH: 32.5 pg (ref 26.0–34.0)
MCHC: 32.8 g/dL (ref 30.0–36.0)
MCV: 99.1 fL (ref 80.0–100.0)
Platelets: 303 10*3/uL (ref 150–400)
RBC: 3.17 MIL/uL — ABNORMAL LOW (ref 4.22–5.81)
RDW: 13 % (ref 11.5–15.5)
WBC: 19.5 10*3/uL — ABNORMAL HIGH (ref 4.0–10.5)
nRBC: 0 % (ref 0.0–0.2)

## 2020-04-28 LAB — LEGIONELLA PNEUMOPHILA SEROGP 1 UR AG: L. pneumophila Serogp 1 Ur Ag: NEGATIVE

## 2020-04-28 MED ORDER — HYDROCODONE-ACETAMINOPHEN 10-325 MG PO TABS: 1.0000 | ORAL_TABLET | ORAL | 0 refills | Status: DC | PRN

## 2020-04-28 MED ORDER — PREDNISONE 10 MG PO TABS: ORAL_TABLET | ORAL | 0 refills | Status: DC

## 2020-04-28 MED ORDER — ALBUTEROL SULFATE (2.5 MG/3ML) 0.083% IN NEBU: 2.5000 mg | INHALATION_SOLUTION | RESPIRATORY_TRACT | 2 refills | Status: DC | PRN

## 2020-04-28 MED ORDER — AMOXICILLIN-POT CLAVULANATE 875-125 MG PO TABS: 1.0000 | ORAL_TABLET | Freq: Two times a day (BID) | ORAL | 0 refills | Status: AC

## 2020-04-28 MED ORDER — GUAIFENESIN ER 600 MG PO TB12: 600.0000 mg | ORAL_TABLET | Freq: Two times a day (BID) | ORAL | 2 refills | Status: AC

## 2020-04-28 NOTE — Discharge Summary (Signed)
Physician Discharge Summary  Maurice Orozco JKD:[id number] DOB: 08-04-1946 DOA: 04/26/2020  PCP: Redmond School, MD  Admit date: 04/26/2020 Discharge date: 04/28/2020  Admitted From: Home Disposition: Home  Recommendations for Outpatient Follow-up:  1. Follow up with PCP in 1-2 weeks 2. Please obtain BMP/CBC in one week 3. Can resume hydrochlorothiazide if creatinine has normalized and follow-up bmet 4. Consider CT chest in 3 to 4 weeks to ensure resolution of pneumonia  Home Health: Equipment/Devices:  Discharge Condition: Stable CODE STATUS: Full code Diet recommendation: Heart healthy  Brief/Interim Summary: LynnDurhamis a74 y.o.male,past medical history of COPD, hypertension, GERD, CAD, history of lung cancer status post resection and radiation, in remission, patient presents to ED secondary to complaint of generalized weakness, fatigue, poor appetite and cough, reports is a productive with yellow phlegm, with streaked blood in it, which got him concerned about recurrent cancer which prompted him to come to ED, as well he does endorse some wheezing despite using a nebulizer treatment, no vomiting, diarrhea, fever or chills, no leg edema, patient reports he is vaccinated for Covid,he does report some urine hesitancy. -In ED work-up significant for right lung pneumonia on chest x-ray,leuckcytosis of 18.2, he has hypokalemia with potassium of 3, hyponatremia of 131, [place] elevated at 4.2 from baseline of 1.3, bladder scan showing no retention, post residual volume of 141, patient started on IV Rocephin and treated hospitalist consulted to admit.  Discharge Diagnoses:  Active Problems:   Adenocarcinoma of lung (HCC)   Essential hypertension   COPD (chronic obstructive pulmonary disease) (HCC)   Hypercholesterolemia   GERD (gastroesophageal reflux disease)   Coronary artery calcification seen on CAT scan   PNA (pneumonia)   CAP (community acquired pneumonia)  1. Community  acquired pneumonia.  Treated with Rocephin and azithromycin.  Overall respiratory status appears to be improving.  Continues to have productive cough.    He was transitioned to oral Augmentin.  Patient is ambulating without difficulty on room air. 2. COPD exacerbation.  Overall wheezing has improved.  Transition IV steroids to prednisone taper.  Continue bronchodilators. 3. Acute kidney injury.  Creatinine 4.25 on admission.  With IV fluids, creatinine has improved to 2.1 today.    Continue to hold hydrochlorothiazide.  Repeat chemistry in 1 week to ensure resolution of renal function.. 4. Hypertension.  Continue to hold hydrochlorothiazide in the setting of renal failure.  Blood pressure currently stable. 5. GERD.  Continue Protonix 6. Hyperlipidemia.  Continue statin. 7. Hyponatremia.  Secondary to thiazide and volume depletion.    Improved with IV fluids 8. History of lung cancer.  Would recommend checking CT chest in the next 3-4 weeks to ensure resolution of pneumonia  Discharge Instructions  Discharge Instructions    Diet - low sodium heart healthy   Complete by: As directed    For home use only DME Nebulizer machine   Complete by: As directed    Patient needs a nebulizer to treat with the following condition: COPD (chronic obstructive pulmonary disease) ([place])   Length of Need: Lifetime   Increase activity slowly   Complete by: As directed      Allergies as of 04/28/2020      Reactions   Fentanyl Other (See Comments)   CAUSED HALLUCINATIONS/05-03-13 pt reports it was oral fentanyl that caused hallucinations CAUSED HALLUCINATIONS/05-03-13 pt reports it was oral fentanyl that caused hallucinations   Gabapentin Other (See Comments)   Bones throbbed, headaches, weakness   Aspirin Nausea Only      Medication  List    STOP taking these medications   hydrochlorothiazide 25 MG tablet Commonly known as: HYDRODIURIL   meloxicam 15 MG tablet Commonly known as: MOBIC     TAKE these  medications   albuterol 108 (90 Base) MCG/ACT inhaler Commonly known as: VENTOLIN HFA Inhale 2 puffs into the lungs every 4 (four) hours as needed for wheezing or shortness of breath. What changed: Another medication with the same name was added. Make sure you understand how and when to take each.   albuterol (2.5 MG/3ML) 0.083% nebulizer solution Commonly known as: PROVENTIL Take 3 mLs (2.5 mg total) by nebulization every 4 (four) hours as needed for wheezing or shortness of breath. What changed: You were already taking a medication with the same name, and this prescription was added. Make sure you understand how and when to take each.   amoxicillin-clavulanate 875-125 MG tablet Commonly known as: Augmentin Take 1 tablet by mouth 2 (two) times daily for 3 days.   cholecalciferol 25 MCG (1000 UNIT) tablet Commonly known as: VITAMIN D3 Take 1,000 Units by mouth daily.   chromagen capsule Take 1 capsule by mouth daily.   cimetidine 200 MG tablet Commonly known as: TAGAMET Take 400 mg by mouth daily as needed (for heartburn).   Fish Oil 1000 MG Caps Take 1,000 mg by mouth daily.   fluticasone 50 MCG/ACT nasal spray Commonly known as: FLONASE Place 2 sprays into both nostrils daily as needed for allergies.   guaiFENesin 600 MG 12 hr tablet Commonly known as: Mucinex Take 1 tablet (600 mg total) by mouth 2 (two) times daily.   HYDROcodone-acetaminophen 10-325 MG tablet Commonly known as: NORCO Take 1-2 tablets by mouth every 4 (four) hours as needed for moderate pain. What changed:   how much to take  when to take this   MUSCLE RUB EX Apply 1 application topically 4 (four) times daily as needed (for pain).   pantoprazole 40 MG tablet Commonly known as: PROTONIX Take 40 mg by mouth 2 (two) times daily.   polyethylene glycol 17 g packet Commonly known as: MIRALAX / GLYCOLAX Take 17 g by mouth daily as needed (constipation).   predniSONE 10 MG tablet Commonly known  as: DELTASONE Take 40mg  po daily for 2 days then 30mg  daily for 2 days then 20mg  daily for 2 days then 10mg  daily for 2 days then stop   rosuvastatin 40 MG tablet Commonly known as: CRESTOR Take 1 tablet (40 mg total) by mouth daily.   traZODone 100 MG tablet Commonly known as: DESYREL Take 100 mg by mouth at bedtime.   vitamin B-12 1000 MCG tablet Commonly known as: CYANOCOBALAMIN Take 1,000 mcg by mouth daily.   zinc gluconate 50 MG tablet Take 50 mg by mouth daily.            Durable Medical Equipment  (From admission, onward)         Start     Ordered   04/28/20 0000  For home use only DME Nebulizer machine       Question Answer Comment  Patient needs a nebulizer to treat with the following condition COPD (chronic obstructive pulmonary disease) ([place])   Length of Need Lifetime      04/28/20 1035          Follow-up Information    Redmond School, MD Follow up.   Specialty: Internal Medicine Why: repeat bmet in 1 week check CT chest in 3-4 weeks to ensure resolution of pneumonia Contact  information: [place] [address] [phone]        [facility] Follow up.   Why: Nebulizer Contact information: [address] [place] [address] [phone]              Allergies  Allergen Reactions  . Fentanyl Other (See Comments)    CAUSED HALLUCINATIONS/05-03-13 pt reports it was oral fentanyl that caused hallucinations CAUSED HALLUCINATIONS/05-03-13 pt reports it was oral fentanyl that caused hallucinations  . Gabapentin Other (See Comments)    Bones throbbed, headaches, weakness  . Aspirin Nausea Only    Consultations:     Procedures/Studies: DG Chest 2 View  Result Date: 04/26/2020 CLINICAL DATA:  Hemoptysis and right-sided chest pain. EXAM: CHEST - 2 VIEW COMPARISON:  June 19, 2017 FINDINGS: The lungs are hyperinflated. Moderate severity infiltrate is seen within the right lung base.  Mild, stable linear scarring and/or atelectasis is seen within the mid to lower left lung. There is no evidence of a pleural effusion or pneumothorax. The heart size and mediastinal contours are within normal limits. There is a small, stable hiatal hernia. The visualized skeletal structures are unremarkable. IMPRESSION: 1. Moderate severity right basilar infiltrate. 2. Stable mild, stable linear scarring and/or atelectasis within the mid to lower left lung. 3. Small, stable hiatal hernia. Electronically Signed   By: Virgina Norfolk M.D.   On: 04/26/2020 15:41   US RENAL  Result Date: 04/27/2020 CLINICAL DATA:  Acute kidney injury. EXAM: RENAL / URINARY TRACT ULTRASOUND COMPLETE COMPARISON:  None. FINDINGS: Right Kidney: Renal measurements: 10.1 x 4.2 x 5.0 cm. = volume: 115.1 mL. Echogenicity within normal limits. No mass or hydronephrosis visualized. Left Kidney: Renal measurements: 9.9 x 6.6 x 5.3 cm = volume: 182.7 mL. Cyst arising from the lateral cortex of left kidney appears anechoic with increased through transmission measuring 1.6 x 1.5 cm. Bladder: Appears normal for degree of bladder distention. Bilateral ureteral jets not visualized. Other: None. IMPRESSION: 1. No mass or hydronephrosis visualized. 2. Left kidney cyst. Electronically Signed   By: Kerby Moors M.D.   On: 04/27/2020 10:00       Subjective: No shortness of breath.  Still has productive cough.  No wheezing.  Discharge Exam: Vitals:   04/27/20 1940 04/28/20 0206 04/28/20 0554 04/28/20 0749  BP: 121/70  (!) 124/59   Pulse: 85  92   Resp: 16  18   Temp: 98.4 F (36.9 C)  98.7 F (37.1 C)   TempSrc: Oral  Oral   SpO2: 93% 95% 96% 94%  Weight:      Height:        General: Pt is alert, awake, not in acute distress Cardiovascular: RRR, S1/S2 +, no rubs, no gallops Respiratory: CTA bilaterally, no wheezing, no rhonchi Abdominal: Soft, NT, ND, bowel sounds + Extremities: no edema, no cyanosis    The results of  significant diagnostics from this hospitalization (including imaging, microbiology, ancillary and laboratory) are listed below for reference.     Microbiology: Recent Results (from the past 240 hour(s))  SARS Coronavirus 2 by RT PCR (hospital order, performed in [facility] hospital lab) Nasopharyngeal Nasopharyngeal Swab     Status: None   Collection Time: 04/26/20 12:36 PM   Specimen: Nasopharyngeal Swab  Result Value Ref Range Status   SARS Coronavirus 2 NEGATIVE NEGATIVE Final    Comment: (NOTE) SARS-CoV-2 target nucleic acids are NOT DETECTED.  The SARS-CoV-2 RNA is generally detectable in upper and lower respiratory  specimens during the acute phase of infection. The lowest concentration of SARS-CoV-2 viral copies this assay can detect is 250 copies / mL. A negative result does not preclude SARS-CoV-2 infection and should not be used as the sole basis for treatment or other patient management decisions.  A negative result may occur with improper specimen collection / handling, submission of specimen other than nasopharyngeal swab, presence of viral mutation(s) within the areas targeted by this assay, and inadequate number of viral copies (<250 copies / mL). A negative result must be combined with clinical observations, patient history, and epidemiological information.  Fact Sheet for Patients:   StrictlyIdeas.no  Fact Sheet for Healthcare Providers: BankingDealers.co.za  This test is not yet approved or  cleared by the [place] FDA and has been authorized for detection and/or diagnosis of SARS-CoV-2 by FDA under an Emergency Use Authorization (EUA).  This EUA will remain in effect (meaning this test can be used) for the duration of the COVID-19 declaration under Section 564(b)(1) of the Act, 21 U.S.C. section 360bbb-3(b)(1), unless the authorization is terminated or revoked sooner.  Performed at [facility], [address], [place], [place] [address]      Labs: BNP (last 3 results) No results for input(s): BNP in the last 8760 hours. Basic Metabolic Panel: Recent Labs  Lab 04/26/20 1306 04/27/20 0803 04/28/20 0714  NA 131* 134* 137  K 3.0* 3.4* 3.5  CL 94* 100 105  CO2 23 21* 22  GLUCOSE 145* 172* 148*  BUN 59* 57* 44*  CREATININE 4.25* 3.41* 2.12*  CALCIUM 8.2* 8.4* 8.9   Liver Function Tests: No results for input(s): AST, ALT, ALKPHOS, BILITOT, PROT, ALBUMIN in the last 168 hours. No results for input(s): LIPASE, AMYLASE in the last 168 hours. No results for input(s): AMMONIA in the last 168 hours. CBC: Recent Labs  Lab 04/26/20 1306 04/27/20 0803 04/28/20 0714  WBC 18.2* 16.3* 19.5*  HGB 10.7* 11.3* 10.3*  HCT 33.4* 34.5* 31.4*  MCV 101.2* 100.3* 99.1  PLT 260 290 303   Cardiac Enzymes: No results for input(s): CKTOTAL, CKMB, CKMBINDEX, TROPONINI in the last 168 hours. BNP: Invalid input(s): POCBNP CBG: No results for input(s): GLUCAP in the last 168 hours. D-Dimer No results for input(s): DDIMER in the last 72 hours. Hgb A1c No results for input(s): HGBA1C in the last 72 hours. Lipid Profile No results for input(s): CHOL, HDL, LDLCALC, TRIG, CHOLHDL, LDLDIRECT in the last 72 hours. Thyroid function studies No results for input(s): TSH, T4TOTAL, T3FREE, THYROIDAB in the last 72 hours.  Invalid input(s): FREET3 Anemia work up No results for input(s): VITAMINB12, FOLATE, FERRITIN, TIBC, IRON, RETICCTPCT in the last 72 hours. Urinalysis    Component Value Date/Time   COLORURINE AMBER (A) 04/26/2020 1801   APPEARANCEUR CLOUDY (A) 04/26/2020 1801   LABSPEC 1.026 04/26/2020 1801   PHURINE 5.0 04/26/2020 1801   GLUCOSEU NEGATIVE 04/26/2020 1801   HGBUR SMALL (A) 04/26/2020 1801   BILIRUBINUR NEGATIVE 04/26/2020 1801   KETONESUR NEGATIVE 04/26/2020 1801   PROTEINUR NEGATIVE 04/26/2020 1801   UROBILINOGEN 0.2 10/15/2010 0646   NITRITE NEGATIVE 04/26/2020 1801    LEUKOCYTESUR TRACE (A) 04/26/2020 1801   Sepsis Labs Invalid input(s): PROCALCITONIN,  WBC,  LACTICIDVEN Microbiology Recent Results (from the past 240 hour(s))  SARS Coronavirus 2 by RT PCR (hospital order, performed in [place] hospital lab) Nasopharyngeal Nasopharyngeal Swab     Status: None   Collection Time: 04/26/20 12:36 PM   Specimen: Nasopharyngeal Swab  Result Value Ref Range Status   SARS Coronavirus 2 NEGATIVE NEGATIVE Final    Comment: (NOTE) SARS-CoV-2 target nucleic acids are NOT DETECTED.  The SARS-CoV-2 RNA is generally detectable in upper and lower respiratory specimens during the acute phase of infection. The lowest concentration of SARS-CoV-2 viral copies this assay can detect is 250 copies / mL. A negative result does not preclude SARS-CoV-2 infection and should not be used as the sole basis for treatment or other patient management decisions.  A negative result may occur with improper specimen collection / handling, submission of specimen other than nasopharyngeal swab, presence of viral mutation(s) within the areas targeted by this assay, and inadequate number of viral copies (<250 copies / mL). A negative result must be combined with clinical observations, patient history, and epidemiological information.  Fact Sheet for Patients:   StrictlyIdeas.no  Fact Sheet for Healthcare Providers: BankingDealers.co.za  This test is not yet approved or  cleared by the [place] FDA and has been authorized for detection and/or diagnosis of SARS-CoV-2 by FDA under an Emergency Use Authorization (EUA).  This EUA will remain in effect (meaning this test can be used) for the duration of the COVID-19 declaration under Section 564(b)(1) of the Act, 21 U.S.C. section 360bbb-3(b)(1), unless the authorization is terminated or revoked sooner.  Performed at [facility], [address] [place], [place] [address]       Time coordinating discharge: 37mins  SIGNED:   Kathie Dike, MD  [facility] 04/28/2020, 8:04 PM   If 7PM-7AM, please contact night-coverage [email or web address]

## 2020-04-28 NOTE — TOC Transition Note (Signed)
Transition of Care [facility]) - CM/SW Discharge Note   Patient Details  Name: Maurice Orozco MRN: [id number] Date of Birth: [birth year]  Transition of Care [facility]) CM/SW Contact:  Natasha Bence, LCSW Phone Number: 04/28/2020, 11:31 AM   Clinical Narrative:    CSW received order for nebulizer for patient. CSW placed referral for nebulizer with [employer]. [employer] agreeable to provide nebulizer and drop ship it to patient. TOC signing off.    Final next level of care: Home/Self Care Barriers to Discharge: Barriers Resolved   Patient Goals and CMS Choice        Discharge Placement                Patient to be transferred to facility by: N/A Name of family member notified: N/A    Discharge Plan and Services                DME Arranged: Nebulizer machine DME Agency: [employer] Date DME Agency Contacted: 04/28/20 Time DME Agency Contacted: [phone] Representative spoke with at DME Agency: [place] ([place]) Interventions     Readmission Risk Interventions No flowsheet data found.

## 2020-04-28 NOTE — Plan of Care (Signed)

## 2020-04-28 NOTE — Progress Notes (Signed)
Nsg Discharge Note  Admit Date:  04/26/2020 Discharge date: 04/28/2020   Maurice Orozco to be D/C'd home  per MD order.  AVS completed.  Copy for chart, and copy for patient signed, and dated. Patient/caregiver able to verbalize understanding.  Discharge Medication: Allergies as of 04/28/2020      Reactions   Fentanyl Other (See Comments)   CAUSED HALLUCINATIONS/05-03-13 pt reports it was oral fentanyl that caused hallucinations CAUSED HALLUCINATIONS/05-03-13 pt reports it was oral fentanyl that caused hallucinations   Gabapentin Other (See Comments)   Bones throbbed, headaches, weakness   Aspirin Nausea Only      Medication List    STOP taking these medications   hydrochlorothiazide 25 MG tablet Commonly known as: HYDRODIURIL   meloxicam 15 MG tablet Commonly known as: MOBIC     TAKE these medications   albuterol 108 (90 Base) MCG/ACT inhaler Commonly known as: VENTOLIN HFA Inhale 2 puffs into the lungs every 4 (four) hours as needed for wheezing or shortness of breath. What changed: Another medication with the same name was added. Make sure you understand how and when to take each.   albuterol (2.5 MG/3ML) 0.083% nebulizer solution Commonly known as: PROVENTIL Take 3 mLs (2.5 mg total) by nebulization every 4 (four) hours as needed for wheezing or shortness of breath. What changed: You were already taking a medication with the same name, and this prescription was added. Make sure you understand how and when to take each.   amoxicillin-clavulanate 875-125 MG tablet Commonly known as: Augmentin Take 1 tablet by mouth 2 (two) times daily for 3 days.   cholecalciferol 25 MCG (1000 UNIT) tablet Commonly known as: VITAMIN D3 Take 1,000 Units by mouth daily.   chromagen capsule Take 1 capsule by mouth daily.   cimetidine 200 MG tablet Commonly known as: TAGAMET Take 400 mg by mouth daily as needed (for heartburn).   Fish Oil 1000 MG Caps Take 1,000 mg by mouth daily.    fluticasone 50 MCG/ACT nasal spray Commonly known as: FLONASE Place 2 sprays into both nostrils daily as needed for allergies.   guaiFENesin 600 MG 12 hr tablet Commonly known as: Mucinex Take 1 tablet (600 mg total) by mouth 2 (two) times daily.   HYDROcodone-acetaminophen 10-325 MG tablet Commonly known as: NORCO Take 1-2 tablets by mouth every 4 (four) hours as needed for moderate pain. What changed:   how much to take  when to take this   MUSCLE RUB EX Apply 1 application topically 4 (four) times daily as needed (for pain).   pantoprazole 40 MG tablet Commonly known as: PROTONIX Take 40 mg by mouth 2 (two) times daily.   polyethylene glycol 17 g packet Commonly known as: MIRALAX / GLYCOLAX Take 17 g by mouth daily as needed (constipation).   predniSONE 10 MG tablet Commonly known as: DELTASONE Take 40mg  po daily for 2 days then 30mg  daily for 2 days then 20mg  daily for 2 days then 10mg  daily for 2 days then stop   rosuvastatin 40 MG tablet Commonly known as: CRESTOR Take 1 tablet (40 mg total) by mouth daily.   traZODone 100 MG tablet Commonly known as: DESYREL Take 100 mg by mouth at bedtime.   vitamin B-12 1000 MCG tablet Commonly known as: CYANOCOBALAMIN Take 1,000 mcg by mouth daily.   zinc gluconate 50 MG tablet Take 50 mg by mouth daily.            Durable Medical Equipment  (From admission,  onward)         Start     Ordered   04/28/20 0000  For home use only DME Nebulizer machine       Question Answer Comment  Patient needs a nebulizer to treat with the following condition COPD (chronic obstructive pulmonary disease) (Maurice Orozco)   Length of Need Lifetime      04/28/20 1035          Discharge Assessment: Vitals:   04/28/20 0554 04/28/20 0749  BP: (!) 124/59   Pulse: 92   Resp: 18   Temp: 98.7 F (37.1 C)   SpO2: 96% 94%   Skin clean, dry and intact without evidence of skin break down, no evidence of skin tears noted. IV catheter  discontinued intact. Site without signs and symptoms of complications - no redness or edema noted at insertion site, patient denies c/o pain - only slight tenderness at site.  Dressing with slight pressure applied.  D/c Instructions-Education: Discharge instructions given to patient/family with verbalized understanding. D/c education completed with patient/family including follow up instructions, medication list, d/c activities limitations if indicated, with other d/c instructions as indicated by MD - patient able to verbalize understanding, all questions fully answered. Patient instructed to return to ED, call 911, or call MD for any changes in condition.  Patient escorted via [place], and D/C home via private auto.  Venita Sheffield, RN 04/28/2020 11:38 AM

## 2020-05-01 ENCOUNTER — Other Ambulatory Visit: Payer: Self-pay | Admitting: *Deleted

## 2020-05-01 NOTE — Patient Outreach (Signed)
[place] [facility]) Care Management  05/01/2020  [place] 1946-04-28 [id number]   EMMI- GENERAL DISCHARGE RED ON EMMI ALERT Day # 1 Date: 04/30/2020 Red Alert Reason: No follow up schedule and Yes to unfilled prescriptions  OUTREACH #1 RN attempted outreach call however unsuccessful but able to leave a HIPAA approved voice message requesting a call back.  Plan: Will scheduled another outreach next week for pending inquires and to address the above emmi.  Raina Mina, RN Care Management Coordinator [place] Office [phone]

## 2020-05-03 ENCOUNTER — Other Ambulatory Visit: Payer: Self-pay | Admitting: *Deleted

## 2020-05-03 DIAGNOSIS — J189 Pneumonia, unspecified organism: Secondary | ICD-10-CM | POA: Diagnosis not present

## 2020-05-03 DIAGNOSIS — Z6824 Body mass index (BMI) 24.0-24.9, adult: Secondary | ICD-10-CM | POA: Diagnosis not present

## 2020-05-03 NOTE — Patient Outreach (Signed)
[place] [facility]) Care Management  05/03/2020  Maurice Orozco Oct 20, 1945 [id number]   EMMI-GENERAL DISCHARGE RED ON EMMI ALERT Day #1 Date: 04/30/2020 Red Alert Reason: NO FOLLOW UP APPOINTMENT/UNFILLED PRESCRIPTION  OUTREACH#1 RN attempted outreach call however unsuccessful. Able to leave a HIPAA approved voice message requesting a call back.  Plan: Will scheduled another outreach call over the next will.  Raina Mina, RN Care Management Coordinator [place] Office [phone]

## 2020-05-07 ENCOUNTER — Ambulatory Visit: Payer: Self-pay | Admitting: *Deleted

## 2020-05-08 ENCOUNTER — Other Ambulatory Visit (HOSPITAL_COMMUNITY): Payer: Self-pay | Admitting: Physician Assistant

## 2020-05-08 DIAGNOSIS — C3432 Malignant neoplasm of lower lobe, left bronchus or lung: Secondary | ICD-10-CM

## 2020-05-21 DIAGNOSIS — Z6822 Body mass index (BMI) 22.0-22.9, adult: Secondary | ICD-10-CM | POA: Diagnosis not present

## 2020-05-21 DIAGNOSIS — M48061 Spinal stenosis, lumbar region without neurogenic claudication: Secondary | ICD-10-CM | POA: Diagnosis not present

## 2020-05-21 DIAGNOSIS — G894 Chronic pain syndrome: Secondary | ICD-10-CM | POA: Diagnosis not present

## 2020-05-21 DIAGNOSIS — G5702 Lesion of sciatic nerve, left lower limb: Secondary | ICD-10-CM | POA: Diagnosis not present

## 2020-05-29 ENCOUNTER — Other Ambulatory Visit: Payer: Self-pay

## 2020-05-29 ENCOUNTER — Inpatient Hospital Stay (HOSPITAL_COMMUNITY): Payer: Medicare Other | Attending: Hematology

## 2020-05-29 DIAGNOSIS — D508 Other iron deficiency anemias: Secondary | ICD-10-CM

## 2020-05-29 DIAGNOSIS — C3432 Malignant neoplasm of lower lobe, left bronchus or lung: Secondary | ICD-10-CM | POA: Diagnosis not present

## 2020-05-29 DIAGNOSIS — D509 Iron deficiency anemia, unspecified: Secondary | ICD-10-CM | POA: Insufficient documentation

## 2020-05-29 LAB — IRON AND TIBC
Iron: 98 ug/dL (ref 45–182)
Saturation Ratios: 26 % (ref 17.9–39.5)
TIBC: 372 ug/dL (ref 250–450)
UIBC: 274 ug/dL

## 2020-05-29 LAB — VITAMIN B12: Vitamin B-12: 841 pg/mL (ref 180–914)

## 2020-05-29 LAB — COMPREHENSIVE METABOLIC PANEL
ALT: 21 U/L (ref 0–44)
AST: 16 U/L (ref 15–41)
Albumin: 3.8 g/dL (ref 3.5–5.0)
Alkaline Phosphatase: 48 U/L (ref 38–126)
Anion gap: 7 (ref 5–15)
BUN: 16 mg/dL (ref 8–23)
CO2: 29 mmol/L (ref 22–32)
Calcium: 9 mg/dL (ref 8.9–10.3)
Chloride: 98 mmol/L (ref 98–111)
Creatinine, Ser: 1.01 mg/dL (ref 0.61–1.24)
GFR calc Af Amer: >60 mL/min (ref >60)
GFR calc non Af Amer: >60 mL/min (ref >60)
Glucose, Bld: 124 mg/dL — ABNORMAL HIGH (ref 70–99)
Potassium: 4 mmol/L (ref 3.5–5.1)
Sodium: 134 mmol/L — ABNORMAL LOW (ref 135–145)
Total Bilirubin: 0.7 mg/dL (ref 0.3–1.2)
Total Protein: 6.6 g/dL (ref 6.5–8.1)

## 2020-05-29 LAB — CBC WITH DIFFERENTIAL/PLATELET
Abs Immature Granulocytes: 0.08 10*3/uL — ABNORMAL HIGH (ref 0.00–0.07)
Basophils Absolute: 0.1 10*3/uL (ref 0.0–0.1)
Basophils Relative: 1 %
Eosinophils Absolute: 0.2 10*3/uL (ref 0.0–0.5)
Eosinophils Relative: 2 %
HCT: 37.7 % — ABNORMAL LOW (ref 39.0–52.0)
Hemoglobin: 12 g/dL — ABNORMAL LOW (ref 13.0–17.0)
Immature Granulocytes: 1 %
Lymphocytes Relative: 20 %
Lymphs Abs: 1.8 10*3/uL (ref 0.7–4.0)
MCH: 32.6 pg (ref 26.0–34.0)
MCHC: 31.8 g/dL (ref 30.0–36.0)
MCV: 102.4 fL — ABNORMAL HIGH (ref 80.0–100.0)
Monocytes Absolute: 0.7 10*3/uL (ref 0.1–1.0)
Monocytes Relative: 7 %
Neutro Abs: 6.4 10*3/uL (ref 1.7–7.7)
Neutrophils Relative %: 69 %
Platelets: 274 10*3/uL (ref 150–400)
RBC: 3.68 MIL/uL — ABNORMAL LOW (ref 4.22–5.81)
RDW: 13.7 % (ref 11.5–15.5)
WBC: 9.2 10*3/uL (ref 4.0–10.5)
nRBC: 0 % (ref 0.0–0.2)

## 2020-05-29 LAB — FOLATE: Folate: 7.1 ng/mL (ref >5.9)

## 2020-05-29 LAB — FERRITIN: Ferritin: 68 ng/mL (ref 24–336)

## 2020-05-30 ENCOUNTER — Other Ambulatory Visit: Payer: Self-pay

## 2020-05-30 ENCOUNTER — Inpatient Hospital Stay (HOSPITAL_BASED_OUTPATIENT_CLINIC_OR_DEPARTMENT_OTHER): Payer: Medicare Other | Admitting: Nurse Practitioner

## 2020-05-30 DIAGNOSIS — D508 Other iron deficiency anemias: Secondary | ICD-10-CM | POA: Diagnosis not present

## 2020-05-30 NOTE — Progress Notes (Signed)
[facility] Follow up:    Maurice School, Maurice Orozco [address] [place] [address]   DIAGNOSIS: Normocytic anemia   Cancer Staging Adenocarcinoma of lung [facility]) Staging form: Lung, AJCC 7th Edition - Clinical: Stage IA (T1a, N0, M0) - Signed by Baird Cancer, PA on 05/06/2011   SUMMARY OF ONCOLOGIC HISTORY: Oncology History  Adenocarcinoma of lung ([place])  09/30/2010 Cancer Staging   CT of chest showed spiculated lesion   10/15/2010 Surgery   Right upper lobectomy by Dr. Arlyce Dice   10/16/2010 Remission     10/24/2013 Imaging   CT Chest-  Enlarging subpleural nodule in the left lower lobe, now 9 mm, with distortion of the overlying pleura. Finding is concerning for a small bronchogenic carcinoma   01/23/2014 Imaging   CT Chest- Left lower lobe nodule has enlarged from 11/01/2012 and is worrisome for primary bronchogenic carcinoma.    07/04/2014 - 07/10/2014 Radiation Therapy   SBRT by Dr. Isidore Moos in 3 fractions.   07/05/2015 Imaging   perifissural nodule in LLL appears slightly smaller with parenchymal opacity attrib to XRT, likely inflamm pathcy airspace dz more inferiorly in LLL, airspace dz in RLL resolved. no metastatic disease   Primary cancer of left lower lobe of lung ([place])  06/18/2014 Initial Diagnosis   Left lower lobe nodule suspicious for primary lung carcinoma   07/04/2014 - 07/10/2014 Radiation Therapy   SBRT   10/12/2014 Imaging   No acute findings within the chest. Decrease in size of left lower lobe perifissural nodule compatible with response to therapy. No new or progressive disease identified within the chest.     CURRENT THERAPY: Oral iron therapy  INTERVAL HISTORY: Maurice Orozco 74 y.o. male was called for a follow-up for iron deficiency anemia.  Patient reports he is doing well since his last visit.  He was in the hospital for pneumonia but is coping well.  He is taking his oral iron therapy as prescribed with no complications.   He denies any bright red bleeding per rectum or melena.  Denies any easy bruising or bleeding. Denies any nausea, vomiting, or diarrhea. Denies any new pains. Had not noticed any recent bleeding such as epistaxis, hematuria or hematochezia. Denies recent chest pain on exertion, shortness of breath on minimal exertion, pre-syncopal episodes, or palpitations. Denies any numbness or tingling in hands or feet. Denies any recent fevers, infections, or recent hospitalizations. Patient reports appetite at 100% and energy level at 75%.  He is eating well maintain his weight at this time.    Patient Active Problem List   Diagnosis Date Noted  . CAP (community acquired pneumonia) 04/26/2020  . Lipoma of neck   . Dermoid cyst of neck 11/17/2018  . PNA (pneumonia) 04/23/2018  . Iron deficiency anemia 03/15/2018  . Lipoma of back 09/25/2017  . Dyslipidemia, goal LDL below 70 03/23/2017  . Coronary artery calcification seen on CAT scan 03/23/2017  . GERD (gastroesophageal reflux disease) 06/21/2014  . Nausea without vomiting 06/21/2014  . Primary cancer of left lower lobe of lung ([place]) 06/18/2014  . Adenocarcinoma of lung ([place]) 05/06/2011  . Essential hypertension 05/06/2011  . COPD (chronic obstructive pulmonary disease) ([place]) 05/06/2011  . Emphysema 05/06/2011  . Hypercholesterolemia 05/06/2011  . Back fracture     is allergic to fentanyl, gabapentin, and aspirin.  MEDICAL HISTORY: Past Medical History:  Diagnosis Date  . Adenocarcinoma of lung ([employer]) 05/06/2011   rt lung/dx 2011/surg only  . Allergy   .  Anxiety   . Arthritis    finger  . Back fracture    DUE TO AA IN 1970  . Chronic back pain   . COPD (chronic obstructive pulmonary disease) with emphysema ([place]) 05/06/2011  . Essential hypertension 05/06/2011  . GERD (gastroesophageal reflux disease)   . High cholesterol   . High coronary artery calcium score Julye 2018   Agaston Score 1043. dense RCA calcification --Non-ischemic  Myoview  . History of radiation therapy 07/04/14, 07/06/14, 07/10/14   LLL lung nodule/54 Gy/3 fx- SBRT  . Hypercholesterolemia 05/06/2011  . Pneumonia due to Streptococcus    HX. POST OPERATIVELY  . Stress fx pelvis    DUE TO AA IN 1970    SURGICAL HISTORY: Past Surgical History:  Procedure Laterality Date  . BACK SURGERY  07/04/2017   fusion above previous surgery  . BACK SURGERY  2011   metal plate l-4,L5  . Coroanry Calcium Score  03/2017   1043. Noted especially in RCA. Dense calcification, recommend Myoview over Cor CTA.  Marland Kitchen LIPOMA EXCISION N/A 10/14/2017   Procedure: EXCISION LIPOMA ON BACK;  Surgeon: Virl Cagey, Maurice Orozco;  Location: [facility];  Service: General;  Laterality: N/A;  . LIPOMA EXCISION Left 11/19/2018   Procedure: MINOR EXCISION OF SEBACEOUS CYST NECK;  Surgeon: Virl Cagey, Maurice Orozco;  Location: [facility];  Service: General;  Laterality: Left;  pt knows to arrive at 7:00  . LUNG LOBECTOMY  2012   RT UPPER LOBE  . NM MYOVIEW LTD  04/2017   EF 60%. Hypertensive response to exercise. (6:21 min, 7.7 METs) No EKG changes. LOW RISK    SOCIAL HISTORY: Social History   Socioeconomic History  . Marital status: Married    Spouse name: Not on file  . Number of children: 3  . Years of education: Not on file  . Highest education level: Not on file  Occupational History  . Not on file  Tobacco Use  . Smoking status: Former Smoker    Types: Cigarettes    Quit date: 09/08/2010    Years since quitting: 9.7  . Smokeless tobacco: Current User    Types: Chew  . Tobacco comment: still chews tobacco, he tells me he hasn't in the past 2 days.   Vaping Use  . Vaping Use: Never used  Substance and Sexual Activity  . Alcohol use: No  . Drug use: No  . Sexual activity: Not Currently  Other Topics Concern  . Not on file  Social History Narrative  . Not on file   Social Determinants of Health   Financial Resource Strain:   . Difficulty of Paying Living Expenses: Not on file   Food Insecurity:   . Worried About Charity fundraiser in the Last Year: Not on file  . Ran Out of Food in the Last Year: Not on file  Transportation Needs:   . Lack of Transportation (Medical): Not on file  . Lack of Transportation (Non-Medical): Not on file  Physical Activity:   . Days of Exercise per Week: Not on file  . Minutes of Exercise per Session: Not on file  Stress:   . Feeling of Stress : Not on file  Social Connections:   . Frequency of Communication with Friends and Family: Not on file  . Frequency of Social Gatherings with Friends and Family: Not on file  . Attends Religious Services: Not on file  . Active Member of Clubs or Organizations: Not on file  .  Attends Archivist Meetings: Not on file  . Marital Status: Not on file  Intimate Partner Violence:   . Fear of Current or Ex-Partner: Not on file  . Emotionally Abused: Not on file  . Physically Abused: Not on file  . Sexually Abused: Not on file    FAMILY HISTORY: Family History  Problem Relation Age of Onset  . Hypertension Mother   . Kidney disease Father   . Heart attack Brother   . Cancer Maternal Uncle        prostate cancer  . Cancer Maternal Uncle        prostate cancer  . Cancer Maternal Uncle        prostate cancer  . Cancer Maternal Uncle        prostate cancer    Review of Systems  All other systems reviewed and are negative.   Vital signs: -Deferred due to telephone visit  Physical Exam -Deferred due to telephone visit -Patient was alert and oriented over the phone in no acute distress   LABORATORY DATA:  CBC    Component Value Date/Time   WBC 9.2 05/29/2020 1407   RBC 3.68 (L) 05/29/2020 1407   HGB 12.0 (L) 05/29/2020 1407   HCT 37.7 (L) 05/29/2020 1407   PLT 274 05/29/2020 1407   MCV 102.4 (H) 05/29/2020 1407   MCH 32.6 05/29/2020 1407   MCHC 31.8 05/29/2020 1407   RDW 13.7 05/29/2020 1407   LYMPHSABS 1.8 05/29/2020 1407   MONOABS 0.7 05/29/2020 1407    EOSABS 0.2 05/29/2020 1407   BASOSABS 0.1 05/29/2020 1407    CMP     Component Value Date/Time   NA 134 (L) 05/29/2020 1407   K 4.0 05/29/2020 1407   CL 98 05/29/2020 1407   CO2 29 05/29/2020 1407   GLUCOSE 124 (H) 05/29/2020 1407   BUN 16 05/29/2020 1407   BUN 23.9 07/05/2015 1239   CREATININE 1.01 05/29/2020 1407   CREATININE 0.9 07/05/2015 1239   CALCIUM 9.0 05/29/2020 1407   PROT 6.6 05/29/2020 1407   ALBUMIN 3.8 05/29/2020 1407   AST 16 05/29/2020 1407   ALT 21 05/29/2020 1407   ALKPHOS 48 05/29/2020 1407   BILITOT 0.7 05/29/2020 1407   GFRNONAA >60 05/29/2020 1407   GFRAA >60 05/29/2020 1407   All questions were answered to patient's stated satisfaction. Encouraged patient to call with any new concerns or questions before his next visit to the cancer center and we can certain see him sooner, if needed.      ASSESSMENT and THERAPY PLAN:   Iron deficiency anemia 1.  Normocytic anemia: -Initial labs showed ferritin 107, previously 80.  Percent saturation 29.  Elevated MCV at 102.9 platelet count and white count was normal. -He takes oral iron tablets and B12 tablets daily. -Labs repeated on 05/29/2020 showed hemoglobin 12.0, ferritin 68, percent saturation 26, creatinine 1.01. -She was just released from the hospital for pneumonia. -He will follow-up in 6 months with repeat labs.  2.  Bilateral lung cancer: -Stage I left lung adenocarcinoma, status post SBRT on 07/10/2014. -Status post right upper lobectomy in February 2012 for right lung cancer. -CT scan on 11/03/2018 showed interval resolution of the peribronchial vascular nodularity seen previously in the right lower lobe.  Stable bilateral parenchymal scarring.  No new or progressive findings. -She does not have any new symptoms suggestive of recurrence.  We will do scans if clinical condition dictates.   Orders Placed This Encounter  Procedures  .  CBC with Differential/Platelet    Standing Status:   Future     Standing Expiration Date:   05/30/2021  . Comprehensive metabolic panel    Standing Status:   Future    Standing Expiration Date:   05/30/2021  . Ferritin    Standing Status:   Future    Standing Expiration Date:   05/30/2021  . Iron and TIBC    Standing Status:   Future    Standing Expiration Date:   05/30/2021  . Lactate dehydrogenase    Standing Status:   Future    Standing Expiration Date:   05/30/2021  . Vitamin B12    Standing Status:   Future    Standing Expiration Date:   05/30/2021  . VITAMIN D 25 Hydroxy (Vit-D Deficiency, Fractures)    Standing Status:   Future    Standing Expiration Date:   05/30/2021  . Folate    Standing Status:   Future    Standing Expiration Date:   05/30/2021    All questions were answered. The patient knows to call the clinic with any problems, questions or concerns. We can certainly see the patient much sooner if necessary. This note was electronically signed.  I provided 28 minutes of non face-to-face telephone visit time during this encounter, and > 50% was spent counseling as documented under my assessment & plan.   Glennie Isle, NP-C 05/30/2020

## 2020-05-30 NOTE — Assessment & Plan Note (Signed)
1.  Normocytic anemia: -Initial labs showed ferritin 107, previously 80.  Percent saturation 29.  Elevated MCV at 102.9 platelet count and white count was normal. -He takes oral iron tablets and B12 tablets daily. -Labs repeated on 05/29/2020 showed hemoglobin 12.0, ferritin 68, percent saturation 26, creatinine 1.01. -She was just released from the hospital for pneumonia. -He will follow-up in 6 months with repeat labs.  2.  Bilateral lung cancer: -Stage I left lung adenocarcinoma, status post SBRT on 07/10/2014. -Status post right upper lobectomy in February 2012 for right lung cancer. -CT scan on 11/03/2018 showed interval resolution of the peribronchial vascular nodularity seen previously in the right lower lobe.  Stable bilateral parenchymal scarring.  No new or progressive findings. -She does not have any new symptoms suggestive of recurrence.  We will do scans if clinical condition dictates.

## 2020-06-06 ENCOUNTER — Other Ambulatory Visit: Payer: Self-pay

## 2020-06-06 ENCOUNTER — Ambulatory Visit (HOSPITAL_COMMUNITY)
Admission: RE | Admit: 2020-06-06 | Discharge: 2020-06-06 | Disposition: A | Discharge status: Home / Self Care | Payer: Medicare Other | Source: Ambulatory Visit | Attending: Physician Assistant | Admitting: Physician Assistant

## 2020-06-06 DIAGNOSIS — Z85118 Personal history of other malignant neoplasm of bronchus and lung: Secondary | ICD-10-CM | POA: Diagnosis not present

## 2020-06-06 DIAGNOSIS — C3432 Malignant neoplasm of lower lobe, left bronchus or lung: Secondary | ICD-10-CM | POA: Diagnosis not present

## 2020-06-06 DIAGNOSIS — J439 Emphysema, unspecified: Secondary | ICD-10-CM | POA: Diagnosis not present

## 2020-06-06 DIAGNOSIS — K449 Diaphragmatic hernia without obstruction or gangrene: Secondary | ICD-10-CM | POA: Diagnosis not present

## 2020-06-06 DIAGNOSIS — I700 Atherosclerosis of aorta: Secondary | ICD-10-CM | POA: Diagnosis not present

## 2020-06-07 DIAGNOSIS — I10 Essential (primary) hypertension: Secondary | ICD-10-CM | POA: Diagnosis not present

## 2020-06-07 DIAGNOSIS — Z720 Tobacco use: Secondary | ICD-10-CM | POA: Diagnosis not present

## 2020-06-07 DIAGNOSIS — E7849 Other hyperlipidemia: Secondary | ICD-10-CM | POA: Diagnosis not present

## 2020-06-07 DIAGNOSIS — J449 Chronic obstructive pulmonary disease, unspecified: Secondary | ICD-10-CM | POA: Diagnosis not present

## 2020-06-19 DIAGNOSIS — J449 Chronic obstructive pulmonary disease, unspecified: Secondary | ICD-10-CM | POA: Diagnosis not present

## 2020-06-19 DIAGNOSIS — M5136 Other intervertebral disc degeneration, lumbar region: Secondary | ICD-10-CM | POA: Diagnosis not present

## 2020-06-19 DIAGNOSIS — M1990 Unspecified osteoarthritis, unspecified site: Secondary | ICD-10-CM | POA: Diagnosis not present

## 2020-06-19 DIAGNOSIS — Z6823 Body mass index (BMI) 23.0-23.9, adult: Secondary | ICD-10-CM | POA: Diagnosis not present

## 2020-06-19 DIAGNOSIS — C3490 Malignant neoplasm of unspecified part of unspecified bronchus or lung: Secondary | ICD-10-CM | POA: Diagnosis not present

## 2020-06-19 DIAGNOSIS — G894 Chronic pain syndrome: Secondary | ICD-10-CM | POA: Diagnosis not present

## 2020-07-07 DIAGNOSIS — Z720 Tobacco use: Secondary | ICD-10-CM | POA: Diagnosis not present

## 2020-07-07 DIAGNOSIS — J449 Chronic obstructive pulmonary disease, unspecified: Secondary | ICD-10-CM | POA: Diagnosis not present

## 2020-07-07 DIAGNOSIS — I10 Essential (primary) hypertension: Secondary | ICD-10-CM | POA: Diagnosis not present

## 2020-07-07 DIAGNOSIS — E7849 Other hyperlipidemia: Secondary | ICD-10-CM | POA: Diagnosis not present

## 2020-07-16 DIAGNOSIS — M1991 Primary osteoarthritis, unspecified site: Secondary | ICD-10-CM | POA: Diagnosis not present

## 2020-07-16 DIAGNOSIS — L309 Dermatitis, unspecified: Secondary | ICD-10-CM | POA: Diagnosis not present

## 2020-07-16 DIAGNOSIS — I10 Essential (primary) hypertension: Secondary | ICD-10-CM | POA: Diagnosis not present

## 2020-07-16 DIAGNOSIS — Z6824 Body mass index (BMI) 24.0-24.9, adult: Secondary | ICD-10-CM | POA: Diagnosis not present

## 2020-07-16 DIAGNOSIS — G894 Chronic pain syndrome: Secondary | ICD-10-CM | POA: Diagnosis not present

## 2020-07-23 DIAGNOSIS — Z23 Encounter for immunization: Secondary | ICD-10-CM | POA: Diagnosis not present

## 2020-08-07 DIAGNOSIS — E7849 Other hyperlipidemia: Secondary | ICD-10-CM | POA: Diagnosis not present

## 2020-08-07 DIAGNOSIS — Z720 Tobacco use: Secondary | ICD-10-CM | POA: Diagnosis not present

## 2020-08-07 DIAGNOSIS — I10 Essential (primary) hypertension: Secondary | ICD-10-CM | POA: Diagnosis not present

## 2020-08-07 DIAGNOSIS — J449 Chronic obstructive pulmonary disease, unspecified: Secondary | ICD-10-CM | POA: Diagnosis not present

## 2020-08-14 DIAGNOSIS — K219 Gastro-esophageal reflux disease without esophagitis: Secondary | ICD-10-CM | POA: Diagnosis not present

## 2020-08-14 DIAGNOSIS — I10 Essential (primary) hypertension: Secondary | ICD-10-CM | POA: Diagnosis not present

## 2020-08-14 DIAGNOSIS — Z6824 Body mass index (BMI) 24.0-24.9, adult: Secondary | ICD-10-CM | POA: Diagnosis not present

## 2020-08-14 DIAGNOSIS — M1991 Primary osteoarthritis, unspecified site: Secondary | ICD-10-CM | POA: Diagnosis not present

## 2020-08-14 DIAGNOSIS — G894 Chronic pain syndrome: Secondary | ICD-10-CM | POA: Diagnosis not present

## 2020-08-20 DIAGNOSIS — Z23 Encounter for immunization: Secondary | ICD-10-CM | POA: Diagnosis not present

## 2020-09-07 DIAGNOSIS — I10 Essential (primary) hypertension: Secondary | ICD-10-CM | POA: Diagnosis not present

## 2020-09-07 DIAGNOSIS — Z720 Tobacco use: Secondary | ICD-10-CM | POA: Diagnosis not present

## 2020-09-07 DIAGNOSIS — E7849 Other hyperlipidemia: Secondary | ICD-10-CM | POA: Diagnosis not present

## 2020-09-07 DIAGNOSIS — J449 Chronic obstructive pulmonary disease, unspecified: Secondary | ICD-10-CM | POA: Diagnosis not present

## 2020-10-06 DIAGNOSIS — E7849 Other hyperlipidemia: Secondary | ICD-10-CM | POA: Diagnosis not present

## 2020-10-06 DIAGNOSIS — Z720 Tobacco use: Secondary | ICD-10-CM | POA: Diagnosis not present

## 2020-10-06 DIAGNOSIS — J449 Chronic obstructive pulmonary disease, unspecified: Secondary | ICD-10-CM | POA: Diagnosis not present

## 2020-10-06 DIAGNOSIS — I10 Essential (primary) hypertension: Secondary | ICD-10-CM | POA: Diagnosis not present

## 2020-10-09 DIAGNOSIS — Z1331 Encounter for screening for depression: Secondary | ICD-10-CM | POA: Diagnosis not present

## 2020-10-09 DIAGNOSIS — G894 Chronic pain syndrome: Secondary | ICD-10-CM | POA: Diagnosis not present

## 2020-10-09 DIAGNOSIS — M4316 Spondylolisthesis, lumbar region: Secondary | ICD-10-CM | POA: Diagnosis not present

## 2020-10-09 DIAGNOSIS — Z6824 Body mass index (BMI) 24.0-24.9, adult: Secondary | ICD-10-CM | POA: Diagnosis not present

## 2020-10-25 ENCOUNTER — Other Ambulatory Visit: Payer: Self-pay | Admitting: *Deleted

## 2020-10-25 NOTE — Patient Outreach (Signed)
[facility]) Care Management  10/25/2020  [place] 12/11/1945 [id number]   EMMI-General Discharge   Outreach: Unable to reach pt at this time. Will close due to extend period of time lapse and unable to leave a HIPAA approved voice message.  Raina Mina, RN Care Management Coordinator [place] Office [phone]

## 2020-11-05 DIAGNOSIS — J449 Chronic obstructive pulmonary disease, unspecified: Secondary | ICD-10-CM | POA: Diagnosis not present

## 2020-11-05 DIAGNOSIS — I10 Essential (primary) hypertension: Secondary | ICD-10-CM | POA: Diagnosis not present

## 2020-11-05 DIAGNOSIS — E7849 Other hyperlipidemia: Secondary | ICD-10-CM | POA: Diagnosis not present

## 2020-11-05 DIAGNOSIS — Z720 Tobacco use: Secondary | ICD-10-CM | POA: Diagnosis not present

## 2020-11-06 DIAGNOSIS — I10 Essential (primary) hypertension: Secondary | ICD-10-CM | POA: Diagnosis not present

## 2020-11-06 DIAGNOSIS — G894 Chronic pain syndrome: Secondary | ICD-10-CM | POA: Diagnosis not present

## 2020-11-06 DIAGNOSIS — Z6824 Body mass index (BMI) 24.0-24.9, adult: Secondary | ICD-10-CM | POA: Diagnosis not present

## 2020-11-27 ENCOUNTER — Inpatient Hospital Stay (HOSPITAL_COMMUNITY): Payer: Medicare Other

## 2020-12-04 ENCOUNTER — Ambulatory Visit (HOSPITAL_COMMUNITY): Payer: Medicare Other | Admitting: Hematology

## 2020-12-04 DIAGNOSIS — M4316 Spondylolisthesis, lumbar region: Secondary | ICD-10-CM | POA: Diagnosis not present

## 2020-12-04 DIAGNOSIS — G894 Chronic pain syndrome: Secondary | ICD-10-CM | POA: Diagnosis not present

## 2020-12-04 DIAGNOSIS — Z6825 Body mass index (BMI) 25.0-25.9, adult: Secondary | ICD-10-CM | POA: Diagnosis not present

## 2020-12-04 DIAGNOSIS — M5136 Other intervertebral disc degeneration, lumbar region: Secondary | ICD-10-CM | POA: Diagnosis not present

## 2020-12-05 DIAGNOSIS — I10 Essential (primary) hypertension: Secondary | ICD-10-CM | POA: Diagnosis not present

## 2020-12-05 DIAGNOSIS — Z720 Tobacco use: Secondary | ICD-10-CM | POA: Diagnosis not present

## 2020-12-05 DIAGNOSIS — E7849 Other hyperlipidemia: Secondary | ICD-10-CM | POA: Diagnosis not present

## 2020-12-05 DIAGNOSIS — J449 Chronic obstructive pulmonary disease, unspecified: Secondary | ICD-10-CM | POA: Diagnosis not present

## 2020-12-19 ENCOUNTER — Other Ambulatory Visit (HOSPITAL_COMMUNITY): Payer: Self-pay | Admitting: *Deleted

## 2020-12-19 DIAGNOSIS — D509 Iron deficiency anemia, unspecified: Secondary | ICD-10-CM

## 2020-12-19 DIAGNOSIS — D508 Other iron deficiency anemias: Secondary | ICD-10-CM

## 2020-12-19 DIAGNOSIS — D500 Iron deficiency anemia secondary to blood loss (chronic): Secondary | ICD-10-CM

## 2020-12-19 DIAGNOSIS — C3490 Malignant neoplasm of unspecified part of unspecified bronchus or lung: Secondary | ICD-10-CM

## 2020-12-19 DIAGNOSIS — E538 Deficiency of other specified B group vitamins: Secondary | ICD-10-CM

## 2020-12-19 DIAGNOSIS — E559 Vitamin D deficiency, unspecified: Secondary | ICD-10-CM

## 2020-12-19 DIAGNOSIS — D649 Anemia, unspecified: Secondary | ICD-10-CM

## 2020-12-20 ENCOUNTER — Other Ambulatory Visit: Payer: Self-pay

## 2020-12-20 ENCOUNTER — Inpatient Hospital Stay (HOSPITAL_COMMUNITY): Payer: Medicare Other | Attending: Hematology

## 2020-12-20 DIAGNOSIS — E538 Deficiency of other specified B group vitamins: Secondary | ICD-10-CM

## 2020-12-20 DIAGNOSIS — C3492 Malignant neoplasm of unspecified part of left bronchus or lung: Secondary | ICD-10-CM | POA: Diagnosis not present

## 2020-12-20 DIAGNOSIS — D649 Anemia, unspecified: Secondary | ICD-10-CM | POA: Diagnosis not present

## 2020-12-20 DIAGNOSIS — D500 Iron deficiency anemia secondary to blood loss (chronic): Secondary | ICD-10-CM

## 2020-12-20 DIAGNOSIS — D509 Iron deficiency anemia, unspecified: Secondary | ICD-10-CM

## 2020-12-20 DIAGNOSIS — E559 Vitamin D deficiency, unspecified: Secondary | ICD-10-CM

## 2020-12-20 DIAGNOSIS — C3491 Malignant neoplasm of unspecified part of right bronchus or lung: Secondary | ICD-10-CM | POA: Diagnosis not present

## 2020-12-20 DIAGNOSIS — D508 Other iron deficiency anemias: Secondary | ICD-10-CM

## 2020-12-20 DIAGNOSIS — C3490 Malignant neoplasm of unspecified part of unspecified bronchus or lung: Secondary | ICD-10-CM

## 2020-12-20 LAB — COMPREHENSIVE METABOLIC PANEL
ALT: 15 U/L (ref 0–44)
AST: 17 U/L (ref 15–41)
Albumin: 4.2 g/dL (ref 3.5–5.0)
Alkaline Phosphatase: 57 U/L (ref 38–126)
Anion gap: 11 (ref 5–15)
BUN: 20 mg/dL (ref 8–23)
CO2: 27 mmol/L (ref 22–32)
Calcium: 8.8 mg/dL — ABNORMAL LOW (ref 8.9–10.3)
Chloride: 97 mmol/L — ABNORMAL LOW (ref 98–111)
Creatinine, Ser: 1.09 mg/dL (ref 0.61–1.24)
GFR, Estimated: >60 mL/min (ref >60)
Glucose, Bld: 110 mg/dL — ABNORMAL HIGH (ref 70–99)
Potassium: 3.9 mmol/L (ref 3.5–5.1)
Sodium: 135 mmol/L (ref 135–145)
Total Bilirubin: 0.5 mg/dL (ref 0.3–1.2)
Total Protein: 7.3 g/dL (ref 6.5–8.1)

## 2020-12-20 LAB — FOLATE: Folate: 6.1 ng/mL (ref >5.9)

## 2020-12-20 LAB — CBC WITH DIFFERENTIAL/PLATELET
Abs Immature Granulocytes: 0.03 10*3/uL (ref 0.00–0.07)
Basophils Absolute: 0.1 10*3/uL (ref 0.0–0.1)
Basophils Relative: 1 %
Eosinophils Absolute: 0.1 10*3/uL (ref 0.0–0.5)
Eosinophils Relative: 1 %
HCT: 43.4 % (ref 39.0–52.0)
Hemoglobin: 14.4 g/dL (ref 13.0–17.0)
Immature Granulocytes: 0 %
Lymphocytes Relative: 14 %
Lymphs Abs: 1.4 10*3/uL (ref 0.7–4.0)
MCH: 33.7 pg (ref 26.0–34.0)
MCHC: 33.2 g/dL (ref 30.0–36.0)
MCV: 101.6 fL — ABNORMAL HIGH (ref 80.0–100.0)
Monocytes Absolute: 0.6 10*3/uL (ref 0.1–1.0)
Monocytes Relative: 6 %
Neutro Abs: 8.1 10*3/uL — ABNORMAL HIGH (ref 1.7–7.7)
Neutrophils Relative %: 78 %
Platelets: 272 10*3/uL (ref 150–400)
RBC: 4.27 MIL/uL (ref 4.22–5.81)
RDW: 12.2 % (ref 11.5–15.5)
WBC: 10.3 10*3/uL (ref 4.0–10.5)
nRBC: 0 % (ref 0.0–0.2)

## 2020-12-20 LAB — VITAMIN B12: Vitamin B-12: 904 pg/mL (ref 180–914)

## 2020-12-20 LAB — VITAMIN D 25 HYDROXY (VIT D DEFICIENCY, FRACTURES): Vit D, 25-Hydroxy: 56.48 ng/mL (ref 30–100)

## 2020-12-20 LAB — IRON AND TIBC
Iron: 112 ug/dL (ref 45–182)
Saturation Ratios: 28 % (ref 17.9–39.5)
TIBC: 402 ug/dL (ref 250–450)
UIBC: 290 ug/dL

## 2020-12-20 LAB — FERRITIN: Ferritin: 81 ng/mL (ref 24–336)

## 2020-12-20 LAB — LACTATE DEHYDROGENASE: LDH: 130 U/L (ref 98–192)

## 2020-12-27 ENCOUNTER — Inpatient Hospital Stay (HOSPITAL_BASED_OUTPATIENT_CLINIC_OR_DEPARTMENT_OTHER): Payer: Medicare Other | Admitting: Hematology

## 2020-12-27 ENCOUNTER — Other Ambulatory Visit: Payer: Self-pay

## 2020-12-27 DIAGNOSIS — D509 Iron deficiency anemia, unspecified: Secondary | ICD-10-CM

## 2020-12-27 DIAGNOSIS — E538 Deficiency of other specified B group vitamins: Secondary | ICD-10-CM

## 2020-12-27 NOTE — Progress Notes (Signed)
Virtual Visit via Telephone Note  I connected with Maurice Orozco on 12/27/20 at  4:15 PM EDT by telephone and verified that I am speaking with the correct person using two identifiers.  Location: Patient: At home Provider: In office   I discussed the limitations, risks, security and privacy concerns of performing an evaluation and management service by telephone and the availability of in person appointments. I also discussed with the patient that there may be a patient responsible charge related to this service. The patient expressed understanding and agreed to proceed.   History of Present Illness: This patient is followed in our clinic for bilateral lung cancers and normocytic   Observations/Objective: He denies any bleeding per rectum or melena.  Denies any constipation or diarrhea from taking iron supplements.  He is continuing to take iron tablet daily along with B12 tablet daily.  Has chronic cough which has not changed.  No chest pains.  No hemoptysis.  Assessment and Plan:  1.  Normocytic anemia: - Continue iron and B12 supplements. - Reviewed labs from 12/20/2020 which showed B12 904.  Ferritin improved to 81.  Hemoglobin improved 14.4. - RTC 6 months for follow-up with repeat labs.  2.  Bilateral lung cancer: - Stage I left lung adenocarcinoma, status post SBRT on 07/10/2014. - Status post right upper lobectomy in February 2012 for right lung cancer. - Last CT chest on 06/06/2020 reviewed by me shows bandlike area along the major fissure in the left lower lobe likely postradiation changes. -If follow-up scan was not ordered by his PMD, will order at next visit.   Follow Up Instructions: RTC 6 months for follow-up.   I discussed the assessment and treatment plan with the patient. The patient was provided an opportunity to ask questions and all were answered. The patient agreed with the plan and demonstrated an understanding of the instructions.   The patient was advised to  call back or seek an in-person evaluation if the symptoms worsen or if the condition fails to improve as anticipated.  I provided 11 minutes of non-face-to-face time during this encounter.   Derek Jack, MD

## 2021-01-02 DIAGNOSIS — Z6824 Body mass index (BMI) 24.0-24.9, adult: Secondary | ICD-10-CM | POA: Diagnosis not present

## 2021-01-02 DIAGNOSIS — I10 Essential (primary) hypertension: Secondary | ICD-10-CM | POA: Diagnosis not present

## 2021-01-02 DIAGNOSIS — G894 Chronic pain syndrome: Secondary | ICD-10-CM | POA: Diagnosis not present

## 2021-01-05 DIAGNOSIS — J449 Chronic obstructive pulmonary disease, unspecified: Secondary | ICD-10-CM | POA: Diagnosis not present

## 2021-01-05 DIAGNOSIS — E7849 Other hyperlipidemia: Secondary | ICD-10-CM | POA: Diagnosis not present

## 2021-01-05 DIAGNOSIS — I10 Essential (primary) hypertension: Secondary | ICD-10-CM | POA: Diagnosis not present

## 2021-01-05 DIAGNOSIS — Z720 Tobacco use: Secondary | ICD-10-CM | POA: Diagnosis not present

## 2021-01-23 DIAGNOSIS — Z981 Arthrodesis status: Secondary | ICD-10-CM | POA: Diagnosis not present

## 2021-01-23 DIAGNOSIS — M4316 Spondylolisthesis, lumbar region: Secondary | ICD-10-CM | POA: Diagnosis not present

## 2021-01-23 DIAGNOSIS — M4186 Other forms of scoliosis, lumbar region: Secondary | ICD-10-CM | POA: Diagnosis not present

## 2021-01-23 DIAGNOSIS — Z888 Allergy status to other drugs, medicaments and biological substances status: Secondary | ICD-10-CM | POA: Diagnosis not present

## 2021-01-23 DIAGNOSIS — M5416 Radiculopathy, lumbar region: Secondary | ICD-10-CM | POA: Diagnosis not present

## 2021-01-23 DIAGNOSIS — M5441 Lumbago with sciatica, right side: Secondary | ICD-10-CM | POA: Diagnosis not present

## 2021-01-23 DIAGNOSIS — M438X5 Other specified deforming dorsopathies, thoracolumbar region: Secondary | ICD-10-CM | POA: Diagnosis not present

## 2021-01-23 DIAGNOSIS — M5442 Lumbago with sciatica, left side: Secondary | ICD-10-CM | POA: Diagnosis not present

## 2021-01-23 DIAGNOSIS — M419 Scoliosis, unspecified: Secondary | ICD-10-CM | POA: Diagnosis not present

## 2021-01-23 DIAGNOSIS — M438X7 Other specified deforming dorsopathies, lumbosacral region: Secondary | ICD-10-CM | POA: Diagnosis not present

## 2021-01-23 DIAGNOSIS — Z885 Allergy status to narcotic agent status: Secondary | ICD-10-CM | POA: Diagnosis not present

## 2021-01-23 DIAGNOSIS — Z886 Allergy status to analgesic agent status: Secondary | ICD-10-CM | POA: Diagnosis not present

## 2021-01-30 DIAGNOSIS — G894 Chronic pain syndrome: Secondary | ICD-10-CM | POA: Diagnosis not present

## 2021-01-30 DIAGNOSIS — M48061 Spinal stenosis, lumbar region without neurogenic claudication: Secondary | ICD-10-CM | POA: Diagnosis not present

## 2021-01-30 DIAGNOSIS — M1991 Primary osteoarthritis, unspecified site: Secondary | ICD-10-CM | POA: Diagnosis not present

## 2021-01-30 DIAGNOSIS — C3432 Malignant neoplasm of lower lobe, left bronchus or lung: Secondary | ICD-10-CM | POA: Diagnosis not present

## 2021-01-30 DIAGNOSIS — Z6824 Body mass index (BMI) 24.0-24.9, adult: Secondary | ICD-10-CM | POA: Diagnosis not present

## 2021-02-05 DIAGNOSIS — J449 Chronic obstructive pulmonary disease, unspecified: Secondary | ICD-10-CM | POA: Diagnosis not present

## 2021-02-05 DIAGNOSIS — I10 Essential (primary) hypertension: Secondary | ICD-10-CM | POA: Diagnosis not present

## 2021-02-05 DIAGNOSIS — E7849 Other hyperlipidemia: Secondary | ICD-10-CM | POA: Diagnosis not present

## 2021-02-05 DIAGNOSIS — Z720 Tobacco use: Secondary | ICD-10-CM | POA: Diagnosis not present

## 2021-02-20 DIAGNOSIS — Z888 Allergy status to other drugs, medicaments and biological substances status: Secondary | ICD-10-CM | POA: Diagnosis not present

## 2021-02-20 DIAGNOSIS — Z885 Allergy status to narcotic agent status: Secondary | ICD-10-CM | POA: Diagnosis not present

## 2021-02-20 DIAGNOSIS — M47816 Spondylosis without myelopathy or radiculopathy, lumbar region: Secondary | ICD-10-CM | POA: Diagnosis not present

## 2021-02-20 DIAGNOSIS — Z981 Arthrodesis status: Secondary | ICD-10-CM | POA: Diagnosis not present

## 2021-02-20 DIAGNOSIS — M5416 Radiculopathy, lumbar region: Secondary | ICD-10-CM | POA: Diagnosis not present

## 2021-02-20 DIAGNOSIS — M4856XA Collapsed vertebra, not elsewhere classified, lumbar region, initial encounter for fracture: Secondary | ICD-10-CM | POA: Diagnosis not present

## 2021-02-20 DIAGNOSIS — M4180 Other forms of scoliosis, site unspecified: Secondary | ICD-10-CM | POA: Diagnosis not present

## 2021-02-20 DIAGNOSIS — M4726 Other spondylosis with radiculopathy, lumbar region: Secondary | ICD-10-CM | POA: Diagnosis not present

## 2021-02-20 DIAGNOSIS — M5126 Other intervertebral disc displacement, lumbar region: Secondary | ICD-10-CM | POA: Diagnosis not present

## 2021-02-20 DIAGNOSIS — M48061 Spinal stenosis, lumbar region without neurogenic claudication: Secondary | ICD-10-CM | POA: Diagnosis not present

## 2021-02-20 DIAGNOSIS — M5136 Other intervertebral disc degeneration, lumbar region: Secondary | ICD-10-CM | POA: Diagnosis not present

## 2021-02-20 DIAGNOSIS — M4854XA Collapsed vertebra, not elsewhere classified, thoracic region, initial encounter for fracture: Secondary | ICD-10-CM | POA: Diagnosis not present

## 2021-02-20 DIAGNOSIS — Z886 Allergy status to analgesic agent status: Secondary | ICD-10-CM | POA: Diagnosis not present

## 2021-02-20 DIAGNOSIS — G9589 Other specified diseases of spinal cord: Secondary | ICD-10-CM | POA: Diagnosis not present

## 2021-02-20 DIAGNOSIS — M4186 Other forms of scoliosis, lumbar region: Secondary | ICD-10-CM | POA: Diagnosis not present

## 2021-02-28 DIAGNOSIS — Z0000 Encounter for general adult medical examination without abnormal findings: Secondary | ICD-10-CM | POA: Diagnosis not present

## 2021-02-28 DIAGNOSIS — I700 Atherosclerosis of aorta: Secondary | ICD-10-CM | POA: Diagnosis not present

## 2021-02-28 DIAGNOSIS — M4316 Spondylolisthesis, lumbar region: Secondary | ICD-10-CM | POA: Diagnosis not present

## 2021-02-28 DIAGNOSIS — Z1331 Encounter for screening for depression: Secondary | ICD-10-CM | POA: Diagnosis not present

## 2021-02-28 DIAGNOSIS — M48061 Spinal stenosis, lumbar region without neurogenic claudication: Secondary | ICD-10-CM | POA: Diagnosis not present

## 2021-02-28 DIAGNOSIS — G894 Chronic pain syndrome: Secondary | ICD-10-CM | POA: Diagnosis not present

## 2021-02-28 DIAGNOSIS — E7849 Other hyperlipidemia: Secondary | ICD-10-CM | POA: Diagnosis not present

## 2021-02-28 DIAGNOSIS — Z6824 Body mass index (BMI) 24.0-24.9, adult: Secondary | ICD-10-CM | POA: Diagnosis not present

## 2021-02-28 DIAGNOSIS — Z0001 Encounter for general adult medical examination with abnormal findings: Secondary | ICD-10-CM | POA: Diagnosis not present

## 2021-02-28 DIAGNOSIS — E785 Hyperlipidemia, unspecified: Secondary | ICD-10-CM | POA: Diagnosis not present

## 2021-02-28 DIAGNOSIS — R7309 Other abnormal glucose: Secondary | ICD-10-CM | POA: Diagnosis not present

## 2021-02-28 DIAGNOSIS — I10 Essential (primary) hypertension: Secondary | ICD-10-CM | POA: Diagnosis not present

## 2021-02-28 DIAGNOSIS — J449 Chronic obstructive pulmonary disease, unspecified: Secondary | ICD-10-CM | POA: Diagnosis not present

## 2021-02-28 DIAGNOSIS — Z1389 Encounter for screening for other disorder: Secondary | ICD-10-CM | POA: Diagnosis not present

## 2021-02-28 DIAGNOSIS — M5136 Other intervertebral disc degeneration, lumbar region: Secondary | ICD-10-CM | POA: Diagnosis not present

## 2021-03-07 DIAGNOSIS — I10 Essential (primary) hypertension: Secondary | ICD-10-CM | POA: Diagnosis not present

## 2021-03-07 DIAGNOSIS — E782 Mixed hyperlipidemia: Secondary | ICD-10-CM | POA: Diagnosis not present

## 2021-03-07 DIAGNOSIS — J449 Chronic obstructive pulmonary disease, unspecified: Secondary | ICD-10-CM | POA: Diagnosis not present

## 2021-03-25 DIAGNOSIS — Z20822 Contact with and (suspected) exposure to covid-19: Secondary | ICD-10-CM | POA: Diagnosis not present

## 2021-03-27 DIAGNOSIS — M5442 Lumbago with sciatica, left side: Secondary | ICD-10-CM | POA: Diagnosis not present

## 2021-03-27 DIAGNOSIS — G8929 Other chronic pain: Secondary | ICD-10-CM | POA: Diagnosis not present

## 2021-03-27 DIAGNOSIS — D518 Other vitamin B12 deficiency anemias: Secondary | ICD-10-CM | POA: Diagnosis not present

## 2021-03-27 DIAGNOSIS — M5441 Lumbago with sciatica, right side: Secondary | ICD-10-CM | POA: Diagnosis not present

## 2021-03-28 DIAGNOSIS — I10 Essential (primary) hypertension: Secondary | ICD-10-CM | POA: Diagnosis not present

## 2021-03-28 DIAGNOSIS — G894 Chronic pain syndrome: Secondary | ICD-10-CM | POA: Diagnosis not present

## 2021-03-28 DIAGNOSIS — Z6823 Body mass index (BMI) 23.0-23.9, adult: Secondary | ICD-10-CM | POA: Diagnosis not present

## 2021-03-28 DIAGNOSIS — C3490 Malignant neoplasm of unspecified part of unspecified bronchus or lung: Secondary | ICD-10-CM | POA: Diagnosis not present

## 2021-03-28 DIAGNOSIS — J449 Chronic obstructive pulmonary disease, unspecified: Secondary | ICD-10-CM | POA: Diagnosis not present

## 2021-04-04 DIAGNOSIS — Z87891 Personal history of nicotine dependence: Secondary | ICD-10-CM | POA: Diagnosis not present

## 2021-04-04 DIAGNOSIS — Z79899 Other long term (current) drug therapy: Secondary | ICD-10-CM | POA: Diagnosis not present

## 2021-04-04 DIAGNOSIS — M545 Low back pain, unspecified: Secondary | ICD-10-CM | POA: Diagnosis not present

## 2021-04-04 DIAGNOSIS — I10 Essential (primary) hypertension: Secondary | ICD-10-CM | POA: Diagnosis not present

## 2021-04-04 DIAGNOSIS — Z981 Arthrodesis status: Secondary | ICD-10-CM | POA: Diagnosis not present

## 2021-04-04 DIAGNOSIS — J449 Chronic obstructive pulmonary disease, unspecified: Secondary | ICD-10-CM | POA: Diagnosis not present

## 2021-04-04 DIAGNOSIS — M4186 Other forms of scoliosis, lumbar region: Secondary | ICD-10-CM | POA: Diagnosis not present

## 2021-04-04 DIAGNOSIS — D649 Anemia, unspecified: Secondary | ICD-10-CM | POA: Diagnosis not present

## 2021-04-04 DIAGNOSIS — R2689 Other abnormalities of gait and mobility: Secondary | ICD-10-CM | POA: Diagnosis not present

## 2021-04-04 DIAGNOSIS — M4180 Other forms of scoliosis, site unspecified: Secondary | ICD-10-CM | POA: Diagnosis not present

## 2021-04-04 DIAGNOSIS — Z85118 Personal history of other malignant neoplasm of bronchus and lung: Secondary | ICD-10-CM | POA: Diagnosis not present

## 2021-04-04 DIAGNOSIS — E7800 Pure hypercholesterolemia, unspecified: Secondary | ICD-10-CM | POA: Diagnosis not present

## 2021-04-04 DIAGNOSIS — M5416 Radiculopathy, lumbar region: Secondary | ICD-10-CM | POA: Diagnosis not present

## 2021-04-04 DIAGNOSIS — K219 Gastro-esophageal reflux disease without esophagitis: Secondary | ICD-10-CM | POA: Diagnosis not present

## 2021-04-05 DIAGNOSIS — Z981 Arthrodesis status: Secondary | ICD-10-CM | POA: Diagnosis not present

## 2021-04-05 DIAGNOSIS — R2689 Other abnormalities of gait and mobility: Secondary | ICD-10-CM | POA: Diagnosis not present

## 2021-04-05 DIAGNOSIS — E7800 Pure hypercholesterolemia, unspecified: Secondary | ICD-10-CM | POA: Diagnosis not present

## 2021-04-05 DIAGNOSIS — M5416 Radiculopathy, lumbar region: Secondary | ICD-10-CM | POA: Diagnosis not present

## 2021-04-05 DIAGNOSIS — M545 Low back pain, unspecified: Secondary | ICD-10-CM | POA: Diagnosis not present

## 2021-04-05 DIAGNOSIS — M4186 Other forms of scoliosis, lumbar region: Secondary | ICD-10-CM | POA: Diagnosis not present

## 2021-04-05 DIAGNOSIS — I10 Essential (primary) hypertension: Secondary | ICD-10-CM | POA: Diagnosis not present

## 2021-04-07 DIAGNOSIS — E782 Mixed hyperlipidemia: Secondary | ICD-10-CM | POA: Diagnosis not present

## 2021-04-07 DIAGNOSIS — J449 Chronic obstructive pulmonary disease, unspecified: Secondary | ICD-10-CM | POA: Diagnosis not present

## 2021-04-07 DIAGNOSIS — I10 Essential (primary) hypertension: Secondary | ICD-10-CM | POA: Diagnosis not present

## 2021-04-10 DIAGNOSIS — Z881 Allergy status to other antibiotic agents status: Secondary | ICD-10-CM | POA: Diagnosis not present

## 2021-04-10 DIAGNOSIS — Z886 Allergy status to analgesic agent status: Secondary | ICD-10-CM | POA: Diagnosis not present

## 2021-04-10 DIAGNOSIS — Z4803 Encounter for change or removal of drains: Secondary | ICD-10-CM | POA: Diagnosis not present

## 2021-04-10 DIAGNOSIS — Z888 Allergy status to other drugs, medicaments and biological substances status: Secondary | ICD-10-CM | POA: Diagnosis not present

## 2021-04-16 DIAGNOSIS — Z4789 Encounter for other orthopedic aftercare: Secondary | ICD-10-CM | POA: Diagnosis not present

## 2021-04-16 DIAGNOSIS — Z981 Arthrodesis status: Secondary | ICD-10-CM | POA: Diagnosis not present

## 2021-04-25 DIAGNOSIS — M48061 Spinal stenosis, lumbar region without neurogenic claudication: Secondary | ICD-10-CM | POA: Diagnosis not present

## 2021-04-25 DIAGNOSIS — C3432 Malignant neoplasm of lower lobe, left bronchus or lung: Secondary | ICD-10-CM | POA: Diagnosis not present

## 2021-04-25 DIAGNOSIS — M5136 Other intervertebral disc degeneration, lumbar region: Secondary | ICD-10-CM | POA: Diagnosis not present

## 2021-04-25 DIAGNOSIS — G894 Chronic pain syndrome: Secondary | ICD-10-CM | POA: Diagnosis not present

## 2021-04-25 DIAGNOSIS — G4709 Other insomnia: Secondary | ICD-10-CM | POA: Diagnosis not present

## 2021-04-25 DIAGNOSIS — Z681 Body mass index (BMI) 19 or less, adult: Secondary | ICD-10-CM | POA: Diagnosis not present

## 2021-05-06 DIAGNOSIS — M461 Sacroiliitis, not elsewhere classified: Secondary | ICD-10-CM | POA: Diagnosis not present

## 2021-05-06 DIAGNOSIS — Z4789 Encounter for other orthopedic aftercare: Secondary | ICD-10-CM | POA: Diagnosis not present

## 2021-05-06 DIAGNOSIS — Z886 Allergy status to analgesic agent status: Secondary | ICD-10-CM | POA: Diagnosis not present

## 2021-05-06 DIAGNOSIS — M160 Bilateral primary osteoarthritis of hip: Secondary | ICD-10-CM | POA: Diagnosis not present

## 2021-05-06 DIAGNOSIS — Z981 Arthrodesis status: Secondary | ICD-10-CM | POA: Diagnosis not present

## 2021-05-06 DIAGNOSIS — M438X6 Other specified deforming dorsopathies, lumbar region: Secondary | ICD-10-CM | POA: Diagnosis not present

## 2021-05-06 DIAGNOSIS — M8588 Other specified disorders of bone density and structure, other site: Secondary | ICD-10-CM | POA: Diagnosis not present

## 2021-05-06 DIAGNOSIS — Z888 Allergy status to other drugs, medicaments and biological substances status: Secondary | ICD-10-CM | POA: Diagnosis not present

## 2021-05-23 DIAGNOSIS — Z6823 Body mass index (BMI) 23.0-23.9, adult: Secondary | ICD-10-CM | POA: Diagnosis not present

## 2021-05-23 DIAGNOSIS — C3432 Malignant neoplasm of lower lobe, left bronchus or lung: Secondary | ICD-10-CM | POA: Diagnosis not present

## 2021-05-23 DIAGNOSIS — M48061 Spinal stenosis, lumbar region without neurogenic claudication: Secondary | ICD-10-CM | POA: Diagnosis not present

## 2021-05-23 DIAGNOSIS — G894 Chronic pain syndrome: Secondary | ICD-10-CM | POA: Diagnosis not present

## 2021-06-20 DIAGNOSIS — Z6823 Body mass index (BMI) 23.0-23.9, adult: Secondary | ICD-10-CM | POA: Diagnosis not present

## 2021-06-20 DIAGNOSIS — I10 Essential (primary) hypertension: Secondary | ICD-10-CM | POA: Diagnosis not present

## 2021-06-20 DIAGNOSIS — C3432 Malignant neoplasm of lower lobe, left bronchus or lung: Secondary | ICD-10-CM | POA: Diagnosis not present

## 2021-06-20 DIAGNOSIS — G8929 Other chronic pain: Secondary | ICD-10-CM | POA: Diagnosis not present

## 2021-06-20 DIAGNOSIS — J449 Chronic obstructive pulmonary disease, unspecified: Secondary | ICD-10-CM | POA: Diagnosis not present

## 2021-06-28 ENCOUNTER — Other Ambulatory Visit: Payer: Self-pay

## 2021-06-28 ENCOUNTER — Inpatient Hospital Stay (HOSPITAL_COMMUNITY): Payer: Medicare Other | Attending: Hematology

## 2021-06-28 DIAGNOSIS — D649 Anemia, unspecified: Secondary | ICD-10-CM | POA: Insufficient documentation

## 2021-06-28 DIAGNOSIS — C3432 Malignant neoplasm of lower lobe, left bronchus or lung: Secondary | ICD-10-CM | POA: Insufficient documentation

## 2021-06-28 DIAGNOSIS — Z923 Personal history of irradiation: Secondary | ICD-10-CM | POA: Diagnosis not present

## 2021-06-28 DIAGNOSIS — K921 Melena: Secondary | ICD-10-CM | POA: Diagnosis not present

## 2021-06-28 DIAGNOSIS — Z720 Tobacco use: Secondary | ICD-10-CM | POA: Diagnosis not present

## 2021-06-28 DIAGNOSIS — D509 Iron deficiency anemia, unspecified: Secondary | ICD-10-CM

## 2021-06-28 DIAGNOSIS — E538 Deficiency of other specified B group vitamins: Secondary | ICD-10-CM | POA: Diagnosis not present

## 2021-06-28 LAB — IRON AND TIBC
Iron: 40 ug/dL — ABNORMAL LOW (ref 45–182)
Saturation Ratios: 11 % — ABNORMAL LOW (ref 17.9–39.5)
TIBC: 376 ug/dL (ref 250–450)
UIBC: 336 ug/dL

## 2021-06-28 LAB — CBC WITH DIFFERENTIAL/PLATELET
Abs Immature Granulocytes: 0.03 10*3/uL (ref 0.00–0.07)
Basophils Absolute: 0.1 10*3/uL (ref 0.0–0.1)
Basophils Relative: 1 %
Eosinophils Absolute: 0.2 10*3/uL (ref 0.0–0.5)
Eosinophils Relative: 3 %
HCT: 40 % (ref 39.0–52.0)
Hemoglobin: 12.9 g/dL — ABNORMAL LOW (ref 13.0–17.0)
Immature Granulocytes: 0 %
Lymphocytes Relative: 20 %
Lymphs Abs: 1.4 10*3/uL (ref 0.7–4.0)
MCH: 31.4 pg (ref 26.0–34.0)
MCHC: 32.3 g/dL (ref 30.0–36.0)
MCV: 97.3 fL (ref 80.0–100.0)
Monocytes Absolute: 0.6 10*3/uL (ref 0.1–1.0)
Monocytes Relative: 8 %
Neutro Abs: 4.6 10*3/uL (ref 1.7–7.7)
Neutrophils Relative %: 68 %
Platelets: 235 10*3/uL (ref 150–400)
RBC: 4.11 MIL/uL — ABNORMAL LOW (ref 4.22–5.81)
RDW: 13.1 % (ref 11.5–15.5)
WBC: 6.8 10*3/uL (ref 4.0–10.5)
nRBC: 0 % (ref 0.0–0.2)

## 2021-06-28 LAB — VITAMIN B12: Vitamin B-12: 2409 pg/mL — ABNORMAL HIGH (ref 180–914)

## 2021-06-28 LAB — LACTATE DEHYDROGENASE: LDH: 120 U/L (ref 98–192)

## 2021-06-28 LAB — FERRITIN: Ferritin: 33 ng/mL (ref 24–336)

## 2021-06-29 NOTE — Progress Notes (Signed)
Maurice Orozco, [place] [address]   CLINIC:  Medical Oncology/Hematology  PCP:  [facility], [place] / [place] [address] [phone]   REASON FOR VISIT:  Follow-up for bilateral lung cancers and normocytic anemia  PRIOR THERAPY: none  NGS Results: not done  CURRENT THERAPY: surveillance  BRIEF ONCOLOGIC HISTORY:  Oncology History  Adenocarcinoma of lung ([place])  09/30/2010 Cancer Staging   CT of chest showed spiculated lesion   10/15/2010 Surgery   Right upper lobectomy by Dr. Arlyce Dice   10/16/2010 Remission     10/24/2013 Imaging   CT Chest-  Enlarging subpleural nodule in the left lower lobe, now 9 mm, with distortion of the overlying pleura. Finding is concerning for a small bronchogenic carcinoma   01/23/2014 Imaging   CT Chest- Left lower lobe nodule has enlarged from 11/01/2012 and is worrisome for primary bronchogenic carcinoma.    07/04/2014 - 07/10/2014 Radiation Therapy   SBRT by Dr. Isidore Moos in 3 fractions.   07/05/2015 Imaging   perifissural nodule in LLL appears slightly smaller with parenchymal opacity attrib to XRT, likely inflamm pathcy airspace dz more inferiorly in LLL, airspace dz in RLL resolved. no metastatic disease   Primary cancer of left lower lobe of lung ([place])  06/18/2014 Initial Diagnosis   Left lower lobe nodule suspicious for primary lung carcinoma   07/04/2014 - 07/10/2014 Radiation Therapy   SBRT   10/12/2014 Imaging   No acute findings within the chest. Decrease in size of left lower lobe perifissural nodule compatible with response to therapy. No new or progressive disease identified within the chest.     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 male, returns for routine follow-up of his  normocytic anemia. Maurice Orozco was last seen on 12/27/2020.   Today he reports feeling well. He denies hematuria and hematochezia; he is taking 1 iron tablet daily, and he reports black stools. He denies constipation. He had back surgery on 07/28. He denies CP and cough. He reports difficulty urinating due to slow flow.   REVIEW OF SYSTEMS:  Review of Systems  Constitutional:  Positive for fatigue (40%). Negative for appetite change.  Respiratory:  Positive for shortness of breath (COPD). Negative for cough.   Cardiovascular:  Negative for chest pain.  Gastrointestinal:  Negative for blood in stool and constipation.  Genitourinary:  Positive for difficulty urinating. Negative for hematuria.   Musculoskeletal:  Positive for back pain (5/10).  Psychiatric/Behavioral:  Positive for depression. The patient is nervous/anxious.   All other systems reviewed and are negative.  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 dense RCA calcification --Non-ischemic Myoview   History of radiation therapy 07/04/14, 07/06/14, 07/10/14   LLL lung nodule/54 Gy/3 fx- SBRT  Hypercholesterolemia 05/06/2011   Pneumonia due to Streptococcus    HX. POST OPERATIVELY   Stress fx pelvis    DUE TO AA IN 1970   Past Surgical History:  Procedure Laterality Date   BACK SURGERY  07/04/2017   fusion above previous surgery   BACK SURGERY  2011   metal plate l-4,L5   Coroanry Calcium Score  03/2017   1043. Noted especially in RCA. Dense calcification, recommend Myoview over  Cor CTA.   LIPOMA EXCISION N/A 10/14/2017   Procedure: EXCISION LIPOMA ON BACK;  Surgeon: Virl Cagey, MD;  Location: [facility];  Service: General;  Laterality: N/A;   LIPOMA EXCISION Left 11/19/2018   Procedure: MINOR EXCISION OF SEBACEOUS CYST NECK;  Surgeon: Virl Cagey, MD;  Location: [facility];  Service: General;  Laterality: Left;  pt knows to arrive at 7:00   LUNG LOBECTOMY  2012   RT [place]  04/2017   EF 60%. Hypertensive response to exercise. (6:21 min, 7.7 METs) No EKG changes. LOW RISK    SOCIAL HISTORY:  Social History   Socioeconomic History   Marital status: Married    Spouse name: Not on file   Number of children: 3   Years of education: Not on file   Highest education level: Not on file  Occupational History   Not on file  Tobacco Use   Smoking status: Former    Types: Cigarettes    Quit date: 09/08/2010    Years since quitting: 10.8   Smokeless tobacco: Current    Types: Chew   Tobacco comments:    still chews tobacco, he tells me he hasn't in the past 2 days.   Vaping Use   Vaping Use: Never used  Substance and Sexual Activity   Alcohol use: No   Drug use: No   Sexual activity: Not Currently  Other Topics Concern   Not on file  Social History Narrative   Not on file   Social Determinants of Health   Financial Resource Strain: Not on file  Food Insecurity: Not on file  Transportation Needs: Not on file  Physical Activity: Not on file  Stress: Not on file  Social Connections: Not on file  Intimate Partner Violence: Not on file    FAMILY HISTORY:  Family History  Problem Relation Age of Onset   Hypertension Mother    Kidney disease Father    Heart attack Brother    Cancer Maternal Uncle        prostate cancer   Cancer Maternal Uncle        prostate cancer   Cancer Maternal Uncle        prostate cancer   Cancer Maternal Uncle        prostate cancer    CURRENT MEDICATIONS:  Current Outpatient Medications   Medication Sig Dispense Refill   albuterol (PROVENTIL HFA;VENTOLIN HFA) 108 (90 Base) MCG/ACT inhaler Inhale 2 puffs into the lungs every 4 (four) hours as needed for wheezing or shortness of breath. 2 Inhaler 3   albuterol (PROVENTIL) (2.5 MG/3ML) 0.083% nebulizer solution Take 3 mLs (2.5 mg total) by nebulization every 4 (four) hours as needed for wheezing or shortness of breath. 75 mL 2   cholecalciferol (VITAMIN D3) 25 MCG (1000 UNIT) tablet Take 1,000 Units by mouth daily.     cimetidine (TAGAMET) 200 MG tablet Take 400 mg by mouth daily as needed (for heartburn).     fluticasone (FLONASE)  50 MCG/ACT nasal spray Place 2 sprays into both nostrils daily as needed for allergies.      hydrochlorothiazide (HYDRODIURIL) 25 MG tablet Take 25 mg by mouth daily.     HYDROcodone-acetaminophen (NORCO) 10-325 MG tablet Take 1-2 tablets by mouth every 4 (four) hours as needed for moderate pain. 30 tablet 0   Iron Combinations (CHROMAGEN) capsule Take 1 capsule by mouth daily.     Menthol-Methyl Salicylate (MUSCLE RUB EX) Apply 1 application topically 4 (four) times daily as needed (for pain).      neomycin-polymyxin-hydrocortisone (CORTISPORIN) 3.5-10000-1 OTIC suspension Place 1 drop into both ears as needed.     Omega-3 Fatty Acids (FISH OIL) 1000 MG CAPS Take 1,000 mg by mouth daily.     pantoprazole (PROTONIX) 40 MG tablet Take 40 mg by mouth 2 (two) times daily.     polyethylene glycol (MIRALAX / GLYCOLAX) packet Take 17 g by mouth daily as needed (constipation).      pregabalin (LYRICA) 75 MG capsule Take 75 mg by mouth daily.     rosuvastatin (CRESTOR) 40 MG tablet Take 1 tablet (40 mg total) by mouth daily. 90 tablet 3   vitamin B-12 (CYANOCOBALAMIN) 1000 MCG tablet Take 1,000 mcg by mouth daily.     No current facility-administered medications for this visit.    ALLERGIES:  Allergies  Allergen Reactions   Fentanyl Other (See Comments)    CAUSED HALLUCINATIONS/05-03-13 pt reports it was  oral fentanyl that caused hallucinations CAUSED HALLUCINATIONS/05-03-13 pt reports it was oral fentanyl that caused hallucinations   Gabapentin Other (See Comments)    Bones throbbed, headaches, weakness   Aspirin Nausea Only    PHYSICAL EXAM:  Performance status (ECOG): 1 - Symptomatic but completely ambulatory  There were no vitals filed for this visit. Wt Readings from Last 3 Encounters:  04/26/20 151 lb 10.8 oz (68.8 kg)  11/16/18 158 lb 9.6 oz (71.9 kg)  11/01/18 160 lb (72.6 kg)   Physical Exam Vitals reviewed.  Constitutional:      Appearance: Normal appearance.  Cardiovascular:     Rate and Rhythm: Normal rate and regular rhythm.     Pulses: Normal pulses.     Heart sounds: Normal heart sounds.  Pulmonary:     Effort: Pulmonary effort is normal.     Breath sounds: Normal breath sounds.  Neurological:     General: No focal deficit present.     Mental Status: He is alert and oriented to person, place, and time.  Psychiatric:        Mood and Affect: Mood normal.        Behavior: Behavior normal.     LABORATORY DATA:  I have reviewed the labs as listed.  CBC Latest Ref Rng & Units 06/28/2021 12/20/2020 05/29/2020  WBC 4.0 - 10.5 K/uL 6.8 10.3 9.2  Hemoglobin 13.0 - 17.0 g/dL 12.9(L) 14.4 12.0(L)  Hematocrit 39.0 - 52.0 % 40.0 43.4 37.7(L)  Platelets 150 - 400 K/uL 235 272 274   CMP Latest Ref Rng & Units 12/20/2020 05/29/2020 04/28/2020  Glucose 70 - 99 mg/dL 110(H) 124(H) 148(H)  BUN 8 - 23 mg/dL 20 16 44(H)  Creatinine 0.61 - 1.24 mg/dL 1.09 1.01 2.12(H)  Sodium 135 - 145 mmol/L 135 134(L) 137  Potassium 3.5 - 5.1 mmol/L 3.9 4.0 3.5  Chloride 98 - 111 mmol/L 97(L) 98 105  CO2 22 - 32 mmol/L 27 29 22   Calcium 8.9 - 10.3 mg/dL 8.8(L) 9.0 8.9  Total Protein 6.5 -  8.1 g/dL 7.3 6.6 -  Total Bilirubin 0.3 - 1.2 mg/dL 0.5 0.7 -  Alkaline Phos 38 - 126 U/L 57 48 -  AST 15 - 41 U/L 17 16 -  ALT 0 - 44 U/L 15 21 -    DIAGNOSTIC IMAGING:  I have independently reviewed  the scans and discussed with the patient. No results found.   ASSESSMENT:  1.  Normocytic anemia: - He is on iron and B12 supplements.   2.  Bilateral lung cancer: - Stage I left lung adenocarcinoma, status post SBRT on 07/10/2014. - Status post right upper lobectomy in February 2012 for right lung cancer. - Last CT chest on 06/06/2020 reviewed by me shows bandlike area along the major fissure in the left lower lobe likely postradiation changes.    PLAN:  1.  Normocytic anemia: - Denies any bleeding per rectum.  However has dark stools. - Currently taking iron tablet daily. - Ferritin is 33, down from 81 in April.  Hemoglobin 12.9, down from 14 in April. - Recommend increasing iron tablet to twice daily. - RTC 3 months with repeat labs.  If there is no improvement, will consider parenteral iron therapy.    2.  B12 deficiency: - B12 level is 2409.  He is taking B12 5 mg daily. - Recommend decreasing B12 to 1 mg tablet daily.    Orders placed this encounter:  No orders of the defined types were placed in this encounter.    Derek Jack, MD [place] [phone]   I, Thana Ates, am acting as a scribe for Dr. Derek Jack.  I, Derek Jack MD, have reviewed the above documentation for accuracy and completeness, and I agree with the above.

## 2021-07-01 ENCOUNTER — Other Ambulatory Visit: Payer: Self-pay

## 2021-07-01 ENCOUNTER — Inpatient Hospital Stay (HOSPITAL_BASED_OUTPATIENT_CLINIC_OR_DEPARTMENT_OTHER): Payer: Medicare Other | Admitting: Hematology

## 2021-07-01 DIAGNOSIS — Z720 Tobacco use: Secondary | ICD-10-CM | POA: Diagnosis not present

## 2021-07-01 DIAGNOSIS — K921 Melena: Secondary | ICD-10-CM | POA: Diagnosis not present

## 2021-07-01 DIAGNOSIS — C3432 Malignant neoplasm of lower lobe, left bronchus or lung: Secondary | ICD-10-CM | POA: Diagnosis not present

## 2021-07-01 DIAGNOSIS — D649 Anemia, unspecified: Secondary | ICD-10-CM | POA: Diagnosis not present

## 2021-07-01 DIAGNOSIS — Z923 Personal history of irradiation: Secondary | ICD-10-CM | POA: Diagnosis not present

## 2021-07-01 DIAGNOSIS — D513 Other dietary vitamin B12 deficiency anemia: Secondary | ICD-10-CM

## 2021-07-01 DIAGNOSIS — D509 Iron deficiency anemia, unspecified: Secondary | ICD-10-CM | POA: Diagnosis not present

## 2021-07-01 DIAGNOSIS — E538 Deficiency of other specified B group vitamins: Secondary | ICD-10-CM | POA: Diagnosis not present

## 2021-07-01 NOTE — Patient Instructions (Signed)
[place] at [facility] Discharge Instructions  You were seen and examined today by Dr. Delton Coombes. He reviewed your most recent labs and everything looks good except your Iron is low and your Vitamin B12 is high. He wants you to start taking iron twice daily and decrease the Vitamin B12 to 1000 mcg. Please keep follow up as scheduled.   Thank you for choosing [place] at [facility] to provide your oncology and hematology care.  To afford each patient quality time with our provider, please arrive at least 15 minutes before your scheduled appointment time.   If you have a lab appointment with the [facility] please come in thru the Main Entrance and check in at the main information desk.  You need to re-schedule your appointment should you arrive 10 or more minutes late.  We strive to give you quality time with our providers, and arriving late affects you and other patients whose appointments are after yours.  Also, if you no show three or more times for appointments you may be dismissed from the clinic at the providers discretion.     Again, thank you for choosing [facility].  Our hope is that these requests will decrease the amount of time that you wait before being seen by our physicians.       _____________________________________________________________  Should you have questions after your visit to [facility], please contact our office at [phone] and follow the prompts.  Our office hours are 8:00 a.m. and 4:30 p.m. Monday - Friday.  Please note that voicemails left after 4:00 p.m. may not be returned until the following business day.  We are closed weekends and major holidays.  You do have access to a nurse 24-7, just call the main number to the clinic [phone] and do not press any options, hold on the line and a nurse will answer the phone.    For prescription refill requests, have your pharmacy contact  our office and allow 72 hours.    Due to Covid, you will need to wear a mask upon entering the hospital. If you do not have a mask, a mask will be given to you at the Main Entrance upon arrival. For doctor visits, patients may have 1 support person age 70 or older with them. For treatment visits, patients can not have anyone with them due to social distancing guidelines and our immunocompromised population.

## 2021-07-02 DIAGNOSIS — Z4789 Encounter for other orthopedic aftercare: Secondary | ICD-10-CM | POA: Diagnosis not present

## 2021-07-02 DIAGNOSIS — Z981 Arthrodesis status: Secondary | ICD-10-CM | POA: Diagnosis not present

## 2021-07-02 DIAGNOSIS — M545 Low back pain, unspecified: Secondary | ICD-10-CM | POA: Diagnosis not present

## 2021-07-02 DIAGNOSIS — G8929 Other chronic pain: Secondary | ICD-10-CM | POA: Diagnosis not present

## 2021-07-02 DIAGNOSIS — M4186 Other forms of scoliosis, lumbar region: Secondary | ICD-10-CM | POA: Diagnosis not present

## 2021-07-22 DIAGNOSIS — M5136 Other intervertebral disc degeneration, lumbar region: Secondary | ICD-10-CM | POA: Diagnosis not present

## 2021-07-22 DIAGNOSIS — M1991 Primary osteoarthritis, unspecified site: Secondary | ICD-10-CM | POA: Diagnosis not present

## 2021-07-22 DIAGNOSIS — G8929 Other chronic pain: Secondary | ICD-10-CM | POA: Diagnosis not present

## 2021-07-22 DIAGNOSIS — Z23 Encounter for immunization: Secondary | ICD-10-CM | POA: Diagnosis not present

## 2021-07-22 DIAGNOSIS — Z6823 Body mass index (BMI) 23.0-23.9, adult: Secondary | ICD-10-CM | POA: Diagnosis not present

## 2021-07-22 DIAGNOSIS — M48061 Spinal stenosis, lumbar region without neurogenic claudication: Secondary | ICD-10-CM | POA: Diagnosis not present

## 2021-07-22 DIAGNOSIS — L219 Seborrheic dermatitis, unspecified: Secondary | ICD-10-CM | POA: Diagnosis not present

## 2021-07-22 DIAGNOSIS — C3432 Malignant neoplasm of lower lobe, left bronchus or lung: Secondary | ICD-10-CM | POA: Diagnosis not present

## 2021-07-22 DIAGNOSIS — M4316 Spondylolisthesis, lumbar region: Secondary | ICD-10-CM | POA: Diagnosis not present

## 2021-08-19 DIAGNOSIS — E663 Overweight: Secondary | ICD-10-CM | POA: Diagnosis not present

## 2021-08-19 DIAGNOSIS — G894 Chronic pain syndrome: Secondary | ICD-10-CM | POA: Diagnosis not present

## 2021-08-19 DIAGNOSIS — Z6823 Body mass index (BMI) 23.0-23.9, adult: Secondary | ICD-10-CM | POA: Diagnosis not present

## 2021-08-19 DIAGNOSIS — I10 Essential (primary) hypertension: Secondary | ICD-10-CM | POA: Diagnosis not present

## 2021-08-19 DIAGNOSIS — J449 Chronic obstructive pulmonary disease, unspecified: Secondary | ICD-10-CM | POA: Diagnosis not present

## 2021-08-19 DIAGNOSIS — M48061 Spinal stenosis, lumbar region without neurogenic claudication: Secondary | ICD-10-CM | POA: Diagnosis not present

## 2021-08-19 DIAGNOSIS — J40 Bronchitis, not specified as acute or chronic: Secondary | ICD-10-CM | POA: Diagnosis not present

## 2021-09-06 DIAGNOSIS — E782 Mixed hyperlipidemia: Secondary | ICD-10-CM | POA: Diagnosis not present

## 2021-09-06 DIAGNOSIS — J449 Chronic obstructive pulmonary disease, unspecified: Secondary | ICD-10-CM | POA: Diagnosis not present

## 2021-09-06 DIAGNOSIS — I10 Essential (primary) hypertension: Secondary | ICD-10-CM | POA: Diagnosis not present

## 2021-10-01 ENCOUNTER — Inpatient Hospital Stay (HOSPITAL_COMMUNITY): Payer: Medicare Other | Attending: Hematology

## 2021-10-01 ENCOUNTER — Other Ambulatory Visit: Payer: Self-pay

## 2021-10-01 DIAGNOSIS — D509 Iron deficiency anemia, unspecified: Secondary | ICD-10-CM | POA: Diagnosis not present

## 2021-10-01 DIAGNOSIS — D513 Other dietary vitamin B12 deficiency anemia: Secondary | ICD-10-CM

## 2021-10-01 DIAGNOSIS — E538 Deficiency of other specified B group vitamins: Secondary | ICD-10-CM | POA: Diagnosis not present

## 2021-10-01 LAB — CBC WITH DIFFERENTIAL/PLATELET
Abs Immature Granulocytes: 0.05 10*3/uL (ref 0.00–0.07)
Basophils Absolute: 0.1 10*3/uL (ref 0.0–0.1)
Basophils Relative: 1 %
Eosinophils Absolute: 0.1 10*3/uL (ref 0.0–0.5)
Eosinophils Relative: 1 %
HCT: 41.6 % (ref 39.0–52.0)
Hemoglobin: 14.2 g/dL (ref 13.0–17.0)
Immature Granulocytes: 1 %
Lymphocytes Relative: 17 %
Lymphs Abs: 1.3 10*3/uL (ref 0.7–4.0)
MCH: 34.1 pg — ABNORMAL HIGH (ref 26.0–34.0)
MCHC: 34.1 g/dL (ref 30.0–36.0)
MCV: 100 fL (ref 80.0–100.0)
Monocytes Absolute: 0.5 10*3/uL (ref 0.1–1.0)
Monocytes Relative: 6 %
Neutro Abs: 5.9 10*3/uL (ref 1.7–7.7)
Neutrophils Relative %: 74 %
Platelets: 282 10*3/uL (ref 150–400)
RBC: 4.16 MIL/uL — ABNORMAL LOW (ref 4.22–5.81)
RDW: 12.5 % (ref 11.5–15.5)
WBC: 7.8 10*3/uL (ref 4.0–10.5)
nRBC: 0 % (ref 0.0–0.2)

## 2021-10-01 LAB — IRON AND TIBC
Iron: 99 ug/dL (ref 45–182)
Saturation Ratios: 27 % (ref 17.9–39.5)
TIBC: 373 ug/dL (ref 250–450)
UIBC: 274 ug/dL

## 2021-10-01 LAB — VITAMIN B12: Vitamin B-12: 1044 pg/mL — ABNORMAL HIGH (ref 180–914)

## 2021-10-01 LAB — FERRITIN: Ferritin: 49 ng/mL (ref 24–336)

## 2021-10-07 ENCOUNTER — Ambulatory Visit (HOSPITAL_COMMUNITY): Payer: Medicare Other | Admitting: Hematology

## 2021-10-11 ENCOUNTER — Inpatient Hospital Stay (HOSPITAL_COMMUNITY): Payer: Medicare Other | Attending: Physician Assistant | Admitting: Physician Assistant

## 2021-10-11 ENCOUNTER — Other Ambulatory Visit: Payer: Self-pay

## 2021-10-11 DIAGNOSIS — C3490 Malignant neoplasm of unspecified part of unspecified bronchus or lung: Secondary | ICD-10-CM | POA: Diagnosis not present

## 2021-10-11 DIAGNOSIS — Z87891 Personal history of nicotine dependence: Secondary | ICD-10-CM

## 2021-10-11 DIAGNOSIS — Z85118 Personal history of other malignant neoplasm of bronchus and lung: Secondary | ICD-10-CM | POA: Diagnosis not present

## 2021-10-11 DIAGNOSIS — E538 Deficiency of other specified B group vitamins: Secondary | ICD-10-CM

## 2021-10-11 DIAGNOSIS — D509 Iron deficiency anemia, unspecified: Secondary | ICD-10-CM | POA: Diagnosis not present

## 2021-10-11 NOTE — Progress Notes (Signed)
Virtual Visit via Telephone Note [facility]  I connected with Maurice Orozco  on 10/11/21 at  1:48 PM by telephone and verified that I am speaking with the correct person using two identifiers.  Location: Patient: Home Provider: [facility]   I discussed the limitations, risks, security and privacy concerns of performing an evaluation and management service by telephone and the availability of in person appointments. I also discussed with the patient that there may be a patient responsible charge related to this service. The patient expressed understanding and agreed to proceed.   HISTORY OF PRESENT ILLNESS: [name] [facility] follows at our clinic for normocytic anemia as well as history of bilateral lung cancers.  He was last seen in clinic by Dr. Delton Coombes (07/01/2021).  Regarding his history of lung cancer, he denies any new cough, change in his baseline breathing, hemoptysis, voice changes, or unintentional weight loss.    He continues to take iron tablet twice daily and 1 mg B12 tablet daily.  He denies any epistaxis, hematemesis, hematochezia, melena.  He has dark stools ever since he started taking iron.  He reports that his energy level is at baseline with some mild chronic fatigue.  He denies any pica, headaches, chest pain, dyspnea on exertion, lightheadedness, or syncope. He reports that he has 80% energy and 80% appetite.    OBSERVATIONS/OBJECTIVE: Review of Systems  Constitutional:  Positive for malaise/fatigue. Negative for chills, diaphoresis, fever and weight loss.  HENT:  Positive for congestion.   Respiratory:  Positive for cough (from sinus congestion). Negative for shortness of breath.   Cardiovascular:  Negative for chest pain and palpitations.  Gastrointestinal:  Negative for abdominal pain, blood in stool, melena, nausea and vomiting.  Neurological:  Positive for headaches. Negative for dizziness.    PHYSICAL EXAM (per limitations of  virtual telephone visit): The patient is alert and oriented x 3, exhibiting adequate mentation, good mood, and ability to speak in full sentences and execute sound judgement.   ASSESSMENT & PLAN: 1.  Normocytic anemia - Denies any bleeding per rectum.  However has dark stools which she attributes to taking iron supplement.   - Currently taking iron tablet twice daily - Most recent labs (10/01/2021): Anemia resolved with Hgb 14.2, ferritin improved at 49 with iron saturation 27% - PLAN: Continue iron tablet twice daily. - Repeat CBC and iron panel in 6 months.  If any significant drops, would consider further testing including stool cards and GI referral if appropriate.  2.  History of bilateral lung cancer - Stage I left lung adenocarcinoma, status post SBRT on 07/10/2014. - Status post right upper lobectomy in February 2012 for right lung cancer. - Last CT chest on 06/06/2020 reviewed by me shows bandlike area along the major fissure in the left lower lobe likely postradiation changes. - He continues to qualify for annual LDCT chest due to his > 20 pack-year history of smoking, quit in 2012 (<15 years ago) - He denies any new cough, change in his baseline breathing, hemoptysis, voice changes, or unintentional weight loss - PLAN: LDCT chest in 6 months.  RTC in 6 months.    3.  B12 deficiency - He is currently taking vitamin B12 cyanocobalamin 1 mg tablet daily - PLAN: Continue B12 tablet 1 mg daily.  Check B12 and methylmalonic acid in 6 months.   FOLLOW UP INSTRUCTIONS: LDCT chest in 6 months Labs in 6 months RTC after CT and labs  I discussed the assessment and treatment plan with the patient. The patient was provided an opportunity to ask questions and all were answered. The patient agreed with the plan and demonstrated an understanding of the instructions.   The patient was advised to call back or seek an in-person evaluation if the symptoms worsen or if the condition fails to  improve as anticipated.  I provided 13 minutes of non-face-to-face time during this encounter.   Harriett Rush, PA-C 10/11/21 2:03 PM

## 2021-10-14 DIAGNOSIS — Z1152 Encounter for screening for COVID-19: Secondary | ICD-10-CM | POA: Diagnosis not present

## 2021-10-14 DIAGNOSIS — Z20828 Contact with and (suspected) exposure to other viral communicable diseases: Secondary | ICD-10-CM | POA: Diagnosis not present

## 2021-10-15 DIAGNOSIS — C3490 Malignant neoplasm of unspecified part of unspecified bronchus or lung: Secondary | ICD-10-CM | POA: Diagnosis not present

## 2021-10-15 DIAGNOSIS — Z6823 Body mass index (BMI) 23.0-23.9, adult: Secondary | ICD-10-CM | POA: Diagnosis not present

## 2021-10-15 DIAGNOSIS — J40 Bronchitis, not specified as acute or chronic: Secondary | ICD-10-CM | POA: Diagnosis not present

## 2021-10-15 DIAGNOSIS — E663 Overweight: Secondary | ICD-10-CM | POA: Diagnosis not present

## 2021-10-15 DIAGNOSIS — E782 Mixed hyperlipidemia: Secondary | ICD-10-CM | POA: Diagnosis not present

## 2021-10-15 DIAGNOSIS — L219 Seborrheic dermatitis, unspecified: Secondary | ICD-10-CM | POA: Diagnosis not present

## 2021-10-15 DIAGNOSIS — C3432 Malignant neoplasm of lower lobe, left bronchus or lung: Secondary | ICD-10-CM | POA: Diagnosis not present

## 2021-10-15 DIAGNOSIS — G8929 Other chronic pain: Secondary | ICD-10-CM | POA: Diagnosis not present

## 2021-10-15 DIAGNOSIS — R911 Solitary pulmonary nodule: Secondary | ICD-10-CM | POA: Diagnosis not present

## 2021-11-08 DIAGNOSIS — Z20822 Contact with and (suspected) exposure to covid-19: Secondary | ICD-10-CM | POA: Diagnosis not present

## 2021-11-11 DIAGNOSIS — M4316 Spondylolisthesis, lumbar region: Secondary | ICD-10-CM | POA: Diagnosis not present

## 2021-11-11 DIAGNOSIS — M1991 Primary osteoarthritis, unspecified site: Secondary | ICD-10-CM | POA: Diagnosis not present

## 2021-11-11 DIAGNOSIS — Z6824 Body mass index (BMI) 24.0-24.9, adult: Secondary | ICD-10-CM | POA: Diagnosis not present

## 2021-11-11 DIAGNOSIS — C3490 Malignant neoplasm of unspecified part of unspecified bronchus or lung: Secondary | ICD-10-CM | POA: Diagnosis not present

## 2021-11-11 DIAGNOSIS — G8929 Other chronic pain: Secondary | ICD-10-CM | POA: Diagnosis not present

## 2021-12-09 DIAGNOSIS — M48061 Spinal stenosis, lumbar region without neurogenic claudication: Secondary | ICD-10-CM | POA: Diagnosis not present

## 2021-12-09 DIAGNOSIS — J449 Chronic obstructive pulmonary disease, unspecified: Secondary | ICD-10-CM | POA: Diagnosis not present

## 2021-12-09 DIAGNOSIS — M4316 Spondylolisthesis, lumbar region: Secondary | ICD-10-CM | POA: Diagnosis not present

## 2021-12-09 DIAGNOSIS — Z6822 Body mass index (BMI) 22.0-22.9, adult: Secondary | ICD-10-CM | POA: Diagnosis not present

## 2021-12-09 DIAGNOSIS — M5136 Other intervertebral disc degeneration, lumbar region: Secondary | ICD-10-CM | POA: Diagnosis not present

## 2021-12-09 DIAGNOSIS — I10 Essential (primary) hypertension: Secondary | ICD-10-CM | POA: Diagnosis not present

## 2021-12-26 DIAGNOSIS — Z20822 Contact with and (suspected) exposure to covid-19: Secondary | ICD-10-CM | POA: Diagnosis not present

## 2022-01-03 DIAGNOSIS — Z20822 Contact with and (suspected) exposure to covid-19: Secondary | ICD-10-CM | POA: Diagnosis not present

## 2022-01-09 DIAGNOSIS — Z20822 Contact with and (suspected) exposure to covid-19: Secondary | ICD-10-CM | POA: Diagnosis not present

## 2022-01-11 DIAGNOSIS — Z20822 Contact with and (suspected) exposure to covid-19: Secondary | ICD-10-CM | POA: Diagnosis not present

## 2022-01-14 DIAGNOSIS — Z20828 Contact with and (suspected) exposure to other viral communicable diseases: Secondary | ICD-10-CM | POA: Diagnosis not present

## 2022-01-15 DIAGNOSIS — Z20822 Contact with and (suspected) exposure to covid-19: Secondary | ICD-10-CM | POA: Diagnosis not present

## 2022-02-06 DIAGNOSIS — G894 Chronic pain syndrome: Secondary | ICD-10-CM | POA: Diagnosis not present

## 2022-02-06 DIAGNOSIS — M48061 Spinal stenosis, lumbar region without neurogenic claudication: Secondary | ICD-10-CM | POA: Diagnosis not present

## 2022-02-06 DIAGNOSIS — I10 Essential (primary) hypertension: Secondary | ICD-10-CM | POA: Diagnosis not present

## 2022-02-06 DIAGNOSIS — I700 Atherosclerosis of aorta: Secondary | ICD-10-CM | POA: Diagnosis not present

## 2022-02-06 DIAGNOSIS — M5136 Other intervertebral disc degeneration, lumbar region: Secondary | ICD-10-CM | POA: Diagnosis not present

## 2022-02-06 DIAGNOSIS — C3432 Malignant neoplasm of lower lobe, left bronchus or lung: Secondary | ICD-10-CM | POA: Diagnosis not present

## 2022-02-06 DIAGNOSIS — Z0001 Encounter for general adult medical examination with abnormal findings: Secondary | ICD-10-CM | POA: Diagnosis not present

## 2022-02-06 DIAGNOSIS — J449 Chronic obstructive pulmonary disease, unspecified: Secondary | ICD-10-CM | POA: Diagnosis not present

## 2022-02-06 DIAGNOSIS — Z6822 Body mass index (BMI) 22.0-22.9, adult: Secondary | ICD-10-CM | POA: Diagnosis not present

## 2022-02-06 DIAGNOSIS — Z1331 Encounter for screening for depression: Secondary | ICD-10-CM | POA: Diagnosis not present

## 2022-02-06 DIAGNOSIS — E782 Mixed hyperlipidemia: Secondary | ICD-10-CM | POA: Diagnosis not present

## 2022-02-07 DIAGNOSIS — E559 Vitamin D deficiency, unspecified: Secondary | ICD-10-CM | POA: Diagnosis not present

## 2022-02-07 DIAGNOSIS — J449 Chronic obstructive pulmonary disease, unspecified: Secondary | ICD-10-CM | POA: Diagnosis not present

## 2022-02-07 DIAGNOSIS — R7309 Other abnormal glucose: Secondary | ICD-10-CM | POA: Diagnosis not present

## 2022-02-07 DIAGNOSIS — E785 Hyperlipidemia, unspecified: Secondary | ICD-10-CM | POA: Diagnosis not present

## 2022-02-07 DIAGNOSIS — Z0001 Encounter for general adult medical examination with abnormal findings: Secondary | ICD-10-CM | POA: Diagnosis not present

## 2022-02-07 DIAGNOSIS — D519 Vitamin B12 deficiency anemia, unspecified: Secondary | ICD-10-CM | POA: Diagnosis not present

## 2022-02-07 DIAGNOSIS — Z125 Encounter for screening for malignant neoplasm of prostate: Secondary | ICD-10-CM | POA: Diagnosis not present

## 2022-02-13 DIAGNOSIS — D72829 Elevated white blood cell count, unspecified: Secondary | ICD-10-CM | POA: Diagnosis not present

## 2022-02-14 DIAGNOSIS — E876 Hypokalemia: Secondary | ICD-10-CM | POA: Diagnosis not present

## 2022-02-14 DIAGNOSIS — D72829 Elevated white blood cell count, unspecified: Secondary | ICD-10-CM | POA: Diagnosis not present

## 2022-02-14 DIAGNOSIS — E871 Hypo-osmolality and hyponatremia: Secondary | ICD-10-CM | POA: Diagnosis not present

## 2022-03-05 ENCOUNTER — Other Ambulatory Visit: Payer: Self-pay | Admitting: Internal Medicine

## 2022-03-05 ENCOUNTER — Other Ambulatory Visit (HOSPITAL_COMMUNITY): Payer: Self-pay | Admitting: Internal Medicine

## 2022-03-05 DIAGNOSIS — E261 Secondary hyperaldosteronism: Secondary | ICD-10-CM

## 2022-03-05 DIAGNOSIS — I10 Essential (primary) hypertension: Secondary | ICD-10-CM

## 2022-04-10 DIAGNOSIS — I10 Essential (primary) hypertension: Secondary | ICD-10-CM | POA: Diagnosis not present

## 2022-04-10 DIAGNOSIS — M1991 Primary osteoarthritis, unspecified site: Secondary | ICD-10-CM | POA: Diagnosis not present

## 2022-04-10 DIAGNOSIS — Z6822 Body mass index (BMI) 22.0-22.9, adult: Secondary | ICD-10-CM | POA: Diagnosis not present

## 2022-04-10 DIAGNOSIS — M4316 Spondylolisthesis, lumbar region: Secondary | ICD-10-CM | POA: Diagnosis not present

## 2022-04-10 DIAGNOSIS — M48062 Spinal stenosis, lumbar region with neurogenic claudication: Secondary | ICD-10-CM | POA: Diagnosis not present

## 2022-04-14 ENCOUNTER — Ambulatory Visit (HOSPITAL_COMMUNITY)
Admission: RE | Admit: 2022-04-14 | Discharge: 2022-04-14 | Disposition: A | Discharge status: Home / Self Care | Payer: Medicare Other | Source: Ambulatory Visit | Attending: Physician Assistant | Admitting: Physician Assistant

## 2022-04-14 ENCOUNTER — Inpatient Hospital Stay: Payer: Medicare Other | Attending: Physician Assistant

## 2022-04-14 ENCOUNTER — Ambulatory Visit (HOSPITAL_COMMUNITY)
Admission: RE | Admit: 2022-04-14 | Discharge: 2022-04-14 | Disposition: A | Discharge status: Home / Self Care | Payer: Medicare Other | Source: Ambulatory Visit | Attending: Internal Medicine | Admitting: Internal Medicine

## 2022-04-14 DIAGNOSIS — C3490 Malignant neoplasm of unspecified part of unspecified bronchus or lung: Secondary | ICD-10-CM

## 2022-04-14 DIAGNOSIS — D509 Iron deficiency anemia, unspecified: Secondary | ICD-10-CM

## 2022-04-14 DIAGNOSIS — Z85118 Personal history of other malignant neoplasm of bronchus and lung: Secondary | ICD-10-CM | POA: Insufficient documentation

## 2022-04-14 DIAGNOSIS — E261 Secondary hyperaldosteronism: Secondary | ICD-10-CM | POA: Insufficient documentation

## 2022-04-14 DIAGNOSIS — E538 Deficiency of other specified B group vitamins: Secondary | ICD-10-CM | POA: Insufficient documentation

## 2022-04-14 DIAGNOSIS — Z87891 Personal history of nicotine dependence: Secondary | ICD-10-CM | POA: Insufficient documentation

## 2022-04-14 DIAGNOSIS — I10 Essential (primary) hypertension: Secondary | ICD-10-CM | POA: Insufficient documentation

## 2022-04-14 DIAGNOSIS — D649 Anemia, unspecified: Secondary | ICD-10-CM | POA: Diagnosis not present

## 2022-04-14 DIAGNOSIS — I708 Atherosclerosis of other arteries: Secondary | ICD-10-CM | POA: Diagnosis not present

## 2022-04-14 DIAGNOSIS — Z79899 Other long term (current) drug therapy: Secondary | ICD-10-CM | POA: Diagnosis not present

## 2022-04-14 LAB — CBC WITH DIFFERENTIAL/PLATELET
Abs Immature Granulocytes: 0.06 10*3/uL (ref 0.00–0.07)
Basophils Absolute: 0 10*3/uL (ref 0.0–0.1)
Basophils Relative: 1 %
Eosinophils Absolute: 0.2 10*3/uL (ref 0.0–0.5)
Eosinophils Relative: 3 %
HCT: 38 % — ABNORMAL LOW (ref 39.0–52.0)
Hemoglobin: 12.6 g/dL — ABNORMAL LOW (ref 13.0–17.0)
Immature Granulocytes: 1 %
Lymphocytes Relative: 15 %
Lymphs Abs: 1.3 10*3/uL (ref 0.7–4.0)
MCH: 33 pg (ref 26.0–34.0)
MCHC: 33.2 g/dL (ref 30.0–36.0)
MCV: 99.5 fL (ref 80.0–100.0)
Monocytes Absolute: 0.8 10*3/uL (ref 0.1–1.0)
Monocytes Relative: 9 %
Neutro Abs: 6.3 10*3/uL (ref 1.7–7.7)
Neutrophils Relative %: 71 %
Platelets: 214 10*3/uL (ref 150–400)
RBC: 3.82 MIL/uL — ABNORMAL LOW (ref 4.22–5.81)
RDW: 12 % (ref 11.5–15.5)
WBC Morphology: REACTIVE
WBC: 8.7 10*3/uL (ref 4.0–10.5)
nRBC: 0 % (ref 0.0–0.2)

## 2022-04-14 LAB — POCT I-STAT CREATININE: Creatinine, Ser: 1 mg/dL (ref 0.61–1.24)

## 2022-04-14 LAB — IRON AND TIBC
Iron: 72 ug/dL (ref 45–182)
Saturation Ratios: 22 % (ref 17.9–39.5)
TIBC: 323 ug/dL (ref 250–450)
UIBC: 251 ug/dL

## 2022-04-14 LAB — FERRITIN: Ferritin: 65 ng/mL (ref 24–336)

## 2022-04-14 LAB — VITAMIN B12: Vitamin B-12: 468 pg/mL (ref 180–914)

## 2022-04-14 MED ORDER — IOHEXOL 350 MG/ML SOLN
100.0000 mL | Freq: Once | INTRAVENOUS | Status: AC | PRN
  Administered 2022-04-14: 100 mL via INTRAVENOUS

## 2022-04-16 LAB — METHYLMALONIC ACID, SERUM: Methylmalonic Acid, Quantitative: 231 nmol/L (ref 0–378)

## 2022-04-20 NOTE — Progress Notes (Signed)
[address] [place], [place] [address]   CLINIC:  Medical Oncology/Hematology  PCP:  [facility], [place] / [place] [address] [phone]   REASON FOR VISIT:  Follow-up for history of bilateral lung cancers, normocytic anemia, and B12 deficiency  PRIOR THERAPY: - Right upper lobectomy (February 2012) for right lung cancer - SBRT (November 2015) for stage I left adenocarcinoma  NGS Results: Not done  CURRENT THERAPY: Surveillance  BRIEF ONCOLOGIC HISTORY:  Oncology History  Adenocarcinoma of lung ([place])  09/30/2010 Cancer Staging   CT of chest showed spiculated lesion   10/15/2010 Surgery   Right upper lobectomy by Dr. Arlyce Dice   10/16/2010 Remission     10/24/2013 Imaging   CT Chest-  Enlarging subpleural nodule in the left lower lobe, now 9 mm, with distortion of the overlying pleura. Finding is concerning for a small bronchogenic carcinoma   01/23/2014 Imaging   CT Chest- Left lower lobe nodule has enlarged from 11/01/2012 and is worrisome for primary bronchogenic carcinoma.    07/04/2014 - 07/10/2014 Radiation Therapy   SBRT by Dr. Isidore Moos in 3 fractions.   07/05/2015 Imaging   perifissural nodule in LLL appears slightly smaller with parenchymal opacity attrib to XRT, likely inflamm pathcy airspace dz more inferiorly in LLL, airspace dz in RLL resolved. no metastatic disease   Primary cancer of left lower lobe of lung ([place])  06/18/2014 Initial Diagnosis   Left lower lobe nodule suspicious for primary lung carcinoma   07/04/2014 - 07/10/2014 Radiation Therapy   SBRT   10/12/2014 Imaging   No acute findings within the chest. Decrease in size of left lower lobe perifissural nodule compatible with response to therapy. No new or progressive disease identified within the chest.     CANCER STAGING: Cancer Staging  Adenocarcinoma of lung ([place]) Staging form: Lung, AJCC 7th Edition - Clinical: Stage IA (T1a, N0, M0) - Signed  by Baird Cancer, PA on 05/06/2011   INTERVAL HISTORY:  [name], a 76 y.o. male, returns for routine follow-up of his history of bilateral lung cancers and his normocytic anemia. Maurice Orozco was last evaluated via telemedicine visit by Tarri Abernethy PA-C on 10/11/2021.  At today's visit, he  reports feeling fairly well.  He denies any recent hospitalizations, surgeries, or changes in his  baseline health status.  He reports that for the past few months, he has been coughing more frequently, which she reports may or may not be related to increased sinus drainage.  He denies any hemoptysis, voice changes, difficulty breathing, or unintentional weight loss.  He reports some increased fatigue.  He continues to take iron tablet twice daily and 1 mg B12 tablet daily.  He reports isolated episode of epistaxis about 2 months ago that resolved in less than 5 minutes.  He denies any hematemesis, hematochezia, melena.  He has dark stools ever since he started taking iron. He denies any pica, headaches, chest pain, dyspnea on exertion, lightheadedness, or syncope.  He reports 70% energy and 100% appetite.  He is maintaining stable weight at this time.   REVIEW OF SYSTEMS:    Review of Systems  Constitutional:  Positive for fatigue. Negative for appetite change, chills, diaphoresis, fever and unexpected weight change.  HENT:   Negative for lump/mass and nosebleeds.   Eyes:  Negative for eye problems.  Respiratory:  Positive for cough and shortness of breath (COPD). Negative for hemoptysis.   Cardiovascular:  Negative for chest  pain, leg swelling and palpitations.  Gastrointestinal:  Negative for abdominal pain, blood in stool, constipation, diarrhea, nausea and vomiting.  Genitourinary:  Positive for frequency. Negative for hematuria.   Musculoskeletal:  Positive for back pain.  Skin: Negative.   Neurological:  Negative for dizziness, headaches and light-headedness.  Hematological:  Does not  bruise/bleed easily.    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 dense RCA calcification --Non-ischemic Myoview   History of radiation therapy 07/04/14, 07/06/14, 07/10/14   LLL lung nodule/54 Gy/3 fx- SBRT   Hypercholesterolemia 05/06/2011   Pneumonia due to Streptococcus    HX. POST OPERATIVELY   Stress fx pelvis    DUE TO AA IN 1970   Past Surgical History:  Procedure Laterality Date   BACK SURGERY  07/04/2017   fusion above previous surgery   BACK SURGERY  2011   metal plate l-4,L5   Cor[place]ry Calcium Score  03/2017   1043. Noted especially in RCA. Dense calcification, recommend Myoview over Cor CTA.   LIPOMA EXCISION N/A 10/14/2017   Procedure: EXCISION LIPOMA ON BACK;  Surgeon: Virl Cagey, MD;  Location: [facility];  Service: General;  Laterality: N/A;   LIPOMA EXCISION Left 11/19/2018   Procedure: MINOR EXCISION OF SEBACEOUS CYST NECK;  Surgeon: Virl Cagey, MD;  Location: [facility];  Service: General;  Laterality: Left;  pt knows to arrive at 7:00   LUNG LOBECTOMY  2012   RT [place]  04/2017   EF 60%. Hypertensive response to exercise. (6:21 min, 7.7 METs) No EKG changes. LOW RISK    SOCIAL HISTORY:  Social History   Socioeconomic History   Marital status: Married    Spouse name: Not on file   Number of children: 3   Years of education: Not on file   Highest education level: Not on file  Occupational History   Not on file  Tobacco Use   Smoking status: Former    Types: Cigarettes    Quit  date: 09/08/2010    Years since quitting: 11.6   Smokeless tobacco: Current    Types: Chew   Tobacco comments:    still chews tobacco, he tells me he hasn't in the past 2 days.   Vaping Use   Vaping Use: Never used  Substance and Sexual Activity   Alcohol use: No   Drug use: No   Sexual activity: Not Currently  Other Topics Concern   Not on file  Social History Narrative   Not on file   Social Determinants of Health   Financial Resource Strain: Not on file  Food Insecurity: Not on file  Transportation Needs: No Transportation Needs (07/13/2018)   PRAPARE - Hydrologist (Medical): No    Lack of Transportation (Non-Medical): No  Physical Activity: Not on file  Stress: Not on file  Social Connections: Not on file  Intimate Partner Violence: Not At Risk (07/13/2018)  Humiliation, Afraid, Rape, and Kick questionnaire    Fear of Current or Ex-Partner: No    Emotionally Abused: No    Physically Abused: No    Sexually Abused: No    FAMILY HISTORY:  Family History  Problem Relation Age of Onset   Hypertension Mother    Kidney disease Father    Heart attack Brother    Cancer Maternal Uncle        prostate cancer   Cancer Maternal Uncle        prostate cancer   Cancer Maternal Uncle        prostate cancer   Cancer Maternal Uncle        prostate cancer    CURRENT MEDICATIONS:  Current Outpatient Medications  Medication Sig Dispense Refill   albuterol (PROVENTIL HFA;VENTOLIN HFA) 108 (90 Base) MCG/ACT inhaler Inhale 2 puffs into the lungs every 4 (four) hours as needed for wheezing or shortness of breath. 2 Inhaler 3   albuterol (PROVENTIL) (2.5 MG/3ML) 0.083% nebulizer solution Take 3 mLs (2.5 mg total) by nebulization every 4 (four) hours as needed for wheezing or shortness of breath. 75 mL 2   amitriptyline (ELAVIL) 50 MG tablet      cholecalciferol (VITAMIN D3) 25 MCG (1000 UNIT) tablet Take 1,000 Units by mouth daily.     cimetidine  (TAGAMET) 200 MG tablet Take 400 mg by mouth daily as needed (for heartburn).     fluticasone (FLONASE) 50 MCG/ACT nasal spray Place 2 sprays into both nostrils daily as needed for allergies.      guaiFENesin (MUCINEX) 600 MG 12 hr tablet Take by mouth.     hydrochlorothiazide (HYDRODIURIL) 25 MG tablet Take 25 mg by mouth daily.     HYDROcodone-acetaminophen (NORCO) 10-325 MG tablet Take 1-2 tablets by mouth every 4 (four) hours as needed for moderate pain. 30 tablet 0   Iron Combinations (CHROMAGEN) capsule Take 1 capsule by mouth daily.     Menthol-Methyl Salicylate (MUSCLE RUB EX) Apply 1 application topically 4 (four) times daily as needed (for pain).     neomycin-polymyxin-hydrocortisone (CORTISPORIN) 3.5-10000-1 OTIC suspension Place 1 drop into both ears as needed.     Omega-3 Fatty Acids (FISH OIL) 1000 MG CAPS Take 1,000 mg by mouth daily.     ondansetron (ZOFRAN-ODT) 4 MG disintegrating tablet Take by mouth.     Oxycodone HCl 10 MG TABS Take by mouth.     pantoprazole (PROTONIX) 40 MG tablet Take 40 mg by mouth 2 (two) times daily.     polyethylene glycol powder (GLYCOLAX/MIRALAX) 17 GM/SCOOP powder Take by mouth.     rosuvastatin (CRESTOR) 40 MG tablet Take 1 tablet (40 mg total) by mouth daily. 90 tablet 3   triamcinolone cream (KENALOG) 0.5 %      vitamin B-12 (CYANOCOBALAMIN) 1000 MCG tablet Take 1,000 mcg by mouth daily.     No current facility-administered medications for this visit.    ALLERGIES:  Allergies  Allergen Reactions   Fentanyl Other (See Comments)    CAUSED HALLUCINATIONS/05-03-13 pt reports it was oral fentanyl that caused hallucinations CAUSED HALLUCINATIONS/05-03-13 pt reports it was oral fentanyl that caused hallucinations   Gabapentin Other (See Comments)    Bones throbbed, headaches, weakness   Aspirin Nausea Only    PHYSICAL EXAM:    Performance status (ECOG): 1 - Symptomatic but completely ambulatory  There were no vitals filed for this visit. Wt  Readings from Last 3 Encounters:  04/26/20 151 lb 10.8 oz (68.8  kg)  11/16/18 158 lb 9.6 oz (71.9 kg)  11/01/18 160 lb (72.6 kg)   Physical Exam Constitutional:      Appearance: Normal appearance.  HENT:     Head: Normocephalic and atraumatic.     Mouth/Throat:     Mouth: Mucous membranes are moist.  Eyes:     Extraocular Movements: Extraocular movements intact.     Pupils: Pupils are equal, round, and reactive to light.  Cardiovascular:     Rate and Rhythm: Normal rate and regular rhythm.     Pulses: Normal pulses.     Heart sounds: Normal heart sounds.  Pulmonary:     Effort: Pulmonary effort is normal.     Breath sounds: Decreased breath sounds present.     Comments: Coarse breath sounds and decreased air movement in all lung fields. Abdominal:     General: Bowel sounds are normal.     Palpations: Abdomen is soft.     Tenderness: There is no abdominal tenderness.  Musculoskeletal:        General: No swelling.     Right lower leg: No edema.     Left lower leg: No edema.  Lymphadenopathy:     Cervical: No cervical adenopathy.  Skin:    General: Skin is warm and dry.  Neurological:     General: No focal deficit present.     Mental Status: He is alert and oriented to person, place, and time.  Psychiatric:        Mood and Affect: Mood normal.        Behavior: Behavior normal.      LABORATORY DATA:  I have reviewed the labs as listed.     Latest Ref Rng & Units 04/14/2022    2:05 PM 10/01/2021    1:00 PM 06/28/2021    2:37 PM  CBC  WBC 4.0 - 10.5 K/uL 8.7  7.8  6.8   Hemoglobin 13.0 - 17.0 g/dL 12.6  14.2  12.9   Hematocrit 39.0 - 52.0 % 38.0  41.6  40.0   Platelets 150 - 400 K/uL 214  282  235       Latest Ref Rng & Units 04/14/2022    5:30 PM 12/20/2020    2:41 PM 05/29/2020    2:07 PM  CMP  Glucose 70 - 99 mg/dL  110  124   BUN 8 - 23 mg/dL  20  16   Creatinine 0.61 - 1.24 mg/dL 1.00  1.09  1.01   Sodium 135 - 145 mmol/L  135  134   Potassium 3.5 - 5.1  mmol/L  3.9  4.0   Chloride 98 - 111 mmol/L  97  98   CO2 22 - 32 mmol/L  27  29   Calcium 8.9 - 10.3 mg/dL  8.8  9.0   Total Protein 6.5 - 8.1 g/dL  7.3  6.6   Total Bilirubin 0.3 - 1.2 mg/dL  0.5  0.7   Alkaline Phos 38 - 126 U/L  57  48   AST 15 - 41 U/L  17  16   ALT 0 - 44 U/L  15  21     DIAGNOSTIC IMAGING:  I have independently reviewed the scans and discussed with the patient. CT CHEST LUNG CANCER SCREENING LOW DOSE WO CONTRAST  Result Date: 04/15/2022 CLINICAL DATA:  Non-small cell lung cancer, 20+ pack-year smoking history. * Tracking Code: BO * EXAM: CT CHEST WITHOUT CONTRAST LOW-DOSE FOR LUNG CANCER SCREENING TECHNIQUE:  Multidetector CT imaging of the chest was performed following the standard protocol without IV contrast. RADIATION DOSE REDUCTION: This exam was performed according to the departmental dose-optimization program which includes automated exposure control, adjustment of the mA and/or kV according to patient size and/or use of iterative reconstruction technique. COMPARISON:  06/06/2020. FINDINGS: Cardiovascular: Atherosclerotic calcification of the aorta, aortic valve and coronary arteries. Heart size normal. No pericardial effusion. Mediastinum/Nodes: No pathologically enlarged mediastinal or axillary lymph nodes. Hilar regions are difficult to definitively evaluate without IV contrast. Esophagus is slightly dilated and air-filled, suggesting dysmotility. Moderate hiatal hernia. Lungs/Pleura: Centrilobular and paraseptal emphysema. Right upper lobectomy. Post treatment volume loss and parenchymal retraction in the anterior left lower lobe, stable. Scarring in the lingula nodular soft tissue thickening in the posterior aspect of the left upper lobe has increased in size, now measuring 11.4 mm, previously 7 mm on 06/06/2020. No pleural fluid. Debris is seen in the airway. Upper Abdomen: Visualized portions of the liver, gallbladder, adrenal glands, kidneys, spleen and pancreas  are unremarkable. Moderate hiatal hernia. Musculoskeletal: Degenerative changes in the spine. No worrisome lytic or sclerotic lesions. IMPRESSION: 1. Lung-RADS 4B, suspicious. Additional imaging evaluation or consultation with Pulmonology or Thoracic Surgery recommended. Enlarging spiculated nodular soft tissue thickening in the posterior left upper lobe. These results will be called to the ordering clinician or representative by the Radiologist Assistant, and communication documented in the PACS or [employer]. 2. Right upper lobectomy.  Post treatment scarring in the left lung. 3. Moderate hiatal hernia. 4. Aortic atherosclerosis (ICD10-I70.0). Coronary artery calcification. 5.  Emphysema (ICD10-J43.9). Electronically Signed   By: Lorin Picket M.D.   On: 04/15/2022 13:24   CT ANGIO ABDOMEN W &/OR WO CONTRAST  Result Date: 04/15/2022 CLINICAL DATA:  Prior history of acute kidney injury. Now with normal renal function. EXAM: CT ANGIOGRAPHY ABDOMEN TECHNIQUE: Multidetector CT imaging of the abdomen was performed using the standard protocol during bolus administration of intravenous contrast. Multiplanar reconstructed images and MIPs were obtained and reviewed to evaluate the vascular anatomy. RADIATION DOSE REDUCTION: This exam was performed according to the departmental dose-optimization program which includes automated exposure control, adjustment of the mA and/or kV according to patient size and/or use of iterative reconstruction technique. CONTRAST:  131mL OMNIPAQUE IOHEXOL 350 MG/ML SOLN COMPARISON:  Renal ultrasound on 04/27/2020. CT of the chest on 06/06/2020 FINDINGS: VASCULAR Aorta: Diffuse atherosclerosis without evidence of aneurysm, stenosis or dissection. Celiac: Calcified plaque at the origin of the celiac axis causes non-critical stenosis of approximately 30-40%. Distal branch vessels are normally patent and demonstrate normal branching anatomy. SMA: Normally patent. Renals: Mild  atherosclerosis of bilateral single renal arteries without significant stenosis. No evidence by CTA underlying fibromuscular dysplasia. No evidence of renal artery aneurysms. IMA: Normally patent. Inflow: Diffuse calcified plaque in the common iliac arteries without high-grade stenosis. No aneurysmal disease. Veins: Venous phase imaging demonstrates normal patency of venous structures in the abdomen. Review of the MIP images confirms the above findings. NON-VASCULAR Lower chest: Suggestion potentially of new roughly 9 mm nodularity in the inferior lingula with adjacent chronic scarring. A lung cancer screening chest CT was also performed today and will be dictated separately. Hepatobiliary: Dependent density in the gallbladder likely represents calculi or biliary sludge. There is some biliary ductal dilatation with the common bile duct measuring up to 11-12 mm. Etiology of biliary ductal dilatation is not evident by CT. Pancreas: Unremarkable. No pancreatic ductal dilatation or surrounding inflammatory changes. Spleen: Normal in size without focal abnormality.  Adrenals/Urinary Tract: Adrenal glands are unremarkable. No hydronephrosis. Exophytic mid cortical cyst seen by prior renal ultrasound demonstrates some complex features by CT. Overall cystic lesion measures approximately 1.8 x 1.5 x 1.9 cm and there is suggestion some anterior mural nodularity measuring up to 7 mm in thickness. Unenhanced imaging is not available to determine degree of enhancement of potential mural nodularity and further follow-up and evaluation is recommended with MRI of the abdomen with and without contrast. No evidence of hydronephrosis. There are some small cysts of the left renal cortex that have a benign appearance. There is suggestion anterior bladder is unremarkable. Stomach/Bowel: Large hiatal hernia. Bowel shows no evidence of obstruction, ileus or inflammation. No free intraperitoneal air. No visible bowel lesion in the abdomen.  Lymphatic: No enlarged lymph nodes identified. Other: No abdominal wall hernia or abnormality. No visualized ascites. Musculoskeletal: Prior posterior lumbar fusion at multiple levels with hardware present. IMPRESSION: 1. Atherosclerosis of the abdominal aorta without evidence of aneurysm. 2. No evidence of renal artery stenosis. 3. Diffuse atherosclerosis of bilateral common iliac arteries without high-grade stenosis. 4. Suggestion of new 9 mm nodularity in the inferior lingula. See lung cancer screening chest CT which was performed today and will be dictated separately for recommendations. 5. Probable cholelithiasis. There is biliary dilatation with dilatation of the common bile duct up to 11-12 mm. Etiology of biliary dilatation is not evident by CT. Correlation suggested with liver function tests. 6. Complex cystic lesion of the left kidney measuring approximately 1.8 x 1.5 x 1.9 cm with suggestion of 7 mm peripheral mural nodularity. This is not fully characterized without unenhanced images to determine enhancement and follow-up evaluation is recommended with MRI of the abdomen with and without contrast. 7. Large hiatal hernia. Electronically Signed   By: Aletta Edouard M.D.   On: 04/15/2022 08:53     ASSESSMENT & PLAN:  1.  Normocytic anemia - Denies any bleeding per rectum.  However has dark stools which he attributes to taking iron supplement.    - Currently taking iron tablet twice daily   - Most recent labs (04/14/2022): Mild normocytic anemia with Hgb 12.6/MCV 99.5, ferritin 65, iron saturation 22% - PLAN: Continue iron tablet twice daily.  - We will consider checking stool cards if any significant drops in hemoglobin or iron - Repeat CBC and iron panel in 3 months    2.  History of bilateral lung cancer - Stage I left lung adenocarcinoma, status post SBRT on 07/10/2014. - Status post right upper lobectomy in February 2012 for right lung cancer. - Last CT chest on 06/06/2020 shows bandlike area  along the major fissure in the left lower lobe likely postradiation changes. - He continues to qualify for annual LDCT chest due to his > 20 pack-year history of smoking, quit in 2012 (<15 years ago) - LDCT chest for lung cancer screening (04/14/2022) was lung RADS 4B, suspicious: Enlarging spiculated nodular soft tissue thickening in the posterior left upper lobe (11.4 mm, previously 7 mm).  Additional findings include right upper lobectomy with posttreatment scarring in the left lung. - He reports some slightly increased cough, but he denies any change in his baseline breathing, hemoptysis, voice changes, or unintentional weight loss   - PLAN: Per discussion with Dr. Delton Coombes, we will check PET scan.  If nodule shows increased activity, we will refer to radiation oncology for SBRT.  If nodule is unremarkable, we will recheck CT chest in 6 months. - Office visit after PET scan to  discuss results   3.  B12 deficiency - He is currently taking vitamin B12 cyanocobalamin 1 mg tablet daily  - Most recent labs (04/14/2022) show vitamin B12 468 with methylmalonic acid normal - PLAN: Continue B12 tablet 1 mg every other day.  Check B12 and methylmalonic acid in 6 months.   4.  Cystic lesion of left kidney - CT abdomen/pelvis obtained by PCP on 04/14/2022 due to history of AKI showed complex cystic lesion of left kidney measuring approximately 1.8 x 1.5 x 1.9 cm, not fully characterized without unenhanced images, recommended MRI abdomen with and without contrast for further characterization  - PLAN: PCP to follow-up on results from CT abdomen/pelvis. - We will pay attention to area of concern on upcoming PET scan which has been obtained for patient's lung nodule.   PLAN SUMMARY & DISPOSITION: PET scan Office visit after PET scan Additional labs/office visit to be scheduled after PET scan/follow-up  All questions were answered. The patient knows to call the clinic with any problems, questions or  concerns.  Medical decision making: Moderate  Time spent on visit: I spent 20 minutes counseling the patient face to face. The total time spent in the appointment was 30 minutes and more than 50% was on counseling.   Harriett Rush, PA-C  04/21/2022 7:19 PM

## 2022-04-21 ENCOUNTER — Inpatient Hospital Stay (HOSPITAL_BASED_OUTPATIENT_CLINIC_OR_DEPARTMENT_OTHER): Payer: Medicare Other | Admitting: Physician Assistant

## 2022-04-21 VITALS — BP 153/70 | HR 73 | Temp 98.8°F | Resp 18 | Ht 65.0 in | Wt 146.3 lb

## 2022-04-21 DIAGNOSIS — I10 Essential (primary) hypertension: Secondary | ICD-10-CM | POA: Diagnosis not present

## 2022-04-21 DIAGNOSIS — Z79899 Other long term (current) drug therapy: Secondary | ICD-10-CM | POA: Diagnosis not present

## 2022-04-21 DIAGNOSIS — Z85118 Personal history of other malignant neoplasm of bronchus and lung: Secondary | ICD-10-CM | POA: Diagnosis not present

## 2022-04-21 DIAGNOSIS — E261 Secondary hyperaldosteronism: Secondary | ICD-10-CM | POA: Diagnosis not present

## 2022-04-21 DIAGNOSIS — C3490 Malignant neoplasm of unspecified part of unspecified bronchus or lung: Secondary | ICD-10-CM

## 2022-04-21 DIAGNOSIS — D649 Anemia, unspecified: Secondary | ICD-10-CM | POA: Diagnosis not present

## 2022-04-21 DIAGNOSIS — E538 Deficiency of other specified B group vitamins: Secondary | ICD-10-CM | POA: Diagnosis not present

## 2022-04-21 DIAGNOSIS — R911 Solitary pulmonary nodule: Secondary | ICD-10-CM

## 2022-04-21 DIAGNOSIS — D509 Iron deficiency anemia, unspecified: Secondary | ICD-10-CM | POA: Diagnosis not present

## 2022-04-21 NOTE — Patient Instructions (Signed)
[name] **   You were seen today by Tarri Abernethy PA-C for your anemia and your history of lung cancer.    HISTORY OF LUNG CANCER:  Your most recent CT scan showed an area of concern in your left upper chest which could represent a new lung cancer.   We will schedule you for PET SCAN and see you for follow-up after this test has been completed.  ANEMIA:  Your mild anemia is relatively stable at baseline. Continue iron tablet daily. Decrease your Vitamin B12 to 1,000 mcg every other day (or 500 mcg daily)  FOLLOW-UP APPOINTMENT: Office visit in about a month (1 week after PET scan)  ** Thank you for trusting me with your healthcare!  I strive to provide all of my patients with quality care at each visit.  If you receive a survey for this visit, I would be so grateful to you for taking the time to provide feedback.  Thank you in advance!  ~ [name]                   Dr. Derek Jack   &   Tarri Abernethy, PA-C   - - - - - - - - - - - - - - - - - -    Thank you for choosing [facility] to provide your oncology and hematology care.  To afford each patient quality time with our provider, please arrive at least 15 minutes before your scheduled appointment time.   If you have a lab appointment with the [facility] please come in thru the Main Entrance and check in at the main information desk.  You need to re-schedule your appointment should you arrive 10 or more minutes late.  We strive to give you quality time with our providers, and arriving late affects you and other patients whose appointments are after yours.  Also, if you no show three or more times for appointments you may be dismissed from the clinic at the providers discretion.     Again, thank you for choosing [facility].  Our hope is that these requests will decrease the amount of time  that you wait before being seen by our physicians.       _____________________________________________________________  Should you have questions after your visit to [facility], please contact our office at [phone] and follow the prompts.  Our office hours are 8:00 a.m. and 4:30 p.m. Monday - Friday.  Please note that voicemails left after 4:00 p.m. may not be returned until the following business day.  We are closed weekends and major holidays.  You do have access to a nurse 24-7, just call the main number to the clinic [phone] and do not press any options, hold on the line and a nurse will answer the phone.    For prescription refill requests, have your pharmacy contact our office and allow 72 hours.

## 2022-04-24 ENCOUNTER — Ambulatory Visit (HOSPITAL_COMMUNITY)
Admission: RE | Admit: 2022-04-24 | Discharge: 2022-04-24 | Disposition: A | Discharge status: Home / Self Care | Payer: Medicare Other | Source: Ambulatory Visit | Attending: Physician Assistant | Admitting: Physician Assistant

## 2022-04-24 DIAGNOSIS — R911 Solitary pulmonary nodule: Secondary | ICD-10-CM | POA: Diagnosis not present

## 2022-04-24 DIAGNOSIS — Z85118 Personal history of other malignant neoplasm of bronchus and lung: Secondary | ICD-10-CM | POA: Diagnosis not present

## 2022-04-24 MED ORDER — FLUDEOXYGLUCOSE F - 18 (FDG) INJECTION
7.6900 | Freq: Once | INTRAVENOUS | Status: AC | PRN
  Administered 2022-04-24: 7.69 via INTRAVENOUS

## 2022-04-28 ENCOUNTER — Inpatient Hospital Stay (HOSPITAL_BASED_OUTPATIENT_CLINIC_OR_DEPARTMENT_OTHER): Payer: Medicare Other | Admitting: Hematology

## 2022-04-28 VITALS — BP 134/67 | HR 91 | Temp 98.4°F | Resp 18 | Ht 66.0 in | Wt 146.0 lb

## 2022-04-28 DIAGNOSIS — C3432 Malignant neoplasm of lower lobe, left bronchus or lung: Secondary | ICD-10-CM | POA: Diagnosis not present

## 2022-04-28 DIAGNOSIS — D649 Anemia, unspecified: Secondary | ICD-10-CM | POA: Diagnosis not present

## 2022-04-28 DIAGNOSIS — Z85118 Personal history of other malignant neoplasm of bronchus and lung: Secondary | ICD-10-CM | POA: Diagnosis not present

## 2022-04-28 DIAGNOSIS — I10 Essential (primary) hypertension: Secondary | ICD-10-CM | POA: Diagnosis not present

## 2022-04-28 DIAGNOSIS — Z79899 Other long term (current) drug therapy: Secondary | ICD-10-CM | POA: Diagnosis not present

## 2022-04-28 DIAGNOSIS — E538 Deficiency of other specified B group vitamins: Secondary | ICD-10-CM | POA: Diagnosis not present

## 2022-04-28 DIAGNOSIS — E261 Secondary hyperaldosteronism: Secondary | ICD-10-CM | POA: Diagnosis not present

## 2022-04-28 NOTE — Patient Instructions (Addendum)
[place] at [facility] Discharge Instructions   You were seen and examined today by Dr. Delton Coombes.  He reviewed the results of your PET scan. The spot on the lung looks suspicious for cancer. We will need to refer you to a lung doctor to get a bronchoscopy and biopsy of this area. There is also an area on your left shoulder blade that is lighting up on the scan. If the biopsy of the lung comes back cancer, we will need to biopsy the shoulder blade as well to rule out cancer in this area.   We will see you back after the biopsy to review the results.    Thank you for choosing [place] at [facility] to provide your oncology and hematology care.  To afford each patient quality time with our provider, please arrive at least 15 minutes before your scheduled appointment time.   If you have a lab appointment with the [facility] please come in thru the Main Entrance and check in at the main information desk.  You need to re-schedule your appointment should you arrive 10 or more minutes late.  We strive to give you quality time with our providers, and arriving late affects you and other patients whose appointments are after yours.  Also, if you no show three or more times for appointments you may be dismissed from the clinic at the providers discretion.     Again, thank you for choosing [facility].  Our hope is that these requests will decrease the amount of time that you wait before being seen by our physicians.       _____________________________________________________________  Should you have questions after your visit to [facility], please contact our office at [phone] and follow the prompts.  Our office hours are 8:00 a.m. and 4:30 p.m. Monday - Friday.  Please note that voicemails left after 4:00 p.m. may not be returned until the following business day.  We are closed weekends and major holidays.  You do have  access to a nurse 24-7, just call the main number to the clinic [phone] and do not press any options, hold on the line and a nurse will answer the phone.    For prescription refill requests, have your pharmacy contact our office and allow 72 hours.    Due to Covid, you will need to wear a mask upon entering the hospital. If you do not have a mask, a mask will be given to you at the Main Entrance upon arrival. For doctor visits, patients may have 1 support person age 56 or older with them. For treatment visits, patients can not have anyone with them due to social distancing guidelines and our immunocompromised population.

## 2022-04-28 NOTE — Progress Notes (Signed)
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 Arlyce Dice   10/16/2010 Remission     10/24/2013 Imaging   CT Chest-  Enlarging subpleural nodule in the left lower lobe, now 9 mm, with distortion of the overlying pleura. Finding is concerning for a small bronchogenic carcinoma   01/23/2014 Imaging   CT Chest- Left lower lobe nodule has enlarged from 11/01/2012 and is worrisome for primary bronchogenic carcinoma.    07/04/2014 - 07/10/2014 Radiation Therapy   SBRT by Dr. Isidore Moos in 3 fractions.   07/05/2015 Imaging   perifissural nodule in LLL appears slightly smaller with parenchymal opacity attrib to XRT, likely inflamm pathcy airspace dz more inferiorly in LLL, airspace dz in RLL resolved. no metastatic disease   Primary cancer of left lower lobe of lung ([place])  06/18/2014 Initial Diagnosis   Left lower lobe nodule suspicious for primary lung carcinoma   07/04/2014 - 07/10/2014 Radiation Therapy   SBRT   10/12/2014 Imaging   No acute findings within the chest. Decrease in size of left lower lobe perifissural nodule compatible with response to therapy. No new or progressive disease identified within the chest.     CANCER STAGING:  Cancer Staging  Adenocarcinoma of lung ([place]) Staging form: Lung, AJCC 7th Edition - Clinical: Stage IA (T1a, N0, M0) -  Signed by Baird Cancer, PA on 05/06/2011   INTERVAL HISTORY:  [name], a 76 y.o. male, seen for follow-up of lung cancer, iron deficiency state.  Energy levels are 70%.  Chronic back pain 7 out of 10 intensity is stable.  No cough or hemoptysis noted.Marland Kitchen   REVIEW OF SYSTEMS:    Review of Systems  Constitutional:  Negative for appetite change, chills, diaphoresis, fatigue, fever and unexpected weight change.  HENT:   Negative for lump/mass and nosebleeds.   Eyes:  Negative for eye problems.  Respiratory:  Positive for cough and shortness of breath (COPD). Negative for hemoptysis.   Cardiovascular:  Negative for chest pain, leg swelling and palpitations.  Gastrointestinal:  Negative for abdominal pain, blood in stool, constipation, diarrhea, nausea and vomiting.  Genitourinary:  Positive for frequency. Negative for hematuria.   Musculoskeletal:  Negative for back pain.  Skin: Negative.   Neurological:  Negative for dizziness, headaches and light-headedness.  Hematological:  Does not bruise/bleed easily.    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 dense RCA calcification --Non-ischemic Myoview   History of radiation therapy 07/04/14, 07/06/14, 07/10/14   LLL lung nodule/54 Gy/3 fx- SBRT   Hypercholesterolemia 05/06/2011   Pneumonia due to Streptococcus    HX. POST OPERATIVELY   Stress fx pelvis    DUE TO AA IN 1970   Past Surgical History:  Procedure Laterality Date   BACK SURGERY  07/04/2017   fusion above previous  surgery   BACK SURGERY  2011   metal plate l-4,L5   Coroanry Calcium Score  03/2017   1043. Noted especially in RCA. Dense calcification, recommend Myoview over Cor CTA.   LIPOMA EXCISION N/A 10/14/2017   Procedure: EXCISION LIPOMA ON BACK;  Surgeon: Virl Cagey, MD;  Location: [facility];  Service: General;  Laterality: N/A;   LIPOMA EXCISION Left 11/19/2018   Procedure: MINOR EXCISION OF SEBACEOUS CYST NECK;  Surgeon: Virl Cagey, MD;  Location: [facility];  Service: General;  Laterality: Left;  pt knows to arrive at 7:00   LUNG LOBECTOMY  2012   RT [place]  04/2017   EF 60%. Hypertensive response to exercise. (6:21 min, 7.7 METs) No EKG changes. LOW RISK    SOCIAL HISTORY:  Social History   Socioeconomic History   Marital status: Married    Spouse name: Not on file   Number of children: 3   Years of education: Not on file   Highest education level: Not on file  Occupational History   Not on file  Tobacco Use   Smoking status: Former    Types: Cigarettes    Quit date: 09/08/2010    Years since quitting: 11.6   Smokeless tobacco: Current    Types: Chew   Tobacco comments:    still chews tobacco, he tells me he hasn't in the past 2 days.   Vaping Use   Vaping Use: Never used  Substance and Sexual Activity   Alcohol use: No   Drug use: No   Sexual activity: Not Currently  Other Topics Concern   Not on file  Social History Narrative   Not on file   Social Determinants of Health   Financial Resource Strain: Not on file  Food Insecurity: Not on file  Transportation Needs: No Transportation Needs (07/13/2018)   PRAPARE - Hydrologist (Medical): No    Lack of Transportation (Non-Medical): No  Physical Activity: Not on file  Stress: Not on file  Social Connections: Not on file  Intimate Partner Violence: Not At Risk (07/13/2018)   Humiliation, Afraid, Rape, and Kick questionnaire    Fear of Current or Ex-Partner: No     Emotionally Abused: No    Physically Abused: No    Sexually Abused: No    FAMILY HISTORY:  Family History  Problem Relation Age of Onset   Hypertension Mother    Kidney disease Father    Heart attack Brother    Cancer Maternal Uncle        prostate cancer   Cancer Maternal Uncle        prostate cancer   Cancer Maternal Uncle        prostate cancer   Cancer Maternal Uncle        prostate cancer    CURRENT MEDICATIONS:  Current Outpatient Medications  Medication Sig Dispense Refill   albuterol (PROVENTIL HFA;VENTOLIN HFA) 108 (90 Base) MCG/ACT inhaler Inhale 2 puffs into the  lungs every 4 (four) hours as needed for wheezing or shortness of breath. 2 Inhaler 3   cholecalciferol (VITAMIN D3) 25 MCG (1000 UNIT) tablet Take 1,000 Units by mouth daily.     cimetidine (TAGAMET) 200 MG tablet Take 400 mg by mouth daily as needed (for heartburn).     fluticasone (FLONASE) 50 MCG/ACT nasal spray Place 2 sprays into both nostrils daily as needed for allergies.      guaiFENesin (MUCINEX) 600 MG 12 hr tablet Take by mouth.     hydrochlorothiazide (HYDRODIURIL) 25 MG tablet Take 25 mg by mouth daily.     HYDROcodone-acetaminophen (NORCO) 10-325 MG tablet Take 1-2 tablets by mouth every 4 (four) hours as needed for moderate pain. 30 tablet 0   Iron Combinations (CHROMAGEN) capsule Take 1 capsule by mouth daily.     Menthol-Methyl Salicylate (MUSCLE RUB EX) Apply 1 application topically 4 (four) times daily as needed (for pain).     neomycin-polymyxin-hydrocortisone (CORTISPORIN) 3.5-10000-1 OTIC suspension Place 1 drop into both ears as needed.     Omega-3 Fatty Acids (FISH OIL) 1000 MG CAPS Take 1,000 mg by mouth daily.     ondansetron (ZOFRAN-ODT) 4 MG disintegrating tablet Take by mouth.     Oxycodone HCl 10 MG TABS Take by mouth.     pantoprazole (PROTONIX) 40 MG tablet Take 40 mg by mouth 2 (two) times daily.     polyethylene glycol powder (GLYCOLAX/MIRALAX) 17 GM/SCOOP powder Take by  mouth.     triamcinolone cream (KENALOG) 0.5 %      vitamin B-12 (CYANOCOBALAMIN) 1000 MCG tablet Take 1,000 mcg by mouth daily.     albuterol (PROVENTIL) (2.5 MG/3ML) 0.083% nebulizer solution Take 3 mLs (2.5 mg total) by nebulization every 4 (four) hours as needed for wheezing or shortness of breath. 75 mL 2   rosuvastatin (CRESTOR) 40 MG tablet Take 1 tablet (40 mg total) by mouth daily. 90 tablet 3   No current facility-administered medications for this visit.    ALLERGIES:  Allergies  Allergen Reactions   Fentanyl Other (See Comments)    CAUSED HALLUCINATIONS/05-03-13 pt reports it was oral fentanyl that caused hallucinations CAUSED HALLUCINATIONS/05-03-13 pt reports it was oral fentanyl that caused hallucinations   Gabapentin Other (See Comments)    Bones throbbed, headaches, weakness   Aspirin Nausea Only    PHYSICAL EXAM:    Performance status (ECOG): 1 - Symptomatic but completely ambulatory  Vitals:   04/28/22 1120  BP: 134/67  Pulse: 91  Resp: 18  Temp: 98.4 F (36.9 C)  SpO2: 99%   Wt Readings from Last 3 Encounters:  04/28/22 146 lb (66.2 kg)  04/21/22 146 lb 4.8 oz (66.4 kg)  04/26/20 151 lb 10.8 oz (68.8 kg)   Physical Exam Constitutional:      Appearance: Normal appearance.  HENT:     Head: Normocephalic and atraumatic.     Mouth/Throat:     Mouth: Mucous membranes are moist.  Eyes:     Extraocular Movements: Extraocular movements intact.     Pupils: Pupils are equal, round, and reactive to light.  Cardiovascular:     Rate and Rhythm: Normal rate and regular rhythm.     Pulses: Normal pulses.     Heart sounds: Normal heart sounds.  Pulmonary:     Effort: Pulmonary effort is normal.     Breath sounds: Decreased breath sounds present.     Comments: Coarse breath sounds and decreased air movement in all lung fields. Abdominal:  General: Bowel sounds are normal.     Palpations: Abdomen is soft.     Tenderness: There is no abdominal tenderness.   Musculoskeletal:        General: No swelling.     Right lower leg: No edema.     Left lower leg: No edema.  Lymphadenopathy:     Cervical: No cervical adenopathy.  Skin:    General: Skin is warm and dry.  Neurological:     General: No focal deficit present.     Mental Status: He is alert and oriented to person, place, and time.  Psychiatric:        Mood and Affect: Mood normal.        Behavior: Behavior normal.      LABORATORY DATA:  I have reviewed the labs as listed.     Latest Ref Rng & Units 04/14/2022    2:05 PM 10/01/2021    1:00 PM 06/28/2021    2:37 PM  CBC  WBC 4.0 - 10.5 K/uL 8.7  7.8  6.8   Hemoglobin 13.0 - 17.0 g/dL 12.6  14.2  12.9   Hematocrit 39.0 - 52.0 % 38.0  41.6  40.0   Platelets 150 - 400 K/uL 214  282  235       Latest Ref Rng & Units 04/14/2022    5:30 PM 12/20/2020    2:41 PM 05/29/2020    2:07 PM  CMP  Glucose 70 - 99 mg/dL  110  124   BUN 8 - 23 mg/dL  20  16   Creatinine 0.61 - 1.24 mg/dL 1.00  1.09  1.01   Sodium 135 - 145 mmol/L  135  134   Potassium 3.5 - 5.1 mmol/L  3.9  4.0   Chloride 98 - 111 mmol/L  97  98   CO2 22 - 32 mmol/L  27  29   Calcium 8.9 - 10.3 mg/dL  8.8  9.0   Total Protein 6.5 - 8.1 g/dL  7.3  6.6   Total Bilirubin 0.3 - 1.2 mg/dL  0.5  0.7   Alkaline Phos 38 - 126 U/L  57  48   AST 15 - 41 U/L  17  16   ALT 0 - 44 U/L  15  21     DIAGNOSTIC IMAGING:  I have independently reviewed the scans and discussed with the patient. NM PET Image Initial (PI) Skull Base To Thigh  Result Date: 04/28/2022 CLINICAL DATA:  Initial treatment strategy for pulmonary nodule. History of prior lung cancer. EXAM: NUCLEAR MEDICINE PET SKULL BASE TO THIGH TECHNIQUE: 7.69 mCi F-18 FDG was injected intravenously. Full-ring PET imaging was performed from the skull base to thigh after the radiotracer. CT data was obtained and used for attenuation correction and anatomic localization. Fasting blood glucose: 107 mg/dl COMPARISON:  CT April 14, 2022  FINDINGS: Mediastinal blood pool activity: SUV max 1.91 Liver activity: SUV max NA NECK: No hypermetabolic cervical adenopathy. Incidental CT findings: None. CHEST: Hypermetabolic spiculated left upper lobe pulmonary nodule measures 14 mm on image 18/7 with a max SUV of 3.62. Incidental CT findings: Aortic atherosclerosis. Prior right upper lobectomy. Posttreatment scarring in the left lung. Moderate-sized hiatal hernia. ABDOMEN/PELVIS: No abnormal hypermetabolic activity within the liver, pancreas, adrenal glands, or spleen. No hypermetabolic lymph nodes in the abdomen or pelvis. Incidental CT findings: Exophytic left renal lesion measures 18 mm on image 99/7 with an anterior focus of mural nodularity measuring 8 mm. Aortic atherosclerosis.  SKELETON: Hypermetabolic focus in the inferior aspect of the right scapula without discrete CT correlate demonstrates a max SUV of 5.71. Incidental CT findings: Lumbosacral fixation hardware. Multilevel degenerative changes spine. IMPRESSION: 1. Hypermetabolic spiculated left upper lobe pulmonary nodule is suspicious for primary pulmonary neoplasm. 2. Hypermetabolic focus in the inferior aspect of the right scapula without discrete CT correlate is suspicious for early osseous metastatic disease. 3. No hypermetabolic thoracic adenopathy or evidence of intra-abdominopelvic hypermetabolic metastatic disease. 4. Exophytic 18 mm left renal lesion demonstrates complexity with an anterior focus of mural nodularity measuring 8 mm suspicious for a cystic renal neoplasm recommend more definitive characterization by renal mass protocol MRI or abdominal CT. Electronically Signed   By: Dahlia Bailiff M.D.   On: 04/28/2022 11:58   CT CHEST LUNG CANCER SCREENING LOW DOSE WO CONTRAST  Result Date: 04/15/2022 CLINICAL DATA:  Non-small cell lung cancer, 20+ pack-year smoking history. * Tracking Code: BO * EXAM: CT CHEST WITHOUT CONTRAST LOW-DOSE FOR LUNG CANCER SCREENING TECHNIQUE:  Multidetector CT imaging of the chest was performed following the standard protocol without IV contrast. RADIATION DOSE REDUCTION: This exam was performed according to the departmental dose-optimization program which includes automated exposure control, adjustment of the mA and/or kV according to patient size and/or use of iterative reconstruction technique. COMPARISON:  06/06/2020. FINDINGS: Cardiovascular: Atherosclerotic calcification of the aorta, aortic valve and coronary arteries. Heart size normal. No pericardial effusion. Mediastinum/Nodes: No pathologically enlarged mediastinal or axillary lymph nodes. Hilar regions are difficult to definitively evaluate without IV contrast. Esophagus is slightly dilated and air-filled, suggesting dysmotility. Moderate hiatal hernia. Lungs/Pleura: Centrilobular and paraseptal emphysema. Right upper lobectomy. Post treatment volume loss and parenchymal retraction in the anterior left lower lobe, stable. Scarring in the lingula nodular soft tissue thickening in the posterior aspect of the left upper lobe has increased in size, now measuring 11.4 mm, previously 7 mm on 06/06/2020. No pleural fluid. Debris is seen in the airway. Upper Abdomen: Visualized portions of the liver, gallbladder, adrenal glands, kidneys, spleen and pancreas are unremarkable. Moderate hiatal hernia. Musculoskeletal: Degenerative changes in the spine. No worrisome lytic or sclerotic lesions. IMPRESSION: 1. Lung-RADS 4B, suspicious. Additional imaging evaluation or consultation with Pulmonology or Thoracic Surgery recommended. Enlarging spiculated nodular soft tissue thickening in the posterior left upper lobe. These results will be called to the ordering clinician or representative by the Radiologist Assistant, and communication documented in the PACS or [employer]. 2. Right upper lobectomy.  Post treatment scarring in the left lung. 3. Moderate hiatal hernia. 4. Aortic atherosclerosis  (ICD10-I70.0). Coronary artery calcification. 5.  Emphysema (ICD10-J43.9). Electronically Signed   By: Lorin Picket M.D.   On: 04/15/2022 13:24   CT ANGIO ABDOMEN W &/OR WO CONTRAST  Result Date: 04/15/2022 CLINICAL DATA:  Prior history of acute kidney injury. Now with normal renal function. EXAM: CT ANGIOGRAPHY ABDOMEN TECHNIQUE: Multidetector CT imaging of the abdomen was performed using the standard protocol during bolus administration of intravenous contrast. Multiplanar reconstructed images and MIPs were obtained and reviewed to evaluate the vascular anatomy. RADIATION DOSE REDUCTION: This exam was performed according to the departmental dose-optimization program which includes automated exposure control, adjustment of the mA and/or kV according to patient size and/or use of iterative reconstruction technique. CONTRAST:  114mL OMNIPAQUE IOHEXOL 350 MG/ML SOLN COMPARISON:  Renal ultrasound on 04/27/2020. CT of the chest on 06/06/2020 FINDINGS: VASCULAR Aorta: Diffuse atherosclerosis without evidence of aneurysm, stenosis or dissection. Celiac: Calcified plaque at the origin of the celiac axis  causes non-critical stenosis of approximately 30-40%. Distal branch vessels are normally patent and demonstrate normal branching anatomy. SMA: Normally patent. Renals: Mild atherosclerosis of bilateral single renal arteries without significant stenosis. No evidence by CTA underlying fibromuscular dysplasia. No evidence of renal artery aneurysms. IMA: Normally patent. Inflow: Diffuse calcified plaque in the common iliac arteries without high-grade stenosis. No aneurysmal disease. Veins: Venous phase imaging demonstrates normal patency of venous structures in the abdomen. Review of the MIP images confirms the above findings. NON-VASCULAR Lower chest: Suggestion potentially of new roughly 9 mm nodularity in the inferior lingula with adjacent chronic scarring. A lung cancer screening chest CT was also performed today and  will be dictated separately. Hepatobiliary: Dependent density in the gallbladder likely represents calculi or biliary sludge. There is some biliary ductal dilatation with the common bile duct measuring up to 11-12 mm. Etiology of biliary ductal dilatation is not evident by CT. Pancreas: Unremarkable. No pancreatic ductal dilatation or surrounding inflammatory changes. Spleen: Normal in size without focal abnormality. Adrenals/Urinary Tract: Adrenal glands are unremarkable. No hydronephrosis. Exophytic mid cortical cyst seen by prior renal ultrasound demonstrates some complex features by CT. Overall cystic lesion measures approximately 1.8 x 1.5 x 1.9 cm and there is suggestion some anterior mural nodularity measuring up to 7 mm in thickness. Unenhanced imaging is not available to determine degree of enhancement of potential mural nodularity and further follow-up and evaluation is recommended with MRI of the abdomen with and without contrast. No evidence of hydronephrosis. There are some small cysts of the left renal cortex that have a benign appearance. There is suggestion anterior bladder is unremarkable. Stomach/Bowel: Large hiatal hernia. Bowel shows no evidence of obstruction, ileus or inflammation. No free intraperitoneal air. No visible bowel lesion in the abdomen. Lymphatic: No enlarged lymph nodes identified. Other: No abdominal wall hernia or abnormality. No visualized ascites. Musculoskeletal: Prior posterior lumbar fusion at multiple levels with hardware present. IMPRESSION: 1. Atherosclerosis of the abdominal aorta without evidence of aneurysm. 2. No evidence of renal artery stenosis. 3. Diffuse atherosclerosis of bilateral common iliac arteries without high-grade stenosis. 4. Suggestion of new 9 mm nodularity in the inferior lingula. See lung cancer screening chest CT which was performed today and will be dictated separately for recommendations. 5. Probable cholelithiasis. There is biliary dilatation  with dilatation of the common bile duct up to 11-12 mm. Etiology of biliary dilatation is not evident by CT. Correlation suggested with liver function tests. 6. Complex cystic lesion of the left kidney measuring approximately 1.8 x 1.5 x 1.9 cm with suggestion of 7 mm peripheral mural nodularity. This is not fully characterized without unenhanced images to determine enhancement and follow-up evaluation is recommended with MRI of the abdomen with and without contrast. 7. Large hiatal hernia. Electronically Signed   By: Aletta Edouard M.D.   On: 04/15/2022 08:53     ASSESSMENT & PLAN:  1.  Normocytic anemia - Continue iron tablet twice daily. - Labs on 04/14/2022 shows ferritin 65 and percent saturation 22.  Hemoglobin is 12.6.   2.  History of bilateral lung cancer: - Stage I left lung adenocarcinoma, status post SBRT on 07/10/2014. - Status post right upper lobectomy in February 2012 for right lung cancer. - LDCT chest (04/14/2022): Enlarging spiculated nodular soft tissue thickening in the posterior left upper lobe measuring 11.4 mm. - PET scan (04/24/2022): Left upper lobe lung nodule 14 mm with SUV 3.62.  Hypermetabolic focus in the inferior aspect of the right scapula without discrete  CT correlate with SUV 5.71.  No hypermetabolic adenopathy. - Recommend evaluation by pulmonary for bronchoscopy and biopsy.  We will make a referral to Drs. Icard/Byrum. - Will consider biopsy of the right scapular lesion if lung nodule is positive for malignancy.  There is no CT correlate on the scapular lesion even though it was PET positive. - RTC after the lung biopsy to discuss further plan.   3.  B12 deficiency - Continue B12 tablet every other day.  B12 level is 468.   4.  Cystic lesion of left kidney - CTA PE by PCP on 04/14/2022 showed complex cystic lesion of the left kidney measuring 1.8 x 1.5 x 1.9 cm. - He has an appointment to see Dr. Gerarda Fraction to follow-up on the CT.  We will consider ordering MRI with and  without contrast to further follow-up on this lesion if not ordered by Dr. Gerarda Fraction.   All questions were answered. The patient knows to call the clinic with any problems, questions or concerns.    Derek Jack, MD  04/21/2022 7:19 PM

## 2022-04-29 ENCOUNTER — Ambulatory Visit: Payer: Medicare Other | Admitting: Physician Assistant

## 2022-05-07 ENCOUNTER — Ambulatory Visit (INDEPENDENT_AMBULATORY_CARE_PROVIDER_SITE_OTHER): Payer: Medicare Other | Admitting: Urology

## 2022-05-07 ENCOUNTER — Encounter: Payer: Self-pay | Admitting: Urology

## 2022-05-07 VITALS — BP 116/78 | HR 99 | Wt 144.0 lb

## 2022-05-07 DIAGNOSIS — N281 Cyst of kidney, acquired: Secondary | ICD-10-CM

## 2022-05-07 LAB — URINALYSIS, ROUTINE W REFLEX MICROSCOPIC
Bilirubin, UA: NEGATIVE
Glucose, UA: NEGATIVE
Ketones, UA: NEGATIVE
Leukocytes,UA: NEGATIVE
Nitrite, UA: NEGATIVE
Protein,UA: NEGATIVE
RBC, UA: NEGATIVE
Specific Gravity, UA: 1.015 (ref 1.005–1.030)
Urobilinogen, Ur: 0.2 mg/dL (ref 0.2–1.0)
pH, UA: 7 (ref 5.0–7.5)

## 2022-05-07 NOTE — Progress Notes (Signed)
Assessment: 1. Complex renal cyst, left     Plan: I reviewed the patient's records as well as the imaging studies including the CT scan from 8/23 showing a complex left renal cyst with a possible mural nodule.  It is difficult to characterize the lesion as there are no precontrast images available. Recommend further evaluation with a MRI of the abdomen with and without contrast. Follow-up in the office after MRI completed.  Chief Complaint:  Chief Complaint  Patient presents with   renal cyst    History of Present Illness:  Maurice Orozco is a 76 y.o. male who is seen in consultation from [employer], MD for evaluation of a complex left renal cyst. Renal ultrasound from 8/21 showed a 1.6 x 1.5 cm left renal cyst. CT imaging from 04/15/2022 showed a complex cystic lesion in the left kidney measuring approximately 1.8 x 1.5 x 1.9 cm with suggestion of a 7 mm peripheral mural nodule, not fully characterized without unenhanced images. PET CT from 04/28/2022 done for evaluation of lung nodule and history of lung cancer showed an exophytic left renal lesion measuring 18 mm with an anterior focus of mural nodularity measuring 8 mm in size. No left flank pain.  No weight loss.  He reports a good appetite.  No dysuria or gross hematuria.  He has nocturia 0-3 times per night and occasional hesitancy.  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 dense RCA calcification --Non-ischemic Myoview   History of radiation therapy 07/04/14, 07/06/14, 07/10/14    LLL lung nodule/54 Gy/3 fx- SBRT   Hypercholesterolemia 05/06/2011   Pneumonia due to Streptococcus    HX. POST OPERATIVELY   Stress fx pelvis    DUE TO AA IN 1970    Past Surgical History:  Past Surgical History:  Procedure Laterality Date   BACK SURGERY  07/04/2017   fusion above previous surgery   BACK SURGERY  2011   metal plate l-4,L5   Coroanry Calcium Score  03/2017   1043. Noted especially in RCA. Dense calcification, recommend Myoview over Cor CTA.   LIPOMA EXCISION N/A 10/14/2017   Procedure: EXCISION LIPOMA ON BACK;  Surgeon: Virl Cagey, MD;  Location: [facility];  Service: General;  Laterality: N/A;   LIPOMA EXCISION Left 11/19/2018   Procedure: MINOR EXCISION OF SEBACEOUS CYST NECK;  Surgeon: Virl Cagey, MD;  Location: [facility];  Service: General;  Laterality: Left;  pt knows to arrive at 7:00   LUNG LOBECTOMY  2012   RT [place]  04/2017   EF 60%. Hypertensive response to exercise. (6:21 min, 7.7 METs) No EKG changes. LOW RISK    Allergies:  Allergies  Allergen Reactions   Fentanyl Other (See Comments)    CAUSED HALLUCINATIONS/05-03-13 pt reports it was oral fentanyl that caused hallucinations CAUSED HALLUCINATIONS/05-03-13 pt reports it was oral fentanyl that caused hallucinations   Gabapentin Other (See Comments)    Bones throbbed,  headaches, weakness   Aspirin Nausea Only    Family History:  Family History  Problem Relation Age of Onset   Hypertension Mother    Kidney disease Father    Heart attack Brother    Cancer Maternal Uncle        prostate cancer   Cancer Maternal Uncle        prostate cancer   Cancer Maternal Uncle        prostate cancer   Cancer Maternal Uncle        prostate cancer    Social History:  Social History   Tobacco Use   Smoking status: Former    Types: Cigarettes    Quit date: 09/08/2010    Years since quitting: 11.6   Smokeless tobacco: Current    Types: Chew   Tobacco comments:    still  chews tobacco, he tells me he hasn't in the past 2 days.   Vaping Use   Vaping Use: Never used  Substance Use Topics   Alcohol use: No   Drug use: No    Review of symptoms:  Constitutional:  Negative for unexplained weight loss, night sweats, fever, chills ENT:  Negative for nose bleeds, sinus pain, painful swallowing CV:  Negative for chest pain, shortness of breath, exercise intolerance, palpitations, loss of consciousness Resp:  Negative for cough, wheezing, shortness of breath GI:  Negative for nausea, vomiting, diarrhea, bloody stools GU:  Positives noted in HPI; otherwise negative for gross hematuria, dysuria, urinary incontinence Neuro:  Negative for seizures, poor balance, limb weakness, slurred speech Psych:  Negative for lack of energy, depression, anxiety Endocrine:  Negative for polydipsia, polyuria, symptoms of hypoglycemia (dizziness, hunger, sweating) Hematologic:  Negative for anemia, purpura, petechia, prolonged or excessive bleeding, use of anticoagulants  Allergic:  Negative for difficulty breathing or choking as a result of exposure to anything; no shellfish allergy; no allergic response (rash/itch) to materials, foods  Physical exam: BP 116/78   Pulse 99   Wt 144 lb (65.3 kg)   BMI 23.24 kg/m  GENERAL APPEARANCE:  Well appearing, well developed, well nourished, NAD HEENT: Atraumatic, Normocephalic, oropharynx clear. NECK: Supple without lymphadenopathy or thyromegaly. LUNGS: Clear to auscultation bilaterally. HEART: Regular Rate and Rhythm without murmurs, gallops, or rubs. ABDOMEN: Soft, non-tender, No Masses. EXTREMITIES: Moves all extremities well.  Without clubbing, cyanosis, or edema. NEUROLOGIC:  Alert and oriented x 3, normal gait, CN II-XII grossly intact.  MENTAL STATUS:  Appropriate. BACK:  Non-tender to palpation.  No CVAT SKIN:  Warm, dry and intact.    Results: U/A: Dipstick negative

## 2022-05-09 DIAGNOSIS — M4316 Spondylolisthesis, lumbar region: Secondary | ICD-10-CM | POA: Diagnosis not present

## 2022-05-09 DIAGNOSIS — M1991 Primary osteoarthritis, unspecified site: Secondary | ICD-10-CM | POA: Diagnosis not present

## 2022-05-09 DIAGNOSIS — Z6822 Body mass index (BMI) 22.0-22.9, adult: Secondary | ICD-10-CM | POA: Diagnosis not present

## 2022-05-09 DIAGNOSIS — M48062 Spinal stenosis, lumbar region with neurogenic claudication: Secondary | ICD-10-CM | POA: Diagnosis not present

## 2022-05-09 DIAGNOSIS — M5136 Other intervertebral disc degeneration, lumbar region: Secondary | ICD-10-CM | POA: Diagnosis not present

## 2022-05-09 DIAGNOSIS — I10 Essential (primary) hypertension: Secondary | ICD-10-CM | POA: Diagnosis not present

## 2022-05-09 DIAGNOSIS — J449 Chronic obstructive pulmonary disease, unspecified: Secondary | ICD-10-CM | POA: Diagnosis not present

## 2022-05-09 DIAGNOSIS — C3432 Malignant neoplasm of lower lobe, left bronchus or lung: Secondary | ICD-10-CM | POA: Diagnosis not present

## 2022-05-09 DIAGNOSIS — F5101 Primary insomnia: Secondary | ICD-10-CM | POA: Diagnosis not present

## 2022-05-16 ENCOUNTER — Ambulatory Visit (HOSPITAL_COMMUNITY)
Admission: RE | Admit: 2022-05-16 | Discharge: 2022-05-16 | Disposition: A | Discharge status: Home / Self Care | Payer: Medicare Other | Source: Ambulatory Visit | Attending: Urology | Admitting: Urology

## 2022-05-16 DIAGNOSIS — N281 Cyst of kidney, acquired: Secondary | ICD-10-CM | POA: Insufficient documentation

## 2022-05-16 DIAGNOSIS — K449 Diaphragmatic hernia without obstruction or gangrene: Secondary | ICD-10-CM | POA: Diagnosis not present

## 2022-05-16 MED ORDER — GADOBUTROL 1 MMOL/ML IV SOLN
7.0000 mL | Freq: Once | INTRAVENOUS | Status: AC | PRN
Start: 2022-05-16 — End: 2022-05-16
  Administered 2022-05-16: 7 mL via INTRAVENOUS

## 2022-05-19 ENCOUNTER — Encounter: Payer: Self-pay | Admitting: Pulmonary Disease

## 2022-05-19 ENCOUNTER — Ambulatory Visit (INDEPENDENT_AMBULATORY_CARE_PROVIDER_SITE_OTHER): Payer: Medicare Other | Admitting: Pulmonary Disease

## 2022-05-19 VITALS — BP 130/70 | HR 70 | Ht 66.0 in | Wt 145.6 lb

## 2022-05-19 DIAGNOSIS — R942 Abnormal results of pulmonary function studies: Secondary | ICD-10-CM

## 2022-05-19 DIAGNOSIS — R911 Solitary pulmonary nodule: Secondary | ICD-10-CM | POA: Diagnosis not present

## 2022-05-19 DIAGNOSIS — Z85118 Personal history of other malignant neoplasm of bronchus and lung: Secondary | ICD-10-CM | POA: Diagnosis not present

## 2022-05-19 NOTE — Patient Instructions (Signed)
Thank you for visiting Dr. Valeta Harms at [facility]. Today we recommend the following:  Orders Placed This Encounter  Procedures   Procedural/ Surgical Case Request: ROBOTIC ASSISTED NAVIGATIONAL BRONCHOSCOPY   CT Super D Chest Wo Contrast   Ambulatory referral to Pulmonology   Bronchoscopy on 06/03/2022  Return in about 22 days (around 06/10/2022) for with Eric Form, NP.    Please do your part to reduce the spread of COVID-19.

## 2022-05-19 NOTE — Progress Notes (Signed)
Synopsis: Referred in September 2023 for bronchoscopy evaluation by Derek Jack, MD  Subjective:   PATIENT ID: Maurice Orozco GENDER: male DOB: 23-Jan-1946, MRN: [id number]  Chief Complaint  Patient presents with   Consult    Lung nodule.    This is a 76 year old gentleman, history of adenocarcinoma of the lung with surgery in 2011.  Has a history of COPD, gastroesophageal reflux had a left lower lobe pulmonary nodule with 54 Gray SBRT.Patient had a nuclear medicine PET scan on 04/24/2022.  Patient was found to have a hypermetabolic spiculated left upper lobe pulmonary nodule concerning for neoplasm.  Also found to have a lesion in the right scapula concerning for early osseous metastatic disease.  Patient had a office visit with Dr. Delton Coombes on 04/28/2022 found to have these issues as well as known history of bilateral malignancy.  Patient has no complaints today.  We reviewed his CT/nuclear medicine pet imaging today in the office.    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 dense RCA calcification --Non-ischemic Myoview   History of radiation therapy 07/04/14, 07/06/14, 07/10/14   LLL lung nodule/54 Gy/3 fx- SBRT   Hypercholesterolemia 05/06/2011   Pneumonia due to Streptococcus    HX. POST OPERATIVELY   Stress fx pelvis    DUE TO AA IN 1970     Family History  Problem Relation Age of Onset   Hypertension Mother    Kidney disease Father    Heart attack Brother    Cancer Maternal Uncle        prostate cancer   Cancer Maternal Uncle        prostate  cancer   Cancer Maternal Uncle        prostate cancer   Cancer Maternal Uncle        prostate cancer     Past Surgical History:  Procedure Laterality Date   BACK SURGERY  07/04/2017   fusion above previous surgery   BACK SURGERY  2011   metal plate l-4,L5   Coroanry Calcium Score  03/2017   1043. Noted especially in RCA. Dense calcification, recommend Myoview over Cor CTA.   LIPOMA EXCISION N/A 10/14/2017   Procedure: EXCISION LIPOMA ON BACK;  Surgeon: Virl Cagey, MD;  Location: [facility];  Service: General;  Laterality: N/A;   LIPOMA EXCISION Left 11/19/2018   Procedure: MINOR EXCISION OF SEBACEOUS CYST NECK;  Surgeon: Virl Cagey, MD;  Location: [facility];  Service: General;  Laterality: Left;  pt knows to arrive at 7:00   LUNG LOBECTOMY  2012   RT [place]  04/2017   EF 60%. Hypertensive response to exercise. (6:21 min, 7.7 METs) No EKG changes. LOW RISK    Social History   Socioeconomic History   Marital status: Married    Spouse name: Not on file   Number of children: 3   Years of education: Not on file   Highest education level: Not on file  Occupational History   Not on file  Tobacco Use   Smoking status: Former    Types: Cigarettes    Quit date: 09/08/2010    Years since quitting: 11.7   Smokeless tobacco: Current    Types: Chew   Tobacco comments:    still chews tobacco, he tells me he hasn't in the past 2 days.   Vaping Use   Vaping Use: Never used  Substance and Sexual Activity   Alcohol use: No   Drug use: No   Sexual activity: Not Currently  Other Topics Concern   Not on file  Social History Narrative   Not on file   Social Determinants of Health   Financial Resource Strain: Not on file  Food Insecurity: Not on file  Transportation Needs: No Transportation Needs (07/13/2018)   PRAPARE - Hydrologist (Medical): No    Lack of Transportation (Non-Medical): No  Physical Activity: Not on file   Stress: Not on file  Social Connections: Not on file  Intimate Partner Violence: Not At Risk (07/13/2018)   Humiliation, Afraid, Rape, and Kick questionnaire    Fear of Current or Ex-Partner: No    Emotionally Abused: No    Physically Abused: No    Sexually Abused: No     Allergies  Allergen Reactions   Fentanyl Other (See Comments)    CAUSED HALLUCINATIONS/05-03-13 pt reports it was oral fentanyl that caused hallucinations CAUSED HALLUCINATIONS/05-03-13 pt reports it was oral fentanyl that caused hallucinations   Gabapentin Other (See Comments)    Bones throbbed, headaches, weakness   Aspirin Nausea Only     Outpatient Medications Prior to Visit  Medication Sig Dispense Refill   albuterol (PROVENTIL HFA;VENTOLIN HFA) 108 (90 Base) MCG/ACT inhaler Inhale 2 puffs into the lungs every 4 (four) hours as needed for wheezing or shortness of breath. 2 Inhaler 3   fluticasone (FLONASE) 50 MCG/ACT nasal spray Place 2 sprays into both nostrils daily as needed for allergies.      guaiFENesin (MUCINEX) 600 MG 12 hr tablet Take by mouth.     albuterol (PROVENTIL) (2.5 MG/3ML) 0.083% nebulizer solution Take 3 mLs (2.5 mg total) by nebulization every 4 (four) hours as needed for wheezing or shortness of breath. 75 mL 2   cholecalciferol (VITAMIN D3) 25 MCG (1000 UNIT) tablet Take 1,000 Units by mouth daily.     cimetidine (TAGAMET) 200 MG tablet Take 400 mg by mouth daily as needed (for heartburn).     hydrochlorothiazide (HYDRODIURIL) 25 MG tablet Take 25 mg by mouth daily.     HYDROcodone-acetaminophen (NORCO) 10-325 MG tablet Take 1-2 tablets by mouth every 4 (four) hours as needed for moderate pain. 30 tablet 0   Iron Combinations (CHROMAGEN) capsule Take 1 capsule by mouth daily.     Menthol-Methyl Salicylate (MUSCLE RUB EX) Apply 1 application topically 4 (four) times daily as needed (for pain).     neomycin-polymyxin-hydrocortisone (CORTISPORIN) 3.5-10000-1 OTIC suspension Place 1 drop into  both ears as needed.     Omega-3 Fatty Acids (FISH OIL) 1000 MG CAPS Take 1,000 mg by mouth daily.     ondansetron (ZOFRAN-ODT) 4 MG disintegrating tablet Take by mouth.     Oxycodone HCl 10 MG TABS Take by mouth.     pantoprazole (PROTONIX) 40 MG tablet Take 40 mg by mouth 2 (two) times daily.     polyethylene glycol powder (GLYCOLAX/MIRALAX) 17 GM/SCOOP powder Take by mouth.     rosuvastatin (CRESTOR)  40 MG tablet Take 1 tablet (40 mg total) by mouth daily. 90 tablet 3   triamcinolone cream (KENALOG) 0.5 %      vitamin B-12 (CYANOCOBALAMIN) 1000 MCG tablet Take 1,000 mcg by mouth daily.     No facility-administered medications prior to visit.    Review of Systems  Constitutional:  Negative for chills, fever, malaise/fatigue and weight loss.  HENT:  Negative for hearing loss, sore throat and tinnitus.   Eyes:  Negative for blurred vision and double vision.  Respiratory:  Negative for cough, hemoptysis, sputum production, shortness of breath, wheezing and stridor.   Cardiovascular:  Negative for chest pain, palpitations, orthopnea, leg swelling and PND.  Gastrointestinal:  Negative for abdominal pain, constipation, diarrhea, heartburn, nausea and vomiting.  Genitourinary:  Negative for dysuria, hematuria and urgency.  Musculoskeletal:  Negative for joint pain and myalgias.  Skin:  Negative for itching and rash.  Neurological:  Negative for dizziness, tingling, weakness and headaches.  Endo/Heme/Allergies:  Negative for environmental allergies. Does not bruise/bleed easily.  Psychiatric/Behavioral:  Negative for depression. The patient is not nervous/anxious and does not have insomnia.   All other systems reviewed and are negative.    Objective:  Physical Exam Vitals reviewed.  Constitutional:      General: He is not in acute distress.    Appearance: He is well-developed.  HENT:     Head: Normocephalic and atraumatic.  Eyes:     General: No scleral icterus.     Conjunctiva/sclera: Conjunctivae normal.     Pupils: Pupils are equal, round, and reactive to light.  Neck:     Vascular: No JVD.     Trachea: No tracheal deviation.  Cardiovascular:     Rate and Rhythm: Normal rate and regular rhythm.     Heart sounds: Normal heart sounds. No murmur heard. Pulmonary:     Effort: Pulmonary effort is normal. No tachypnea, accessory muscle usage or respiratory distress.     Breath sounds: No stridor. No wheezing, rhonchi or rales.  Abdominal:     General: There is no distension.     Palpations: Abdomen is soft.     Tenderness: There is no abdominal tenderness.  Musculoskeletal:        General: No tenderness.     Cervical back: Neck supple.  Lymphadenopathy:     Cervical: No cervical adenopathy.  Skin:    General: Skin is warm and dry.     Capillary Refill: Capillary refill takes less than 2 seconds.     Findings: No rash.  Neurological:     Mental Status: He is alert and oriented to person, place, and time.  Psychiatric:        Behavior: Behavior normal.      Vitals:   05/19/22 0948  BP: 130/70  Pulse: 70  SpO2: 96%  Weight: 145 lb 9.6 oz (66 kg)  Height: 5\' 6"  (1.676 m)   96% on RA BMI Readings from Last 3 Encounters:  05/19/22 23.50 kg/m  05/07/22 23.24 kg/m  04/28/22 23.57 kg/m   Wt Readings from Last 3 Encounters:  05/19/22 145 lb 9.6 oz (66 kg)  05/07/22 144 lb (65.3 kg)  04/28/22 146 lb (66.2 kg)     CBC    Component Value Date/Time   WBC 8.7 04/14/2022 1405   RBC 3.82 (L) 04/14/2022 1405   HGB 12.6 (L) 04/14/2022 1405   HCT 38.0 (L) 04/14/2022 1405   PLT 214 04/14/2022 1405   MCV 99.5 04/14/2022 1405  MCH 33.0 04/14/2022 1405   MCHC 33.2 04/14/2022 1405   RDW 12.0 04/14/2022 1405   LYMPHSABS 1.3 04/14/2022 1405   MONOABS 0.8 04/14/2022 1405   EOSABS 0.2 04/14/2022 1405   BASOSABS 0.0 04/14/2022 1405     Chest Imaging:  04/24/2022 nuclear medicine pet imaging: Hypermetabolic left upper lobe pulmonary  nodule concerning for primary malignancy.  Hypermetabolic focus in the right scapula concerning for early osseous metastatic disease. The patient's images have been independently reviewed by me.    Pulmonary Functions Testing Results:    Latest Ref Rng & Units 06/27/2014   10:35 AM  PFT Results  FVC-Pre L 3.23   FVC-Predicted Pre % 83   FVC-Post L 3.44   FVC-Predicted Post % 88   Pre FEV1/FVC % % 45   Post FEV1/FCV % % 46   FEV1-Pre L 1.44   FEV1-Predicted Pre % 50   FEV1-Post L 1.58   DLCO uncorrected ml/min/mmHg 21.57   DLCO UNC% % 80   DLCO corrected ml/min/mmHg 21.57   DLCO COR %Predicted % 80   DLVA Predicted % 85   TLC L 7.56   TLC % Predicted % 121   RV % Predicted % 170     FeNO:   Pathology:   Echocardiogram:   Heart Catheterization:     Assessment & Plan:     ICD-10-CM   1. Nodule of upper lobe of left lung  R91.1 Procedural/ Surgical Case Request: ROBOTIC ASSISTED NAVIGATIONAL BRONCHOSCOPY    Ambulatory referral to Pulmonology    CT Super D Chest Wo Contrast    2. Abnormal PET scan, lung  R94.2 Procedural/ Surgical Case Request: ROBOTIC ASSISTED NAVIGATIONAL BRONCHOSCOPY    Ambulatory referral to Pulmonology    CT Super D Chest Wo Contrast    3. History of lung cancer  Z85.118       Discussion:  This is a 76 year old gentleman, past medical history of bilateral malignancies, wedge resection in 2011 by Dr. Arlyce Dice on the right side, SBRT on the left.  Here today for follow-up after recent nuclear medicine PET scan reveals a hypermetabolic lesion in the posterior aspect of the left upper lobe concerning for malignancy.  Plan: Today in the office we talked about the risk benefits alternatives consideration for biopsy. Top of the risk of bleeding and pneumothorax. Patient is agreeable to proceed. Tentative bronchoscopy date will be on 06/03/2022. He is not on any blood thinners or antiplatelets. Pending pathology results we will consider oncology's  decision on biopsy of the hypermetabolic location in the scapula..    Current Outpatient Medications:    albuterol (PROVENTIL HFA;VENTOLIN HFA) 108 (90 Base) MCG/ACT inhaler, Inhale 2 puffs into the lungs every 4 (four) hours as needed for wheezing or shortness of breath., Disp: 2 Inhaler, Rfl: 3   fluticasone (FLONASE) 50 MCG/ACT nasal spray, Place 2 sprays into both nostrils daily as needed for allergies. , Disp: , Rfl:    guaiFENesin (MUCINEX) 600 MG 12 hr tablet, Take by mouth., Disp: , Rfl:    albuterol (PROVENTIL) (2.5 MG/3ML) 0.083% nebulizer solution, Take 3 mLs (2.5 mg total) by nebulization every 4 (four) hours as needed for wheezing or shortness of breath., Disp: 75 mL, Rfl: 2   cholecalciferol (VITAMIN D3) 25 MCG (1000 UNIT) tablet, Take 1,000 Units by mouth daily., Disp: , Rfl:    cimetidine (TAGAMET) 200 MG tablet, Take 400 mg by mouth daily as needed (for heartburn)., Disp: , Rfl:    hydrochlorothiazide (HYDRODIURIL)  25 MG tablet, Take 25 mg by mouth daily., Disp: , Rfl:    HYDROcodone-acetaminophen (NORCO) 10-325 MG tablet, Take 1-2 tablets by mouth every 4 (four) hours as needed for moderate pain., Disp: 30 tablet, Rfl: 0   Iron Combinations (CHROMAGEN) capsule, Take 1 capsule by mouth daily., Disp: , Rfl:    Menthol-Methyl Salicylate (MUSCLE RUB EX), Apply 1 application topically 4 (four) times daily as needed (for pain)., Disp: , Rfl:    neomycin-polymyxin-hydrocortisone (CORTISPORIN) 3.5-10000-1 OTIC suspension, Place 1 drop into both ears as needed., Disp: , Rfl:    Omega-3 Fatty Acids (FISH OIL) 1000 MG CAPS, Take 1,000 mg by mouth daily., Disp: , Rfl:    ondansetron (ZOFRAN-ODT) 4 MG disintegrating tablet, Take by mouth., Disp: , Rfl:    Oxycodone HCl 10 MG TABS, Take by mouth., Disp: , Rfl:    pantoprazole (PROTONIX) 40 MG tablet, Take 40 mg by mouth 2 (two) times daily., Disp: , Rfl:    polyethylene glycol powder (GLYCOLAX/MIRALAX) 17 GM/SCOOP powder, Take by mouth.,  Disp: , Rfl:    rosuvastatin (CRESTOR) 40 MG tablet, Take 1 tablet (40 mg total) by mouth daily., Disp: 90 tablet, Rfl: 3   triamcinolone cream (KENALOG) 0.5 %, , Disp: , Rfl:    vitamin B-12 (CYANOCOBALAMIN) 1000 MCG tablet, Take 1,000 mcg by mouth daily., Disp: , Rfl:   I spent 62 minutes dedicated to the care of this patient on the date of this encounter to include pre-visit review of records, face-to-face time with the patient discussing conditions above, post visit ordering of testing, clinical documentation with the electronic health record, making appropriate referrals as documented, and communicating necessary findings to members of the patients care team.   Garner Nash, DO [facility] 05/19/2022 10:01 AM

## 2022-05-19 NOTE — H&P (View-Only) (Signed)
Synopsis: Referred in September 2023 for bronchoscopy evaluation by Derek Jack, MD  Subjective:   PATIENT ID: Maurice Orozco GENDER: male DOB: Apr 25, 1946, MRN: [id number]  Chief Complaint  Patient presents with   Consult    Lung nodule.    This is a 76 year old gentleman, history of adenocarcinoma of the lung with surgery in 2011.  Has a history of COPD, gastroesophageal reflux had a left lower lobe pulmonary nodule with 54 Gray SBRT.Patient had a nuclear medicine PET scan on 04/24/2022.  Patient was found to have a hypermetabolic spiculated left upper lobe pulmonary nodule concerning for neoplasm.  Also found to have a lesion in the right scapula concerning for early osseous metastatic disease.  Patient had a office visit with Dr. Delton Coombes on 04/28/2022 found to have these issues as well as known history of bilateral malignancy.  Patient has no complaints today.  We reviewed his CT/nuclear medicine pet imaging today in the office.    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 dense RCA calcification --Non-ischemic Myoview   History of radiation therapy 07/04/14, 07/06/14, 07/10/14   LLL lung nodule/54 Gy/3 fx- SBRT   Hypercholesterolemia 05/06/2011   Pneumonia due to Streptococcus    HX. POST OPERATIVELY   Stress fx pelvis    DUE TO AA IN 1970     Family History  Problem Relation Age of Onset   Hypertension Mother    Kidney disease Father    Heart attack Brother    Cancer Maternal Uncle        prostate cancer   Cancer Maternal Uncle        prostate  cancer   Cancer Maternal Uncle        prostate cancer   Cancer Maternal Uncle        prostate cancer     Past Surgical History:  Procedure Laterality Date   BACK SURGERY  07/04/2017   fusion above previous surgery   BACK SURGERY  2011   metal plate l-4,L5   Coroanry Calcium Score  03/2017   1043. Noted especially in RCA. Dense calcification, recommend Myoview over Cor CTA.   LIPOMA EXCISION N/A 10/14/2017   Procedure: EXCISION LIPOMA ON BACK;  Surgeon: Virl Cagey, MD;  Location: [facility];  Service: General;  Laterality: N/A;   LIPOMA EXCISION Left 11/19/2018   Procedure: MINOR EXCISION OF SEBACEOUS CYST NECK;  Surgeon: Virl Cagey, MD;  Location: [facility];  Service: General;  Laterality: Left;  pt knows to arrive at 7:00   LUNG LOBECTOMY  2012   RT [place]  04/2017   EF 60%. Hypertensive response to exercise. (6:21 min, 7.7 METs) No EKG changes. LOW RISK    Social History   Socioeconomic History   Marital status: Married    Spouse name: Not on file   Number of children: 3   Years of education: Not on file   Highest education level: Not on file  Occupational History   Not on file  Tobacco Use   Smoking status: Former    Types: Cigarettes    Quit date: 09/08/2010    Years since quitting: 11.7   Smokeless tobacco: Current    Types: Chew   Tobacco comments:    still chews tobacco, he tells me he hasn't in the past 2 days.   Vaping Use   Vaping Use: Never used  Substance and Sexual Activity   Alcohol use: No   Drug use: No   Sexual activity: Not Currently  Other Topics Concern   Not on file  Social History Narrative   Not on file   Social Determinants of Health   Financial Resource Strain: Not on file  Food Insecurity: Not on file  Transportation Needs: No Transportation Needs (07/13/2018)   PRAPARE - Hydrologist (Medical): No    Lack of Transportation (Non-Medical): No  Physical Activity: Not on file   Stress: Not on file  Social Connections: Not on file  Intimate Partner Violence: Not At Risk (07/13/2018)   Humiliation, Afraid, Rape, and Kick questionnaire    Fear of Current or Ex-Partner: No    Emotionally Abused: No    Physically Abused: No    Sexually Abused: No     Allergies  Allergen Reactions   Fentanyl Other (See Comments)    CAUSED HALLUCINATIONS/05-03-13 pt reports it was oral fentanyl that caused hallucinations CAUSED HALLUCINATIONS/05-03-13 pt reports it was oral fentanyl that caused hallucinations   Gabapentin Other (See Comments)    Bones throbbed, headaches, weakness   Aspirin Nausea Only     Outpatient Medications Prior to Visit  Medication Sig Dispense Refill   albuterol (PROVENTIL HFA;VENTOLIN HFA) 108 (90 Base) MCG/ACT inhaler Inhale 2 puffs into the lungs every 4 (four) hours as needed for wheezing or shortness of breath. 2 Inhaler 3   fluticasone (FLONASE) 50 MCG/ACT nasal spray Place 2 sprays into both nostrils daily as needed for allergies.      guaiFENesin (MUCINEX) 600 MG 12 hr tablet Take by mouth.     albuterol (PROVENTIL) (2.5 MG/3ML) 0.083% nebulizer solution Take 3 mLs (2.5 mg total) by nebulization every 4 (four) hours as needed for wheezing or shortness of breath. 75 mL 2   cholecalciferol (VITAMIN D3) 25 MCG (1000 UNIT) tablet Take 1,000 Units by mouth daily.     cimetidine (TAGAMET) 200 MG tablet Take 400 mg by mouth daily as needed (for heartburn).     hydrochlorothiazide (HYDRODIURIL) 25 MG tablet Take 25 mg by mouth daily.     HYDROcodone-acetaminophen (NORCO) 10-325 MG tablet Take 1-2 tablets by mouth every 4 (four) hours as needed for moderate pain. 30 tablet 0   Iron Combinations (CHROMAGEN) capsule Take 1 capsule by mouth daily.     Menthol-Methyl Salicylate (MUSCLE RUB EX) Apply 1 application topically 4 (four) times daily as needed (for pain).     neomycin-polymyxin-hydrocortisone (CORTISPORIN) 3.5-10000-1 OTIC suspension Place 1 drop into  both ears as needed.     Omega-3 Fatty Acids (FISH OIL) 1000 MG CAPS Take 1,000 mg by mouth daily.     ondansetron (ZOFRAN-ODT) 4 MG disintegrating tablet Take by mouth.     Oxycodone HCl 10 MG TABS Take by mouth.     pantoprazole (PROTONIX) 40 MG tablet Take 40 mg by mouth 2 (two) times daily.     polyethylene glycol powder (GLYCOLAX/MIRALAX) 17 GM/SCOOP powder Take by mouth.     rosuvastatin (CRESTOR)  40 MG tablet Take 1 tablet (40 mg total) by mouth daily. 90 tablet 3   triamcinolone cream (KENALOG) 0.5 %      vitamin B-12 (CYANOCOBALAMIN) 1000 MCG tablet Take 1,000 mcg by mouth daily.     No facility-administered medications prior to visit.    Review of Systems  Constitutional:  Negative for chills, fever, malaise/fatigue and weight loss.  HENT:  Negative for hearing loss, sore throat and tinnitus.   Eyes:  Negative for blurred vision and double vision.  Respiratory:  Negative for cough, hemoptysis, sputum production, shortness of breath, wheezing and stridor.   Cardiovascular:  Negative for chest pain, palpitations, orthopnea, leg swelling and PND.  Gastrointestinal:  Negative for abdominal pain, constipation, diarrhea, heartburn, nausea and vomiting.  Genitourinary:  Negative for dysuria, hematuria and urgency.  Musculoskeletal:  Negative for joint pain and myalgias.  Skin:  Negative for itching and rash.  Neurological:  Negative for dizziness, tingling, weakness and headaches.  Endo/Heme/Allergies:  Negative for environmental allergies. Does not bruise/bleed easily.  Psychiatric/Behavioral:  Negative for depression. The patient is not nervous/anxious and does not have insomnia.   All other systems reviewed and are negative.    Objective:  Physical Exam Vitals reviewed.  Constitutional:      General: He is not in acute distress.    Appearance: He is well-developed.  HENT:     Head: Normocephalic and atraumatic.  Eyes:     General: No scleral icterus.     Conjunctiva/sclera: Conjunctivae normal.     Pupils: Pupils are equal, round, and reactive to light.  Neck:     Vascular: No JVD.     Trachea: No tracheal deviation.  Cardiovascular:     Rate and Rhythm: Normal rate and regular rhythm.     Heart sounds: Normal heart sounds. No murmur heard. Pulmonary:     Effort: Pulmonary effort is normal. No tachypnea, accessory muscle usage or respiratory distress.     Breath sounds: No stridor. No wheezing, rhonchi or rales.  Abdominal:     General: There is no distension.     Palpations: Abdomen is soft.     Tenderness: There is no abdominal tenderness.  Musculoskeletal:        General: No tenderness.     Cervical back: Neck supple.  Lymphadenopathy:     Cervical: No cervical adenopathy.  Skin:    General: Skin is warm and dry.     Capillary Refill: Capillary refill takes less than 2 seconds.     Findings: No rash.  Neurological:     Mental Status: He is alert and oriented to person, place, and time.  Psychiatric:        Behavior: Behavior normal.      Vitals:   05/19/22 0948  BP: 130/70  Pulse: 70  SpO2: 96%  Weight: 145 lb 9.6 oz (66 kg)  Height: 5\' 6"  (1.676 m)   96% on RA BMI Readings from Last 3 Encounters:  05/19/22 23.50 kg/m  05/07/22 23.24 kg/m  04/28/22 23.57 kg/m   Wt Readings from Last 3 Encounters:  05/19/22 145 lb 9.6 oz (66 kg)  05/07/22 144 lb (65.3 kg)  04/28/22 146 lb (66.2 kg)     CBC    Component Value Date/Time   WBC 8.7 04/14/2022 1405   RBC 3.82 (L) 04/14/2022 1405   HGB 12.6 (L) 04/14/2022 1405   HCT 38.0 (L) 04/14/2022 1405   PLT 214 04/14/2022 1405   MCV 99.5 04/14/2022 1405  MCH 33.0 04/14/2022 1405   MCHC 33.2 04/14/2022 1405   RDW 12.0 04/14/2022 1405   LYMPHSABS 1.3 04/14/2022 1405   MONOABS 0.8 04/14/2022 1405   EOSABS 0.2 04/14/2022 1405   BASOSABS 0.0 04/14/2022 1405     Chest Imaging:  04/24/2022 nuclear medicine pet imaging: Hypermetabolic left upper lobe pulmonary  nodule concerning for primary malignancy.  Hypermetabolic focus in the right scapula concerning for early osseous metastatic disease. The patient's images have been independently reviewed by me.    Pulmonary Functions Testing Results:    Latest Ref Rng & Units 06/27/2014   10:35 AM  PFT Results  FVC-Pre L 3.23   FVC-Predicted Pre % 83   FVC-Post L 3.44   FVC-Predicted Post % 88   Pre FEV1/FVC % % 45   Post FEV1/FCV % % 46   FEV1-Pre L 1.44   FEV1-Predicted Pre % 50   FEV1-Post L 1.58   DLCO uncorrected ml/min/mmHg 21.57   DLCO UNC% % 80   DLCO corrected ml/min/mmHg 21.57   DLCO COR %Predicted % 80   DLVA Predicted % 85   TLC L 7.56   TLC % Predicted % 121   RV % Predicted % 170     FeNO:   Pathology:   Echocardiogram:   Heart Catheterization:     Assessment & Plan:     ICD-10-CM   1. Nodule of upper lobe of left lung  R91.1 Procedural/ Surgical Case Request: ROBOTIC ASSISTED NAVIGATIONAL BRONCHOSCOPY    Ambulatory referral to Pulmonology    CT Super D Chest Wo Contrast    2. Abnormal PET scan, lung  R94.2 Procedural/ Surgical Case Request: ROBOTIC ASSISTED NAVIGATIONAL BRONCHOSCOPY    Ambulatory referral to Pulmonology    CT Super D Chest Wo Contrast    3. History of lung cancer  Z85.118       Discussion:  This is a 76 year old gentleman, past medical history of bilateral malignancies, wedge resection in 2011 by Dr. Arlyce Dice on the right side, SBRT on the left.  Here today for follow-up after recent nuclear medicine PET scan reveals a hypermetabolic lesion in the posterior aspect of the left upper lobe concerning for malignancy.  Plan: Today in the office we talked about the risk benefits alternatives consideration for biopsy. Top of the risk of bleeding and pneumothorax. Patient is agreeable to proceed. Tentative bronchoscopy date will be on 06/03/2022. He is not on any blood thinners or antiplatelets. Pending pathology results we will consider oncology's  decision on biopsy of the hypermetabolic location in the scapula..    Current Outpatient Medications:    albuterol (PROVENTIL HFA;VENTOLIN HFA) 108 (90 Base) MCG/ACT inhaler, Inhale 2 puffs into the lungs every 4 (four) hours as needed for wheezing or shortness of breath., Disp: 2 Inhaler, Rfl: 3   fluticasone (FLONASE) 50 MCG/ACT nasal spray, Place 2 sprays into both nostrils daily as needed for allergies. , Disp: , Rfl:    guaiFENesin (MUCINEX) 600 MG 12 hr tablet, Take by mouth., Disp: , Rfl:    albuterol (PROVENTIL) (2.5 MG/3ML) 0.083% nebulizer solution, Take 3 mLs (2.5 mg total) by nebulization every 4 (four) hours as needed for wheezing or shortness of breath., Disp: 75 mL, Rfl: 2   cholecalciferol (VITAMIN D3) 25 MCG (1000 UNIT) tablet, Take 1,000 Units by mouth daily., Disp: , Rfl:    cimetidine (TAGAMET) 200 MG tablet, Take 400 mg by mouth daily as needed (for heartburn)., Disp: , Rfl:    hydrochlorothiazide (HYDRODIURIL)  25 MG tablet, Take 25 mg by mouth daily., Disp: , Rfl:    HYDROcodone-acetaminophen (NORCO) 10-325 MG tablet, Take 1-2 tablets by mouth every 4 (four) hours as needed for moderate pain., Disp: 30 tablet, Rfl: 0   Iron Combinations (CHROMAGEN) capsule, Take 1 capsule by mouth daily., Disp: , Rfl:    Menthol-Methyl Salicylate (MUSCLE RUB EX), Apply 1 application topically 4 (four) times daily as needed (for pain)., Disp: , Rfl:    neomycin-polymyxin-hydrocortisone (CORTISPORIN) 3.5-10000-1 OTIC suspension, Place 1 drop into both ears as needed., Disp: , Rfl:    Omega-3 Fatty Acids (FISH OIL) 1000 MG CAPS, Take 1,000 mg by mouth daily., Disp: , Rfl:    ondansetron (ZOFRAN-ODT) 4 MG disintegrating tablet, Take by mouth., Disp: , Rfl:    Oxycodone HCl 10 MG TABS, Take by mouth., Disp: , Rfl:    pantoprazole (PROTONIX) 40 MG tablet, Take 40 mg by mouth 2 (two) times daily., Disp: , Rfl:    polyethylene glycol powder (GLYCOLAX/MIRALAX) 17 GM/SCOOP powder, Take by mouth.,  Disp: , Rfl:    rosuvastatin (CRESTOR) 40 MG tablet, Take 1 tablet (40 mg total) by mouth daily., Disp: 90 tablet, Rfl: 3   triamcinolone cream (KENALOG) 0.5 %, , Disp: , Rfl:    vitamin B-12 (CYANOCOBALAMIN) 1000 MCG tablet, Take 1,000 mcg by mouth daily., Disp: , Rfl:   I spent 62 minutes dedicated to the care of this patient on the date of this encounter to include pre-visit review of records, face-to-face time with the patient discussing conditions above, post visit ordering of testing, clinical documentation with the electronic health record, making appropriate referrals as documented, and communicating necessary findings to members of the patients care team.   Garner Nash, DO [facility] 05/19/2022 10:01 AM

## 2022-05-26 ENCOUNTER — Encounter: Payer: Self-pay | Admitting: Urology

## 2022-05-26 ENCOUNTER — Ambulatory Visit (INDEPENDENT_AMBULATORY_CARE_PROVIDER_SITE_OTHER): Payer: Medicare Other | Admitting: Urology

## 2022-05-26 VITALS — BP 154/78 | HR 86 | Ht 66.0 in | Wt 146.0 lb

## 2022-05-26 DIAGNOSIS — N281 Cyst of kidney, acquired: Secondary | ICD-10-CM

## 2022-05-26 LAB — URINALYSIS, ROUTINE W REFLEX MICROSCOPIC
Bilirubin, UA: NEGATIVE
Glucose, UA: NEGATIVE
Ketones, UA: NEGATIVE
Leukocytes,UA: NEGATIVE
Nitrite, UA: NEGATIVE
Protein,UA: NEGATIVE
RBC, UA: NEGATIVE
Specific Gravity, UA: 1.015 (ref 1.005–1.030)
Urobilinogen, Ur: 1 mg/dL (ref 0.2–1.0)
pH, UA: 6.5 (ref 5.0–7.5)

## 2022-05-26 NOTE — Progress Notes (Signed)
Assessment: 1. Complex renal cyst, left    Plan: I reviewed the MRI results. I discussed these results with radiology prior to the patient's visit, confirming that no worrisome features were noted for the left renal cysts. I discussed these results with the patient in detail. Recommend follow-up in 6 months with a renal ultrasound.  Chief Complaint:  Chief Complaint  Patient presents with   renal cyst    History of Present Illness:  Maurice Orozco is a 76 y.o. male who is seen for further evaluation of a complex left renal cyst. Renal ultrasound from 8/21 showed a 1.6 x 1.5 cm left renal cyst. CT imaging from 04/15/2022 showed a complex cystic lesion in the left kidney measuring approximately 1.8 x 1.5 x 1.9 cm with suggestion of a 7 mm peripheral mural nodule, not fully characterized without unenhanced images. PET CT from 04/28/2022 done for evaluation of lung nodule and history of lung cancer showed an exophytic left renal lesion measuring 18 mm with an anterior focus of mural nodularity measuring 8 mm in size. No left flank pain.  No weight loss.  He reports a good appetite.  No dysuria or gross hematuria.  He has nocturia 0-3 times per night and occasional hesitancy. MRI abdomen with and without contrast from 05/17/2022 showed small benign-appearing Bosniak 1 and 2 renal cysts in the left kidney.  No evidence of enhancement seen.  He returns today for follow-up.  He is not having any lower urinary tract symptoms.  No dysuria or gross hematuria.  No flank pain. IPSS = 2 today.  Portions of the above documentation were copied from a prior visit for review purposes only.   Past Medical History:  Past Medical History:  Diagnosis Date   Adenocarcinoma of lung ([place]) 05/06/2011   rt lung/dx 2011/surg only   Allergy    Anxiety    Arthritis    finger   Back fracture    DUE TO AA IN 1970   Chronic back pain    COPD (chronic obstructive pulmonary disease) with emphysema ([place])  05/06/2011   Essential hypertension 05/06/2011   GERD (gastroesophageal reflux disease)    High cholesterol    High coronary artery calcium score Julye 2018   Agaston Score 1043. dense RCA calcification --Non-ischemic Myoview   History of radiation therapy 07/04/14, 07/06/14, 07/10/14   LLL lung nodule/54 Gy/3 fx- SBRT   Hypercholesterolemia 05/06/2011   Pneumonia due to Streptococcus    HX. POST OPERATIVELY   Stress fx pelvis    DUE TO AA IN 1970    Past Surgical History:  Past Surgical History:  Procedure Laterality Date   BACK SURGERY  07/04/2017   fusion above previous surgery   BACK SURGERY  2011   metal plate l-4,L5   Coroanry Calcium Score  03/2017   1043. Noted especially in RCA. Dense calcification, recommend Myoview over Cor CTA.   LIPOMA EXCISION N/A 10/14/2017   Procedure: EXCISION LIPOMA ON BACK;  Surgeon: Virl Cagey, MD;  Location: [facility];  Service: General;  Laterality: N/A;   LIPOMA EXCISION Left 11/19/2018   Procedure: MINOR EXCISION OF SEBACEOUS CYST NECK;  Surgeon: Virl Cagey, MD;  Location: [facility];  Service: General;  Laterality: Left;  pt knows to arrive at 7:00   LUNG LOBECTOMY  2012   RT [place]  04/2017   EF 60%. Hypertensive response to exercise. (6:21 min, 7.7 METs) No EKG changes. LOW RISK  Allergies:  Allergies  Allergen Reactions   Fentanyl Other (See Comments)    CAUSED HALLUCINATIONS/05-03-13 pt reports it was oral fentanyl that caused hallucinations CAUSED HALLUCINATIONS/05-03-13 pt reports it was oral fentanyl that caused hallucinations   Gabapentin Other (See Comments)    Bones throbbed, headaches, weakness   Aspirin Nausea Only    Family History:  Family History  Problem Relation Age of Onset   Hypertension Mother    Kidney disease Father    Heart attack Brother    Cancer Maternal Uncle        prostate cancer   Cancer Maternal Uncle        prostate cancer   Cancer Maternal Uncle        prostate  cancer   Cancer Maternal Uncle        prostate cancer    Social History:  Social History   Tobacco Use   Smoking status: Former    Types: Cigarettes    Quit date: 09/08/2010    Years since quitting: 11.7   Smokeless tobacco: Current    Types: Chew   Tobacco comments:    still chews tobacco, he tells me he hasn't in the past 2 days.   Vaping Use   Vaping Use: Never used  Substance Use Topics   Alcohol use: No   Drug use: No    ROS: Constitutional:  Negative for fever, chills, weight loss CV: Negative for chest pain, previous MI, hypertension Respiratory:  Negative for shortness of breath, wheezing, sleep apnea, frequent cough GI:  Negative for nausea, vomiting, bloody stool, GERD  Physical exam: BP (!) 154/78   Pulse 86   Ht 5\' 6"  (1.676 m)   Wt 146 lb (66.2 kg)   BMI 23.57 kg/m  GENERAL APPEARANCE:  Well appearing, well developed, well nourished, NAD HEENT:  Atraumatic, normocephalic, oropharynx clear NECK:  Supple without lymphadenopathy or thyromegaly ABDOMEN:  Soft, non-tender, no masses EXTREMITIES:  Moves all extremities well, without clubbing, cyanosis, or edema NEUROLOGIC:  Alert and oriented x 3, normal gait, CN II-XII grossly intact MENTAL STATUS:  appropriate BACK:  Non-tender to palpation, No CVAT SKIN:  Warm, dry, and intact  Results: U/A: Dipstick negative

## 2022-05-30 ENCOUNTER — Other Ambulatory Visit: Payer: Medicare Other

## 2022-05-30 ENCOUNTER — Ambulatory Visit (HOSPITAL_COMMUNITY)
Admission: RE | Admit: 2022-05-30 | Discharge: 2022-05-30 | Disposition: A | Discharge status: Home / Self Care | Payer: Medicare Other | Source: Ambulatory Visit | Attending: Pulmonary Disease | Admitting: Pulmonary Disease

## 2022-05-30 DIAGNOSIS — R911 Solitary pulmonary nodule: Secondary | ICD-10-CM

## 2022-05-30 DIAGNOSIS — J439 Emphysema, unspecified: Secondary | ICD-10-CM | POA: Diagnosis not present

## 2022-05-30 DIAGNOSIS — R942 Abnormal results of pulmonary function studies: Secondary | ICD-10-CM | POA: Diagnosis not present

## 2022-06-01 LAB — SPECIMEN STATUS REPORT

## 2022-06-01 LAB — NOVEL CORONAVIRUS, NAA: SARS-CoV-2, NAA: NOT DETECTED

## 2022-06-02 ENCOUNTER — Other Ambulatory Visit: Payer: Self-pay

## 2022-06-02 ENCOUNTER — Encounter (HOSPITAL_COMMUNITY): Payer: Self-pay | Admitting: Pulmonary Disease

## 2022-06-02 NOTE — Progress Notes (Signed)
Spoke with pt's wife, Tandy Gaw for pre-op call. Pt was also in the room. Pt has hx of high coronary artery calcium score. Wife states he has never had stents or heart attack. She states pt is pre-diabetic. No A1C noted in Epic.   Shower instructions given to [place] and she voiced understanding.  Covid test done 05/30/22 and it's negative.

## 2022-06-03 ENCOUNTER — Encounter (HOSPITAL_COMMUNITY): Payer: Self-pay | Admitting: Pulmonary Disease

## 2022-06-03 ENCOUNTER — Encounter (HOSPITAL_COMMUNITY)
Admission: RE | Disposition: A | Discharge status: Home / Self Care | Payer: Self-pay | Source: Home / Self Care | Attending: Pulmonary Disease

## 2022-06-03 ENCOUNTER — Ambulatory Visit (HOSPITAL_BASED_OUTPATIENT_CLINIC_OR_DEPARTMENT_OTHER): Payer: Medicare Other | Admitting: Certified Registered"

## 2022-06-03 ENCOUNTER — Ambulatory Visit (HOSPITAL_COMMUNITY): Payer: Medicare Other

## 2022-06-03 ENCOUNTER — Other Ambulatory Visit: Payer: Self-pay

## 2022-06-03 ENCOUNTER — Ambulatory Visit (HOSPITAL_COMMUNITY)
Admission: RE | Admit: 2022-06-03 | Discharge: 2022-06-03 | Disposition: A | Discharge status: Home / Self Care | Payer: Medicare Other | Attending: Pulmonary Disease | Admitting: Pulmonary Disease

## 2022-06-03 ENCOUNTER — Ambulatory Visit (HOSPITAL_COMMUNITY): Payer: Medicare Other | Admitting: Certified Registered"

## 2022-06-03 DIAGNOSIS — J219 Acute bronchiolitis, unspecified: Secondary | ICD-10-CM | POA: Diagnosis not present

## 2022-06-03 DIAGNOSIS — J449 Chronic obstructive pulmonary disease, unspecified: Secondary | ICD-10-CM | POA: Insufficient documentation

## 2022-06-03 DIAGNOSIS — C3412 Malignant neoplasm of upper lobe, left bronchus or lung: Secondary | ICD-10-CM | POA: Insufficient documentation

## 2022-06-03 DIAGNOSIS — R911 Solitary pulmonary nodule: Secondary | ICD-10-CM | POA: Diagnosis not present

## 2022-06-03 DIAGNOSIS — I2510 Atherosclerotic heart disease of native coronary artery without angina pectoris: Secondary | ICD-10-CM | POA: Insufficient documentation

## 2022-06-03 DIAGNOSIS — Z902 Acquired absence of lung [part of]: Secondary | ICD-10-CM | POA: Insufficient documentation

## 2022-06-03 DIAGNOSIS — R942 Abnormal results of pulmonary function studies: Secondary | ICD-10-CM | POA: Insufficient documentation

## 2022-06-03 DIAGNOSIS — Z87891 Personal history of nicotine dependence: Secondary | ICD-10-CM

## 2022-06-03 DIAGNOSIS — I10 Essential (primary) hypertension: Secondary | ICD-10-CM

## 2022-06-03 DIAGNOSIS — K219 Gastro-esophageal reflux disease without esophagitis: Secondary | ICD-10-CM | POA: Insufficient documentation

## 2022-06-03 HISTORY — PX: BRONCHIAL BRUSHINGS: SHX5108

## 2022-06-03 HISTORY — DX: Calculus of gallbladder without cholecystitis without obstruction: K80.20

## 2022-06-03 HISTORY — PX: FIDUCIAL MARKER PLACEMENT: SHX6858

## 2022-06-03 HISTORY — PX: BRONCHIAL NEEDLE ASPIRATION BIOPSY: SHX5106

## 2022-06-03 HISTORY — DX: Personal history of urinary calculi: Z87.442

## 2022-06-03 HISTORY — PX: BRONCHIAL BIOPSY: SHX5109

## 2022-06-03 HISTORY — DX: Anemia, unspecified: D64.9

## 2022-06-03 HISTORY — DX: Prediabetes: R73.03

## 2022-06-03 LAB — BASIC METABOLIC PANEL
Anion gap: 12 (ref 5–15)
BUN: 8 mg/dL (ref 8–23)
CO2: 21 mmol/L — ABNORMAL LOW (ref 22–32)
Calcium: 9.2 mg/dL (ref 8.9–10.3)
Chloride: 102 mmol/L (ref 98–111)
Creatinine, Ser: 0.81 mg/dL (ref 0.61–1.24)
GFR, Estimated: >60 mL/min (ref >60)
Glucose, Bld: 133 mg/dL — ABNORMAL HIGH (ref 70–99)
Potassium: 3 mmol/L — ABNORMAL LOW (ref 3.5–5.1)
Sodium: 135 mmol/L (ref 135–145)

## 2022-06-03 LAB — CBC
HCT: 42 % (ref 39.0–52.0)
Hemoglobin: 14 g/dL (ref 13.0–17.0)
MCH: 33.7 pg (ref 26.0–34.0)
MCHC: 33.3 g/dL (ref 30.0–36.0)
MCV: 101.2 fL — ABNORMAL HIGH (ref 80.0–100.0)
Platelets: 245 10*3/uL (ref 150–400)
RBC: 4.15 MIL/uL — ABNORMAL LOW (ref 4.22–5.81)
RDW: 11.9 % (ref 11.5–15.5)
WBC: 10 10*3/uL (ref 4.0–10.5)
nRBC: 0 % (ref 0.0–0.2)

## 2022-06-03 SURGERY — BRONCHOSCOPY, WITH BIOPSY USING ELECTROMAGNETIC NAVIGATION
Anesthesia: General | Laterality: Left

## 2022-06-03 MED ORDER — PHENYLEPHRINE 80 MCG/ML (10ML) SYRINGE FOR IV PUSH (FOR BLOOD PRESSURE SUPPORT)
PREFILLED_SYRINGE | INTRAVENOUS | Status: DC | PRN
  Administered 2022-06-03: 80 ug via INTRAVENOUS
  Administered 2022-06-03: 160 ug via INTRAVENOUS

## 2022-06-03 MED ORDER — SUGAMMADEX SODIUM 200 MG/2ML IV SOLN
INTRAVENOUS | Status: DC | PRN
  Administered 2022-06-03: 200 mg via INTRAVENOUS

## 2022-06-03 MED ORDER — FENTANYL CITRATE (PF) 250 MCG/5ML IJ SOLN
INTRAMUSCULAR | Status: DC | PRN
  Administered 2022-06-03: 100 ug via INTRAVENOUS

## 2022-06-03 MED ORDER — MEPERIDINE HCL 25 MG/ML IJ SOLN: 6.2500 mg | INTRAMUSCULAR | Status: DC | PRN

## 2022-06-03 MED ORDER — FENTANYL CITRATE (PF) 100 MCG/2ML IJ SOLN: 25.0000 ug | INTRAMUSCULAR | Status: DC | PRN

## 2022-06-03 MED ORDER — PROPOFOL 10 MG/ML IV BOLUS
INTRAVENOUS | Status: DC | PRN
  Administered 2022-06-03: 130 mg via INTRAVENOUS

## 2022-06-03 MED ORDER — ROCURONIUM BROMIDE 10 MG/ML (PF) SYRINGE
PREFILLED_SYRINGE | INTRAVENOUS | Status: DC | PRN
  Administered 2022-06-03: 60 mg via INTRAVENOUS

## 2022-06-03 MED ORDER — OXYCODONE HCL 5 MG/5ML PO SOLN: 5.0000 mg | Freq: Once | ORAL | Status: DC | PRN

## 2022-06-03 MED ORDER — MIDAZOLAM HCL 2 MG/2ML IJ SOLN
INTRAMUSCULAR | Status: DC | PRN
  Administered 2022-06-03: 2 mg via INTRAVENOUS

## 2022-06-03 MED ORDER — HYDROCODONE-ACETAMINOPHEN 10-325 MG PO TABS
2.0000 | ORAL_TABLET | Freq: Once | ORAL | Status: AC
  Administered 2022-06-03: 2 via ORAL
  Filled 2022-06-03: qty 2

## 2022-06-03 MED ORDER — LIDOCAINE 2% (20 MG/ML) 5 ML SYRINGE
INTRAMUSCULAR | Status: DC | PRN
  Administered 2022-06-03: 50 mg via INTRAVENOUS

## 2022-06-03 MED ORDER — MIDAZOLAM HCL 2 MG/2ML IJ SOLN: 0.5000 mg | Freq: Once | INTRAMUSCULAR | Status: DC | PRN

## 2022-06-03 MED ORDER — ONDANSETRON HCL 4 MG/2ML IJ SOLN
INTRAMUSCULAR | Status: DC | PRN
  Administered 2022-06-03: 4 mg via INTRAVENOUS

## 2022-06-03 MED ORDER — PROMETHAZINE HCL 25 MG/ML IJ SOLN: 6.2500 mg | INTRAMUSCULAR | Status: DC | PRN

## 2022-06-03 MED ORDER — DEXAMETHASONE SODIUM PHOSPHATE 10 MG/ML IJ SOLN
INTRAMUSCULAR | Status: DC | PRN
  Administered 2022-06-03: 10 mg via INTRAVENOUS

## 2022-06-03 MED ORDER — OXYCODONE HCL 5 MG PO TABS: 5.0000 mg | ORAL_TABLET | Freq: Once | ORAL | Status: DC | PRN

## 2022-06-03 MED ORDER — LACTATED RINGERS IV SOLN: INTRAVENOUS | Status: DC

## 2022-06-03 MED ORDER — ACETAMINOPHEN 500 MG PO TABS: 1000.0000 mg | ORAL_TABLET | Freq: Once | ORAL | Status: DC

## 2022-06-03 SURGICAL SUPPLY — 1 items: superlock fiducial marker IMPLANT

## 2022-06-03 NOTE — Anesthesia Preprocedure Evaluation (Addendum)
Anesthesia Evaluation  Patient identified by MRN, date of birth, ID band Patient awake    Reviewed: Allergy & Precautions, NPO status , Patient's Chart, lab work & pertinent test results  History of Anesthesia Complications Negative for: history of anesthetic complications  Airway Mallampati: I  TM Distance: >3 FB Neck ROM: Full    Dental  (+) Edentulous Upper, Edentulous Lower   Pulmonary COPD,  COPD inhaler, former smoker,  H/o lung cancer: XRT, lobectomy   breath sounds clear to auscultation       Cardiovascular hypertension, Pt. on medications (-) angina+ CAD   Rhythm:Regular Rate:Normal  '18 Stress: Nuclear stress EF: 62%. Blood pressure demonstrated a hypertensive response to exercise. There was no ST segment deviation noted during stress. The study is normal. This is a low risk study    Neuro/Psych Anxiety Chronic back pain: narcotics    GI/Hepatic Neg liver ROS, GERD  Medicated and Controlled,  Endo/Other  negative endocrine ROS  Renal/GU Renal cyst     Musculoskeletal  (+) Arthritis ,   Abdominal   Peds  Hematology negative hematology ROS (+)   Anesthesia Other Findings   Reproductive/Obstetrics                            Anesthesia Physical Anesthesia Plan  ASA: 3  Anesthesia Plan: General   Post-op Pain Management: Tylenol PO (pre-op)*   Induction:   PONV Risk Score and Plan: 2 and Ondansetron  Airway Management Planned: Oral ETT  Additional Equipment: None  Intra-op Plan:   Post-operative Plan: Extubation in OR  Informed Consent: I have reviewed the patients History and Physical, chart, labs and discussed the procedure including the risks, benefits and alternatives for the proposed anesthesia with the patient or authorized representative who has indicated his/her understanding and acceptance.       Plan Discussed with: CRNA and Surgeon  Anesthesia  Plan Comments:        Anesthesia Quick Evaluation

## 2022-06-03 NOTE — Discharge Instructions (Signed)
Flexible Bronchoscopy, Care After This sheet gives you information about how to care for yourself after your test. Your doctor may also give you more specific instructions. If you have problems or questions, contact your doctor. Follow these instructions at home: Eating and drinking Do not eat or drink anything (not even water) for 2 hours after your test, or until your numbing medicine (local anesthetic) wears off. When your numbness is gone and your cough and gag reflexes have come back, you may: Eat only soft foods. Slowly drink liquids. The day after the test, go back to your normal diet. Driving Do not drive for 24 hours if you were given a medicine to help you relax (sedative). Do not drive or use heavy machinery while taking prescription pain medicine. General instructions  Take over-the-counter and prescription medicines only as told by your doctor. Return to your normal activities as told. Ask what activities are safe for you. Do not use any products that have nicotine or tobacco in them. This includes cigarettes and e-cigarettes. If you need help quitting, ask your doctor. Keep all follow-up visits as told by your doctor. This is important. It is very important if you had a tissue sample (biopsy) taken. Get help right away if: You have shortness of breath that gets worse. You get light-headed. You feel like you are going to pass out (faint). You have chest pain. You cough up: More than a little blood. More blood than before. Summary Do not eat or drink anything (not even water) for 2 hours after your test, or until your numbing medicine wears off. Do not use cigarettes. Do not use e-cigarettes. Get help right away if you have chest pain.  This information is not intended to replace advice given to you by your health care provider. Make sure you discuss any questions you have with your health care provider. Document Released: 06/22/2009 Document Revised: 08/07/2017 Document  Reviewed: 09/12/2016 Elsevier Patient Education  2020 [employer].

## 2022-06-03 NOTE — Op Note (Addendum)
Video Bronchoscopy with Robotic Assisted Bronchoscopic Navigation   Date of Operation: 06/03/2022   Pre-op Diagnosis: LUL lung nodule   Post-op Diagnosis: LUL lung nodule   Surgeon: Garner Nash, DO   Assistants: None   Anesthesia: General endotracheal anesthesia  Operation: Flexible video fiberoptic bronchoscopy with robotic assistance and biopsies.  Estimated Blood Loss: Minimal  Complications: None  Indications and History: Maurice Orozco is a 76 y.o. male with history of LUL lung nodule. The risks, benefits, complications, treatment options and expected outcomes were discussed with the patient.  The possibilities of pneumothorax, pneumonia, reaction to medication, pulmonary aspiration, perforation of a viscus, bleeding, failure to diagnose a condition and creating a complication requiring transfusion or operation were discussed with the patient who freely signed the consent.    Description of Procedure: The patient was seen in the Preoperative Area, was examined and was deemed appropriate to proceed.  The patient was taken to [facility] endoscopy room 3, identified as Maurice Orozco and the procedure verified as Flexible Video Fiberoptic Bronchoscopy.  A Time Out was held and the above information confirmed.   Prior to the date of the procedure a high-resolution CT scan of the chest was performed. Utilizing ION software program a virtual tracheobronchial tree was generated to allow the creation of distinct navigation pathways to the patient's parenchymal abnormalities. After being taken to the operating room general anesthesia was initiated and the patient  was orally intubated. The video fiberoptic bronchoscope was introduced via the endotracheal tube and a general inspection was performed which showed RML and RLL normal, visible suture line from RUL lobectomy, and left lung anatomy was normal. Additionally, there was an abnormal fold of mucosa on the distal right mainstem. The mucosa was  raised, red and appeared inflammed. Aspiration of the bilateral mainstems was completed to remove any remaining secretions. Robotic catheter inserted into patient's endotracheal tube.   Target #1 LUL nodule: The distinct navigation pathways prepared prior to this procedure were then utilized to navigate to patient's lesion identified on CT scan. The robotic catheter was secured into place and the vision probe was withdrawn.  Lesion location was approximated using fluoroscopy, 3D CBCT imaging and radial endobronchial ultrasound for peripheral targeting. Under fluoroscopic guidance, transbronchial needle biopsies, and transbronchial forceps biopsies were performed to be sent for cytology and pathology. Following tissue biopsy a single fiducial was placed using the fiducial catheter wire and a delivery kit.   Target #2 Right distal mainstem mucosa: Using a cytology brush the right distal mainstem was brushed for cytology. We used the 2.0 boston forceps to obtain endobronchial biopsies as well.   At the end of the procedure a general airway inspection was performed and there was no evidence of active bleeding. The bronchoscope was removed.  The patient tolerated the procedure well. There was no significant blood loss and there were no obvious complications. A post-procedural chest x-ray is pending.  Samples Target #1: 1. Transbronchial Wang needle biopsies from left upper lobe  2. Transbronchial forceps biopsies from left upper lobe   Samples Target #2: 1. Endobronchial cytology brushings right main stem  2. Endobronchial forcep biopsies right main stem  Plans:  The patient will be discharged from the PACU to home when recovered from anesthesia and after chest x-ray is reviewed. We will review the cytology, pathology results with the patient when they become available. Outpatient followup will be with Garner Nash, DO.  Garner Nash, DO [facility] 06/03/2022 11:16  AM

## 2022-06-03 NOTE — Anesthesia Postprocedure Evaluation (Signed)
Anesthesia Post Note  Patient: [place]  Procedure(s) Performed: ROBOTIC ASSISTED NAVIGATIONAL BRONCHOSCOPY (Left) BRONCHIAL BIOPSIES BRONCHIAL NEEDLE ASPIRATION BIOPSIES FIDUCIAL MARKER PLACEMENT BRONCHIAL BRUSHINGS     Patient location during evaluation: Phase II Anesthesia Type: General Level of consciousness: awake and alert, patient cooperative and oriented Pain management: pain level controlled Vital Signs Assessment: post-procedure vital signs reviewed and stable Respiratory status: spontaneous breathing, nonlabored ventilation and respiratory function stable Cardiovascular status: blood pressure returned to baseline and stable Postop Assessment: no apparent nausea or vomiting and able to ambulate Anesthetic complications: no   No notable events documented.  Last Vitals:  Vitals:   06/03/22 1146 06/03/22 1201  BP: 126/72 125/70  Pulse: 93 84  Resp: 16 10  Temp:  36.7 C  SpO2: 97% 96%    Last Pain:  Vitals:   06/03/22 1116  TempSrc:   PainSc: 0-No pain                 Kenesha Moshier,E. Pamella Samons

## 2022-06-03 NOTE — Transfer of Care (Signed)
Immediate Anesthesia Transfer of Care Note  Patient: Maurice Orozco  Procedure(s) Performed: ROBOTIC ASSISTED NAVIGATIONAL BRONCHOSCOPY (Left) BRONCHIAL BIOPSIES BRONCHIAL NEEDLE ASPIRATION BIOPSIES FIDUCIAL MARKER PLACEMENT BRONCHIAL BRUSHINGS  Patient Location: PACU  Anesthesia Type:General  Level of Consciousness: awake, alert  and oriented  Airway & Oxygen Therapy: Patient Spontanous Breathing  Post-op Assessment: Report given to RN and Post -op Vital signs reviewed and stable  Post vital signs: Reviewed and stable  Last Vitals:  Vitals Value Taken Time  BP 150/95   Temp    Pulse 89 06/03/22 1117  Resp 24 06/03/22 1117  SpO2 100 % 06/03/22 1117  Vitals shown include unvalidated device data.  Last Pain:  Vitals:   06/03/22 0927  TempSrc:   PainSc: 8          Complications: No notable events documented.

## 2022-06-03 NOTE — Anesthesia Procedure Notes (Signed)
Procedure Name: Intubation Date/Time: 06/03/2022 10:34 AM  Performed by: Anastasio Auerbach, CRNAPre-anesthesia Checklist: Patient identified, Emergency Drugs available, Suction available and Patient being monitored Patient Re-evaluated:Patient Re-evaluated prior to induction Oxygen Delivery Method: Circle system utilized Preoxygenation: Pre-oxygenation with 100% oxygen Induction Type: IV induction Ventilation: Mask ventilation without difficulty Laryngoscope Size: Mac and 3 Grade View: Grade I Tube type: Oral Number of attempts: 1 Airway Equipment and Method: Stylet and Oral airway Placement Confirmation: ETT inserted through vocal cords under direct vision, positive ETCO2 and breath sounds checked- equal and bilateral Secured at: 21 cm Tube secured with: Tape Dental Injury: Teeth and Oropharynx as per pre-operative assessment

## 2022-06-03 NOTE — Interval H&P Note (Signed)
History and Physical Interval Note:  06/03/2022 10:01 AM  Maurice Orozco  has presented today for surgery, with the diagnosis of lung nodule.  The various methods of treatment have been discussed with the patient and family. After consideration of risks, benefits and other options for treatment, the patient has consented to  Procedure(s) with comments: ROBOTIC ASSISTED NAVIGATIONAL BRONCHOSCOPY (Left) - ION w/ CIOS as a surgical intervention.  The patient's history has been reviewed, patient examined, no change in status, stable for surgery.  I have reviewed the patient's chart and labs.  Questions were answered to the patient's satisfaction.     [place]

## 2022-06-06 ENCOUNTER — Encounter (HOSPITAL_COMMUNITY): Payer: Self-pay | Admitting: Pulmonary Disease

## 2022-06-06 DIAGNOSIS — M5136 Other intervertebral disc degeneration, lumbar region: Secondary | ICD-10-CM | POA: Diagnosis not present

## 2022-06-06 DIAGNOSIS — M48062 Spinal stenosis, lumbar region with neurogenic claudication: Secondary | ICD-10-CM | POA: Diagnosis not present

## 2022-06-06 DIAGNOSIS — M1991 Primary osteoarthritis, unspecified site: Secondary | ICD-10-CM | POA: Diagnosis not present

## 2022-06-06 DIAGNOSIS — I10 Essential (primary) hypertension: Secondary | ICD-10-CM | POA: Diagnosis not present

## 2022-06-06 DIAGNOSIS — Z6823 Body mass index (BMI) 23.0-23.9, adult: Secondary | ICD-10-CM | POA: Diagnosis not present

## 2022-06-06 DIAGNOSIS — J449 Chronic obstructive pulmonary disease, unspecified: Secondary | ICD-10-CM | POA: Diagnosis not present

## 2022-06-06 LAB — CYTOLOGY - NON PAP

## 2022-06-10 ENCOUNTER — Ambulatory Visit (INDEPENDENT_AMBULATORY_CARE_PROVIDER_SITE_OTHER): Payer: Medicare Other | Admitting: Acute Care

## 2022-06-10 ENCOUNTER — Encounter: Payer: Self-pay | Admitting: Acute Care

## 2022-06-10 VITALS — BP 136/70 | HR 55 | Temp 98.4°F | Ht 66.0 in | Wt 145.4 lb

## 2022-06-10 DIAGNOSIS — C3492 Malignant neoplasm of unspecified part of left bronchus or lung: Secondary | ICD-10-CM | POA: Diagnosis not present

## 2022-06-10 DIAGNOSIS — Z85118 Personal history of other malignant neoplasm of bronchus and lung: Secondary | ICD-10-CM | POA: Diagnosis not present

## 2022-06-10 MED ORDER — BREZTRI AEROSPHERE 160-9-4.8 MCG/ACT IN AERO: 2.0000 | INHALATION_SPRAY | Freq: Two times a day (BID) | RESPIRATORY_TRACT | 0 refills | Status: DC

## 2022-06-10 NOTE — Patient Instructions (Addendum)
It is good to see you today. 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  The biopsy of the Right lung mainstem showed no malignant cells identified.  You have an appointment with Dr. Delton Coombes 06/16/2022. He will discuss the need to biopsy the area on your right shoulder blade. This will help determine your treatment path.  Work on quitting chewing tobacco.  We will do a therapeutic trial of Breztri \\Use  2 puffs in the morning and 2 puffs in the evening Rinse mouth after use. Let us know if you you would like [place] to send in a prescription for the medication. If it is not covered well by your insurance, we will give you paperwork to complete.  We will give you paperwork for financial aid for [facility] today. Complete this and return to the office of you like the medication and want a prescription.  Continue using rescue inhaler as needed for breakthrough shortness of breath or wheezing.  Follow up in 3 months with our office in [place] for COPD. Call us if you need [place] sooner. Please contact office for sooner follow up if symptoms do not improve or worsen or seek emergency care

## 2022-06-10 NOTE — Progress Notes (Signed)
History of Present Illness Maurice Orozco is a 76 y.o. male former smoker ( Quit with 2012 with an 88.5 pack year smoking history) , severe COPD and a history of bilateral lung cancers and normocytic anemia. He had Right upper lobectomy by Dr. Arlyce Dice in 09/2010, and SBRT by Dr. Isidore Moos 06/2014-07/2014 of the LLL. He was referred to see Dr. Valeta Harms   05/2022 for biopsy of hypermetabolic spiculated left upper lobe pulmonary nodule concerning for neoplasm found on surveillance CT Chest Follow up PET scan also showed   a lesion in the right scapula concerning for early osseous metastatic disease. He underwent Flexible video fiberoptic bronchoscopy with robotic assistance and biopsies on 06/03/2022.  06/10/2022 Pt. Presents for follow up after Flexible video fiberoptic bronchoscopy with robotic assistance and biopsies on 06/03/2022. He states he has been doing well. He did have some bleeding for 1-2 days after  the procedure that self resolved. He did have a sore throat the day after the procedure, but this also self resolved.  Patient is here with his wife today for follow-up after biopsy. We have discussed the results of his biopsies.  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  I explained that the biopsy taken from the right mainstem bronchus showed no malignant cells.  Based on the results of the biopsies plan is for follow-up with Dr. Delton Coombes in [place].  Patient will need biopsy of the right scapular lesion before determining next steps of care.  Patient is in agreement with this plan.  Both he and his wife verbalized understanding of the above.  Based on pulmonary function test done in 2015 patient does have severe obstructive disease.  He currently is not using any maintenance inhaler.  He does use an albuterol inhaler as needed for shortness of breath.  Per the patient's wife the patient has  been using his rescue inhaler more frequently.  He tries not to use it too much due to cost.  He tries not to use it too much due to cost . We will do a therapeutic trial of Breztri to see if he has benefit.We will also provide him with paperwork to complete for [place] and me if cost is an issue.   Patient states he  does cough up secretions in the morning. They are white or clear.  Test Results: CYTOLOGY - NON PAP  CASE: [id number]  PATIENT: Maurice Orozco  Non-Gynecological Cytology Report    FINAL MICROSCOPIC DIAGNOSIS:  A.  LEFT LUNG, UPPER LOBE, FINE NEEDLE ASPIRATION:  - Malignant  - Adenocarcinoma   COMMENT:   The smears and cell block show scattered loosely cohesive groups of  large atypical cells with a marked nuclear cytoplasmic ratio and  enlarged round to oval irregular hyperchromatic nuclei. Two  immunohistochemical stains are performed with adequate control.  The  neoplastic cells are positive for the pulmonary adeno marker TTF-1.  They are negative for the squamous marker p40.  The cytoistomorphology  and this immunohistochemical pattern support the above diagnosis.    FINAL MICROSCOPIC DIAGNOSIS:  B.  RIGHT LUNG, MAINSTEM BRONCHUS, BRUSH:  - No malignant cells identified  - Benign bronchial cells, pulmonary macrophages and acute inflammatory  cells   C.  RIGHT LUNG, MAINSTEM BRONCHUS, BIOPSY:  - No malignant cells identified  - Benign bronchial mucosa and bronchial epithelium    Chest Imaging:  04/24/2022 nuclear medicine pet imaging: Hypermetabolic left upper lobe pulmonary nodule concerning for primary malignancy.  Hypermetabolic focus in the right scapula concerning for early osseous metastatic disease.     Pulmonary Functions Testing Results:     Latest Ref Rng & Units 06/27/2014   10:35 AM  PFT Results  FVC-Pre L 3.23   FVC-Predicted Pre % 83   FVC-Post L 3.44   FVC-Predicted Post % 88   Pre FEV1/FVC % % 45   Post FEV1/FCV % % 46   FEV1-Pre L  1.44   FEV1-Predicted Pre % 50   FEV1-Post L 1.58   DLCO uncorrected ml/min/mmHg 21.57   DLCO UNC% % 80   DLCO corrected ml/min/mmHg 21.57   DLCO COR %Predicted % 80   DLVA Predicted % 85   TLC L 7.56   TLC % Predicted % 121   RV % Predicted % 170           Latest Ref Rng & Units 06/03/2022    9:16 AM 04/14/2022    2:05 PM 10/01/2021    1:00 PM  CBC  WBC 4.0 - 10.5 K/uL 10.0  8.7  7.8   Hemoglobin 13.0 - 17.0 g/dL 14.0  12.6  14.2   Hematocrit 39.0 - 52.0 % 42.0  38.0  41.6   Platelets 150 - 400 K/uL 245  214  282        Latest Ref Rng & Units 06/03/2022    9:16 AM 04/14/2022    5:30 PM 12/20/2020    2:41 PM  BMP  Glucose 70 - 99 mg/dL 133   110   BUN 8 - 23 mg/dL 8   20   Creatinine 0.61 - 1.24 mg/dL 0.81  1.00  1.09   Sodium 135 - 145 mmol/L 135   135   Potassium 3.5 - 5.1 mmol/L 3.0   3.9   Chloride 98 - 111 mmol/L 102   97   CO2 22 - 32 mmol/L 21   27   Calcium 8.9 - 10.3 mg/dL 9.2   8.8     BNP No results found for: "BNP"  ProBNP No results found for: "PROBNP"  PFT    Component Value Date/Time   FEV1PRE 1.44 06/27/2014 1035   FEV1POST 1.58 06/27/2014 1035   FVCPRE 3.23 06/27/2014 1035   FVCPOST 3.44 06/27/2014 1035   TLC 7.56 06/27/2014 1035   DLCOUNC 21.57 06/27/2014 1035   PREFEV1FVCRT 45 06/27/2014 1035   PSTFEV1FVCRT 46 06/27/2014 1035    DG Chest Port 1 View  Result Date: 06/03/2022 CLINICAL DATA:  S/P bronchoscopy EXAM: PORTABLE CHEST 1 VIEW COMPARISON:  04/26/2020. FINDINGS: Pulmonary nodule better characterized on recent CT chest. Post treatment changes. No consolidation. No visible pleural effusions or pneumothorax. Cardiomediastinal silhouette is unchanged. IMPRESSION: Status post bronchoscopy without visible pneumothorax. Electronically Signed   By: Margaretha Sheffield M.D.   On: 06/03/2022 11:46   DG C-ARM BRONCHOSCOPY  Result Date: 06/03/2022 C-ARM BRONCHOSCOPY: Fluoroscopy was utilized by the requesting physician.  No radiographic  interpretation.   CT Super D Chest Wo Contrast  Result Date: 06/02/2022 CLINICAL DATA:  76 year old male with history of left upper lobe pulmonary nodule and abnormal PET-CT. EXAM: CT CHEST WITHOUT CONTRAST TECHNIQUE: Multidetector CT imaging of the chest was performed using thin slice collimation for electromagnetic bronchoscopy planning purposes, without intravenous contrast. RADIATION DOSE REDUCTION: This exam was performed according to the departmental dose-optimization program which includes automated exposure control, adjustment of the mA and/or  kV according to patient size and/or use of iterative reconstruction technique. COMPARISON:  Multiple priors, most recently PET-CT 04/24/2022. Low-dose lung cancer screening chest CT 04/14/2022. FINDINGS: Cardiovascular: Heart size is normal. There is no significant pericardial fluid, thickening or pericardial calcification. There is aortic atherosclerosis, as well as atherosclerosis of the great vessels of the mediastinum and the coronary arteries, including calcified atherosclerotic plaque in the left main, left anterior descending, left circumflex and right coronary arteries. Mild calcifications of the aortic valve. Mediastinum/Nodes: No pathologically enlarged mediastinal or hilar lymph nodes. Please note that accurate exclusion of hilar adenopathy is limited on noncontrast CT scans. Moderate-sized hiatal hernia. No axillary lymphadenopathy. Lungs/Pleura: Previously noted nodule of concern in the posterior aspect of the left upper lobe (axial image 38 of series 4) currently measures 2.0 x 0.6 cm (mean diameter of 13 mm). Adjacent to this more inferiorly there is a smaller pulmonary nodule measuring 6 x 4 mm (axial image 51 of series 4), slightly more apparent than prior examinations. Status post right upper lobectomy. Compensatory hyperexpansion of the right middle and lower lobes. No acute consolidative airspace disease. Chronic mass-like architectural  distortion in the anterior aspect of the left lower lobe abutting the major fissure (axial image 99 of series 4), similar to prior examinations, likely reflecting chronic postradiation mass-like fibrosis. No pleural effusions. Diffuse bronchial wall thickening with moderate centrilobular and paraseptal emphysema. Upper Abdomen: Aortic atherosclerosis. Musculoskeletal: There are no aggressive appearing lytic or blastic lesions noted in the visualized portions of the skeleton. IMPRESSION: 1. Left upper lobe pulmonary nodule with mean diameter of 13 mm, similar to prior examinations, previously hypermetabolic on PET-CT 57/84/6962, suspicious for primary bronchogenic neoplasm. 2. Tiny adjacent 6 x 4 mm left upper lobe nodule slightly more apparent than prior studies. Attention on follow-up studies is recommended. 3. Other post treatment related changes in the lungs are stable compared to prior examinations, as above. 4. Diffuse bronchial wall thickening with moderate centrilobular and paraseptal emphysema; imaging findings suggestive of underlying COPD. 5. Aortic atherosclerosis, in addition to left main and three-vessel coronary artery disease. Assessment for potential risk factor modification, dietary therapy or pharmacologic therapy may be warranted, if clinically indicated. 6. There are mild calcifications of the aortic valve. Echocardiographic correlation for evaluation of potential valvular dysfunction may be warranted if clinically indicated. Aortic Atherosclerosis (ICD10-I70.0) and Emphysema (ICD10-J43.9). Electronically Signed   By: Vinnie Langton M.D.   On: 06/02/2022 09:26   MR Abdomen W Wo Contrast  Result Date: 05/17/2022 CLINICAL DATA:  Indeterminate cystic lesion of left kidney. EXAM: MRI ABDOMEN WITHOUT AND WITH CONTRAST TECHNIQUE: Multiplanar multisequence MR imaging of the abdomen was performed both before and after the administration of intravenous contrast. CONTRAST:  40mL GADAVIST GADOBUTROL 1  MMOL/ML IV SOLN COMPARISON:  Abdomen CTA on 04/14/2022 FINDINGS: Lower chest: No acute findings. Hepatobiliary: No hepatic masses identified. Layering gallbladder sludge noted, however there is no evidence of acute cholecystitis or biliary obstruction. Pancreas:  No mass or inflammatory changes. Spleen:  Within normal limits in size and appearance. Adrenals/Urinary Tract: Small benign-appearing Bosniak category 1 and 2 left renal cysts noted (no followup imaging is recommended). No suspicious masses identified. No evidence of hydronephrosis. Stomach/Bowel: Moderate size hiatal hernia.  Otherwise unremarkable. Vascular/Lymphatic: No pathologically enlarged lymph nodes identified. No acute vascular findings. Other:  None. Musculoskeletal: No suspicious bone lesions identified. Lumbar spine hardware noted. IMPRESSION: Small benign-appearing Bosniak category 1 and 2 left renal cysts (no followup imaging recommended). No evidence of renal neoplasm  or hydronephrosis. Layering gallbladder sludge, without evidence of cholecystitis or biliary obstruction. Moderate hiatal hernia. Electronically Signed   By: Marlaine Hind M.D.   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 dense RCA calcification --Non-ischemic Myoview   History of kidney stones    in the 70's   History of radiation therapy 07/04/14, 07/06/14, 07/10/14   LLL lung nodule/54 Gy/3 fx- SBRT   Hypercholesterolemia 05/06/2011   Pneumonia due to  Streptococcus    HX. POST OPERATIVELY   Pre-diabetes    Stress fx pelvis    DUE TO AA IN 1970     Social History   Tobacco Use   Smoking status: Former    Types: Cigarettes    Quit date: 09/08/2010    Years since quitting: 11.7   Smokeless tobacco: Current    Types: Chew   Tobacco comments:    still chews tobacco, he tells me he hasn't in the past 2 days.   Vaping Use   Vaping Use: Never used  Substance Use Topics   Alcohol use: No   Drug use: No    Mr.Congrove reports that he quit smoking about 11 years ago. His smoking use included cigarettes. His smokeless tobacco use includes chew. He reports that he does not drink alcohol and does not use drugs.  Tobacco Cessation: Former smoker , quit 2012 with an 86 pack year smoking history.   Past surgical hx, Family hx, Social hx all reviewed.  Current Outpatient Medications on File Prior to Visit  Medication Sig   albuterol (PROVENTIL HFA;VENTOLIN HFA) 108 (90 Base) MCG/ACT inhaler Inhale 2 puffs into the lungs every 4 (four) hours as needed for wheezing or shortness of breath.   cholecalciferol (VITAMIN D3) 25 MCG (1000 UNIT) tablet Take 1,000 Units by mouth daily.   cimetidine (TAGAMET) 200 MG tablet Take 400 mg by mouth daily as needed (for heartburn).   fluticasone (FLONASE) 50 MCG/ACT nasal spray Place 2 sprays into both nostrils daily as needed for rhinitis.   guaiFENesin (MUCINEX) 600 MG 12 hr tablet Take 600 mg by mouth daily.   hydrochlorothiazide (HYDRODIURIL) 25 MG tablet Take 12.5 mg by mouth daily.   HYDROcodone-acetaminophen (NORCO) 10-325 MG tablet Take 1-2 tablets by mouth every 4 (four) hours as needed for moderate pain.   Iron Combinations (CHROMAGEN) capsule Take 1 capsule by mouth daily.   Menthol-Methyl Salicylate (MUSCLE RUB EX) Apply 1 application topically 4 (four) times daily as needed (for pain).   neomycin-polymyxin-hydrocortisone (CORTISPORIN) 3.5-10000-1 OTIC suspension Place 1 drop into both ears as  needed (ear stop up).   Omega-3 Fatty Acids (FISH OIL) 1000 MG CAPS Take 1,000 mg by mouth daily.   Oxycodone HCl 10 MG TABS Take 10 mg by mouth daily as needed (pain).   pantoprazole (PROTONIX) 40 MG tablet Take 40 mg by mouth 2 (two) times  daily.   polyethylene glycol powder (GLYCOLAX/MIRALAX) 17 GM/SCOOP powder Take 0.5 Containers by mouth daily as needed for mild constipation or moderate constipation.   triamcinolone cream (KENALOG) 0.1 % Apply 1 Application topically daily as needed (tic bite).   triazolam (HALCION) 0.25 MG tablet Take 0.25 mg by mouth at bedtime.   vitamin B-12 (CYANOCOBALAMIN) 1000 MCG tablet Take 1,000 mcg by mouth daily.   rosuvastatin (CRESTOR) 40 MG tablet Take 1 tablet (40 mg total) by mouth daily.   No current facility-administered medications on file prior to visit.     Allergies  Allergen Reactions   Fentanyl Other (See Comments)    CAUSED HALLUCINATIONS/05-03-13 pt reports it was oral fentanyl that caused hallucinations    Gabapentin Other (See Comments)    Bones throbbed, headaches, weakness   Aspirin Nausea Only    Review Of Systems:  Constitutional:   No  weight loss, night sweats,  Fevers, chills, fatigue, or  lassitude.  HEENT:   No headaches,  Difficulty swallowing,  Tooth/dental problems, or  Sore throat,                No sneezing, itching, ear ache, nasal congestion, post nasal drip,   CV:  No chest pain,  Orthopnea, PND, swelling in lower extremities, anasarca, dizziness, palpitations, syncope.   GI  No heartburn, indigestion, abdominal pain, nausea, vomiting, diarrhea, change in bowel habits, loss of appetite, bloody stools.   Resp: + shortness of breath with exertion less at rest.  + baseline  excess mucus, + baseline  productive cough,  No non-productive cough,  No coughing up of blood.  No change in color of mucus.  No wheezing.  No chest wall deformity  Skin: no rash or lesions.  GU: no dysuria, change in color of urine, no urgency  or frequency.  No flank pain, no hematuria   MS:  No joint pain or swelling.  No decreased range of motion.  No back pain.  Psych:  No change in mood or affect. No depression or anxiety.  No memory loss.   Vital Signs BP 136/70 (BP Location: Right Arm, Patient Position: Sitting, Cuff Size: Normal)   Pulse (!) 55   Temp 98.4 F (36.9 C) (Oral)   Ht 5\' 6"  (1.676 m)   Wt 145 lb 6.4 oz (66 kg)   SpO2 97%   BMI 23.47 kg/m    Physical Exam:  General- No distress,  A&Ox3, pleasant  ENT: No sinus tenderness, TM clear, pale nasal mucosa, no oral exudate,no post nasal drip, no LAN Cardiac: S1, S2, regular rate and rhythm, no murmur Chest: No wheeze/ rales/ dullness; no accessory muscle use, no nasal flaring, no sternal retractions, diminished per right upper lobe Abd.: Soft Non-tender, ND, BS +, Body mass index is 23.47 kg/m.  Ext: No clubbing cyanosis, edema Neuro:  normal strength, MAE x 4, A&O x 3, appropriate Skin: No rashes, warm and dry, no lesions  Psych: normal mood and behavior   Assessment/Plan Adenocarcinoma of the Left Upper Lobe Previous history of  adenocarcinoma of the right lung with surgery in 2011. Previous history of Adenocarcinoma of the left lung 2015 with SBRT Recurrence adeno Left Upper Lobe  PET avid lesion of the right scapula Plan The biopsy of the left upper lobe was positive for adenocarcinoma.  The biopsy of the Right lung mainstem showed no malignant cells identified.  You have an appointment with Dr. Delton Coombes 06/16/2022, as is already schedsuled. He will discuss the need  to biopsy the area on your right shoulder blade. This will help determine your treatment path.  Work on quitting chewing tobacco.   COPD>> severe obstruction per PFT's 2015 Worsening dyspnea with exertion per wife, increased rescue use No maintenance therapy Plan We will do a therapeutic trial of Breztri Use 2 puffs in the morning and 2 puffs in the evening Rinse mouth after  use. Let us know if you you would like [place] to send in a prescription for the medication. If it is not covered well by your insurance, we will give you paperwork to complete.  We will give you paperwork for financial aid for [facility] today. Complete this and return to the office of you like the medication and want a prescription.  Continue using rescue inhaler as needed for breakthrough shortness of breath or wheezing.  Follow up in 3 months with our office in [place] for COPD. Call us if you need [place] sooner. Please contact office for sooner follow up if symptoms do not improve or worsen or seek emergency care        I spent 40 minutes dedicated to the care of this patient on the date of this encounter to include pre-visit review of records, face-to-face time with the patient discussing conditions above, post visit ordering of testing, clinical documentation with the electronic health record, making appropriate referrals as documented, and communicating necessary information to the patient's healthcare team.    Magdalen Spatz, NP 06/10/2022  11:10 AM

## 2022-06-11 NOTE — Progress Notes (Signed)
Results reviewed with SG, NP and office visit.  Patient has appointment with Dr. Delton Coombes from medical oncology.  Thanks,  BLI  Garner Nash, DO [facility] 06/11/2022 7:50 AM

## 2022-06-12 ENCOUNTER — Other Ambulatory Visit: Payer: Self-pay | Admitting: *Deleted

## 2022-06-12 NOTE — Progress Notes (Signed)
The proposed treatment discussed in conference is for discussion purpose only and is not a binding recommendation.  The patients have not been physically examined, or presented with their treatment options.  Therefore, final treatment plans cannot be decided.  

## 2022-06-16 ENCOUNTER — Inpatient Hospital Stay: Payer: Medicare Other | Admitting: Hematology

## 2022-06-24 ENCOUNTER — Inpatient Hospital Stay: Payer: Medicare Other | Attending: Physician Assistant | Admitting: Hematology

## 2022-06-24 VITALS — BP 151/75 | HR 84 | Temp 97.3°F | Resp 18 | Ht 66.0 in | Wt 143.8 lb

## 2022-06-24 DIAGNOSIS — C3412 Malignant neoplasm of upper lobe, left bronchus or lung: Secondary | ICD-10-CM | POA: Insufficient documentation

## 2022-06-24 DIAGNOSIS — E538 Deficiency of other specified B group vitamins: Secondary | ICD-10-CM | POA: Diagnosis not present

## 2022-06-24 DIAGNOSIS — C3490 Malignant neoplasm of unspecified part of unspecified bronchus or lung: Secondary | ICD-10-CM

## 2022-06-24 NOTE — Patient Instructions (Addendum)
[place] at [facility] Discharge Instructions   You were seen and examined today by Dr. Delton Coombes.  He reviewed the results of your biopsy. It shows that you have adenocarcinoma (cancer) in the left upper lobe of your lung.   There was an area that lit up on the PET scan from August on your right shoulder blade. We will need to get a biopsy of this area to determine the course of treatment for this cancer.   Return as scheduled after biopsy.     Thank you for choosing [place] at [facility] to provide your oncology and hematology care.  To afford each patient quality time with our provider, please arrive at least 15 minutes before your scheduled appointment time.   If you have a lab appointment with the [facility] please come in thru the Main Entrance and check in at the main information desk.  You need to re-schedule your appointment should you arrive 10 or more minutes late.  We strive to give you quality time with our providers, and arriving late affects you and other patients whose appointments are after yours.  Also, if you no show three or more times for appointments you may be dismissed from the clinic at the providers discretion.     Again, thank you for choosing [facility].  Our hope is that these requests will decrease the amount of time that you wait before being seen by our physicians.       _____________________________________________________________  Should you have questions after your visit to [facility], please contact our office at [phone] and follow the prompts.  Our office hours are 8:00 a.m. and 4:30 p.m. Monday - Friday.  Please note that voicemails left after 4:00 p.m. may not be returned until the following business day.  We are closed weekends and major holidays.  You do have access to a nurse 24-7, just call the main number to the clinic [phone] and do not press any  options, hold on the line and a nurse will answer the phone.    For prescription refill requests, have your pharmacy contact our office and allow 72 hours.    Due to Covid, you will need to wear a mask upon entering the hospital. If you do not have a mask, a mask will be given to you at the Main Entrance upon arrival. For doctor visits, patients may have 1 support person age 70 or older with them. For treatment visits, patients can not have anyone with them due to social distancing guidelines and our immunocompromised population.

## 2022-06-24 NOTE — Progress Notes (Signed)
[place], [place] [address]   CLINIC:  Medical Oncology/Hematology  PCP:  [facility], [place] / [place] [address] [phone]   REASON FOR VISIT:  Follow-up for history of bilateral lung cancers, normocytic anemia, and B12 deficiency  PRIOR THERAPY: - Right upper lobectomy (February 2012) for right lung cancer - SBRT (November 2015) for stage I left adenocarcinoma  NGS Results: Not done  CURRENT THERAPY: Surveillance  BRIEF ONCOLOGIC HISTORY:  Oncology History  Adenocarcinoma of lung ([facility])  09/30/2010 Cancer Staging   CT of chest showed spiculated lesion   10/15/2010 Surgery   Right upper lobectomy by Dr. Arlyce Dice   10/16/2010 Remission     10/24/2013 Imaging   CT Chest-  Enlarging subpleural nodule in the left lower lobe, now 9 mm, with distortion of the overlying pleura. Finding is concerning for a small bronchogenic carcinoma   01/23/2014 Imaging   CT Chest- Left lower lobe nodule has enlarged from 11/01/2012 and is worrisome for primary bronchogenic carcinoma.    07/04/2014 - 07/10/2014 Radiation Therapy   SBRT by Dr. Isidore Moos in 3 fractions.   07/05/2015 Imaging   perifissural nodule in LLL appears slightly smaller with parenchymal opacity attrib to XRT, likely inflamm pathcy airspace dz more inferiorly in LLL, airspace dz in RLL resolved. no metastatic disease   Primary cancer of left lower lobe of lung ([facility])  06/18/2014 Initial Diagnosis   Left lower lobe nodule suspicious for primary lung carcinoma   07/04/2014 - 07/10/2014 Radiation Therapy   SBRT   10/12/2014 Imaging   No acute findings within the chest. Decrease in size of left lower lobe perifissural nodule compatible with response to therapy. No new or progressive disease identified within the chest.     CANCER STAGING:  Cancer Staging  Adenocarcinoma of lung ([facility]) Staging form: Lung, AJCC 7th Edition - Clinical: Stage IA (T1a, N0, M0) -  Signed by Baird Cancer, PA on 05/06/2011   INTERVAL HISTORY:  Mr. Daltin Crist Ortley, a 76 y.o. male, seen for follow-up of lung cancer.  He reports pain in the right shoulder blade which has gotten worse in the last 2 weeks.  Pain is worse on moving the shoulder.  It is rated as 7 out of 10.  Energy levels are 60%.  Cough and shortness of breath, chronic has been stable.  REVIEW OF SYSTEMS:    Review of Systems  Respiratory:  Positive for cough and shortness of breath (COPD).   Skin: Negative.   Neurological:  Negative for dizziness, headaches and light-headedness.  Hematological:  Does not bruise/bleed easily.  All other systems reviewed and are negative.   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 dense RCA calcification --Non-ischemic Myoview   History of kidney stones    in the 70's   History of radiation therapy 07/04/14, 07/06/14, 07/10/14  LLL lung nodule/54 Gy/3 fx- SBRT   Hypercholesterolemia 05/06/2011   Pneumonia due to Streptococcus    HX. POST OPERATIVELY   Pre-diabetes    Stress fx pelvis    DUE TO AA IN 1970   Past Surgical History:  Procedure Laterality Date   BACK SURGERY  07/04/2017   fusion above previous surgery   BACK SURGERY  2011   metal plate l-4,L5   BRONCHIAL BIOPSY  06/03/2022   Procedure: BRONCHIAL BIOPSIES;  Surgeon: Garner Nash, DO;  Location: [facility];  Service: Pulmonary;;   BRONCHIAL BRUSHINGS  06/03/2022   Procedure: BRONCHIAL BRUSHINGS;   Surgeon: Garner Nash, DO;  Location: [facility];  Service: Pulmonary;;   BRONCHIAL NEEDLE ASPIRATION BIOPSY  06/03/2022   Procedure: BRONCHIAL NEEDLE ASPIRATION BIOPSIES;  Surgeon: Garner Nash, DO;  Location: [facility];  Service: Pulmonary;;   Coroanry Calcium Score  03/2017   1043. Noted especially in RCA. Dense calcification, recommend Myoview over Cor CTA.   FIDUCIAL MARKER PLACEMENT  06/03/2022   Procedure: FIDUCIAL MARKER PLACEMENT;  Surgeon: Garner Nash, DO;  Location: [facility];  Service: Pulmonary;;   LIPOMA EXCISION N/A 10/14/2017   Procedure: EXCISION LIPOMA ON BACK;  Surgeon: Virl Cagey, MD;  Location: [facility];  Service: General;  Laterality: N/A;   LIPOMA EXCISION Left 11/19/2018   Procedure: MINOR EXCISION OF SEBACEOUS CYST NECK;  Surgeon: Virl Cagey, MD;  Location: [facility];  Service: General;  Laterality: Left;  pt knows to arrive at 7:00   LUNG LOBECTOMY  2012   RT [place]  04/2017   EF 60%. Hypertensive response to exercise. (6:21 min, 7.7 METs) No EKG changes. LOW RISK    SOCIAL HISTORY:  Social History   Socioeconomic History   Marital status: Married    Spouse name: Not on file   Number of children: 3   Years of education: Not on file   Highest education level: Not on file  Occupational History   Not on file  Tobacco Use   Smoking status: Former    Packs/day: 1.50    Years: 59.00    Total pack years: 88.50    Types: Cigarettes    Quit date: 09/08/2010    Years since quitting: 11.8   Smokeless tobacco: Current    Types: Chew   Tobacco comments:    still chews tobacco, he tells me he hasn't in the past 2 days.   Vaping Use   Vaping Use: Never used  Substance and Sexual Activity   Alcohol use: No   Drug use: No   Sexual activity: Not Currently  Other Topics Concern   Not on file  Social History Narrative   Not on file   Social Determinants of Health   Financial Resource Strain: Not on file  Food  Insecurity: Not on file  Transportation Needs: No Transportation Needs (07/13/2018)   PRAPARE - Hydrologist (Medical): No    Lack of Transportation (Non-Medical): No  Physical Activity: Not on file  Stress: Not on file  Social Connections: Not on file  Intimate Partner Violence: Not At Risk (07/13/2018)   Humiliation, Afraid, Rape, and Kick questionnaire    Fear of Current or Ex-Partner: No    Emotionally Abused: No    Physically Abused: No    Sexually Abused: No    FAMILY HISTORY:  Family History  Problem Relation Age of Onset   Hypertension  Mother    Kidney disease Father    Heart attack Brother    Cancer Maternal Uncle        prostate cancer   Cancer Maternal Uncle        prostate cancer   Cancer Maternal Uncle        prostate cancer   Cancer Maternal Uncle        prostate cancer    CURRENT MEDICATIONS:  Current Outpatient Medications  Medication Sig Dispense Refill   albuterol (PROVENTIL HFA;VENTOLIN HFA) 108 (90 Base) MCG/ACT inhaler Inhale 2 puffs into the lungs every 4 (four) hours as needed for wheezing or shortness of breath. 2 Inhaler 3   Budeson-Glycopyrrol-Formoterol (BREZTRI AEROSPHERE) 160-9-4.8 MCG/ACT AERO Inhale 2 puffs into the lungs in the morning and at bedtime. 1 each 0   cholecalciferol (VITAMIN D3) 25 MCG (1000 UNIT) tablet Take 1,000 Units by mouth daily.     cimetidine (TAGAMET) 200 MG tablet Take 400 mg by mouth daily as needed (for heartburn).     fluticasone (FLONASE) 50 MCG/ACT nasal spray Place 2 sprays into both nostrils daily as needed for rhinitis.     guaiFENesin (MUCINEX) 600 MG 12 hr tablet Take 600 mg by mouth daily.     hydrochlorothiazide (HYDRODIURIL) 25 MG tablet Take 12.5 mg by mouth daily.     HYDROcodone-acetaminophen (NORCO) 10-325 MG tablet Take 1-2 tablets by mouth every 4 (four) hours as needed for moderate pain. 30 tablet 0   Iron Combinations (CHROMAGEN) capsule Take 1 capsule by mouth daily.      Menthol-Methyl Salicylate (MUSCLE RUB EX) Apply 1 application topically 4 (four) times daily as needed (for pain).     neomycin-polymyxin-hydrocortisone (CORTISPORIN) 3.5-10000-1 OTIC suspension Place 1 drop into both ears as needed (ear stop up).     Omega-3 Fatty Acids (FISH OIL) 1000 MG CAPS Take 1,000 mg by mouth daily.     Oxycodone HCl 10 MG TABS Take 10 mg by mouth daily as needed (pain).     pantoprazole (PROTONIX) 40 MG tablet Take 40 mg by mouth 2 (two) times daily.     polyethylene glycol powder (GLYCOLAX/MIRALAX) 17 GM/SCOOP powder Take 0.5 Containers by mouth daily as needed for mild constipation or moderate constipation.     rosuvastatin (CRESTOR) 40 MG tablet Take 1 tablet (40 mg total) by mouth daily. 90 tablet 3   triamcinolone cream (KENALOG) 0.1 % Apply 1 Application topically daily as needed (tic bite).     triazolam (HALCION) 0.25 MG tablet Take 0.25 mg by mouth at bedtime.     vitamin B-12 (CYANOCOBALAMIN) 1000 MCG tablet Take 1,000 mcg by mouth daily.     No current facility-administered medications for this visit.    ALLERGIES:  Allergies  Allergen Reactions   Fentanyl Other (See Comments)    CAUSED HALLUCINATIONS/05-03-13 pt reports it was oral fentanyl that caused hallucinations    Gabapentin Other (See Comments)    Bones throbbed, headaches, weakness   Aspirin Nausea Only    PHYSICAL EXAM:    Performance status (ECOG): 1 - Symptomatic but completely ambulatory  There were no vitals filed for this visit.  Wt Readings from Last 3 Encounters:  06/10/22 145 lb 6.4 oz (66 kg)  06/03/22 145 lb (65.8 kg)  05/26/22 146 lb (66.2 kg)   Physical Exam Constitutional:      Appearance: Normal appearance.  HENT:     Head: Normocephalic and atraumatic.     Mouth/Throat:     Mouth:  Mucous membranes are moist.  Eyes:     Extraocular Movements: Extraocular movements intact.     Pupils: Pupils are equal, round, and reactive to light.  Cardiovascular:     Rate and  Rhythm: Normal rate and regular rhythm.     Pulses: Normal pulses.     Heart sounds: Normal heart sounds.  Pulmonary:     Effort: Pulmonary effort is normal.     Breath sounds: Decreased breath sounds present.     Comments: Coarse breath sounds and decreased air movement in all lung fields. Abdominal:     General: Bowel sounds are normal.     Palpations: Abdomen is soft.     Tenderness: There is no abdominal tenderness.  Musculoskeletal:        General: No swelling.     Right lower leg: No edema.     Left lower leg: No edema.  Lymphadenopathy:     Cervical: No cervical adenopathy.  Skin:    General: Skin is warm and dry.  Neurological:     General: No focal deficit present.     Mental Status: He is alert and oriented to person, place, and time.  Psychiatric:        Mood and Affect: Mood normal.        Behavior: Behavior normal.     LABORATORY DATA:  I have reviewed the labs as listed.     Latest Ref Rng & Units 06/03/2022    9:16 AM 04/14/2022    2:05 PM 10/01/2021    1:00 PM  CBC  WBC 4.0 - 10.5 K/uL 10.0  8.7  7.8   Hemoglobin 13.0 - 17.0 g/dL 14.0  12.6  14.2   Hematocrit 39.0 - 52.0 % 42.0  38.0  41.6   Platelets 150 - 400 K/uL 245  214  282       Latest Ref Rng & Units 06/03/2022    9:16 AM 04/14/2022    5:30 PM 12/20/2020    2:41 PM  CMP  Glucose 70 - 99 mg/dL 133   110   BUN 8 - 23 mg/dL 8   20   Creatinine 0.61 - 1.24 mg/dL 0.81  1.00  1.09   Sodium 135 - 145 mmol/L 135   135   Potassium 3.5 - 5.1 mmol/L 3.0   3.9   Chloride 98 - 111 mmol/L 102   97   CO2 22 - 32 mmol/L 21   27   Calcium 8.9 - 10.3 mg/dL 9.2   8.8   Total Protein 6.5 - 8.1 g/dL   7.3   Total Bilirubin 0.3 - 1.2 mg/dL   0.5   Alkaline Phos 38 - 126 U/L   57   AST 15 - 41 U/L   17   ALT 0 - 44 U/L   15     DIAGNOSTIC IMAGING:  I have independently reviewed the scans and discussed with the patient. DG Chest Port 1 View  Result Date: 06/03/2022 CLINICAL DATA:  S/P bronchoscopy EXAM:  PORTABLE CHEST 1 VIEW COMPARISON:  04/26/2020. FINDINGS: Pulmonary nodule better characterized on recent CT chest. Post treatment changes. No consolidation. No visible pleural effusions or pneumothorax. Cardiomediastinal silhouette is unchanged. IMPRESSION: Status post bronchoscopy without visible pneumothorax. Electronically Signed   By: Margaretha Sheffield M.D.   On: 06/03/2022 11:46   DG C-ARM BRONCHOSCOPY  Result Date: 06/03/2022 C-ARM BRONCHOSCOPY: Fluoroscopy was utilized by the requesting physician.  No radiographic interpretation.   CT Super  D Chest Wo Contrast  Result Date: 06/02/2022 CLINICAL DATA:  76 year old male with history of left upper lobe pulmonary nodule and abnormal PET-CT. EXAM: CT CHEST WITHOUT CONTRAST TECHNIQUE: Multidetector CT imaging of the chest was performed using thin slice collimation for electromagnetic bronchoscopy planning purposes, without intravenous contrast. RADIATION DOSE REDUCTION: This exam was performed according to the departmental dose-optimization program which includes automated exposure control, adjustment of the mA and/or kV according to patient size and/or use of iterative reconstruction technique. COMPARISON:  Multiple priors, most recently PET-CT 04/24/2022. Low-dose lung cancer screening chest CT 04/14/2022. FINDINGS: Cardiovascular: Heart size is normal. There is no significant pericardial fluid, thickening or pericardial calcification. There is aortic atherosclerosis, as well as atherosclerosis of the great vessels of the mediastinum and the coronary arteries, including calcified atherosclerotic plaque in the left main, left anterior descending, left circumflex and right coronary arteries. Mild calcifications of the aortic valve. Mediastinum/Nodes: No pathologically enlarged mediastinal or hilar lymph nodes. Please note that accurate exclusion of hilar adenopathy is limited on noncontrast CT scans. Moderate-sized hiatal hernia. No axillary lymphadenopathy.  Lungs/Pleura: Previously noted nodule of concern in the posterior aspect of the left upper lobe (axial image 38 of series 4) currently measures 2.0 x 0.6 cm (mean diameter of 13 mm). Adjacent to this more inferiorly there is a smaller pulmonary nodule measuring 6 x 4 mm (axial image 51 of series 4), slightly more apparent than prior examinations. Status post right upper lobectomy. Compensatory hyperexpansion of the right middle and lower lobes. No acute consolidative airspace disease. Chronic mass-like architectural distortion in the anterior aspect of the left lower lobe abutting the major fissure (axial image 99 of series 4), similar to prior examinations, likely reflecting chronic postradiation mass-like fibrosis. No pleural effusions. Diffuse bronchial wall thickening with moderate centrilobular and paraseptal emphysema. Upper Abdomen: Aortic atherosclerosis. Musculoskeletal: There are no aggressive appearing lytic or blastic lesions noted in the visualized portions of the skeleton. IMPRESSION: 1. Left upper lobe pulmonary nodule with mean diameter of 13 mm, similar to prior examinations, previously hypermetabolic on PET-CT 11/94/1740, suspicious for primary bronchogenic neoplasm. 2. Tiny adjacent 6 x 4 mm left upper lobe nodule slightly more apparent than prior studies. Attention on follow-up studies is recommended. 3. Other post treatment related changes in the lungs are stable compared to prior examinations, as above. 4. Diffuse bronchial wall thickening with moderate centrilobular and paraseptal emphysema; imaging findings suggestive of underlying COPD. 5. Aortic atherosclerosis, in addition to left main and three-vessel coronary artery disease. Assessment for potential risk factor modification, dietary therapy or pharmacologic therapy may be warranted, if clinically indicated. 6. There are mild calcifications of the aortic valve. Echocardiographic correlation for evaluation of potential valvular dysfunction  may be warranted if clinically indicated. Aortic Atherosclerosis (ICD10-I70.0) and Emphysema (ICD10-J43.9). Electronically Signed   By: Vinnie Langton M.D.   On: 06/02/2022 09:26     ASSESSMENT:  1.  History of bilateral lung cancer: - Stage I left lung adenocarcinoma, status post SBRT on 07/10/2014. - Status post right upper lobectomy in February 2012 for right lung cancer. - LDCT chest (04/14/2022): Enlarging spiculated nodular soft tissue thickening in the posterior left upper lobe measuring 11.4 mm. - PET scan (04/24/2022): Left upper lobe lung nodule 14 mm with SUV 3.62.  Hypermetabolic focus in the inferior aspect of the right scapula without discrete CT correlate with SUV 5.71.  No hypermetabolic adenopathy.   PLAN:  1.  Normocytic anemia - Continue iron tablet twice daily.  2.  Left lung adenocarcinoma: - Bronchoscopy and biopsy on 06/03/2022 by Dr. Lamonte Sakai. - Left upper lobe FNA consistent with adenocarcinoma, positive for TTF-1 and negative for p40. - He reported pain in the right scapular region for the last 2 weeks, worse on moving.  He already takes hydrocodone for his back pain which is not completely helping with this pain. - Since the PET scan showed uptake in this area, I have recommended IR guided biopsy of the scapular lesion. - If malignancy is confirmed, will consider radiation therapy to the area to control pain. - Recommend sending NGS testing on the left lung biopsy. - RTC after the biopsy of the right scapular lesion.   3.  B12 deficiency - Continue B12 tablet every other day.  Last B12 level was normal.   4.  Cystic lesion of left kidney - CTA PE protocol on 04/14/2022 showed complex cystic lesion of the left kidney measuring 1.8 x 1.5 x 1.9 cm. - MRI of the abdomen on 05/16/2022 showed small benign appearing Bosniak category 1 and 2 left renal cysts with no evidence of renal neoplasm.   All questions were answered. The patient knows to call the clinic with any  problems, questions or concerns.    Derek Jack, MD  04/21/2022 7:19 PM

## 2022-06-29 DIAGNOSIS — C3412 Malignant neoplasm of upper lobe, left bronchus or lung: Secondary | ICD-10-CM | POA: Diagnosis not present

## 2022-06-30 ENCOUNTER — Encounter (HOSPITAL_COMMUNITY): Payer: Self-pay

## 2022-06-30 NOTE — Progress Notes (Unsigned)
Juliet Rude, MD  Maurice Orozco; P Ir Procedure Requests This is a pretty atypical biopsy. There is no corresponding bone lesion on plain CT, but it could be approached with empiric CT core biopsy of the inferior scapula using our bone marrow needle.   I can approve it for CT core biopsy with sedation, but the case should be reviewed by the IR scheduled that day as well.   Thanks.

## 2022-07-02 ENCOUNTER — Other Ambulatory Visit: Payer: Self-pay | Admitting: Radiology

## 2022-07-02 ENCOUNTER — Telehealth: Payer: Self-pay | Admitting: Pulmonary Disease

## 2022-07-02 DIAGNOSIS — M899 Disorder of bone, unspecified: Secondary | ICD-10-CM

## 2022-07-02 NOTE — Telephone Encounter (Signed)
Called Report to [facility] and confirmed we have the report in Pt's chart.  Copy of Impression is below   ADDENDUM REPORT: 07/02/2022 06:06   ADDENDUM: Upon further review, in the inferior aspect of the right scapula there is a lytic lesion (axial image 80 of series 4) which measures 1.6 x 1.4 cm, which corresponds to the area of hypermetabolism on recent PET-CT. This is presumably a metastatic lesion and appears more evident than on the CT images from the April 24, 2022 PET CT examination.   These results will be called to the ordering clinician or representative by the Radiologist Assistant, and communication documented in the PACS or [place] to Dr. Valeta Harms

## 2022-07-02 NOTE — H&P (Signed)
Chief Complaint: Patient was seen in consultation today for  at the request of right scapular lytic lesion Katragadda,Sreedhar  Referring Physician(s): Katragadda,Sreedhar  Supervising Physician: Markus Daft  Patient Status: [facility] - Out-pt  History of Present Illness: Maurice Orozco is a 76 y.o. male with medical history significant for bilateral lung cancer s/p RU lobectomy (2012). Pt is followed by Dr. Delton Coombes, oncology. On a recent follow-up visit for lung cancer, pt c/o right scapular pain that is exacerbated with movement. PET scan showed hypermetabloic focus in the same area. Super D chest showed lytic lesion of right scapula. Pt referred to IR for lytic lesion biopsy. Procedure was approved by Dr. Denna Haggard.   Pt denies fever, chill, CP, SOB, N/V. He is NPO per order.   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 dense RCA calcification --Non-ischemic Myoview   History of kidney stones    in the 70's   History of radiation therapy 07/04/14, 07/06/14, 07/10/14   LLL lung nodule/54 Gy/3 fx- SBRT   Hypercholesterolemia 05/06/2011   Pneumonia due to Streptococcus    HX. POST OPERATIVELY   Pre-diabetes    Stress fx pelvis    DUE TO AA IN 1970    Past Surgical History:  Procedure Laterality Date   BACK SURGERY  07/04/2017   fusion above previous surgery   BACK SURGERY  2011   metal plate l-4,L5   BRONCHIAL BIOPSY  06/03/2022   Procedure: BRONCHIAL BIOPSIES;  Surgeon: Garner Nash, DO;  Location: [facility]  END[facility]COPY;  Service: Pulmonary;;   BRONCHIAL BRUSHINGS  06/03/2022   Procedure: BRONCHIAL BRUSHINGS;  Surgeon: Garner Nash, DO;  Location: [place];  Service: Pulmonary;;   BRONCHIAL NEEDLE ASPIRATION BIOPSY  06/03/2022   Procedure: BRONCHIAL NEEDLE ASPIRATION BIOPSIES;  Surgeon: Garner Nash, DO;  Location: [place];  Service: Pulmonary;;   Coroanry Calcium Score  03/2017   1043. Noted especially in RCA. Dense calcification, recommend Myoview over Cor CTA.   FIDUCIAL MARKER PLACEMENT  06/03/2022   Procedure: FIDUCIAL MARKER PLACEMENT;  Surgeon: Garner Nash, DO;  Location: [facility];  Service: Pulmonary;;   LIPOMA EXCISION N/A 10/14/2017   Procedure: EXCISION LIPOMA ON BACK;  Surgeon: Virl Cagey, MD;  Location: [facility];  Service: General;  Laterality: N/A;   LIPOMA EXCISION Left 11/19/2018   Procedure: MINOR EXCISION OF SEBACEOUS CYST NECK;  Surgeon: Virl Cagey, MD;  Location: [facility];  Service: General;  Laterality: Left;  pt knows to arrive at 7:00   LUNG LOBECTOMY  2012   RT [place]  04/2017   EF 60%. Hypertensive response to exercise. (6:21 min, 7.7 METs) No EKG changes. LOW RISK    Allergies: Fentanyl, Gabapentin, Nabumetone, and Aspirin  Medications: Prior to Admission medications   Medication Sig Start Date End Date Taking? Authorizing Provider  albuterol (PROVENTIL HFA;VENTOLIN HFA) 108 (90 Base)  MCG/ACT inhaler Inhale 2 puffs into the lungs every 4 (four) hours as needed for wheezing or shortness of breath. 04/23/18  Yes Martyn Ehrich, NP  albuterol (PROVENTIL) (2.5 MG/3ML) 0.083% nebulizer solution Take 2.5 mg by nebulization every 6 (six) hours as needed for wheezing or shortness of breath.   Yes [provider]  cholecalciferol (VITAMIN D3) 25 MCG (1000 UNIT) tablet Take 1,000 Units by mouth daily.   Yes [provider]  cimetidine (TAGAMET) 200 MG tablet Take 400-600 mg by mouth daily as needed (for  heartburn).   Yes [provider]  fluticasone (FLONASE) 50 MCG/ACT nasal spray Place 2 sprays into both nostrils daily as needed for rhinitis.   Yes [provider]  guaiFENesin (MUCINEX) 600 MG 12 hr tablet Take 600 mg by mouth daily. 04/28/20  Yes [provider]  hydrochlorothiazide (HYDRODIURIL) 25 MG tablet Take 12.5 mg by mouth daily. 08/14/20  Yes [provider]  HYDROcodone-acetaminophen (NORCO) 10-325 MG tablet Take 1-2 tablets by mouth every 4 (four) hours as needed for moderate pain. 04/28/20  Yes Kathie Dike, MD  Iron Combinations (CHROMAGEN) capsule Take 2 capsules by mouth daily.   Yes [provider]  Menthol, Topical Analgesic, (ICY HOT EX) Apply 1 Application topically daily as needed (pain).   Yes [provider]  naproxen sodium (ALEVE) 220 MG tablet Take 220 mg by mouth daily as needed (pain).   Yes [provider]  neomycin-polymyxin-hydrocortisone (CORTISPORIN) 3.5-10000-1 OTIC suspension Place 3-4 drops into both ears as needed (ear stop up). 07/16/20  Yes [provider]  Omega-3 Fatty Acids (FISH OIL) 1000 MG CAPS Take 1,000 mg by mouth daily.   Yes [provider]  Oxycodone HCl 10 MG TABS Take 10 mg by mouth 2 (two) times daily as needed (pain). 09/16/21  Yes [provider]  pantoprazole (PROTONIX) 40 MG tablet Take 40 mg by mouth See admin instructions. Take 40 mg daily, may take a second 40 mg dose as needed for heartburn 03/01/20  Yes [provider]  polyethylene glycol powder (GLYCOLAX/MIRALAX) 17 GM/SCOOP powder Take 17 g by mouth daily as needed for mild constipation or moderate constipation.   Yes [provider]  rosuvastatin (CRESTOR) 40 MG tablet Take 1 tablet (40 mg total) by mouth daily. Patient taking differently: Take 40 mg by mouth at bedtime. 11/01/18 07/01/22 Yes Leonie Man, MD  triamcinolone cream (KENALOG) 0.1 % Apply 1 Application topically  daily as needed (tic bite/itching). 04/29/22  Yes [provider]  triazolam (HALCION) 0.25 MG tablet Take 0.25 mg by mouth at bedtime. 05/09/22  Yes [provider]  vitamin B-12 (CYANOCOBALAMIN) 1000 MCG tablet Take 1,000 mcg by mouth every other day.   Yes [provider]  Budeson-Glycopyrrol-Formoterol (BREZTRI AEROSPHERE) 160-9-4.8 MCG/ACT AERO Inhale 2 puffs into the lungs in the morning and at bedtime. Patient not taking: Reported on 07/01/2022 06/10/22   Magdalen Spatz, NP     Family History  Problem Relation Age of Onset   Hypertension Mother    Kidney disease Father    Heart attack Brother    Cancer Maternal Uncle        prostate cancer   Cancer Maternal Uncle        prostate cancer   Cancer Maternal Uncle        prostate cancer   Cancer Maternal Uncle        prostate cancer    Social History   Socioeconomic  History   Marital status: Married    Spouse name: Not on file   Number of children: 3   Years of education: Not on file   Highest education level: Not on file  Occupational History   Not on file  Tobacco Use   Smoking status: Former    Packs/day: 1.50    Years: 59.00    Total pack years: 88.50    Types: Cigarettes    Quit date: 09/08/2010    Years since quitting: 11.8   Smokeless tobacco: Current    Types: Chew   Tobacco comments:    still chews tobacco, he tells me he hasn't in the past 2 days.   Vaping Use   Vaping Use: Never used  Substance and Sexual Activity   Alcohol use: No   Drug use: No   Sexual activity: Not Currently  Other Topics Concern   Not on file  Social History Narrative   Not on file   Social Determinants of Health   Financial Resource Strain: Not on file  Food Insecurity: Not on file  Transportation Needs: No Transportation Needs (07/13/2018)   PRAPARE - Hydrologist (Medical): No    Lack of Transportation (Non-Medical): No  Physical Activity: Not on file  Stress: Not on  file  Social Connections: Not on file    Review of Systems: A 12 point ROS discussed and pertinent positives are indicated in the HPI above.  All other systems are negative.  Review of Systems  Constitutional:  Negative for appetite change, chills and fever.  Respiratory:  Negative for shortness of breath.   Cardiovascular:  Negative for chest pain and leg swelling.  Gastrointestinal:  Negative for abdominal pain, nausea and vomiting.  Musculoskeletal:  Positive for back pain.  Neurological:  Negative for dizziness and headaches.    Vital Signs: BP (!) 145/72   Pulse 62   Temp 98.2 F (36.8 C) (Oral)   Resp 16   Ht 5\' 6"  (1.676 m)   Wt 144 lb (65.3 kg)   SpO2 99%   BMI 23.24 kg/m     Physical Exam Vitals reviewed.  Constitutional:      General: He is not in acute distress.    Appearance: Normal appearance. He is not ill-appearing.  HENT:     Head: Normocephalic and atraumatic.     Mouth/Throat:     Mouth: Mucous membranes are dry.     Pharynx: Oropharynx is clear.  Eyes:     Extraocular Movements: Extraocular movements intact.     Pupils: Pupils are equal, round, and reactive to light.  Cardiovascular:     Rate and Rhythm: Normal rate and regular rhythm.     Pulses: Normal pulses.     Heart sounds: Normal heart sounds. No murmur heard. Pulmonary:     Effort: Pulmonary effort is normal. No respiratory distress.     Comments: Decreased breath sounds Abdominal:     General: Bowel sounds are normal. There is no distension.     Palpations: Abdomen is soft.     Tenderness: There is no abdominal tenderness. There is no guarding.  Musculoskeletal:     Right lower leg: No edema.     Left lower leg: No edema.  Skin:    General: Skin is warm and dry.  Neurological:     Mental Status: He is alert and oriented to person, place, and time.  Psychiatric:        Mood  and Affect: Mood normal.        Behavior: Behavior normal.        Thought Content: Thought content  normal.        Judgment: Judgment normal.     Imaging: DG Chest Port 1 View  Result Date: 06/03/2022 CLINICAL DATA:  S/P bronchoscopy EXAM: PORTABLE CHEST 1 VIEW COMPARISON:  04/26/2020. FINDINGS: Pulmonary nodule better characterized on recent CT chest. Post treatment changes. No consolidation. No visible pleural effusions or pneumothorax. Cardiomediastinal silhouette is unchanged. IMPRESSION: Status post bronchoscopy without visible pneumothorax. Electronically Signed   By: Margaretha Sheffield M.D.   On: 06/03/2022 11:46   DG C-ARM BRONCHOSCOPY  Result Date: 06/03/2022 C-ARM BRONCHOSCOPY: Fluoroscopy was utilized by the requesting physician.  No radiographic interpretation.    Labs:  CBC: Recent Labs    10/01/21 1300 04/14/22 1405 06/03/22 0916 07/03/22 0630  WBC 7.8 8.7 10.0 7.9  HGB 14.2 12.6* 14.0 14.0  HCT 41.6 38.0* 42.0 42.0  PLT 282 214 245 300    COAGS: Recent Labs    07/03/22 0630  INR 1.0    BMP: Recent Labs    04/14/22 1730 06/03/22 0916  NA  --  135  K  --  3.0*  CL  --  102  CO2  --  21*  GLUCOSE  --  133*  BUN  --  8  CALCIUM  --  9.2  CREATININE 1.00 0.81  GFRNONAA  --  >60    LIVER FUNCTION TESTS: No results for input(s): "BILITOT", "AST", "ALT", "ALKPHOS", "PROT", "ALBUMIN" in the last 8760 hours.  TUMOR MARKERS: No results for input(s): "AFPTM", "CEA", "CA199", "CHROMGRNA" in the last 8760 hours.  Assessment and Plan: 76 yo male presents to IR for biopsy of right scapular lytic lesion.   Pt resting on stretcher. He is A&O, calm and pleasant.  He is in no distress.   Risks and benefits of core biopsy of inferior scapula with moderate sedation was discussed with the patient and/or patient's family including, but not limited to bleeding, infection, damage to adjacent structures or low yield requiring additional tests.  All of the questions were answered and there is agreement to proceed.  Consent signed and in chart.   Thank you for  this interesting consult.  I greatly enjoyed [place] and look forward to participating in their care.  A copy of this report was sent to the requesting provider on this date.  Electronically Signed: Tyson Alias, NP 07/03/2022, 7:25 AM   I spent a total of 20 minutes in face to face in clinical consultation, greater than 50% of which was counseling/coordinating care for right scapular lytic lesion.

## 2022-07-03 ENCOUNTER — Ambulatory Visit (HOSPITAL_COMMUNITY)
Admission: RE | Admit: 2022-07-03 | Discharge: 2022-07-03 | Disposition: A | Discharge status: Home / Self Care | Payer: Medicare Other | Source: Ambulatory Visit | Attending: Hematology | Admitting: Hematology

## 2022-07-03 ENCOUNTER — Encounter (HOSPITAL_COMMUNITY): Payer: Self-pay

## 2022-07-03 DIAGNOSIS — C3492 Malignant neoplasm of unspecified part of left bronchus or lung: Secondary | ICD-10-CM | POA: Insufficient documentation

## 2022-07-03 DIAGNOSIS — C7951 Secondary malignant neoplasm of bone: Secondary | ICD-10-CM | POA: Insufficient documentation

## 2022-07-03 DIAGNOSIS — Z9889 Other specified postprocedural states: Secondary | ICD-10-CM | POA: Insufficient documentation

## 2022-07-03 DIAGNOSIS — M899 Disorder of bone, unspecified: Secondary | ICD-10-CM | POA: Diagnosis not present

## 2022-07-03 DIAGNOSIS — C3490 Malignant neoplasm of unspecified part of unspecified bronchus or lung: Secondary | ICD-10-CM

## 2022-07-03 DIAGNOSIS — Z902 Acquired absence of lung [part of]: Secondary | ICD-10-CM | POA: Insufficient documentation

## 2022-07-03 DIAGNOSIS — M898X8 Other specified disorders of bone, other site: Secondary | ICD-10-CM | POA: Diagnosis not present

## 2022-07-03 LAB — CBC
HCT: 42 % (ref 39.0–52.0)
Hemoglobin: 14 g/dL (ref 13.0–17.0)
MCH: 33.3 pg (ref 26.0–34.0)
MCHC: 33.3 g/dL (ref 30.0–36.0)
MCV: 100 fL (ref 80.0–100.0)
Platelets: 300 10*3/uL (ref 150–400)
RBC: 4.2 MIL/uL — ABNORMAL LOW (ref 4.22–5.81)
RDW: 12 % (ref 11.5–15.5)
WBC: 7.9 10*3/uL (ref 4.0–10.5)
nRBC: 0 % (ref 0.0–0.2)

## 2022-07-03 LAB — PROTIME-INR
INR: 1 (ref 0.8–1.2)
Prothrombin Time: 13.4 seconds (ref 11.4–15.2)

## 2022-07-03 MED ORDER — HYDROCODONE-ACETAMINOPHEN 5-325 MG PO TABS: 1.0000 | ORAL_TABLET | ORAL | Status: DC | PRN

## 2022-07-03 MED ORDER — MIDAZOLAM HCL 2 MG/2ML IJ SOLN
INTRAMUSCULAR | Status: AC | PRN
  Administered 2022-07-03: 1 mg via INTRAVENOUS

## 2022-07-03 MED ORDER — MIDAZOLAM HCL 2 MG/2ML IJ SOLN
INTRAMUSCULAR | Status: AC
  Filled 2022-07-03: qty 2

## 2022-07-03 MED ORDER — HYDROMORPHONE HCL 1 MG/ML IJ SOLN
INTRAMUSCULAR | Status: AC | PRN
  Administered 2022-07-03 (×2): .5 mg via INTRAVENOUS

## 2022-07-03 MED ORDER — HYDROMORPHONE HCL 1 MG/ML IJ SOLN
INTRAMUSCULAR | Status: AC
  Filled 2022-07-03: qty 1

## 2022-07-03 MED ORDER — SODIUM CHLORIDE 0.9 % IV SOLN: INTRAVENOUS | Status: DC

## 2022-07-03 MED ORDER — LIDOCAINE HCL 1 % IJ SOLN
10.0000 mL | Freq: Once | INTRAMUSCULAR | Status: AC
  Administered 2022-07-03: 10 mL via INTRADERMAL

## 2022-07-03 NOTE — Progress Notes (Signed)
Pt and wife received d/c instructions, written and verbal. All questions and concerns addressed. PIV removed and intact.

## 2022-07-03 NOTE — Procedures (Signed)
Interventional Radiology Procedure:   Indications: Lung cancer and bone lesion  Procedure: CT guided biopsy of right scapular bone lesion  Findings: Lytic lesion in right scapula, bone needle confirmed in lesion.  Specimens placed in formalin.   Complications: No immediate complications noted.     EBL: Minimal  Plan: Discharge to home with 1 hour recovery.     Preston Weill R. Anselm Pancoast, MD  Pager: [phone]

## 2022-07-04 ENCOUNTER — Other Ambulatory Visit (HOSPITAL_BASED_OUTPATIENT_CLINIC_OR_DEPARTMENT_OTHER): Payer: Self-pay

## 2022-07-04 ENCOUNTER — Encounter (HOSPITAL_COMMUNITY): Payer: Self-pay | Admitting: Hematology

## 2022-07-04 LAB — SURGICAL PATHOLOGY

## 2022-07-04 MED ORDER — HYDROCODONE-ACETAMINOPHEN 10-325 MG PO TABS
1.0000 | ORAL_TABLET | ORAL | 0 refills | Status: DC
  Filled 2022-07-04: qty 240, 20d supply, fill #0
  Filled 2022-07-04: qty 240, 30d supply, fill #0
  Filled 2022-07-04: qty 240, 20d supply, fill #0

## 2022-07-16 ENCOUNTER — Other Ambulatory Visit (HOSPITAL_BASED_OUTPATIENT_CLINIC_OR_DEPARTMENT_OTHER): Payer: Self-pay

## 2022-07-16 ENCOUNTER — Inpatient Hospital Stay: Payer: Medicare Other | Attending: Physician Assistant | Admitting: Hematology

## 2022-07-16 ENCOUNTER — Encounter: Payer: Self-pay | Admitting: Hematology

## 2022-07-16 VITALS — BP 176/81 | HR 78 | Temp 97.9°F | Resp 18 | Wt 142.2 lb

## 2022-07-16 DIAGNOSIS — C3490 Malignant neoplasm of unspecified part of unspecified bronchus or lung: Secondary | ICD-10-CM | POA: Diagnosis not present

## 2022-07-16 DIAGNOSIS — Z95828 Presence of other vascular implants and grafts: Secondary | ICD-10-CM

## 2022-07-16 DIAGNOSIS — C3412 Malignant neoplasm of upper lobe, left bronchus or lung: Secondary | ICD-10-CM | POA: Diagnosis not present

## 2022-07-16 DIAGNOSIS — Z5112 Encounter for antineoplastic immunotherapy: Secondary | ICD-10-CM | POA: Diagnosis not present

## 2022-07-16 DIAGNOSIS — Z79899 Other long term (current) drug therapy: Secondary | ICD-10-CM | POA: Insufficient documentation

## 2022-07-16 DIAGNOSIS — C7951 Secondary malignant neoplasm of bone: Secondary | ICD-10-CM | POA: Diagnosis not present

## 2022-07-16 DIAGNOSIS — E538 Deficiency of other specified B group vitamins: Secondary | ICD-10-CM | POA: Insufficient documentation

## 2022-07-16 DIAGNOSIS — Z87891 Personal history of nicotine dependence: Secondary | ICD-10-CM | POA: Insufficient documentation

## 2022-07-16 MED ORDER — HYDROCODONE-ACETAMINOPHEN 10-325 MG PO TABS
1.0000 | ORAL_TABLET | ORAL | 0 refills | Status: DC | PRN
  Filled 2022-07-16: qty 120, 10d supply, fill #0

## 2022-07-16 MED ORDER — HYDROCODONE-ACETAMINOPHEN 10-325 MG PO TABS: 1.0000 | ORAL_TABLET | ORAL | 0 refills | Status: DC | PRN

## 2022-07-16 NOTE — Progress Notes (Signed)
START ON PATHWAY REGIMEN - Non-Small Cell Lung ? ? ?  A cycle is every 21 days: ?    Pembrolizumab  ? ?**Always confirm dose/schedule in your pharmacy ordering system** ? ?Patient Characteristics: ?Stage IV Metastatic, Nonsquamous, Molecular Analysis Completed, Molecular Alteration Present and Targeted Therapy Exhausted OR EGFR Exon 20+ or KRAS G12C+ or HER2+ Present and No Prior Chemo/Immunotherapy OR No Alteration Present, Initial  ?Chemotherapy/Immunotherapy, PS = 0, 1, No Alteration Present, No Alteration Present, Candidate for Immunotherapy, PD-L1 Expression Positive  ? 50% (TPS) and Immunotherapy Candidate ?Therapeutic Status: Stage IV Metastatic ?Histology: Nonsquamous Cell ?Broad Molecular Profiling Status: Molecular Analysis Completed ?Molecular Analysis Results: No Alteration Present ?ECOG Performance Status: 1 ?Chemotherapy/Immunotherapy Line of Therapy: Initial Chemotherapy/Immunotherapy ?EGFR Exons 18-21 Mutation Testing Status: Completed and Negative ?ALK Fusion/Rearrangement Testing Status: Completed and Negative ?BRAF V600 Mutation Testing Status: Completed and Negative ?KRAS G12C Mutation Testing Status: Completed and Negative ?MET Exon 14 Mutation Testing Status: Completed and Negative ?RET Fusion/Rearrangement Testing Status: Completed and Negative ?HER2 Mutation Testing Status: Completed and Negative ?NTRK Fusion/Rearrangement Testing Status: Completed and Negative ?ROS1 Fusion/Rearrangement Testing Status: Completed and Negative ?Immunotherapy Candidate Status: Candidate for Immunotherapy ?PD-L1 Expression Status: PD-L1 Positive ? 50% (TPS) ?Intent of Therapy: ?Non-Curative / Palliative Intent, Discussed with Patient ?

## 2022-07-16 NOTE — Patient Instructions (Addendum)
[place] [facility] Discharge Instructions   You were seen and examined today by Dr. Delton Coombes.  He discussed with you the results of the biopsy we did on your shoulder blade. This is showing that the lung cancer in your left lung has spread to your shoulder blade.   Since the cancer has spread to the bone, this is considered a Stage IV disease, which means we are unable to cure this cancer. But the special testing we sent on your biopsy shows that you would greatly benefit from immunotherapy. This is therapy that targets the cancer cells specifically. It is NOT chemotherapy. The immunotherapy drug he will give you is called Keytruda. You come to the clinic one day every 3 weeks to receive treatment.   We will refer you to Dr. Isidore Moos for radiation to the lesion on your shoulder blade.   We will refer you for port placement.   We will plan to start you as soon as your insurance approves.    Thank you for choosing [place] at [facility] to provide your oncology and hematology care.  To afford each patient quality time with our provider, please arrive at least 15 minutes before your scheduled appointment time.   If you have a lab appointment with the [facility] please come in thru the Main Entrance and check in at the main information desk.  You need to re-schedule your appointment should you arrive 10 or more minutes late.  We strive to give you quality time with our providers, and arriving late affects you and other patients whose appointments are after yours.  Also, if you no show three or more times for appointments you may be dismissed from the clinic at the providers discretion.     Again, thank you for choosing [facility].  Our hope is that these requests will decrease the amount of time that you wait before being seen by our physicians.       _____________________________________________________________  Should you  have questions after your visit to [facility], please contact our office at [phone] and follow the prompts.  Our office hours are 8:00 a.m. and 4:30 p.m. Monday - Friday.  Please note that voicemails left after 4:00 p.m. may not be returned until the following business day.  We are closed weekends and major holidays.  You do have access to a nurse 24-7, just call the main number to the clinic [phone] and do not press any options, hold on the line and a nurse will answer the phone.    For prescription refill requests, have your pharmacy contact our office and allow 72 hours.    Due to Covid, you will need to wear a mask upon entering the hospital. If you do not have a mask, a mask will be given to you at the Main Entrance upon arrival. For doctor visits, patients may have 1 support person age 18 or older with them. For treatment visits, patients can not have anyone with them due to social distancing guidelines and our immunocompromised population.

## 2022-07-17 ENCOUNTER — Other Ambulatory Visit: Payer: Self-pay

## 2022-07-17 NOTE — Progress Notes (Signed)
Pharmacist Chemotherapy Monitoring - Initial Assessment    Anticipated start date: 07/21/22   The following has been reviewed per standard work regarding the patient's treatment regimen: The patient's diagnosis, treatment plan and drug doses, and organ/hematologic function Lab orders and baseline tests specific to treatment regimen  The treatment plan start date, drug sequencing, and pre-medications Prior authorization status  Patient's documented medication list, including drug-drug interaction screen and prescriptions for anti-emetics and supportive care specific to the treatment regimen The drug concentrations, fluid compatibility, administration routes, and timing of the medications to be used The patient's access for treatment and lifetime cumulative dose history, if applicable  The patient's medication allergies and previous infusion related reactions, if applicable   Changes made to treatment plan:  N/A  Follow up needed:  N/A   Wynona Neat, [facility], 07/17/2022  12:14 PM

## 2022-07-18 ENCOUNTER — Encounter (HOSPITAL_COMMUNITY): Payer: Self-pay | Admitting: Hematology

## 2022-07-18 NOTE — Progress Notes (Signed)
Maurice Orozco, Maurice Orozco [id number]   CLINIC:  Medical Oncology/Hematology  PCP:  Maurice Orozco, Maurice Orozco / Maurice Orozco [place] [address] [phone]   REASON FOR VISIT:  Follow-up for history of bilateral lung cancers, normocytic anemia, and B12 deficiency  PRIOR THERAPY: - Right upper lobectomy (February 2012) for right lung cancer - SBRT (November 2015) for stage I left adenocarcinoma  NGS Results: Not done  CURRENT THERAPY: Surveillance  BRIEF ONCOLOGIC HISTORY:  Oncology History  Adenocarcinoma of lung ([place])  09/30/2010 Cancer Staging   CT of chest showed spiculated lesion   10/15/2010 Surgery   Right upper lobectomy by Dr. Arlyce Dice   10/16/2010 Remission     10/24/2013 Imaging   CT Chest-  Enlarging subpleural nodule in the left lower lobe, now 9 mm, with distortion of the overlying pleura. Finding is concerning for a small bronchogenic carcinoma   01/23/2014 Imaging   CT Chest- Left lower lobe nodule has enlarged from 11/01/2012 and is worrisome for primary bronchogenic carcinoma.    07/04/2014 - 07/10/2014 Radiation Therapy   SBRT by Dr. Isidore Moos in 3 fractions.   07/05/2015 Imaging   perifissural nodule in LLL appears slightly smaller with parenchymal opacity attrib to XRT, likely inflamm pathcy airspace dz more inferiorly in LLL, airspace dz in RLL resolved. no metastatic disease   Primary cancer of left lower lobe of lung ([place])  06/18/2014 Initial Diagnosis   Left lower lobe nodule suspicious for primary lung carcinoma   07/04/2014 - 07/10/2014 Radiation Therapy   SBRT   10/12/2014 Imaging   No acute findings within the chest. Decrease in size of left lower lobe perifissural nodule compatible with response to therapy. No new or progressive disease identified within the chest.   Adenocarcinoma of upper lobe of left lung ([place])  07/16/2022 Initial Diagnosis   Adenocarcinoma of upper lobe of left lung ([place])   07/21/2022 -   Chemotherapy   Patient is on Treatment Plan : LUNG NSCLC Pembrolizumab (200) q21d       CANCER STAGING:  Cancer Staging  Adenocarcinoma of lung ([place]) Staging form: Lung, AJCC 7th Edition - Clinical: Stage IA (T1a, N0, M0) - Signed by Baird Cancer, PA on 05/06/2011  Adenocarcinoma of upper lobe of left lung ([place]) Staging form: Lung, AJCC 8th Edition - Clinical stage from 07/16/2022: Stage IVA (cT1b, cN0, pM1b) - Unsigned   INTERVAL HISTORY:  [name], a 76 y.o. male, seen for follow-up of metastatic lung cancer.  He reports pain in the scapular region rated as 8 out of 10.  He is taking hydrocodone 1 to 2 tablets every 4-6 hours as needed.  He still has severe pain.  REVIEW OF SYSTEMS:    Review of Systems  Respiratory:  Positive for cough and shortness of breath (COPD). Negative for hemoptysis.   Gastrointestinal:  Positive for nausea.  Skin: Negative.   Neurological:  Negative for dizziness, headaches and light-headedness.  Hematological:  Does not bruise/bleed easily.    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 dense RCA calcification --Non-ischemic Myoview   History of kidney stones    in the 70's   History of radiation therapy 07/04/14, 07/06/14, 07/10/14   LLL lung nodule/54 Gy/3 fx- SBRT   Hypercholesterolemia 05/06/2011   Pneumonia due to Streptococcus    HX. POST OPERATIVELY   Pre-diabetes    Stress fx pelvis    DUE TO AA IN 1970   Past Surgical History:   Procedure Laterality Date   BACK SURGERY  07/04/2017   fusion above previous surgery   BACK SURGERY  2011   metal plate l-4,L5   BRONCHIAL BIOPSY  06/03/2022   Procedure: BRONCHIAL BIOPSIES;  Surgeon: Garner Nash, DO;  Location: [facility];  Service: Pulmonary;;   BRONCHIAL BRUSHINGS  06/03/2022   Procedure: BRONCHIAL BRUSHINGS;  Surgeon: Garner Nash, DO;  Location: [facility];  Service: Pulmonary;;   BRONCHIAL NEEDLE ASPIRATION BIOPSY  06/03/2022   Procedure: BRONCHIAL NEEDLE ASPIRATION BIOPSIES;  Surgeon: Garner Nash, DO;  Location: [facility];  Service: Pulmonary;;   Coroanry Calcium Score  03/2017   1043. Noted especially in RCA. Dense calcification, recommend Myoview over Cor CTA.   FIDUCIAL MARKER PLACEMENT  06/03/2022   Procedure: FIDUCIAL MARKER PLACEMENT;  Surgeon: Garner Nash, DO;  Location: [facility];  Service: Pulmonary;;   LIPOMA EXCISION N/A 10/14/2017   Procedure: EXCISION LIPOMA ON BACK;  Surgeon: Virl Cagey, MD;  Location: [facility];  Service: General;  Laterality: N/A;   LIPOMA EXCISION Left 11/19/2018   Procedure: MINOR EXCISION OF SEBACEOUS CYST NECK;  Surgeon: Virl Cagey, MD;  Location: [facility];  Service: General;  Laterality: Left;  pt knows to arrive at 7:00   LUNG LOBECTOMY  2012   RT [place]  04/2017   EF 60%. Hypertensive response to exercise. (6:21 min, 7.7 METs) No EKG changes. LOW RISK    SOCIAL HISTORY:  Social History   Socioeconomic History   Marital status: Married    Spouse name: Not on file   Number of children: 3   Years of education: Not on file   Highest education level: Not on file  Occupational History   Not on file  Tobacco Use   Smoking status: Former    Packs/day: 1.50    Years: 59.00    Total pack years: 88.50    Types: Cigarettes    Quit date: 09/08/2010    Years since quitting: 11.8   Smokeless tobacco: Current    Types: Chew   Tobacco comments:    still chews tobacco, he  tells me he hasn't in the past 2 days.   Vaping Use   Vaping Use: Never used  Substance and Sexual Activity   Alcohol use: No   Drug use: No   Sexual activity: Not Currently  Other Topics Concern   Not on file  Social History Narrative   Not on file   Social Determinants of Health   Financial Resource Strain: Not on file  Food Insecurity: Not on file  Transportation Needs: No Transportation Needs (07/13/2018)   PRAPARE - Hydrologist (Medical): No    Lack of Transportation (Non-Medical): No  Physical Activity: Not on file  Stress: Not on file  Social Connections: Not on file  Intimate Partner Violence: Not At Risk (07/13/2018)   Humiliation, Afraid, Rape, and Kick questionnaire    Fear of Current or Ex-Partner: No    Emotionally Abused: No    Physically Abused: No    Sexually Abused: No    FAMILY HISTORY:  Family History  Problem Relation Age of Onset   Hypertension Mother    Kidney disease Father    Heart attack Brother    Cancer Maternal Uncle        prostate cancer   Cancer Maternal Uncle        prostate cancer   Cancer Maternal Uncle        prostate cancer   Cancer Maternal Uncle        prostate cancer    CURRENT MEDICATIONS:  Current Outpatient Medications  Medication Sig Dispense Refill   albuterol (PROVENTIL HFA;VENTOLIN HFA) 108 (90 Base) MCG/ACT inhaler Inhale 2 puffs into the lungs every 4 (four) hours as needed for wheezing or shortness of breath. 2 Inhaler 3   albuterol (PROVENTIL) (2.5 MG/3ML) 0.083% nebulizer solution Take 2.5 mg by nebulization every 6 (six) hours as needed for wheezing or shortness of breath.     cholecalciferol (VITAMIN D3) 25 MCG (1000 UNIT) tablet Take 1,000 Units by mouth daily.     cimetidine (TAGAMET) 200 MG tablet Take 400-600 mg by mouth daily as needed (for heartburn).     fluticasone (FLONASE) 50 MCG/ACT nasal spray Place 2 sprays into both nostrils daily as needed for rhinitis.     guaiFENesin  (MUCINEX) 600 MG 12 hr tablet Take 600 mg by mouth daily.     hydrochlorothiazide (HYDRODIURIL) 25 MG tablet Take 12.5 mg by mouth daily.     Menthol, Topical Analgesic, (ICY HOT EX) Apply 1 Application topically daily as needed (pain).     naproxen sodium (ALEVE) 220 MG tablet Take 220 mg by mouth daily as needed (pain).     Omega-3 Fatty Acids (FISH OIL) 1000 MG CAPS Take 1,000 mg by mouth daily.     pantoprazole (PROTONIX) 40 MG tablet Take 40 mg by mouth See admin instructions. Take 40 mg daily, may take a second 40 mg dose as needed for heartburn     polyethylene glycol powder (GLYCOLAX/MIRALAX) 17 GM/SCOOP powder Take 17 g by mouth daily as needed for mild constipation or moderate constipation.     triamcinolone cream (KENALOG) 0.1 % Apply 1 Application topically daily as needed (tick bite/itching).     triazolam (HALCION) 0.25 MG tablet Take 0.25 mg by mouth at bedtime.     vitamin B-12 (CYANOCOBALAMIN) 1000 MCG tablet Take 1,000 mcg by mouth every other day.     ferrous sulfate 325 (65 FE) MG tablet Take 325 mg by mouth daily with breakfast.     HYDROcodone-acetaminophen (NORCO) 10-325 MG tablet Take 1-2 tablets by mouth every 4 (four) hours as needed for moderate pain. 120 tablet 0   Oxycodone HCl 10 MG TABS Take 10 mg by mouth 2 (two) times daily as needed (pain).     rosuvastatin (CRESTOR) 40 MG tablet Take 40 mg by mouth at bedtime.     No current facility-administered medications for this visit.    ALLERGIES:  Allergies  Allergen Reactions   Fentanyl Other (See Comments)    CAUSED HALLUCINATIONS/05-03-13 pt reports it was oral fentanyl that caused hallucinations    Gabapentin Other (See Comments)    Bones throbbed, headaches, weakness   Nabumetone     Unknown reaction  Aspirin Nausea Only    PHYSICAL EXAM:    Performance status (ECOG): 1 - Symptomatic but completely ambulatory  Vitals:   07/16/22 1140  BP: (!) 176/81  Pulse: 78  Resp: 18  Temp: 97.9 F (36.6 C)   SpO2: 96%   Wt Readings from Last 3 Encounters:  07/16/22 142 lb 3.2 oz (64.5 kg)  07/03/22 144 lb (65.3 kg)  06/24/22 143 lb 12.8 oz (65.2 kg)   Physical Exam Constitutional:      Appearance: Normal appearance.  HENT:     Head: Normocephalic and atraumatic.     Mouth/Throat:     Mouth: Mucous membranes are moist.  Eyes:     Extraocular Movements: Extraocular movements intact.     Pupils: Pupils are equal, round, and reactive to light.  Cardiovascular:     Rate and Rhythm: Normal rate and regular rhythm.     Pulses: Normal pulses.     Heart sounds: Normal heart sounds.  Pulmonary:     Effort: Pulmonary effort is normal.     Breath sounds: Decreased breath sounds present.     Comments: Coarse breath sounds and decreased air movement in all lung fields. Abdominal:     General: Bowel sounds are normal.     Palpations: Abdomen is soft.     Tenderness: There is no abdominal tenderness.  Musculoskeletal:        General: No swelling.     Right lower leg: No edema.     Left lower leg: No edema.  Lymphadenopathy:     Cervical: No cervical adenopathy.  Skin:    General: Skin is warm and dry.  Neurological:     General: No focal deficit present.     Mental Status: He is alert and oriented to person, place, and time.  Psychiatric:        Mood and Affect: Mood normal.        Behavior: Behavior normal.      LABORATORY DATA:  I have reviewed the labs as listed.     Latest Ref Rng & Units 07/03/2022    6:30 AM 06/03/2022    9:16 AM 04/14/2022    2:05 PM  CBC  WBC 4.0 - 10.5 K/uL 7.9  10.0  8.7   Hemoglobin 13.0 - 17.0 g/dL 14.0  14.0  12.6   Hematocrit 39.0 - 52.0 % 42.0  42.0  38.0   Platelets 150 - 400 K/uL 300  245  214       Latest Ref Rng & Units 06/03/2022    9:16 AM 04/14/2022    5:30 PM 12/20/2020    2:41 PM  CMP  Glucose 70 - 99 mg/dL 133   110   BUN 8 - 23 mg/dL 8   20   Creatinine 0.61 - 1.24 mg/dL 0.81  1.00  1.09   Sodium 135 - 145 mmol/L 135   135    Potassium 3.5 - 5.1 mmol/L 3.0   3.9   Chloride 98 - 111 mmol/L 102   97   CO2 22 - 32 mmol/L 21   27   Calcium 8.9 - 10.3 mg/dL 9.2   8.8   Total Protein 6.5 - 8.1 g/dL   7.3   Total Bilirubin 0.3 - 1.2 mg/dL   0.5   Alkaline Phos 38 - 126 U/L   57   AST 15 - 41 U/L   17   ALT 0 - 44 U/L   15  DIAGNOSTIC IMAGING:  I have independently reviewed the scans and discussed with the patient. CT BONE TROCAR/NEEDLE BIOPSY DEEP  Result Date: 07/03/2022 INDICATION: 76 year old with left lung cancer and suspicious lytic lesion in the right scapula. EXAM: CT-guided biopsy of right scapular bone lesion MEDICATIONS: Moderate sedation ANESTHESIA/SEDATION: Moderate (conscious) sedation was employed during this procedure. A total of Versed 1.0 mg and Dilaudid 1 mg was administered intravenously by the radiology nurse. Total intra-service moderate Sedation Time: 10 minutes. The patient's level of consciousness and vital signs were monitored continuously by radiology nursing throughout the procedure under my direct supervision. FLUOROSCOPY TIME:  None COMPLICATIONS: None immediate. PROCEDURE: Informed written consent was obtained from the patient after a thorough discussion of the procedural risks, benefits and alternatives. All questions were addressed. A timeout was performed prior to the initiation of the procedure. Patient was placed prone on the CT scanner. Images through the chest were obtained. The lytic lesion involving the inferior right scapula was identified. The overlying skin was prepped with chlorhexidine and sterile field was created. Using CT guidance, 17 gauge coaxial needle was directed into the lytic bone lesion. Multiple core biopsies were obtained with an 18 gauge core device. Specimens placed in formalin. 17 gauge coaxial needle was removed. Bandage placed over the puncture site. FINDINGS: Destructive lytic lesion involving the inferior right scapula measuring up to 2.2 cm. Biopsy needle was  confirmed within the lesion. Adequate specimens were obtained and placed in formalin. IMPRESSION: CT-guided core biopsy of the right scapular bone lesion. Electronically Signed   By: Markus Daft M.D.   On: 07/03/2022 09:27     ASSESSMENT:  1.  History of bilateral lung cancer: - Stage I left lung adenocarcinoma, status post SBRT on 07/10/2014. - Status post right upper lobectomy in February 2012 for right lung cancer. - LDCT chest (04/14/2022): Enlarging spiculated nodular soft tissue thickening in the posterior left upper lobe measuring 11.4 mm. - PET scan (04/24/2022): Left upper lobe lung nodule 14 mm with SUV 3.62.  Hypermetabolic focus in the inferior aspect of the right scapula without discrete CT correlate with SUV 5.71.  No hypermetabolic adenopathy. - LUL lesion FNA (06/03/2022): Adenocarcinoma - Right scapula biopsy (07/03/2022) adenocarcinoma consistent with lung primary - NGS: PD-L1 22 C3 TPS 100%, T p53 pathogenic variant, MSI-stable, TMB-low, no other targetable mutations    PLAN:   1.  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 - I have reviewed NGS results.  PD-L1 TPS is very high indicating good response to immunotherapy. - We talked about immunotherapy with pembrolizumab and side effects in detail. - We will request port placement for initiation of immunotherapy.   2.  Right scapular pain: - He is currently taking hydrocodone 5/325 1 to 2 tablets every 4-6 hours as needed. - She still has pain. - We will make referral to Dr. Isidore Moos for palliative radiation for pain control.   3.  B12 deficiency - Continue B12 tablet every other day and iron tablet daily.   4.  Cystic lesion of left kidney - CTA PE on 04/14/2022 showed complex cystic lesion of the left kidney.  PET scan also showed exophytic 18 mm left renal lesion suspicious for  malignancy. - MRI abdomen on 05/16/2022 showed benign appearing Bosniak category 1 and 2 left renal cyst with no evidence of neoplasm.  No further work-up needed.  All questions were answered. The patient knows to call the clinic with any problems, questions or concerns.    Derek Jack, MD

## 2022-07-20 ENCOUNTER — Encounter (INDEPENDENT_AMBULATORY_CARE_PROVIDER_SITE_OTHER): Payer: Self-pay | Admitting: Gastroenterology

## 2022-07-21 ENCOUNTER — Inpatient Hospital Stay: Payer: Medicare Other

## 2022-07-21 ENCOUNTER — Encounter: Payer: Self-pay | Admitting: Hematology

## 2022-07-21 ENCOUNTER — Other Ambulatory Visit (HOSPITAL_BASED_OUTPATIENT_CLINIC_OR_DEPARTMENT_OTHER): Payer: Self-pay

## 2022-07-21 VITALS — BP 118/52 | HR 61 | Temp 97.3°F | Resp 18 | Ht 66.0 in | Wt 143.0 lb

## 2022-07-21 DIAGNOSIS — Z5112 Encounter for antineoplastic immunotherapy: Secondary | ICD-10-CM | POA: Diagnosis not present

## 2022-07-21 DIAGNOSIS — Z87891 Personal history of nicotine dependence: Secondary | ICD-10-CM | POA: Diagnosis not present

## 2022-07-21 DIAGNOSIS — Z95828 Presence of other vascular implants and grafts: Secondary | ICD-10-CM

## 2022-07-21 DIAGNOSIS — C3412 Malignant neoplasm of upper lobe, left bronchus or lung: Secondary | ICD-10-CM

## 2022-07-21 DIAGNOSIS — C7951 Secondary malignant neoplasm of bone: Secondary | ICD-10-CM | POA: Diagnosis not present

## 2022-07-21 DIAGNOSIS — Z79899 Other long term (current) drug therapy: Secondary | ICD-10-CM | POA: Diagnosis not present

## 2022-07-21 DIAGNOSIS — E538 Deficiency of other specified B group vitamins: Secondary | ICD-10-CM | POA: Diagnosis not present

## 2022-07-21 HISTORY — DX: Presence of other vascular implants and grafts: Z95.828

## 2022-07-21 LAB — COMPREHENSIVE METABOLIC PANEL
ALT: 17 U/L (ref 0–44)
AST: 17 U/L (ref 15–41)
Albumin: 3.8 g/dL (ref 3.5–5.0)
Alkaline Phosphatase: 57 U/L (ref 38–126)
Anion gap: 6 (ref 5–15)
BUN: 13 mg/dL (ref 8–23)
CO2: 26 mmol/L (ref 22–32)
Calcium: 8.7 mg/dL — ABNORMAL LOW (ref 8.9–10.3)
Chloride: 105 mmol/L (ref 98–111)
Creatinine, Ser: 0.77 mg/dL (ref 0.61–1.24)
GFR, Estimated: >60 mL/min (ref >60)
Glucose, Bld: 118 mg/dL — ABNORMAL HIGH (ref 70–99)
Potassium: 3.6 mmol/L (ref 3.5–5.1)
Sodium: 137 mmol/L (ref 135–145)
Total Bilirubin: 0.6 mg/dL (ref 0.3–1.2)
Total Protein: 6.8 g/dL (ref 6.5–8.1)

## 2022-07-21 LAB — TSH: TSH: 0.948 u[IU]/mL (ref 0.350–4.500)

## 2022-07-21 LAB — CBC WITH DIFFERENTIAL/PLATELET
Abs Immature Granulocytes: 0.03 10*3/uL (ref 0.00–0.07)
Basophils Absolute: 0.1 10*3/uL (ref 0.0–0.1)
Basophils Relative: 1 %
Eosinophils Absolute: 0.2 10*3/uL (ref 0.0–0.5)
Eosinophils Relative: 3 %
HCT: 38.7 % — ABNORMAL LOW (ref 39.0–52.0)
Hemoglobin: 13 g/dL (ref 13.0–17.0)
Immature Granulocytes: 0 %
Lymphocytes Relative: 13 %
Lymphs Abs: 1 10*3/uL (ref 0.7–4.0)
MCH: 33.8 pg (ref 26.0–34.0)
MCHC: 33.6 g/dL (ref 30.0–36.0)
MCV: 100.5 fL — ABNORMAL HIGH (ref 80.0–100.0)
Monocytes Absolute: 0.5 10*3/uL (ref 0.1–1.0)
Monocytes Relative: 6 %
Neutro Abs: 6.2 10*3/uL (ref 1.7–7.7)
Neutrophils Relative %: 77 %
Platelets: 231 10*3/uL (ref 150–400)
RBC: 3.85 MIL/uL — ABNORMAL LOW (ref 4.22–5.81)
RDW: 12.2 % (ref 11.5–15.5)
WBC: 7.9 10*3/uL (ref 4.0–10.5)
nRBC: 0 % (ref 0.0–0.2)

## 2022-07-21 MED ORDER — PROCHLORPERAZINE MALEATE 10 MG PO TABS
10.0000 mg | ORAL_TABLET | Freq: Four times a day (QID) | ORAL | 1 refills | Status: DC | PRN
  Filled 2022-07-21: qty 30, 8d supply, fill #0

## 2022-07-21 MED ORDER — SODIUM CHLORIDE 0.9 % IV SOLN: Freq: Once | INTRAVENOUS | Status: AC

## 2022-07-21 MED ORDER — SODIUM CHLORIDE 0.9 % IV SOLN
200.0000 mg | Freq: Once | INTRAVENOUS | Status: AC
  Administered 2022-07-21: 200 mg via INTRAVENOUS
  Filled 2022-07-21: qty 8

## 2022-07-21 MED ORDER — LIDOCAINE-PRILOCAINE 2.5-2.5 % EX CREA: TOPICAL_CREAM | CUTANEOUS | 3 refills | Status: DC

## 2022-07-21 NOTE — Progress Notes (Signed)
Patient presents today for Keytruda infusion.  Patient is in satisfactory condition with no complaints voiced.  Vital signs are stable.  Labs reviewed.  All labs are within treatment parameters.  We will proceed with treatment per MD orders.   Patient tolerated treatment well with no complaints voiced.  Patient left ambulatory in stable condition.  Vital signs stable at discharge.  Follow up as scheduled.

## 2022-07-21 NOTE — Patient Instructions (Addendum)
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 We will treat you in the clinic every 3 weeks with an immunotherapy drug called Keytruda. The intent of treatment is to control this cancer, prevent it from spreading further, and to alleviate any symptoms you may be having related to the cancer. You will see the doctor regularly throughout treatment.  We will obtain blood work from you prior to every treatment and monitor your results to make sure it is safe to give your treatment. The doctor monitors your response to treatment by the way you are feeling, your blood work, and by obtaining scans periodically. There will be wait times while you are here for treatment.  It will take about 30 minutes to 1 hour for your lab work to result.  Then there will be wait times while pharmacy mixes your medications.    Pembrolizumab Beryle Flock)  About This Drug Pembrolizumab is used to treat cancer. It is given in the vein (IV).  This drug will take 30 minutes to infuse.  Possible Side Effects  Tiredness  Fever  Nausea  Decreased appetite (decreased hunger)  Loose bowel movements (diarrhea)  Constipation (not able to move bowels)  Trouble breathing  Rash  Itching  Muscle and bone pain  Cough  Note: Each of the side effects above was reported in 20% or greater of patients treated with pembrolizumab. Not all possible side effects are included above.  Warnings and Precautions   This drug works with your immune system and can cause inflammation in any of your organs and tissues and can change how they work. This may put you at risk for developing serious medical problems which can very rarely be fatal.   Colitis (swelling or inflammation in the colon) - symptoms are loose bowel movements (diarrhea) stomach cramping, and sometimes blood in the stool   Changes in liver function. Your liver function will be checked as  needed.   Changes in kidney function, which can very rarely be fatal. Your kidney function will be checked as needed.   Inflammation (swelling) of the lungs which can very rarely be fatal - you may have a dry cough or trouble breathing.   This drug may affect some of your hormone glands (especially the thyroid, adrenals, pituitary and pancreas). Your hormone levels will be checked as needed.   Blood sugar levels may change and you may develop diabetes. If you already have diabetes, changes may need to be made to your diabetes medication.   Severe allergic skin reaction, which can very rarely be fatal. You may develop blisters on your skin that are filled with fluid or a severe red rash all over your body that may be painful.   Increased risk of organ rejection in patients who have received donor organs   Increased risk of complications in patients who will undergo a stem cell transplant after receiving pembrolizumab.   While you are getting this drug in your vein (IV), you may have a reaction to the drug. Your nurse will check you closely for these signs: fever or shaking chills, flushing, facial swelling, feeling dizzy, headache, trouble breathing, rash, itching, chest tightness, or chest pain. These reactions may occur after your infusion. If this happens, call 911 for emergency care.  Important Information This drug may be present in the saliva, tears, sweat, urine, stool, vomit, semen, and vaginal secretions. Talk to your doctor and/or your nurse  about the necessary precautions to take during this time.  Treating Side Effects   Ask your doctor or nurse about medicines that are available to help stop or lessen constipation, diarrhea and/or nausea.   Drink plenty of fluids (a minimum of eight glasses per day is recommended).   If you are not able to move your bowels, check with your doctor or nurse before you use any enemas, laxatives, or suppositories   To help with nausea and  vomiting, eat small, frequent meals instead of three large meals a day. Choose foods and drinks that are at room temperature. Ask your nurse or doctor about other helpful tips and medicine that is available to help or stop lessen these symptoms.   If you get diarrhea, eat low-fiber foods that are high in protein and calories and avoid foods that can irritate your digestive tracts or lead to cramping. Ask your nurse or doctor about medicine that can lessen or stop your diarrhea.   Manage tiredness by pacing your activities for the day. Be sure to include periods of rest between energy-draining activities   Keeping your pain under control is important to your wellbeing. Please tell your doctor or nurse if you are experiencing pain.   If you have diabetes, keep good control of your blood sugar level. Tell your nurse or your doctor if your glucose levels are higher or lower than normal   If you get a rash do not put anything on it unless your doctor or nurse says you may. Keep the area around the rash clean and dry. Ask your doctor for medicine if your rash bothers you.   Infusion reactions may happen for 24 hours after your infusion. If this happens, call 911 for emergency care.  Food and Drug Interactions   There are no known interactions of pembrolizumab with food.   There are no known interactions of pembrolizumab with other medications.   Tell your doctor and pharmacist about all the medicines and dietary supplements (vitamins, minerals, herbs and others) that you are taking at this time. The safety and use of dietary supplements and alternative agents are often not known. Using these might affect your cancer or interfere with your treatment. Until more is known, you should not use dietary supplements or alternative agents without your cancer doctor's help.   When to Call the Doctor Call your doctor or nurse if you have any of the following symptoms and/or any new or unusual symptoms:    Fever of 100.4 F (38 C) or higher   Chills   Wheezing or trouble breathing   Rash or itching   Feeling dizzy or lightheaded   Loose bowel movements (diarrhea) more than 4 times a day or diarrhea with weakness or lightheadedness, or diarrhea that is not controlled by medications   Nausea that stops you from eating or drinking, and/or that is not relieved by prescribed medicines   Lasting loss of appetite or rapid weight loss of five pounds in a week   Fatigue that interferes with your daily activities   No bowel movement for 3 days or you feel uncomfortable   Extreme weakness that interferes with normal activities   Bad abdominal pain, especially in upper right area   Decreased urine   Unusual thirst or passing urine often   Rash that is not relieved by prescribed medicines   Flu-like symptoms: fever, headache, muscle and joint aches, and fatigue (low energy, feeling weak)   Signs of liver problems: dark  urine, pale bowel movements, bad stomach pain, feeling very tired and weak, unusual itching, or yellowing of the eyes or skin   Signs of infusion reactions such as fever or shaking chills, flushing, facial swelling, feeling dizzy, headache, trouble breathing, rash, itching, chest tightness, or chest pain.   Reproduction Warnings   Pregnancy warning: This drug may have harmful effects on the unborn baby. Women of childbearing potential should use effective methods of birth control during your cancer treatment and for at least 4 months after treatment. Let your doctor know right away if you think you may be pregnant   Breast feeding warning: It is not known if this drug passes into breast milk. It is recommended that women do not breastfeed during treatment and for 4 months after treatment.   Fertility warning: Human fertility studies have not been done with this drug. Talk with your doctor or nurse if you plan to have children.   SELF CARE ACTIVITIES WHILE ON  CHEMOTHERAPY/IMMUNOTHERAPY:  Hydration Increase your fluid intake 48 hours prior to treatment and drink at least 8 to 12 cups (64 ounces) of water/decaffeinated beverages per day after treatment. You can still have your cup of coffee or soda but these beverages do not count as part of your 8 to 12 cups that you need to drink daily. No alcohol intake.  Medications Continue taking your normal prescription medication as prescribed.  If you start any new herbal or new supplements please let us know first to make sure it is safe.  Mouth Care Have teeth cleaned professionally before starting treatment. Keep dentures and partial plates clean. Use soft toothbrush and do not use mouthwashes that contain alcohol. Biotene is a good mouthwash that is available at most pharmacies or may be ordered by calling [phone]. Use warm salt water gargles (1 teaspoon salt per 1 quart warm water) before and after meals and at bedtime. Or you may rinse with 2 tablespoons of three-percent hydrogen peroxide mixed in eight ounces of water. If you are still having problems with your mouth or sores in your mouth please call the clinic. If you need dental work, please let the doctor know before you go for your appointment so that we can coordinate the best possible time for you in regards to your chemo regimen. You need to also let your dentist know that you are actively taking chemo. We may need to do labs prior to your dental appointment.  Skin Care Always use sunscreen that has not expired and with SPF (Sun Protection Factor) of 50 or higher. Wear hats to protect your head from the sun. Remember to use sunscreen on your hands, ears, face, & feet.  Use good moisturizing lotions such as udder cream, eucerin, or even Vaseline. Some chemotherapies can cause dry skin, color changes in your skin and nails.    Avoid long, hot showers or baths. Use gentle, fragrance-free soaps and laundry detergent. Use moisturizers, preferably  creams or ointments rather than lotions because the thicker consistency is better at preventing skin dehydration. Apply the cream or ointment within 15 minutes of showering. Reapply moisturizer at night, and moisturize your hands every time after you wash them.   Infection Prevention Please wash your hands for at least 30 seconds using warm soapy water. Handwashing is the #1 way to prevent the spread of germs. Stay away from sick people or people who are getting over a cold. If you develop respiratory systems such as green/yellow mucus production or productive cough  or persistent cough let us know and we will see if you need an antibiotic. It is a good idea to keep a pair of gloves on when going into grocery stores/[employer] to decrease your risk of coming into contact with germs on the carts, etc. Carry alcohol hand gel with you at all times and use it frequently if out in public. If your temperature reaches 100.5 or higher please call the clinic and let us know.  If it is after hours or on the weekend please go to the ER if your temperature is over 100.4.  Please have your own personal thermometer at home to use.    Sex and bodily fluids If you are going to have sex, a condom must be used to protect the person that isn't taking immunotherapy. For a few days after treatment, immunotherapy can be excreted through your bodily fluids.  When using the toilet please close the lid and flush the toilet twice.  Do this for a few day after you have had immunotherapy.   Contraception It is not known for sure whether or not immunotherapy drugs can be passed on through semen or secretions from the vagina. Because of this some doctors advise people to use a barrier method if you have sex during treatment. This applies to vaginal, anal or oral sex.  Generally, doctors advise a barrier method only for the time you are actually having the treatment and for about a week after your treatment.  Advice like this can be  worrying, but this does not mean that you have to avoid being intimate with your partner. You can still have close contact with your partner and continue to enjoy sex.  Animals If you have cats or birds we just ask that you not change the litter or change the cage.  Please have someone else do this for you while you are on immunotherapy.   Food Safety During and After Cancer Treatment Food safety is important for people both during and after cancer treatment. Cancer and cancer treatments, such as chemotherapy, radiation therapy, and stem cell/bone marrow transplantation, often weaken the immune system. This makes it harder for your body to protect itself from foodborne illness, also called food poisoning. Foodborne illness is caused by eating food that contains harmful bacteria, parasites, or viruses.  Foods to avoid Some foods have a higher risk of becoming tainted with bacteria. These include: Unwashed fresh fruit and vegetables, especially leafy vegetables that can hide dirt and other contaminants Raw sprouts, such as alfalfa sprouts Raw or undercooked beef, especially ground beef, or other raw or undercooked meat and poultry Fatty, fried, or spicy foods immediately before or after treatment.  These can sit heavy on your stomach and make you feel nauseous. Raw or undercooked shellfish, such as oysters. Sushi and sashimi, which often contain raw fish.  Unpasteurized beverages, such as unpasteurized fruit juices, raw milk, raw yogurt, or cider Undercooked eggs, such as soft boiled, over easy, and poached; raw, unpasteurized eggs; or foods made with raw egg, such as homemade raw cookie dough and homemade mayonnaise  Simple steps for food safety  Shop smart. Do not buy food stored or displayed in an unclean area. Do not buy bruised or damaged fruits or vegetables. Do not buy cans that have cracks, dents, or bulges. Pick up foods that can spoil at the end of your shopping trip and store them  in a cooler on the way home.  Prepare and clean up foods carefully. Rinse  all fresh fruits and vegetables under running water, and dry them with a clean towel or paper towel. Clean the top of cans before opening them. After preparing food, wash your hands for 20 seconds with hot water and soap. Pay special attention to areas between fingers and under nails. Clean your utensils and dishes with hot water and soap. Disinfect your kitchen and cutting boards using 1 teaspoon of liquid, unscented bleach mixed into 1 quart of water.    Dispose of old food. Eat canned and packaged food before its expiration date (the "use by" or "best before" date). Consume refrigerated leftovers within 3 to 4 days. After that time, throw out the food. Even if the food does not smell or look spoiled, it still may be unsafe. Some bacteria, such as Listeria, can grow even on foods stored in the refrigerator if they are kept for too long.  Take precautions when eating out. At restaurants, avoid buffets and salad bars where food sits out for a long time and comes in contact with many people. Food can become contaminated when someone with a virus, often a norovirus, or another "bug" handles it. Put any leftover food in a "to-go" container yourself, rather than having the server do it. And, refrigerate leftovers as soon as you get home. Choose restaurants that are clean and that are willing to prepare your food as you order it cooked.    SYMPTOMS TO REPORT AS SOON AS POSSIBLE AFTER TREATMENT:  FEVER GREATER THAN 100.4 F  CHILLS WITH OR WITHOUT FEVER  NAUSEA AND VOMITING THAT IS NOT CONTROLLED WITH YOUR NAUSEA MEDICATION  UNUSUAL SHORTNESS OF BREATH  UNUSUAL BRUISING OR BLEEDING  TENDERNESS IN MOUTH AND THROAT WITH OR WITHOUT PRESENCE OF ULCERS  URINARY PROBLEMS  BOWEL PROBLEMS  UNUSUAL RASH     Wear comfortable clothing and clothing appropriate for easy access to any Portacath or PICC line. Let us know if  there is anything that we can do to make your therapy better!   What to do if you need assistance after hours or on the weekends: CALL [phone].  HOLD on the line, do not hang up.  You will hear multiple messages but at the end you will be connected with a nurse triage line.  They will contact the doctor if necessary.  Most of the time they will be able to assist you.  Do not call the hospital operator.    I have been informed and understand all of the instructions given to me and have received a copy. I have been instructed to call the clinic [phone] or my family physician as soon as possible for continued medical care, if indicated. I do not have any more questions at this time but understand that I may call the [facility] or the Patient Navigator at [phone] during office hours should I have questions or need assistance in obtaining follow-up care.

## 2022-07-21 NOTE — Patient Instructions (Signed)
[facility]  Discharge Instructions: Thank you for choosing [facility] to provide your oncology and hematology care.  If you have a lab appointment with the [facility], please come in thru the Main Entrance and check in at the main information desk.  Wear comfortable clothing and clothing appropriate for easy access to any Portacath or PICC line.   We strive to give you quality time with your provider. You may need to reschedule your appointment if you arrive late (15 or more minutes).  Arriving late affects you and other patients whose appointments are after yours.  Also, if you miss three or more appointments without notifying the office, you may be dismissed from the clinic at the provider's discretion.      For prescription refill requests, have your pharmacy contact our office and allow 72 hours for refills to be completed.    Today you received the following chemotherapy and/or immunotherapy agents Keytruda.  Pembrolizumab Injection What is this medication? PEMBROLIZUMAB (PEM broe LIZ ue mab) treats some types of cancer. It works by helping your immune system slow or stop the spread of cancer cells. It is a monoclonal antibody. This medicine may be used for other purposes; ask your health care provider or pharmacist if you have questions. COMMON BRAND NAME(S): Keytruda What should I tell my care team before I take this medication? They need to know if you have any of these conditions: Allogeneic stem cell transplant (uses someone else's stem cells) Autoimmune diseases, such as Crohn disease, ulcerative colitis, lupus History of chest radiation Nervous system problems, such as Guillain-Barre syndrome, myasthenia gravis Organ transplant An unusual or allergic reaction to pembrolizumab, other medications, foods, dyes, or preservatives Pregnant or trying to get pregnant Breast-feeding How should I use this medication? This medication is injected  into a vein. It is given by your care team in a hospital or clinic setting. A special MedGuide will be given to you before each treatment. Be sure to read this information carefully each time. Talk to your care team about the use of this medication in children. While it may be prescribed for children as young as 6 months for selected conditions, precautions do apply. Overdosage: If you think you have taken too much of this medicine contact a poison control center or emergency room at once. NOTE: This medicine is only for you. Do not share this medicine with others. What if I miss a dose? Keep appointments for follow-up doses. It is important not to miss your dose. Call your care team if you are unable to keep an appointment. What may interact with this medication? Interactions have not been studied. This list may not describe all possible interactions. Give your health care provider a list of all the medicines, herbs, non-prescription drugs, or dietary supplements you use. Also tell them if you smoke, drink alcohol, or use illegal drugs. Some items may interact with your medicine. What should I watch for while using this medication? Your condition will be monitored carefully while you are receiving this medication. You may need blood work while taking this medication. This medication may cause serious skin reactions. They can happen weeks to months after starting the medication. Contact your care team right away if you notice fevers or flu-like symptoms with a rash. The rash may be red or purple and then turn into blisters or peeling of the skin. You may also notice a red rash with swelling of the face, lips, or lymph  nodes in your neck or under your arms. Tell your care team right away if you have any change in your eyesight. Talk to your care team if you may be pregnant. Serious birth defects can occur if you take this medication during pregnancy and for 4 months after the last dose. You will need a  negative pregnancy test before starting this medication. Contraception is recommended while taking this medication and for 4 months after the last dose. Your care team can help you find the option that works for you. Do not breastfeed while taking this medication and for 4 months after the last dose. What side effects may I notice from receiving this medication? Side effects that you should report to your care team as soon as possible: Allergic reactions--skin rash, itching, hives, swelling of the face, lips, tongue, or throat Dry cough, shortness of breath or trouble breathing Eye pain, redness, irritation, or discharge with blurry or decreased vision Heart muscle inflammation--unusual weakness or fatigue, shortness of breath, chest pain, fast or irregular heartbeat, dizziness, swelling of the ankles, feet, or hands Hormone gland problems--headache, sensitivity to light, unusual weakness or fatigue, dizziness, fast or irregular heartbeat, increased sensitivity to cold or heat, excessive sweating, constipation, hair loss, increased thirst or amount of urine, tremors or shaking, irritability Infusion reactions--chest pain, shortness of breath or trouble breathing, feeling faint or lightheaded Kidney injury (glomerulonephritis)--decrease in the amount of urine, red or dark Tatem Holsonback urine, foamy or bubbly urine, swelling of the ankles, hands, or feet Liver injury--right upper belly pain, loss of appetite, nausea, light-colored stool, dark yellow or Damien Batty urine, yellowing skin or eyes, unusual weakness or fatigue Pain, tingling, or numbness in the hands or feet, muscle weakness, change in vision, confusion or trouble speaking, loss of balance or coordination, trouble walking, seizures Rash, fever, and swollen lymph nodes Redness, blistering, peeling, or loosening of the skin, including inside the mouth Sudden or severe stomach pain, bloody diarrhea, fever, nausea, vomiting Side effects that usually do not  require medical attention (report to your care team if they continue or are bothersome): Bone, joint, or muscle pain Diarrhea Fatigue Loss of appetite Nausea Skin rash This list may not describe all possible side effects. Call your doctor for medical advice about side effects. You may report side effects to FDA at 1-800-FDA-1088. Where should I keep my medication? This medication is given in a hospital or clinic. It will not be stored at home. NOTE: This sheet is a summary. It may not cover all possible information. If you have questions about this medicine, talk to your doctor, pharmacist, or health care provider.  2023 Elsevier/Gold Standard (2013-05-16 00:00:00)        To help prevent nausea and vomiting after your treatment, we encourage you to take your nausea medication as directed.  BELOW ARE SYMPTOMS THAT SHOULD BE REPORTED IMMEDIATELY: *FEVER GREATER THAN 100.4 F (38 C) OR HIGHER *CHILLS OR SWEATING *NAUSEA AND VOMITING THAT IS NOT CONTROLLED WITH YOUR NAUSEA MEDICATION *UNUSUAL SHORTNESS OF BREATH *UNUSUAL BRUISING OR BLEEDING *URINARY PROBLEMS (pain or burning when urinating, or frequent urination) *BOWEL PROBLEMS (unusual diarrhea, constipation, pain near the anus) TENDERNESS IN MOUTH AND THROAT WITH OR WITHOUT PRESENCE OF ULCERS (sore throat, sores in mouth, or a toothache) UNUSUAL RASH, SWELLING OR PAIN  UNUSUAL VAGINAL DISCHARGE OR ITCHING   Items with * indicate a potential emergency and should be followed up as soon as possible or go to the Emergency Department if any problems should occur.  Please show the CHEMOTHERAPY ALERT CARD or IMMUNOTHERAPY ALERT CARD at check-in to the Emergency Department and triage nurse.  Should you have questions after your visit or need to cancel or reschedule your appointment, please contact [facility] [phone]  and follow the prompts.  Office hours are 8:00 a.m. to 4:30 p.m. Monday - Friday. Please note  that voicemails left after 4:00 p.m. may not be returned until the following business day.  We are closed weekends and major holidays. You have access to a nurse at all times for urgent questions. Please call the main number to the clinic [phone] and follow the prompts.  For any non-urgent questions, you may also contact your provider using MyChart. We now offer e-Visits for anyone 72 and older to request care online for non-urgent symptoms. For details visit [email or web address].   Also download the MyChart app! Go to the app store, search "MyChart", open the app, select [facility], and log in with your MyChart username and password.  Masks are optional in the cancer centers. If you would like for your care team to wear a mask while they are taking care of you, please let them know. You may have one support person who is at least 75 years old accompany you for your appointments.

## 2022-07-22 ENCOUNTER — Other Ambulatory Visit: Payer: Self-pay | Admitting: Student

## 2022-07-22 LAB — T4: T4, Total: 6.7 ug/dL (ref 4.5–12.0)

## 2022-07-22 NOTE — Progress Notes (Signed)
Thoracic Location of Tumor / Histology: left lung adenocarcinoma   Patient presented:  INTERVAL HISTORY:  [name], a 76 y.o. male, seen for follow-up of metastatic lung cancer.  He reports pain in the scapular region rated as 8 out of 10.  He is taking hydrocodone 1 to 2 tablets every 4-6 hours as needed.  He still has severe pain.    Biopsies of (if applicable) revealed: [id number] FINAL MICROSCOPIC DIAGNOSIS:   Clinical History: left lung adenocarcinoma and right scapula bone lesion   A. BONE, RIGHT SCAPULA, BIOPSY:  - Metastatic adenocarcinoma   GROSS DESCRIPTION:   Received in formalin are 3 cores of white-tan soft tissue ranging from  0.7 to 1.0 cm in length, each measuring 0.1 cm in diameter.  The  specimen is entirely submitted in 1 block.  (KW, 07/03/2022)    Cancer Staging  Adenocarcinoma of lung ([facility]) Staging form: Lung, AJCC 7th Edition - Clinical: Stage IA (T1a, N0, M0) - Signed by Baird Cancer, PA on 05/06/2011   Adenocarcinoma of upper lobe of left lung [facility]) Staging form: Lung, AJCC 8th Edition - Clinical stage from 07/16/2022: Stage IVA (cT1b, cN0, pM1b) - Unsigned    Tobacco/Marijuana/Snuff/ETOH use: chewing tobacco  Past/Anticipated interventions by cardiothoracic surgery, if any:  07-03-22 Indications: Lung cancer and bone lesion   Procedure: CT guided biopsy of right scapular bone lesion   Findings: Lytic lesion in right scapula, bone needle confirmed in lesion.  Specimens placed in formalin.   Dr. Valeta Harms on 06-03-22 Operation: Flexible video fiberoptic bronchoscopy with robotic assistance and biopsies.   Past/Anticipated interventions by medical oncology, if any:  Dr. Delton Coombes on 07-16-22  PRIOR THERAPY: - Right upper lobectomy (February 2012) for right lung cancer - SBRT (November 2015) for stage I left adenocarcinoma   NGS Results: Not done   CURRENT THERAPY: Surveillance   BRIEF ONCOLOGIC HISTORY:     Oncology History   Adenocarcinoma of lung ([facility])  09/30/2010 Cancer Staging    CT of chest showed spiculated lesion    10/15/2010 Surgery    Right upper lobectomy by Dr. Arlyce Dice    10/16/2010 Remission       10/24/2013 Imaging    CT Chest-  Enlarging subpleural nodule in the left lower lobe, now 9 mm, with distortion of the overlying pleura. Finding is concerning for a small bronchogenic carcinoma    01/23/2014 Imaging    CT Chest- Left lower lobe nodule has enlarged from 11/01/2012 and is worrisome for primary bronchogenic carcinoma.     07/04/2014 - 07/10/2014 Radiation Therapy    SBRT by Dr. Isidore Moos in 3 fractions.    07/05/2015 Imaging    perifissural nodule in LLL appears slightly smaller with parenchymal opacity attrib to XRT, likely inflamm pathcy airspace dz more inferiorly in LLL, airspace dz in RLL resolved. no metastatic disease    Primary cancer of left lower lobe of lung ([facility])  06/18/2014 Initial Diagnosis    Left lower lobe nodule suspicious for primary lung carcinoma    07/04/2014 - 07/10/2014 Radiation Therapy    SBRT    10/12/2014 Imaging    No acute findings within the chest. Decrease in size of left lower lobe perifissural nodule compatible with response to therapy. No new or progressive disease identified within the chest.    Adenocarcinoma of upper lobe of left lung ([facility])  07/16/2022 Initial Diagnosis    Adenocarcinoma of upper lobe of left lung ([facility])    07/21/2022 -  Chemotherapy  Patient is on Treatment Plan : LUNG NSCLC Pembrolizumab (200) q21d    PLAN:    1.  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 - I have reviewed NGS results.  PD-L1 TPS is very high indicating good response to immunotherapy. - We talked about immunotherapy with pembrolizumab and side effects in detail. - We will request port placement for initiation of  immunotherapy.   2.  Right scapular pain: - He is currently taking hydrocodone 5/325 1 to 2 tablets every 4-6 hours as needed. - She still has pain. - We will make referral to Dr. Isidore Moos for palliative radiation for pain control.   3.  B12 deficiency - Continue B12 tablet every other day and iron tablet daily.   4.  Cystic lesion of left kidney - CTA PE on 04/14/2022 showed complex cystic lesion of the left kidney.  PET scan also showed exophytic 18 mm left renal lesion suspicious for malignancy. - MRI abdomen on 05/16/2022 showed benign appearing Bosniak category 1 and 2 left renal cyst with no evidence of neoplasm.  No further work-up needed.    All questions were answered. The patient knows to call the clinic with any problems, questions or concerns.   Derek Jack, MD   Signs/Symptoms Weight changes, if any:  Wt Readings from Last 3 Encounters:  07/25/22 140 lb 4 oz (63.6 kg)  07/21/22 143 lb (64.9 kg)  07/16/22 142 lb 3.2 oz (64.5 kg)    Respiratory complaints, if any:  just a congested cough from sinuses Hemoptysis, if any: no, but nose has been bleeding alot Pain issues, if any:  right shoulder and back  SAFETY ISSUES: Prior radiation? Yes, to lung Pacemaker/ICD? no Possible current pregnancy?no Is the patient on methotrexate? no  Current Complaints / other details:  nausea with feeling hungry, better after eating. Wants to know the order and what exactly will be treated.   Vitals:   07/25/22 1240  BP: (!) 140/68  Pulse: 82  Resp: 18  Temp: 98.3 F (36.8 C)  SpO2: 97%

## 2022-07-23 ENCOUNTER — Telehealth: Payer: Self-pay | Admitting: *Deleted

## 2022-07-23 ENCOUNTER — Ambulatory Visit (HOSPITAL_COMMUNITY): Admission: RE | Admit: 2022-07-23 | Payer: Medicare Other | Source: Ambulatory Visit

## 2022-07-23 NOTE — Telephone Encounter (Addendum)
Received tc from wife stating that he spiked a temperature >101, associated with chills and vomiting and was advised to go to the ER.  He is currently receiving Radiation treatment and was to have a port placed today, which she had rescheduled for 11/27.  She states that his temperature is down this morning to 99.3 and is not agreeable to go to the ER at this time.  Made her aware that it would be in his best interest to go to the ER, especially if temperature continues to be abnormal.  Advised her to at least see PCP if he does not agree to be seen in the ER, as treatment can cause infectious processes to occur which could be serious given his diagnosis and treatment.  Verbalized understanding.

## 2022-07-24 ENCOUNTER — Ambulatory Visit (HOSPITAL_COMMUNITY)
Admission: RE | Admit: 2022-07-24 | Discharge: 2022-07-24 | Disposition: A | Discharge status: Home / Self Care | Payer: Medicare Other | Source: Ambulatory Visit | Attending: Hematology | Admitting: Hematology

## 2022-07-24 DIAGNOSIS — G939 Disorder of brain, unspecified: Secondary | ICD-10-CM | POA: Diagnosis not present

## 2022-07-24 DIAGNOSIS — C3490 Malignant neoplasm of unspecified part of unspecified bronchus or lung: Secondary | ICD-10-CM | POA: Diagnosis not present

## 2022-07-24 DIAGNOSIS — R220 Localized swelling, mass and lump, head: Secondary | ICD-10-CM | POA: Diagnosis not present

## 2022-07-24 MED ORDER — GADOBUTROL 1 MMOL/ML IV SOLN
6.5000 mL | Freq: Once | INTRAVENOUS | Status: AC | PRN
  Administered 2022-07-24: 6.5 mL via INTRAVENOUS

## 2022-07-24 NOTE — Progress Notes (Signed)
Radiation Oncology         [phone] ________________________________  Initial Outpatient Consultation  Name: Maurice Orozco MRN: [id number]  Date: 07/25/2022  DOB: 1946-02-14  CC:Redmond [employer], MD  Derek Jack, MD   REFERRING PHYSICIAN: Derek Jack, MD  DIAGNOSIS:    ICD-10-CM   1. Adenocarcinoma of left lung (HCC)  C34.92     2. Metastasis to bone (HCC)  C79.51 morphine (PF) 4 MG/ML injection 1 mg    3. 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)  C34.12      Recently diagnosed adenocarcinoma of the left upper lobe (positive for TTF-1 and negative for p40) with osseous metastasis - painful right scapular lesion   History of bilateral lung cancers: left lung cancer in 2015 s/p radiation, and right lung cancer in 2012 s/p right upper lobectomy    Cancer Staging  Adenocarcinoma of lung ([place]) Staging form: Lung, AJCC 7th Edition - Clinical: Stage IA (T1a, N0, M0) - Signed by Baird Cancer, PA on 05/06/2011  Adenocarcinoma of upper lobe of left lung ([place]) Staging form: Lung, AJCC 8th Edition - Clinical stage from 07/16/2022: Stage IVA (cT1b, cN0, pM1b) - Unsigned Histopathologic type: Adenocarcinoma, NOS Stage prefix: Initial diagnosis   CHIEF COMPLAINT: Here to discuss management of painful right scapular lesion from left lung cancer primary  HISTORY OF PRESENT ILLNESS::Maurice Orozco is a 76 y.o. male with a history significant for bilateral lung cancers, s/p radiation to the left lower lung in 2015 under the care of myself, and right upper lobectomy in 2012. I last met with the patient for follow-up in February 2020. He was NED at that time.   The patient recently presented for a lung cancer screening CT on 04/14/22 which showed an increase in size of nodular soft tissue in the posterior left upper lobe, measuring 11.4 mm, previously 7.0 mm on imaging from 06/06/2020. He also had a CTA of the abdomen (indicated due to history of AKI) performed  that day which also demonstrated a new nodularity in the inferior lingula, measuring 9 mm. (CTA also showed a complex cystic lesion of the left kidney measuring approximately 1.9 cm with suggestion of 7 mm peripheral mural nodularity).   Accordingly, the patient followed up with Dr. Delton Coombes on 04/21/22 who recommended proceeding with a PET scan for further evaluation.   PET scan on 04/24/22 showed the LUL nodule as hypermetabolic, and thus concerning for primary neoplasm. PET scan also showed a hypermetabolic focus in the inferior aspect of the right scapula (without discrete CT correlate) as suspicious for early osseous metastatic disease, and a exophytic 18 mm left renal lesion with an anterior focus of mural nodularity measuring 8 mm, suspicious for a cystic renal neoplasm . Otherwise, PET showed no hypermetabolic thoracic adenopathy or evidence of intra-abdominopelvic hypermetabolic metastatic disease.   (Abdominal MRI on 05/16/22 for further evaluation of the left kidney lesion showed  small benign-appearing Bosniak category 1 and 2 left renal cysts, without evidence of renal neoplasm or hydronephrosis).   Chest CT without contrast on 05/30/22 redemonstrated the LUL nodule measuring 13 mm in diameter, suspicious for primary bronchogenic neoplasm. CT also showed a  tiny adjacent 6 x 4 mm left upper lobe nodule as slightly more apparent than on prior studies; other stable post treatment related changes in the lungs; and findings suggestive of COPD.   Subsequently, the patient was referred to Dr. Valeta Harms and opted to proceed with bronchoscopy with biopsies on 06/03/22. FNA of  the left upper lobe revealed findings consistent with adenocarcinoma (positive for TTF-1 and negative for p40). Biopsy of the right mainstem bronchus also performed showed benign findings.   During a follow up visit with Dr. Delton Coombes on 06/24/22, the patient reported new onset of right scapular pain x 2 weeks, worse with  movement, and low energy. Given that his recent PET scan showed uptake in that same area, Dr. Delton Coombes recommended proceeding with IR guided biopsy of the scapular lesion. For his scapular pain, he was given hydrocodone, however his pain remained quite severe.   Biopsy of the right scapular lesion on 07/03/22 unfortunately showed metastatic adenocarcinoma.   NGS testing ordered showed high PD-L1 and TPS, indicating a good response to immunotherapy.   Following discussion with Dr. Delton Coombes on 07/16/22, the patient agreed to proceed with immunotherapy consisting of pembrolizumab Beryle Flock). He also agreed to meet with me today for consideration of palliative radiation therapy to painful right scapular lesion.   He had his first [place] infusion on 07/21/22.  He has quite a bit of pain despite taking short acting pain medication around-the-clock   PREVIOUS RADIATION THERAPY: Yes,   Diagnosis:   left lower lobe nodule suspicious for primary lung carcinoma, T1aN0M0 Stage IA Indication for treatment:  curative   Radiation treatment dates:   07/04/2014, 07/06/2014, 07/10/2014 Site/dose:   Left lower lung nodule / 54 Gy in 3 fractions given every other day Beams/energy:   SBRT 3D  //// 6 MV FFF  PAST MEDICAL HISTORY:  has a past medical history of Adenocarcinoma of lung ([place]) (05/06/2011), Allergy, Anemia, Anxiety, Arthritis, Back fracture, Chronic back pain, COPD (chronic obstructive pulmonary disease) with emphysema ([place]) (05/06/2011), Essential hypertension (05/06/2011), Gallstones, GERD (gastroesophageal reflux disease), High cholesterol, High coronary artery calcium score Vilinda Flake 2018), History of kidney stones, History of radiation therapy (07/04/14, 07/06/14, 07/10/14), Hypercholesterolemia (05/06/2011), Pneumonia due to Streptococcus, Port-A-Cath in place (07/21/2022), Pre-diabetes, and Stress fx pelvis.    PAST SURGICAL HISTORY: Past Surgical History:  Procedure Laterality Date   BACK  SURGERY  07/04/2017   fusion above previous surgery   BACK SURGERY  2011   metal plate l-4,L5   BRONCHIAL BIOPSY  06/03/2022   Procedure: BRONCHIAL BIOPSIES;  Surgeon: Garner Nash, DO;  Location: [facility];  Service: Pulmonary;;   BRONCHIAL BRUSHINGS  06/03/2022   Procedure: BRONCHIAL BRUSHINGS;  Surgeon: Garner Nash, DO;  Location: [facility];  Service: Pulmonary;;   BRONCHIAL NEEDLE ASPIRATION BIOPSY  06/03/2022   Procedure: BRONCHIAL NEEDLE ASPIRATION BIOPSIES;  Surgeon: Garner Nash, DO;  Location: [facility];  Service: Pulmonary;;   Coroanry Calcium Score  03/2017   1043. Noted especially in RCA. Dense calcification, recommend Myoview over Cor CTA.   FIDUCIAL MARKER PLACEMENT  06/03/2022   Procedure: FIDUCIAL MARKER PLACEMENT;  Surgeon: Garner Nash, DO;  Location: [facility];  Service: Pulmonary;;   LIPOMA EXCISION N/A 10/14/2017   Procedure: EXCISION LIPOMA ON BACK;  Surgeon: Virl Cagey, MD;  Location: [facility];  Service: General;  Laterality: N/A;   LIPOMA EXCISION Left 11/19/2018   Procedure: MINOR EXCISION OF SEBACEOUS CYST NECK;  Surgeon: Virl Cagey, MD;  Location: [facility];  Service: General;  Laterality: Left;  pt knows to arrive at 7:00   LUNG LOBECTOMY  2012   RT [place]  04/2017   EF 60%. Hypertensive response to exercise. (6:21 min, 7.7 METs) No EKG changes. LOW RISK    FAMILY HISTORY: family  history includes Cancer in his maternal uncle, maternal uncle, maternal uncle, and maternal uncle; Heart attack in his brother; Hypertension in his mother; Kidney disease in his father.  SOCIAL HISTORY:  reports that he quit smoking about 11 years ago. His smoking use included cigarettes. He has a 88.50 pack-year smoking history. His smokeless tobacco use includes chew. He reports that he does not drink alcohol and does not use drugs.  ALLERGIES: Fentanyl, Gabapentin, Nabumetone, and Aspirin  MEDICATIONS:  Current Outpatient  Medications  Medication Sig Dispense Refill   albuterol (PROVENTIL HFA;VENTOLIN HFA) 108 (90 Base) MCG/ACT inhaler Inhale 2 puffs into the lungs every 4 (four) hours as needed for wheezing or shortness of breath. 2 Inhaler 3   albuterol (PROVENTIL) (2.5 MG/3ML) 0.083% nebulizer solution Take 2.5 mg by nebulization every 6 (six) hours as needed for wheezing or shortness of breath.     cholecalciferol (VITAMIN D3) 25 MCG (1000 UNIT) tablet Take 1,000 Units by mouth daily.     cimetidine (TAGAMET) 200 MG tablet Take 400-600 mg by mouth daily as needed (for heartburn).     ferrous sulfate 325 (65 FE) MG tablet Take 325 mg by mouth daily with breakfast.     fluticasone (FLONASE) 50 MCG/ACT nasal spray Place 2 sprays into both nostrils daily as needed for rhinitis.     guaiFENesin (MUCINEX) 600 MG 12 hr tablet Take 600 mg by mouth daily.     hydrochlorothiazide (HYDRODIURIL) 25 MG tablet Take 12.5 mg by mouth daily.     HYDROcodone-acetaminophen (NORCO) 10-325 MG tablet Take 1-2 tablets by mouth every 4 (four) hours as needed for moderate pain. 120 tablet 0   lidocaine-prilocaine (EMLA) cream Apply a small amount to port a cath site and cover with plastic wrap 1 hour prior to infusion appointments 30 g 3   Menthol, Topical Analgesic, (ICY HOT EX) Apply 1 Application topically daily as needed (pain).     Oxycodone HCl 10 MG TABS Take 10 mg by mouth 2 (two) times daily as needed (pain).     pantoprazole (PROTONIX) 40 MG tablet Take 40 mg by mouth See admin instructions. Take 40 mg daily, may take a second 40 mg dose as needed for heartburn     PEMBROLIZUMAB IV Inject into the vein every 21 ( twenty-one) days.     polyethylene glycol powder (GLYCOLAX/MIRALAX) 17 GM/SCOOP powder Take 17 g by mouth daily as needed for mild constipation or moderate constipation.     prochlorperazine (COMPAZINE) 10 MG tablet Take 1 tablet (10 mg total) by mouth every 6 (six) hours as needed for nausea or vomiting. 30 tablet 1    rosuvastatin (CRESTOR) 40 MG tablet Take 40 mg by mouth at bedtime.     triamcinolone cream (KENALOG) 0.1 % Apply 1 Application topically daily as needed (tick bite/itching).     triazolam (HALCION) 0.25 MG tablet Take 0.25 mg by mouth at bedtime.     vitamin B-12 (CYANOCOBALAMIN) 1000 MCG tablet Take 1,000 mcg by mouth every other day.     naproxen sodium (ALEVE) 220 MG tablet Take 220 mg by mouth daily as needed (pain). (Patient not taking: Reported on 07/25/2022)     Omega-3 Fatty Acids (FISH OIL) 1000 MG CAPS Take 1,000 mg by mouth daily.     No current facility-administered medications for this encounter.    REVIEW OF SYSTEMS:  Notable for that above.   PHYSICAL EXAM:  height is _0  (1.676 m) and weight is 140 lb 4  oz (63.6 kg). His oral temperature is 98.3 F (36.8 C). His blood pressure is 140/68 (abnormal) and his pulse is 82. His respiration is 18 and oxygen saturation is 97%.   General: Alert and oriented, in no acute distress  HEENT: Extraocular movements are intact.  Head is normocephalic. Heart: Regular in rate and rhythm with no murmurs, rubs, or gallops. Chest: Clear to auscultation bilaterally, with no rhonchi, wheezes, or rales. Skin: No concerning lesions. Musculoskeletal: symmetric strength and muscle tone throughout.  There is swelling of the soft tissue over the right scapula.  Surgical scars above and below the area of swelling which I presume are from previous thoracotomy Neurologic: Cranial nerves II through XII are grossly intact. No obvious focalities. Speech is fluent. Coordination is intact. Psychiatric: Judgment and insight are intact. Affect is appropriate.   ECOG = 1  0 - Asymptomatic (Fully active, able to carry on all predisease activities without restriction)  1 - Symptomatic but completely ambulatory (Restricted in physically strenuous activity but ambulatory and able to carry out work of a light or sedentary nature. For example, light housework,  office work)  2 - Symptomatic, <50% in bed during the day (Ambulatory and capable of all self care but unable to carry out any work activities. Up and about more than 50% of waking hours)  3 - Symptomatic, >50% in bed, but not bedbound (Capable of only limited self-care, confined to bed or chair 50% or more of waking hours)  4 - Bedbound (Completely disabled. Cannot carry on any self-care. Totally confined to bed or chair)  5 - Death   Eustace Pen MM, Creech RH, Tormey DC, et al. [phone]). "Toxicity and response criteria of the [facility]". [place] Oncol. 5 (6): 649-55   LABORATORY DATA:  Lab Results  Component Value Date   WBC 7.9 07/21/2022   HGB 13.0 07/21/2022   HCT 38.7 (L) 07/21/2022   MCV 100.5 (H) 07/21/2022   PLT 231 07/21/2022   CMP     Component Value Date/Time   NA 137 07/21/2022 1010   K 3.6 07/21/2022 1010   CL 105 07/21/2022 1010   CO2 26 07/21/2022 1010   GLUCOSE 118 (H) 07/21/2022 1010   BUN 13 07/21/2022 1010   BUN 23.9 07/05/2015 1239   CREATININE 0.77 07/21/2022 1010   CREATININE 0.9 07/05/2015 1239   CALCIUM 8.7 (L) 07/21/2022 1010   PROT 6.8 07/21/2022 1010   ALBUMIN 3.8 07/21/2022 1010   AST 17 07/21/2022 1010   ALT 17 07/21/2022 1010   ALKPHOS 57 07/21/2022 1010   BILITOT 0.6 07/21/2022 1010   GFRNONAA >60 07/21/2022 1010   GFRAA >60 05/29/2020 1407         RADIOGRAPHY: CT BONE TROCAR/NEEDLE BIOPSY DEEP  Result Date: 07/03/2022 INDICATION: 76 year old with left lung cancer and suspicious lytic lesion in the right scapula. EXAM: CT-guided biopsy of right scapular bone lesion MEDICATIONS: Moderate sedation ANESTHESIA/SEDATION: Moderate (conscious) sedation was employed during this procedure. A total of Versed 1.0 mg and Dilaudid 1 mg was administered intravenously by the radiology nurse. Total intra-service moderate Sedation Time: 10 minutes. The patient's level of consciousness and vital signs were monitored continuously by  radiology nursing throughout the procedure under my direct supervision. FLUOROSCOPY TIME:  None COMPLICATIONS: None immediate. PROCEDURE: Informed written consent was obtained from the patient after a thorough discussion of the procedural risks, benefits and alternatives. All questions were addressed. A timeout was performed prior to the initiation of the  procedure. Patient was placed prone on the CT scanner. Images through the chest were obtained. The lytic lesion involving the inferior right scapula was identified. The overlying skin was prepped with chlorhexidine and sterile field was created. Using CT guidance, 17 gauge coaxial needle was directed into the lytic bone lesion. Multiple core biopsies were obtained with an 18 gauge core device. Specimens placed in formalin. 17 gauge coaxial needle was removed. Bandage placed over the puncture site. FINDINGS: Destructive lytic lesion involving the inferior right scapula measuring up to 2.2 cm. Biopsy needle was confirmed within the lesion. Adequate specimens were obtained and placed in formalin. IMPRESSION: CT-guided core biopsy of the right scapular bone lesion. Electronically Signed   By: Markus Daft M.D.   On: 07/03/2022 09:27      IMPRESSION/PLAN: This is a delightful 76 year old gentleman whom I know quite well with a history of multifocal lung cancer.  He now has a new adenocarcinoma of the left upper lung with a focus of metastatic adenocarcinoma to the right scapula.  He has started pembrolizumab.  Today we talked about SBRT to both of these lesions.  Treatment would be given every other day for 5 fractions.  We talked about the possibility of a synergistic benefit to giving SBRT and pembrolizumab concurrently. The goal of treatment would actually be curative because he has a limited burden of disease.  No guarantees of treatment were given.  The risks of treatment to the lung would include rib fracture, fatigue, injury to lung, pneumonitis, or other  intrathoracic tissues.  Risks of treatment to the scapula would include rib fracture, fracture of the scapula, soft tissue or bone necrosis/injury, skin irritation, fatigue.  All of his questions were answered to his satisfaction.  I shared his imaging personally with the patient and his wife.  They would like to proceed with treatment planning.  We will do this today and start his treatment after Thanksgiving.  They are pleased with this plan.  I personally reached out to Dr. Valeta Harms and Dr. Delton Coombes about my plan.   On date of service, in total, I spent 50 minutes on this encounter. Patient was seen in person.   __________________________________________   Eppie Gibson, MD  This document serves as a record of services personally performed by Eppie Gibson, MD. It was created on her behalf by Roney Mans, a trained medical scribe. The creation of this record is based on the scribe's personal observations and the provider's statements to them. This document has been checked and approved by the attending provider.

## 2022-07-25 ENCOUNTER — Ambulatory Visit
Admission: RE | Admit: 2022-07-25 | Discharge: 2022-07-25 | Disposition: A | Discharge status: Home / Self Care | Payer: Medicare Other | Source: Ambulatory Visit | Attending: Radiation Oncology | Admitting: Radiation Oncology

## 2022-07-25 ENCOUNTER — Other Ambulatory Visit: Payer: Self-pay

## 2022-07-25 ENCOUNTER — Encounter: Payer: Self-pay | Admitting: Radiation Oncology

## 2022-07-25 VITALS — BP 140/68 | HR 82 | Temp 98.3°F | Resp 18 | Ht 66.0 in | Wt 140.2 lb

## 2022-07-25 DIAGNOSIS — I10 Essential (primary) hypertension: Secondary | ICD-10-CM | POA: Diagnosis not present

## 2022-07-25 DIAGNOSIS — C7951 Secondary malignant neoplasm of bone: Secondary | ICD-10-CM | POA: Insufficient documentation

## 2022-07-25 DIAGNOSIS — Z510 Encounter for antineoplastic radiation therapy: Secondary | ICD-10-CM | POA: Diagnosis not present

## 2022-07-25 DIAGNOSIS — C3412 Malignant neoplasm of upper lobe, left bronchus or lung: Secondary | ICD-10-CM | POA: Diagnosis not present

## 2022-07-25 DIAGNOSIS — Z923 Personal history of irradiation: Secondary | ICD-10-CM | POA: Insufficient documentation

## 2022-07-25 DIAGNOSIS — Z87442 Personal history of urinary calculi: Secondary | ICD-10-CM | POA: Insufficient documentation

## 2022-07-25 DIAGNOSIS — K219 Gastro-esophageal reflux disease without esophagitis: Secondary | ICD-10-CM | POA: Insufficient documentation

## 2022-07-25 DIAGNOSIS — Z79899 Other long term (current) drug therapy: Secondary | ICD-10-CM | POA: Diagnosis not present

## 2022-07-25 DIAGNOSIS — M549 Dorsalgia, unspecified: Secondary | ICD-10-CM | POA: Insufficient documentation

## 2022-07-25 DIAGNOSIS — J449 Chronic obstructive pulmonary disease, unspecified: Secondary | ICD-10-CM | POA: Diagnosis not present

## 2022-07-25 DIAGNOSIS — E7800 Pure hypercholesterolemia, unspecified: Secondary | ICD-10-CM | POA: Diagnosis not present

## 2022-07-25 DIAGNOSIS — C3432 Malignant neoplasm of lower lobe, left bronchus or lung: Secondary | ICD-10-CM

## 2022-07-25 DIAGNOSIS — Z87891 Personal history of nicotine dependence: Secondary | ICD-10-CM | POA: Insufficient documentation

## 2022-07-25 DIAGNOSIS — C3492 Malignant neoplasm of unspecified part of left bronchus or lung: Secondary | ICD-10-CM

## 2022-07-25 MED ORDER — MORPHINE SULFATE (PF) 4 MG/ML IV SOLN
1.0000 mg | INTRAVENOUS | Status: AC
  Administered 2022-07-25: 1 mg via INTRAMUSCULAR
  Filled 2022-07-25: qty 0.3

## 2022-07-26 ENCOUNTER — Other Ambulatory Visit: Payer: Self-pay

## 2022-07-28 ENCOUNTER — Other Ambulatory Visit: Payer: Self-pay | Admitting: *Deleted

## 2022-07-28 ENCOUNTER — Encounter: Payer: Self-pay | Admitting: Hematology

## 2022-07-28 MED ORDER — HYDROCODONE-ACETAMINOPHEN 10-325 MG PO TABS: 1.0000 | ORAL_TABLET | ORAL | 0 refills | Status: DC | PRN

## 2022-07-29 DIAGNOSIS — C7951 Secondary malignant neoplasm of bone: Secondary | ICD-10-CM | POA: Diagnosis not present

## 2022-07-29 DIAGNOSIS — C3412 Malignant neoplasm of upper lobe, left bronchus or lung: Secondary | ICD-10-CM | POA: Diagnosis not present

## 2022-07-29 DIAGNOSIS — Z510 Encounter for antineoplastic radiation therapy: Secondary | ICD-10-CM | POA: Diagnosis not present

## 2022-07-30 ENCOUNTER — Other Ambulatory Visit: Payer: Self-pay | Admitting: Radiology

## 2022-08-02 ENCOUNTER — Other Ambulatory Visit: Payer: Self-pay

## 2022-08-04 ENCOUNTER — Ambulatory Visit (HOSPITAL_COMMUNITY)
Admission: RE | Admit: 2022-08-04 | Discharge: 2022-08-04 | Disposition: A | Discharge status: Home / Self Care | Payer: Medicare Other | Source: Ambulatory Visit | Attending: Hematology | Admitting: Hematology

## 2022-08-04 ENCOUNTER — Encounter (HOSPITAL_COMMUNITY): Payer: Self-pay

## 2022-08-04 ENCOUNTER — Ambulatory Visit: Payer: Medicare Other | Admitting: Radiation Oncology

## 2022-08-04 ENCOUNTER — Other Ambulatory Visit: Payer: Self-pay

## 2022-08-04 DIAGNOSIS — C3490 Malignant neoplasm of unspecified part of unspecified bronchus or lung: Secondary | ICD-10-CM | POA: Diagnosis not present

## 2022-08-04 DIAGNOSIS — Z452 Encounter for adjustment and management of vascular access device: Secondary | ICD-10-CM | POA: Diagnosis not present

## 2022-08-04 DIAGNOSIS — C3492 Malignant neoplasm of unspecified part of left bronchus or lung: Secondary | ICD-10-CM | POA: Diagnosis not present

## 2022-08-04 HISTORY — PX: IR IMAGING GUIDED PORT INSERTION: IMG5740

## 2022-08-04 MED ORDER — LIDOCAINE-EPINEPHRINE 1 %-1:100000 IJ SOLN
INTRAMUSCULAR | Status: AC
  Filled 2022-08-04: qty 1

## 2022-08-04 MED ORDER — HEPARIN SOD (PORK) LOCK FLUSH 100 UNIT/ML IV SOLN
INTRAVENOUS | Status: AC
  Administered 2022-08-04: 500 [IU]
  Filled 2022-08-04: qty 5

## 2022-08-04 MED ORDER — HYDROMORPHONE HCL 1 MG/ML IJ SOLN
INTRAMUSCULAR | Status: AC | PRN
  Administered 2022-08-04 (×2): .5 mg via INTRAVENOUS

## 2022-08-04 MED ORDER — HYDROMORPHONE HCL 1 MG/ML IJ SOLN
INTRAMUSCULAR | Status: AC
  Filled 2022-08-04: qty 1

## 2022-08-04 MED ORDER — SODIUM CHLORIDE 0.9 % IV SOLN: INTRAVENOUS | Status: DC

## 2022-08-04 MED ORDER — MIDAZOLAM HCL 2 MG/2ML IJ SOLN
INTRAMUSCULAR | Status: AC | PRN
  Administered 2022-08-04: .5 mg via INTRAVENOUS
  Administered 2022-08-04: 1 mg via INTRAVENOUS
  Administered 2022-08-04: .5 mg via INTRAVENOUS

## 2022-08-04 MED ORDER — MIDAZOLAM HCL 2 MG/2ML IJ SOLN
INTRAMUSCULAR | Status: AC
  Filled 2022-08-04: qty 2

## 2022-08-04 MED ORDER — HYDROMORPHONE HCL 1 MG/ML IJ SOLN
0.5000 mg | Freq: Once | INTRAMUSCULAR | Status: AC
  Administered 2022-08-04: 0.5 mg via INTRAVENOUS

## 2022-08-04 NOTE — Procedures (Signed)
Vascular and Interventional Radiology Procedure Note  Patient: Maurice Orozco DOB: [birth year] Medical Record Number: [id number] Note Date/Time: 08/04/22 12:58 PM   Performing Physician: Michaelle Birks, MD Assistant(s): None  Diagnosis: Lung cancer  Procedure: PORT PLACEMENT  Anesthesia: Conscious Sedation Complications: None Estimated Blood Loss: Minimal  Findings:  Successful right-sided port placement, with the tip of the catheter in the proximal right atrium.  Plan: Catheter ready for use.  See detailed procedure note with images in PACS. The patient tolerated the procedure well without incident or complication and was returned to Recovery in stable condition.    Michaelle Birks, MD [facility] Radiology   Pager. [place]

## 2022-08-04 NOTE — Sedation Documentation (Signed)
Patient transported to short stay within 15 min timeframe. See vitals on flowsheet. Patient is alert and oriented. V/s WNL. Patient states his pain " is fair" at this time. Dressing sites intact. No drainage

## 2022-08-04 NOTE — H&P (Signed)
Chief Complaint: Patient was seen in consultation today for  Port-A-Cath placement  Referring Physician(s): Katragadda,Sreedhar  Supervising Physician: Michaelle Birks  Patient Status: [facility]  History of Present Illness: Maurice Orozco is a 76 y.o. male with medical history significant for bilateral lung cancer s/p RU lobectomy (2012). Pt is followed by Dr. Delton Coombes, oncology. On a recent follow-up visit for lung cancer, pt c/o right scapular pain that is exacerbated with movement. PET scan showed hypermetabloic focus in the same area Lytic lesion was biopsied 07/03/22 by Dr. Anselm Pancoast and returned positive for metastatic adenocarcinoma. He now presents to [facility] for Port-A-Cath placement.   Patient presents in his usual state of health today.  He has been NPO.  He does have any new complaints or concerns.  He is understanding of the goals of the procedure today.  He does have baseline right shoulder pain r/t lytic lesion which is unchanged. His wife, Tandy Gaw, is available for post-procedure care and transportation.   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 dense RCA calcification --Non-ischemic Myoview   History of kidney stones    in the 70's   History of radiation therapy 07/04/14, 07/06/14, 07/10/14   LLL lung nodule/54 Gy/3 fx- SBRT   Hypercholesterolemia 05/06/2011   Pneumonia due to Streptococcus    HX. POST OPERATIVELY   Port-A-Cath in place 07/21/2022   Pre-diabetes    Stress fx pelvis    DUE TO AA  IN 1970    Past Surgical History:  Procedure Laterality Date   BACK SURGERY  07/04/2017   fusion above previous surgery   BACK SURGERY  2011   metal plate l-4,L5   BRONCHIAL BIOPSY  06/03/2022   Procedure: BRONCHIAL BIOPSIES;  Surgeon: Garner Nash, DO;  Location: [place] [facility];  Service: Pulmonary;;   BRONCHIAL BRUSHINGS  06/03/2022   Procedure: BRONCHIAL BRUSHINGS;  Surgeon: Garner Nash, DO;  Location: [place];  Service: Pulmonary;;   BRONCHIAL NEEDLE ASPIRATION BIOPSY  06/03/2022   Procedure: BRONCHIAL NEEDLE ASPIRATION BIOPSIES;  Surgeon: Garner Nash, DO;  Location: [place];  Service: Pulmonary;;   Coroanry Calcium Score  03/2017   1043. Noted especially in RCA. Dense calcification, recommend Myoview over Cor CTA.   FIDUCIAL MARKER PLACEMENT  06/03/2022   Procedure: FIDUCIAL MARKER PLACEMENT;  Surgeon: Garner Nash, DO;  Location: [place] [facility];  Service: Pulmonary;;   LIPOMA EXCISION N/A 10/14/2017   Procedure: EXCISION LIPOMA ON BACK;  Surgeon: Virl Cagey, MD;  Location: [facility];  Service: General;  Laterality: N/A;   LIPOMA EXCISION Left 11/19/2018   Procedure: MINOR EXCISION OF SEBACEOUS CYST NECK;  Surgeon: Virl Cagey, MD;  Location: [facility];  Service: General;  Laterality: Left;  pt knows to arrive at 7:00   LUNG LOBECTOMY  2012   RT [place]  04/2017   EF 60%. Hypertensive response  to exercise. (6:21 min, 7.7 METs) No EKG changes. LOW RISK    Allergies: Fentanyl, Gabapentin, Nabumetone, and Aspirin  Medications: Prior to Admission medications   Medication Sig Start Date End Date Taking? Authorizing Provider  albuterol (PROVENTIL HFA;VENTOLIN HFA) 108 (90 Base) MCG/ACT inhaler Inhale 2 puffs into the lungs every 4 (four) hours as needed for wheezing or shortness of breath. 04/23/18  Yes Martyn Ehrich, NP  albuterol (PROVENTIL) (2.5 MG/3ML) 0.083% nebulizer solution Take 2.5 mg by nebulization every 6 (six) hours as  needed for wheezing or shortness of breath.   Yes [provider]  cholecalciferol (VITAMIN D3) 25 MCG (1000 UNIT) tablet Take 1,000 Units by mouth daily.   Yes [provider]  cimetidine (TAGAMET) 200 MG tablet Take 400-600 mg by mouth daily as needed (for heartburn).   Yes [provider]  fluticasone (FLONASE) 50 MCG/ACT nasal spray Place 2 sprays into both nostrils daily as needed for rhinitis.   Yes [provider]  guaiFENesin (MUCINEX) 600 MG 12 hr tablet Take 600 mg by mouth daily. 04/28/20  Yes [provider]  hydrochlorothiazide (HYDRODIURIL) 25 MG tablet Take 12.5 mg by mouth daily. 08/14/20  Yes [provider]  HYDROcodone-acetaminophen (NORCO) 10-325 MG tablet Take 1-2 tablets by mouth every 4 (four) hours as needed for moderate pain. 04/28/20  Yes Kathie Dike, MD  Iron Combinations (CHROMAGEN) capsule Take 2 capsules by mouth daily.   Yes [provider]  Menthol, Topical Analgesic, (ICY HOT EX) Apply 1 Application topically daily as needed (pain).   Yes [provider]  naproxen sodium (ALEVE) 220 MG tablet Take 220 mg by mouth daily as needed (pain).   Yes [provider]  neomycin-polymyxin-hydrocortisone (CORTISPORIN) 3.5-10000-1 OTIC suspension Place 3-4 drops into both ears as needed (ear stop up). 07/16/20  Yes [provider]  Omega-3 Fatty Acids (FISH OIL) 1000 MG CAPS Take 1,000 mg by mouth daily.   Yes [provider]  Oxycodone HCl 10 MG TABS Take 10 mg by mouth 2 (two) times daily as needed (pain). 09/16/21  Yes [provider]  pantoprazole (PROTONIX) 40 MG tablet Take 40 mg by mouth See admin instructions. Take 40 mg daily, may take a second 40 mg dose as needed for heartburn 03/01/20  Yes [provider]  polyethylene glycol powder (GLYCOLAX/MIRALAX) 17 GM/SCOOP powder Take 17 g by mouth daily as needed for mild constipation or moderate constipation.   Yes  [provider]  rosuvastatin (CRESTOR) 40 MG tablet Take 1 tablet (40 mg total) by mouth daily. Patient taking differently: Take 40 mg by mouth at bedtime. 11/01/18 07/01/22 Yes Leonie Man, MD  triamcinolone cream (KENALOG) 0.1 % Apply 1 Application topically daily as needed (tic bite/itching). 04/29/22  Yes [provider]  triazolam (HALCION) 0.25 MG tablet Take 0.25 mg by mouth at bedtime. 05/09/22  Yes [provider]  vitamin B-12 (CYANOCOBALAMIN) 1000 MCG tablet Take 1,000 mcg by mouth every other day.   Yes [provider]  Budeson-Glycopyrrol-Formoterol (BREZTRI AEROSPHERE) 160-9-4.8 MCG/ACT AERO Inhale 2 puffs into the lungs in the morning and at bedtime. Patient not taking: Reported on 07/01/2022 06/10/22   Magdalen Spatz, NP     Family History  Problem Relation Age of Onset   Hypertension Mother    Kidney disease Father    Heart attack Brother    Cancer Maternal Uncle        prostate cancer   Cancer Maternal Uncle  prostate cancer   Cancer Maternal Uncle        prostate cancer   Cancer Maternal Uncle        prostate cancer    Social History   Socioeconomic History   Marital status: Married    Spouse name: Not on file   Number of children: 3   Years of education: Not on file   Highest education level: Not on file  Occupational History   Not on file  Tobacco Use   Smoking status: Former    Packs/day: 1.50    Years: 59.00    Total pack years: 88.50    Types: Cigarettes    Quit date: 09/08/2010    Years since quitting: 11.9   Smokeless tobacco: Current    Types: Chew   Tobacco comments:    still chews tobacco, he tells me he hasn't in the past 2 days.   Vaping Use   Vaping Use: Never used  Substance and Sexual Activity   Alcohol use: No   Drug use: No   Sexual activity: Not Currently  Other Topics Concern   Not on file  Social History Narrative   Not on file   Social Determinants of Health   Financial  Resource Strain: Not on file  Food Insecurity: Not on file  Transportation Needs: No Transportation Needs (07/13/2018)   PRAPARE - Hydrologist (Medical): No    Lack of Transportation (Non-Medical): No  Physical Activity: Not on file  Stress: Not on file  Social Connections: Not on file    Review of Systems: A 12 point ROS discussed and pertinent positives are indicated in the HPI above.  All other systems are negative.  Review of Systems  Constitutional:  Negative for appetite change, chills and fever.  Respiratory:  Negative for shortness of breath.   Cardiovascular:  Negative for chest pain and leg swelling.  Gastrointestinal:  Negative for abdominal pain, nausea and vomiting.  Musculoskeletal:  Positive for back pain.  Neurological:  Negative for dizziness and headaches.    Vital Signs: BP 139/76   Pulse 77   Temp (!) 97.5 F (36.4 C)   Resp 19   Ht 5\' 6"  (1.676 m)   Wt 140 lb (63.5 kg)   SpO2 97%   BMI 22.60 kg/m     Physical Exam Vitals reviewed.  Constitutional:      General: He is not in acute distress.    Appearance: Normal appearance. He is not ill-appearing.  HENT:     Head: Normocephalic and atraumatic.     Mouth/Throat:     Mouth: Mucous membranes are dry.     Pharynx: Oropharynx is clear.  Eyes:     Extraocular Movements: Extraocular movements intact.     Pupils: Pupils are equal, round, and reactive to light.  Cardiovascular:     Rate and Rhythm: Normal rate and regular rhythm.     Pulses: Normal pulses.     Heart sounds: Normal heart sounds. No murmur heard. Pulmonary:     Effort: Pulmonary effort is normal. No respiratory distress.     Comments: Decreased breath sounds Abdominal:     General: Bowel sounds are normal. There is no distension.     Palpations: Abdomen is soft.     Tenderness: There is no abdominal tenderness. There is no guarding.  Musculoskeletal:     Right lower leg: No edema.     Left lower leg:  No edema.  Skin:    General: Skin is warm and dry.  Neurological:     Mental Status: He is alert and oriented to person, place, and time.  Psychiatric:        Mood and Affect: Mood normal.        Behavior: Behavior normal.        Thought Content: Thought content normal.        Judgment: Judgment normal.     Imaging: MR Brain W Wo Contrast  Result Date: 07/26/2022 CLINICAL DATA:  Metastatic disease evaluation metastatic adenocarcinoma of the lung EXAM: MRI HEAD WITHOUT AND WITH CONTRAST TECHNIQUE: Multiplanar, multiecho pulse sequences of the brain and surrounding structures were obtained without and with intravenous contrast. CONTRAST:  6.68mL GADAVIST GADOBUTROL 1 MMOL/ML IV SOLN COMPARISON:  None Available. FINDINGS: Brain: No acute infarction, hemorrhage, hydrocephalus, extra-axial collection or mass lesion. No pathologic enhancement. Moderate scattered T2/FLAIR hyperintensities in the white matter, nonspecific but compatible with chronic microvascular ischemic disease. Vascular: Major arterial flow voids are maintained at the skull base. Skull and upper cervical spine: Normal marrow signal. Sinuses/Orbits: Clear sinuses.  No acute orbital findings Other: Partially imaged 1.3 x 2.6 cm T1 hyperintense and heterogeneously T2 hyperintense mass in the left infratemporal fossa (for example see series 8, image 1). This mass is heterogeneous on DWI and ADC. IMPRESSION: 1. No evidence of acute abnormality or intracranial metastatic disease. 2. Partially imaged 1.3 x 2.6 cm T1 hyperintense and heterogeneously T2 hyperintense mass in the left infratemporal fossa, which is indeterminate. Differential considerations include complex/hemorrhagic cyst or malignancy (such as solitary fibrous tumor, atypical nerve sheath tumor, or leiomyoma). Metastatic disease is thought less likely. A maxillofacial CT with contrast may be helpful to further characterize. Electronically Signed   By: Margaretha Sheffield M.D.   On:  07/26/2022 10:34    Labs:  CBC: Recent Labs    04/14/22 1405 06/03/22 0916 07/03/22 0630 07/21/22 1010  WBC 8.7 10.0 7.9 7.9  HGB 12.6* 14.0 14.0 13.0  HCT 38.0* 42.0 42.0 38.7*  PLT 214 245 300 231     COAGS: Recent Labs    07/03/22 0630  INR 1.0     BMP: Recent Labs    04/14/22 1730 06/03/22 0916 07/21/22 1010  NA  --  135 137  K  --  3.0* 3.6  CL  --  102 105  CO2  --  21* 26  GLUCOSE  --  133* 118*  BUN  --  8 13  CALCIUM  --  9.2 8.7*  CREATININE 1.00 0.81 0.77  GFRNONAA  --  >60 >60     LIVER FUNCTION TESTS: Recent Labs    07/21/22 1010  BILITOT 0.6  AST 17  ALT 17  ALKPHOS 57  PROT 6.8  ALBUMIN 3.8    TUMOR MARKERS: No results for input(s): "AFPTM", "CEA", "CA199", "CHROMGRNA" in the last 8760 hours.  Assessment and Plan: 76 yo male presents to [facility] for Port-A-Cath placement today in the setting of recent diagnosis of metastatic adenocarcinoma.   Risks and benefits of core biopsy of inferior scapula with moderate sedation was discussed with the patient and/or patient's family including, but not limited to bleeding, infection, damage to adjacent structures or low yield requiring additional tests.  All of the questions were answered and there is agreement to proceed.  Consent signed and in chart.   Thank you for this interesting consult.  I greatly enjoyed [place] and look forward to participating  in their care.  A copy of this report was sent to the requesting provider on this date.  Electronically Signed: Docia Barrier, PA 08/04/2022, 11:42 AM   I spent a total of 20 minutes in face to face in clinical consultation, greater than 50% of which was counseling/coordinating care for metastatic adenocarcinoma.

## 2022-08-05 ENCOUNTER — Ambulatory Visit: Payer: Medicare Other

## 2022-08-06 ENCOUNTER — Other Ambulatory Visit: Payer: Self-pay

## 2022-08-06 ENCOUNTER — Ambulatory Visit: Payer: Medicare Other

## 2022-08-06 ENCOUNTER — Ambulatory Visit
Admission: RE | Admit: 2022-08-06 | Discharge: 2022-08-06 | Disposition: A | Discharge status: Home / Self Care | Payer: Medicare Other | Source: Ambulatory Visit | Attending: Radiation Oncology | Admitting: Radiation Oncology

## 2022-08-06 DIAGNOSIS — C7951 Secondary malignant neoplasm of bone: Secondary | ICD-10-CM | POA: Diagnosis not present

## 2022-08-06 DIAGNOSIS — C3412 Malignant neoplasm of upper lobe, left bronchus or lung: Secondary | ICD-10-CM | POA: Diagnosis not present

## 2022-08-06 DIAGNOSIS — Z510 Encounter for antineoplastic radiation therapy: Secondary | ICD-10-CM | POA: Diagnosis not present

## 2022-08-06 LAB — RAD ONC ARIA SESSION SUMMARY
Course Elapsed Days: 0
Plan Fractions Treated to Date: 1
Plan Fractions Treated to Date: 1
Plan Prescribed Dose Per Fraction: 12 Gy
Plan Prescribed Dose Per Fraction: 8 Gy
Plan Total Fractions Prescribed: 5
Plan Total Fractions Prescribed: 5
Plan Total Prescribed Dose: 40 Gy
Plan Total Prescribed Dose: 60 Gy
Reference Point Dosage Given to Date: 12 Gy
Reference Point Dosage Given to Date: 8 Gy
Reference Point Session Dosage Given: 12 Gy
Reference Point Session Dosage Given: 8 Gy
Session Number: 1

## 2022-08-07 ENCOUNTER — Ambulatory Visit: Payer: Medicare Other

## 2022-08-08 ENCOUNTER — Ambulatory Visit
Admission: RE | Admit: 2022-08-08 | Discharge: 2022-08-08 | Disposition: A | Discharge status: Home / Self Care | Payer: Medicare Other | Source: Ambulatory Visit | Attending: Radiation Oncology | Admitting: Radiation Oncology

## 2022-08-08 ENCOUNTER — Other Ambulatory Visit: Payer: Self-pay

## 2022-08-08 DIAGNOSIS — C3490 Malignant neoplasm of unspecified part of unspecified bronchus or lung: Secondary | ICD-10-CM

## 2022-08-08 DIAGNOSIS — C3412 Malignant neoplasm of upper lobe, left bronchus or lung: Secondary | ICD-10-CM | POA: Diagnosis not present

## 2022-08-08 LAB — RAD ONC ARIA SESSION SUMMARY
Course Elapsed Days: 2
Plan Fractions Treated to Date: 2
Plan Fractions Treated to Date: 2
Plan Prescribed Dose Per Fraction: 12 Gy
Plan Prescribed Dose Per Fraction: 8 Gy
Plan Total Fractions Prescribed: 5
Plan Total Fractions Prescribed: 5
Plan Total Prescribed Dose: 40 Gy
Plan Total Prescribed Dose: 60 Gy
Reference Point Dosage Given to Date: 16 Gy
Reference Point Dosage Given to Date: 24 Gy
Reference Point Session Dosage Given: 12 Gy
Reference Point Session Dosage Given: 8 Gy
Session Number: 2

## 2022-08-11 ENCOUNTER — Other Ambulatory Visit: Payer: Self-pay | Admitting: *Deleted

## 2022-08-11 ENCOUNTER — Inpatient Hospital Stay: Payer: Medicare Other | Attending: Physician Assistant

## 2022-08-11 ENCOUNTER — Ambulatory Visit: Payer: Medicare Other | Admitting: Radiation Oncology

## 2022-08-11 ENCOUNTER — Inpatient Hospital Stay: Payer: Medicare Other

## 2022-08-11 ENCOUNTER — Inpatient Hospital Stay (HOSPITAL_BASED_OUTPATIENT_CLINIC_OR_DEPARTMENT_OTHER): Payer: Medicare Other | Admitting: Hematology

## 2022-08-11 VITALS — BP 126/68 | HR 69 | Temp 97.8°F | Resp 16

## 2022-08-11 VITALS — BP 124/66 | HR 82 | Temp 97.8°F | Resp 18 | Ht 66.0 in | Wt 139.9 lb

## 2022-08-11 DIAGNOSIS — Z95828 Presence of other vascular implants and grafts: Secondary | ICD-10-CM

## 2022-08-11 DIAGNOSIS — D499 Neoplasm of unspecified behavior of unspecified site: Secondary | ICD-10-CM | POA: Diagnosis not present

## 2022-08-11 DIAGNOSIS — C3412 Malignant neoplasm of upper lobe, left bronchus or lung: Secondary | ICD-10-CM | POA: Diagnosis not present

## 2022-08-11 DIAGNOSIS — Z79899 Other long term (current) drug therapy: Secondary | ICD-10-CM | POA: Insufficient documentation

## 2022-08-11 DIAGNOSIS — C7951 Secondary malignant neoplasm of bone: Secondary | ICD-10-CM | POA: Diagnosis not present

## 2022-08-11 DIAGNOSIS — Z5112 Encounter for antineoplastic immunotherapy: Secondary | ICD-10-CM | POA: Insufficient documentation

## 2022-08-11 DIAGNOSIS — Z87891 Personal history of nicotine dependence: Secondary | ICD-10-CM | POA: Insufficient documentation

## 2022-08-11 LAB — CBC WITH DIFFERENTIAL/PLATELET
Abs Immature Granulocytes: 0.05 10*3/uL (ref 0.00–0.07)
Basophils Absolute: 0.1 10*3/uL (ref 0.0–0.1)
Basophils Relative: 1 %
Eosinophils Absolute: 0.2 10*3/uL (ref 0.0–0.5)
Eosinophils Relative: 2 %
HCT: 39.1 % (ref 39.0–52.0)
Hemoglobin: 12.8 g/dL — ABNORMAL LOW (ref 13.0–17.0)
Immature Granulocytes: 1 %
Lymphocytes Relative: 10 %
Lymphs Abs: 1 10*3/uL (ref 0.7–4.0)
MCH: 32.7 pg (ref 26.0–34.0)
MCHC: 32.7 g/dL (ref 30.0–36.0)
MCV: 99.7 fL (ref 80.0–100.0)
Monocytes Absolute: 0.7 10*3/uL (ref 0.1–1.0)
Monocytes Relative: 7 %
Neutro Abs: 7.9 10*3/uL — ABNORMAL HIGH (ref 1.7–7.7)
Neutrophils Relative %: 79 %
Platelets: 244 10*3/uL (ref 150–400)
RBC: 3.92 MIL/uL — ABNORMAL LOW (ref 4.22–5.81)
RDW: 12.4 % (ref 11.5–15.5)
WBC: 9.9 10*3/uL (ref 4.0–10.5)
nRBC: 0 % (ref 0.0–0.2)

## 2022-08-11 LAB — COMPREHENSIVE METABOLIC PANEL
ALT: 17 U/L (ref 0–44)
AST: 17 U/L (ref 15–41)
Albumin: 3.8 g/dL (ref 3.5–5.0)
Alkaline Phosphatase: 62 U/L (ref 38–126)
Anion gap: 10 (ref 5–15)
BUN: 17 mg/dL (ref 8–23)
CO2: 27 mmol/L (ref 22–32)
Calcium: 9.1 mg/dL (ref 8.9–10.3)
Chloride: 99 mmol/L (ref 98–111)
Creatinine, Ser: 0.86 mg/dL (ref 0.61–1.24)
GFR, Estimated: >60 mL/min (ref >60)
Glucose, Bld: 125 mg/dL — ABNORMAL HIGH (ref 70–99)
Potassium: 4 mmol/L (ref 3.5–5.1)
Sodium: 136 mmol/L (ref 135–145)
Total Bilirubin: 0.5 mg/dL (ref 0.3–1.2)
Total Protein: 7 g/dL (ref 6.5–8.1)

## 2022-08-11 MED ORDER — SODIUM CHLORIDE 0.9 % IV SOLN
200.0000 mg | Freq: Once | INTRAVENOUS | Status: AC
  Administered 2022-08-11: 200 mg via INTRAVENOUS
  Filled 2022-08-11: qty 8

## 2022-08-11 MED ORDER — SODIUM CHLORIDE 0.9% FLUSH
10.0000 mL | INTRAVENOUS | Status: DC | PRN
  Administered 2022-08-11: 10 mL

## 2022-08-11 MED ORDER — HEPARIN SOD (PORK) LOCK FLUSH 100 UNIT/ML IV SOLN
500.0000 [IU] | Freq: Once | INTRAVENOUS | Status: AC | PRN
  Administered 2022-08-11: 500 [IU]

## 2022-08-11 MED ORDER — SODIUM CHLORIDE 0.9 % IV SOLN: Freq: Once | INTRAVENOUS | Status: AC

## 2022-08-11 NOTE — Progress Notes (Signed)
Pt presents today for Keytruda per provider's order. Vital signs and labs WNL for treatment. Okay to proceed with treatment today per Dr.K.  Beryle Flock given today per MD orders. Tolerated infusion without adverse affects. Vital signs stable. No complaints at this time. Discharged from clinic ambulatory in stable condition. Alert and oriented x 3. F/U with [facility] as scheduled.

## 2022-08-11 NOTE — Progress Notes (Signed)
[address] [place], [place] [address]   CLINIC:  Medical Oncology/Hematology  PCP:  [facility], [place] / [place] [address] [phone]   REASON FOR VISIT:  Follow-up for history of bilateral lung cancers, normocytic anemia, and B12 deficiency  PRIOR THERAPY: - Right upper lobectomy (February 2012) for right lung cancer - SBRT (November 2015) for stage I left adenocarcinoma  NGS Results: Not done  CURRENT THERAPY: Surveillance  BRIEF ONCOLOGIC HISTORY:  Oncology History  Adenocarcinoma of lung ([place])  09/30/2010 Cancer Staging   CT of chest showed spiculated lesion   10/15/2010 Surgery   Right upper lobectomy by Dr. Arlyce Dice   10/16/2010 Remission     10/24/2013 Imaging   CT Chest-  Enlarging subpleural nodule in the left lower lobe, now 9 mm, with distortion of the overlying pleura. Finding is concerning for a small bronchogenic carcinoma   01/23/2014 Imaging   CT Chest- Left lower lobe nodule has enlarged from 11/01/2012 and is worrisome for primary bronchogenic carcinoma.    07/04/2014 - 07/10/2014 Radiation Therapy   SBRT by Dr. Isidore Moos in 3 fractions.   07/05/2015 Imaging   perifissural nodule in LLL appears slightly smaller with parenchymal opacity attrib to XRT, likely inflamm pathcy airspace dz more inferiorly in LLL, airspace dz in RLL resolved. no metastatic disease   Primary cancer of left lower lobe of lung ([facility])  06/18/2014 Initial Diagnosis   Left lower lobe nodule suspicious for primary lung carcinoma   07/04/2014 - 07/10/2014 Radiation Therapy   SBRT   10/12/2014 Imaging   No acute findings within the chest. Decrease in size of left lower lobe perifissural nodule compatible with response to therapy. No new or progressive disease identified within the chest.   Adenocarcinoma of upper lobe of left lung ([facility])  07/16/2022 Initial Diagnosis   Adenocarcinoma of upper lobe of left lung ([place])   07/21/2022 -   Chemotherapy   Patient is on Treatment Plan : LUNG NSCLC Pembrolizumab (200) q21d       CANCER STAGING:  Cancer Staging  Adenocarcinoma of lung ([place]) Staging form: Lung, AJCC 7th Edition - Clinical: Stage IA (T1a, N0, M0) - Signed by Baird Cancer, PA on 05/06/2011  Adenocarcinoma of upper lobe of left lung ([facility]) Staging form: Lung, AJCC 8th Edition - Clinical stage from 07/16/2022: Stage IVA (cT1b, cN0, pM1b) - Unsigned   INTERVAL HISTORY:  Maurice Orozco, a 76 y.o. male, seen for follow-up of metastatic lung cancer and toxicity assessment prior to next cycle of pembrolizumab.  Cycle 1 of pembrolizumab was on 07/21/2022.  He started radiation therapy to the right scapula and lung lesion on 08/06/2022.  After 1 treatment, scapular pain has completely resolved.  She reports on and off rash in the lower back at nighttime even prior to start of Keytruda.  He was using triamcinolone cream as needed.  He reported nosebleeds few times in the last couple of weeks, mostly from the left nostril.  REVIEW OF SYSTEMS:    Review of Systems  HENT:   Positive for nosebleeds.   Respiratory:  Positive for cough and shortness of breath (COPD). Negative for hemoptysis.   Neurological:  Negative for dizziness, headaches and light-headedness.  Hematological:  Does not bruise/bleed easily.    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 dense RCA calcification --Non-ischemic Myoview   History of kidney  stones    in the 70's   History of radiation therapy 07/04/14, 07/06/14, 07/10/14   LLL lung nodule/54 Gy/3 fx- SBRT   Hypercholesterolemia 05/06/2011   Pneumonia due to Streptococcus    HX. POST OPERATIVELY   Port-A-Cath in place 07/21/2022   Pre-diabetes    Stress fx pelvis    DUE TO AA IN 1970   Past Surgical History:  Procedure Laterality Date   BACK SURGERY  07/04/2017   fusion above previous surgery   BACK SURGERY  2011   metal plate l-4,L5   BRONCHIAL BIOPSY  06/03/2022   Procedure: BRONCHIAL BIOPSIES;  Surgeon: Garner Nash, DO;  Location: [facility];  Service: Pulmonary;;   BRONCHIAL BRUSHINGS  06/03/2022   Procedure: BRONCHIAL BRUSHINGS;  Surgeon: Garner Nash, DO;  Location: [facility];  Service: Pulmonary;;   BRONCHIAL NEEDLE ASPIRATION BIOPSY  06/03/2022   Procedure: BRONCHIAL NEEDLE ASPIRATION BIOPSIES;  Surgeon: Garner Nash, DO;  Location: [facility];  Service: Pulmonary;;   Coroanry Calcium Score  03/2017   1043. Noted especially in RCA. Dense calcification, recommend Myoview over Cor CTA.   FIDUCIAL MARKER PLACEMENT  06/03/2022   Procedure: FIDUCIAL MARKER PLACEMENT;  Surgeon: Garner Nash, DO;  Location: [facility];  Service: Pulmonary;;   IR IMAGING GUIDED PORT INSERTION  08/04/2022   LIPOMA EXCISION N/A 10/14/2017   Procedure: EXCISION LIPOMA ON BACK;  Surgeon: Virl Cagey, MD;  Location: [facility];  Service: General;  Laterality: N/A;   LIPOMA EXCISION Left 11/19/2018   Procedure: MINOR EXCISION OF SEBACEOUS CYST NECK;  Surgeon: Virl Cagey, MD;  Location: [facility];  Service: General;  Laterality: Left;  pt knows to arrive at 7:00   LUNG LOBECTOMY  2012   RT [place]  04/2017   EF 60%. Hypertensive response to exercise. (6:21 min, 7.7 METs) No EKG changes. LOW RISK    SOCIAL HISTORY:  Social History   Socioeconomic History   Marital status: Married    Spouse name: Not on file   Number of children: 3    Years of education: Not on file   Highest education level: Not on file  Occupational History   Not on file  Tobacco Use   Smoking status: Former    Packs/day: 1.50    Years: 59.00    Total pack years: 88.50    Types: Cigarettes    Quit date: 09/08/2010    Years since quitting: 11.9   Smokeless tobacco: Current    Types: Chew   Tobacco comments:    still chews tobacco, he tells me he hasn't in the past 2 days.   Vaping Use   Vaping Use: Never used  Substance and Sexual Activity   Alcohol use: No   Drug use: No   Sexual activity: Not Currently  Other Topics Concern   Not on file  Social History Narrative   Not on file   Social Determinants  of Health   Financial Resource Strain: Not on file  Food Insecurity: Not on file  Transportation Needs: No Transportation Needs (07/13/2018)   PRAPARE - Hydrologist (Medical): No    Lack of Transportation (Non-Medical): No  Physical Activity: Not on file  Stress: Not on file  Social Connections: Not on file  Intimate Partner Violence: Not At Risk (07/13/2018)   Humiliation, Afraid, Rape, and Kick questionnaire    Fear of Current or Ex-Partner: No    Emotionally Abused: No    Physically Abused: No    Sexually Abused: No    FAMILY HISTORY:  Family History  Problem Relation Age of Onset   Hypertension Mother    Kidney disease Father    Heart attack Brother    Cancer Maternal Uncle        prostate cancer   Cancer Maternal Uncle        prostate cancer   Cancer Maternal Uncle        prostate cancer   Cancer Maternal Uncle        prostate cancer    CURRENT MEDICATIONS:  Current Outpatient Medications  Medication Sig Dispense Refill   albuterol (PROVENTIL HFA;VENTOLIN HFA) 108 (90 Base) MCG/ACT inhaler Inhale 2 puffs into the lungs every 4 (four) hours as needed for wheezing or shortness of breath. 2 Inhaler 3   albuterol (PROVENTIL) (2.5 MG/3ML) 0.083% nebulizer solution Take 2.5 mg by nebulization  every 6 (six) hours as needed for wheezing or shortness of breath.     cholecalciferol (VITAMIN D3) 25 MCG (1000 UNIT) tablet Take 1,000 Units by mouth daily.     cimetidine (TAGAMET) 200 MG tablet Take 400-600 mg by mouth daily as needed (for heartburn).     ferrous sulfate 325 (65 FE) MG tablet Take 325 mg by mouth daily with breakfast.     fluticasone (FLONASE) 50 MCG/ACT nasal spray Place 2 sprays into both nostrils daily as needed for rhinitis.     guaiFENesin (MUCINEX) 600 MG 12 hr tablet Take 600 mg by mouth daily.     hydrochlorothiazide (HYDRODIURIL) 25 MG tablet Take 12.5 mg by mouth daily.     HYDROcodone-acetaminophen (NORCO) 10-325 MG tablet Take 1-2 tablets by mouth every 4 (four) hours as needed for moderate pain. 240 tablet 0   lidocaine-prilocaine (EMLA) cream Apply a small amount to port a cath site and cover with plastic wrap 1 hour prior to infusion appointments 30 g 3   Menthol, Topical Analgesic, (ICY HOT EX) Apply 1 Application topically daily as needed (pain).     naproxen sodium (ALEVE) 220 MG tablet Take 220 mg by mouth daily as needed (pain).     Omega-3 Fatty Acids (FISH OIL) 1000 MG CAPS Take 1,000 mg by mouth daily.     Oxycodone HCl 10 MG TABS Take 10 mg by mouth 2 (two) times daily as needed (pain).     pantoprazole (PROTONIX) 40 MG tablet Take 40 mg by mouth See admin instructions. Take 40 mg daily, may take a second 40 mg dose as needed for heartburn     PEMBROLIZUMAB IV Inject into the vein every 21 ( twenty-one) days.     polyethylene glycol powder (GLYCOLAX/MIRALAX) 17 GM/SCOOP powder Take 17 g by mouth daily as needed for mild constipation or moderate constipation.     prochlorperazine (COMPAZINE) 10 MG tablet Take 1 tablet (10 mg total) by mouth every 6 (six) hours as needed for nausea or vomiting.  30 tablet 1   rosuvastatin (CRESTOR) 40 MG tablet Take 40 mg by mouth at bedtime.     triamcinolone cream (KENALOG) 0.1 % Apply 1 Application topically daily as  needed (tick bite/itching).     triazolam (HALCION) 0.25 MG tablet Take 0.25 mg by mouth at bedtime.     vitamin B-12 (CYANOCOBALAMIN) 1000 MCG tablet Take 1,000 mcg by mouth every other day.     No current facility-administered medications for this visit.    ALLERGIES:  Allergies  Allergen Reactions   Fentanyl Other (See Comments)    CAUSED HALLUCINATIONS/05-03-13 pt reports it was oral fentanyl that caused hallucinations    Gabapentin Other (See Comments)    Bones throbbed, headaches, weakness   Nabumetone     Unknown reaction    Aspirin Nausea Only    PHYSICAL EXAM:    Performance status (ECOG): 1 - Symptomatic but completely ambulatory  There were no vitals filed for this visit.  Wt Readings from Last 3 Encounters:  08/04/22 140 lb (63.5 kg)  07/25/22 140 lb 4 oz (63.6 kg)  07/21/22 143 lb (64.9 kg)   Physical Exam Constitutional:      Appearance: Normal appearance.  HENT:     Head: Normocephalic and atraumatic.     Mouth/Throat:     Mouth: Mucous membranes are moist.  Eyes:     Extraocular Movements: Extraocular movements intact.     Pupils: Pupils are equal, round, and reactive to light.  Cardiovascular:     Rate and Rhythm: Normal rate and regular rhythm.     Pulses: Normal pulses.     Heart sounds: Normal heart sounds.  Pulmonary:     Effort: Pulmonary effort is normal.     Breath sounds: Decreased breath sounds present.     Comments: Coarse breath sounds and decreased air movement in all lung fields. Abdominal:     General: Bowel sounds are normal.     Palpations: Abdomen is soft.     Tenderness: There is no abdominal tenderness.  Musculoskeletal:        General: No swelling.     Right lower leg: No edema.     Left lower leg: No edema.  Lymphadenopathy:     Cervical: No cervical adenopathy.  Skin:    General: Skin is warm and dry.  Neurological:     General: No focal deficit present.     Mental Status: He is alert and oriented to person, place,  and time.  Psychiatric:        Mood and Affect: Mood normal.        Behavior: Behavior normal.     LABORATORY DATA:  I have reviewed the labs as listed.     Latest Ref Rng & Units 08/11/2022   10:00 AM 07/21/2022   10:10 AM 07/03/2022    6:30 AM  CBC  WBC 4.0 - 10.5 K/uL 9.9  7.9  7.9   Hemoglobin 13.0 - 17.0 g/dL 12.8  13.0  14.0   Hematocrit 39.0 - 52.0 % 39.1  38.7  42.0   Platelets 150 - 400 K/uL 244  231  300       Latest Ref Rng & Units 08/11/2022   10:00 AM 07/21/2022   10:10 AM 06/03/2022    9:16 AM  CMP  Glucose 70 - 99 mg/dL 125  118  133   BUN 8 - 23 mg/dL _0 Creatinine 0.61 - 1.24 mg/dL 0.86  0.77  0.81   Sodium 135 - 145 mmol/L 136  137  135   Potassium 3.5 - 5.1 mmol/L 4.0  3.6  3.0   Chloride 98 - 111 mmol/L 99  105  102   CO2 22 - 32 mmol/L _0 Calcium 8.9 - 10.3 mg/dL 9.1  8.7  9.2   Total Protein 6.5 - 8.1 g/dL 7.0  6.8    Total Bilirubin 0.3 - 1.2 mg/dL 0.5  0.6    Alkaline Phos 38 - 126 U/L 62  57    AST 15 - 41 U/L 17  17    ALT 0 - 44 U/L 17  17      DIAGNOSTIC IMAGING:  I have independently reviewed the scans and discussed with the patient. IR IMAGING GUIDED PORT INSERTION  Result Date: 08/04/2022 INDICATION: LEFT lung cancer. EXAM: IMPLANTED PORT A CATH PLACEMENT WITH ULTRASOUND AND FLUOROSCOPIC GUIDANCE MEDICATIONS: None ANESTHESIA/SEDATION: Moderate (conscious) sedation was employed during this procedure. A total of Versed 2 mg and Dilaudid 1 mg was administered intravenously. Moderate Sedation Time: 26 minutes. The patient's level of consciousness and vital signs were monitored continuously by radiology nursing throughout the procedure under my direct supervision. FLUOROSCOPY TIME:  Fluoroscopic dose; 1.2 mGy COMPLICATIONS: None immediate. PROCEDURE: The procedure, risks, benefits, and alternatives were explained to the patient. Questions regarding the procedure were encouraged and answered. The patient understands and consents to  the procedure. The RIGHT neck and chest were prepped with chlorhexidine in a sterile fashion, and a sterile drape was applied covering the operative field. Maximum barrier sterile technique with sterile gowns and gloves were used for the procedure. A timeout was performed prior to the initiation of the procedure. Local anesthesia was provided with 1% lidocaine with epinephrine. After creating a small venotomy incision, a micropuncture kit was utilized to access the internal jugular vein under direct, real-time ultrasound guidance. Ultrasound image documentation was performed. The microwire was kinked to measure appropriate catheter length. A subcutaneous port pocket was then created along the upper chest wall utilizing a combination of sharp and blunt dissection. The pocket was irrigated with sterile saline. A single lumen ISP power injectable port was chosen for placement. The 8 Fr catheter was tunneled from the port pocket site to the venotomy incision. The port was placed in the pocket. The external catheter was trimmed to appropriate length. At the venotomy, an 8 Fr peel-away sheath was placed over a guidewire under fluoroscopic guidance. The catheter was then placed through the sheath and the sheath was removed. Final catheter positioning was confirmed and documented with a fluoroscopic spot radiograph. The port was accessed with a Huber needle, aspirated and flushed with heparinized saline. The port pocket incision was closed with interrupted 3-0 Vicryl suture then Dermabond was applied, including at the venotomy incision. Dressings were placed. The patient tolerated the procedure well without immediate post procedural complication. IMPRESSION: Successful placement of a RIGHT internal jugular approach power injectable Port-A-Cath. The tip of the catheter is positioned within the proximal RIGHT atrium. The catheter is ready for immediate use. Michaelle Birks, MD [facility]  [facility] Radiology Electronically Signed   By: Michaelle Birks M.D.   On: 08/04/2022 18:37   MR Brain W Wo Contrast  Result Date: 07/26/2022 CLINICAL DATA:  Metastatic disease evaluation metastatic adenocarcinoma of the lung EXAM: MRI HEAD WITHOUT AND WITH CONTRAST TECHNIQUE: Multiplanar, multiecho pulse sequences of the brain and surrounding structures were obtained  without and with intravenous contrast. CONTRAST:  6.38m GADAVIST GADOBUTROL 1 MMOL/ML IV SOLN COMPARISON:  None Available. FINDINGS: Brain: No acute infarction, hemorrhage, hydrocephalus, extra-axial collection or mass lesion. No pathologic enhancement. Moderate scattered T2/FLAIR hyperintensities in the white matter, nonspecific but compatible with chronic microvascular ischemic disease. Vascular: Major arterial flow voids are maintained at the skull base. Skull and upper cervical spine: Normal marrow signal. Sinuses/Orbits: Clear sinuses.  No acute orbital findings Other: Partially imaged 1.3 x 2.6 cm T1 hyperintense and heterogeneously T2 hyperintense mass in the left infratemporal fossa (for example see series 8, image 1). This mass is heterogeneous on DWI and ADC. IMPRESSION: 1. No evidence of acute abnormality or intracranial metastatic disease. 2. Partially imaged 1.3 x 2.6 cm T1 hyperintense and heterogeneously T2 hyperintense mass in the left infratemporal fossa, which is indeterminate. Differential considerations include complex/hemorrhagic cyst or malignancy (such as solitary fibrous tumor, atypical nerve sheath tumor, or leiomyoma). Metastatic disease is thought less likely. A maxillofacial CT with contrast may be helpful to further characterize. Electronically Signed   By: FMargaretha SheffieldM.D.   On: 07/26/2022 10:34     ASSESSMENT:  1.  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 - Status post right upper lobectomy in February 2012 for right lung cancer. -  LDCT chest (04/14/2022): Enlarging spiculated nodular soft tissue thickening in the posterior left upper lobe measuring 11.4 mm. - PET scan (04/24/2022): Left upper lobe lung nodule 14 mm with SUV 3.62.  Hypermetabolic focus in the inferior aspect of the right scapula without discrete CT correlate with SUV 5.71.  No hypermetabolic adenopathy. - LUL lesion FNA (06/03/2022): Adenocarcinoma - Right scapula biopsy (07/03/2022) adenocarcinoma consistent with lung primary - NGS: PD-L1 22 C3 TPS 100%, T p53 pathogenic variant, MSI-stable, TMB-low, no other targetable mutations - Cycle 1 of pembrolizumab started on 07/21/2022  2.  Cystic lesion of left kidney - CTA PE on 04/14/2022 showed complex cystic lesion of the left kidney.  PET scan also showed exophytic 18 mm left renal lesion suspicious for malignancy. - MRI abdomen on 05/16/2022 showed benign appearing Bosniak category 1 and 2 left renal cyst with no evidence of neoplasm.  No further work-up needed.  PLAN:   1.  Metastatic adenocarcinoma of the left upper lobe of the lung to the scapula: - Cycle 1 of Keytruda on 07/21/2022. - He did not have any immunotherapy related side effects. - Reviewed labs today which showed normal LFTs.  CBC was grossly normal. - Proceed with cycle 2 of Keytruda today. - Reviewed MRI brain (07/24/2022): No evidence of intracranial metastatic disease.  However incidental 1.3 x 2.6 cm heterogeneously T2 hyperintense mass in the left infratemporal fossa which is indeterminate. - Will order a maxillofacial CT with contrast to further characterize.  RTC 3 weeks for follow-up.   2.  Right scapular pain: -XRT to the right scapular lesion started on 08/06/2022.  He received 2/5 fractions. - His pain has improved after first treatment.  He is not requiring narcotics for it.   3.  B12 deficiency - Continue B12 every other day and iron tablet twice daily.  4.  Chronic back pain: - He has chronic back pain of 20+ years duration.   Continue narcotics with Dr. FNolon Rodoffice.     All questions were answered. The patient knows to call the clinic with any problems, questions or concerns.    SDerek Jack MD

## 2022-08-11 NOTE — Progress Notes (Signed)
Patients port flushed without difficulty.  Good blood return noted with no bruising or swelling noted at site.  Stable during access and blood draw.  Patient to remain accessed for treatment. 

## 2022-08-11 NOTE — Patient Instructions (Signed)
[place]  Discharge Instructions: Thank you for choosing [facility] to provide your oncology and hematology care.  If you have a lab appointment with the [facility], please come in thru the Main Entrance and check in at the main information desk.  Wear comfortable clothing and clothing appropriate for easy access to any Portacath or PICC line.   We strive to give you quality time with your provider. You may need to reschedule your appointment if you arrive late (15 or more minutes).  Arriving late affects you and other patients whose appointments are after yours.  Also, if you miss three or more appointments without notifying the office, you may be dismissed from the clinic at the provider's discretion.      For prescription refill requests, have your pharmacy contact our office and allow 72 hours for refills to be completed.    Today you received the following chemotherapy and/or immunotherapy agents Keytruda   To help prevent nausea and vomiting after your treatment, we encourage you to take your nausea medication as directed.  Pembrolizumab Injection What is this medication? PEMBROLIZUMAB (PEM broe LIZ ue mab) treats some types of cancer. It works by helping your immune system slow or stop the spread of cancer cells. It is a monoclonal antibody. This medicine may be used for other purposes; ask your health care provider or pharmacist if you have questions. COMMON BRAND NAME(S): Keytruda What should I tell my care team before I take this medication? They need to know if you have any of these conditions: Allogeneic stem cell transplant (uses someone else's stem cells) Autoimmune diseases, such as Crohn disease, ulcerative colitis, lupus History of chest radiation Nervous system problems, such as Guillain-Barre syndrome, myasthenia gravis Organ transplant An unusual or allergic reaction to pembrolizumab, other medications, foods, dyes, or  preservatives Pregnant or trying to get pregnant Breast-feeding How should I use this medication? This medication is injected into a vein. It is given by your care team in a hospital or clinic setting. A special MedGuide will be given to you before each treatment. Be sure to read this information carefully each time. Talk to your care team about the use of this medication in children. While it may be prescribed for children as young as 6 months for selected conditions, precautions do apply. Overdosage: If you think you have taken too much of this medicine contact a poison control center or emergency room at once. NOTE: This medicine is only for you. Do not share this medicine with others. What if I miss a dose? Keep appointments for follow-up doses. It is important not to miss your dose. Call your care team if you are unable to keep an appointment. What may interact with this medication? Interactions have not been studied. This list may not describe all possible interactions. Give your health care provider a list of all the medicines, herbs, non-prescription drugs, or dietary supplements you use. Also tell them if you smoke, drink alcohol, or use illegal drugs. Some items may interact with your medicine. What should I watch for while using this medication? Your condition will be monitored carefully while you are receiving this medication. You may need blood work while taking this medication. This medication may cause serious skin reactions. They can happen weeks to months after starting the medication. Contact your care team right away if you notice fevers or flu-like symptoms with a rash. The rash may be red or purple and then turn into  blisters or peeling of the skin. You may also notice a red rash with swelling of the face, lips, or lymph nodes in your neck or under your arms. Tell your care team right away if you have any change in your eyesight. Talk to your care team if you may be pregnant.  Serious birth defects can occur if you take this medication during pregnancy and for 4 months after the last dose. You will need a negative pregnancy test before starting this medication. Contraception is recommended while taking this medication and for 4 months after the last dose. Your care team can help you find the option that works for you. Do not breastfeed while taking this medication and for 4 months after the last dose. What side effects may I notice from receiving this medication? Side effects that you should report to your care team as soon as possible: Allergic reactions--skin rash, itching, hives, swelling of the face, lips, tongue, or throat Dry cough, shortness of breath or trouble breathing Eye pain, redness, irritation, or discharge with blurry or decreased vision Heart muscle inflammation--unusual weakness or fatigue, shortness of breath, chest pain, fast or irregular heartbeat, dizziness, swelling of the ankles, feet, or hands Hormone gland problems--headache, sensitivity to light, unusual weakness or fatigue, dizziness, fast or irregular heartbeat, increased sensitivity to cold or heat, excessive sweating, constipation, hair loss, increased thirst or amount of urine, tremors or shaking, irritability Infusion reactions--chest pain, shortness of breath or trouble breathing, feeling faint or lightheaded Kidney injury (glomerulonephritis)--decrease in the amount of urine, red or dark brown urine, foamy or bubbly urine, swelling of the ankles, hands, or feet Liver injury--right upper belly pain, loss of appetite, nausea, light-colored stool, dark yellow or brown urine, yellowing skin or eyes, unusual weakness or fatigue Pain, tingling, or numbness in the hands or feet, muscle weakness, change in vision, confusion or trouble speaking, loss of balance or coordination, trouble walking, seizures Rash, fever, and swollen lymph nodes Redness, blistering, peeling, or loosening of the skin,  including inside the mouth Sudden or severe stomach pain, bloody diarrhea, fever, nausea, vomiting Side effects that usually do not require medical attention (report to your care team if they continue or are bothersome): Bone, joint, or muscle pain Diarrhea Fatigue Loss of appetite Nausea Skin rash This list may not describe all possible side effects. Call your doctor for medical advice about side effects. You may report side effects to FDA at 1-800-FDA-1088. Where should I keep my medication? This medication is given in a hospital or clinic. It will not be stored at home. NOTE: This sheet is a summary. It may not cover all possible information. If you have questions about this medicine, talk to your doctor, pharmacist, or health care provider.  2023 Elsevier/Gold Standard (2013-05-16 00:00:00)   BELOW ARE SYMPTOMS THAT SHOULD BE REPORTED IMMEDIATELY: *FEVER GREATER THAN 100.4 F (38 C) OR HIGHER *CHILLS OR SWEATING *NAUSEA AND VOMITING THAT IS NOT CONTROLLED WITH YOUR NAUSEA MEDICATION *UNUSUAL SHORTNESS OF BREATH *UNUSUAL BRUISING OR BLEEDING *URINARY PROBLEMS (pain or burning when urinating, or frequent urination) *BOWEL PROBLEMS (unusual diarrhea, constipation, pain near the anus) TENDERNESS IN MOUTH AND THROAT WITH OR WITHOUT PRESENCE OF ULCERS (sore throat, sores in mouth, or a toothache) UNUSUAL RASH, SWELLING OR PAIN  UNUSUAL VAGINAL DISCHARGE OR ITCHING   Items with * indicate a potential emergency and should be followed up as soon as possible or go to the Emergency Department if any problems should occur.  Please show the CHEMOTHERAPY  ALERT CARD or IMMUNOTHERAPY ALERT CARD at check-in to the Emergency Department and triage nurse.  Should you have questions after your visit or need to cancel or reschedule your appointment, please contact [facility] [phone]  and follow the prompts.  Office hours are 8:00 a.m. to 4:30 p.m. Monday - Friday. Please  note that voicemails left after 4:00 p.m. may not be returned until the following business day.  We are closed weekends and major holidays. You have access to a nurse at all times for urgent questions. Please call the main number to the clinic [phone] and follow the prompts.  For any non-urgent questions, you may also contact your provider using MyChart. We now offer e-Visits for anyone 24 and older to request care online for non-urgent symptoms. For details visit [email or web address].   Also download the MyChart app! Go to the app store, search "MyChart", open the app, select [facility], and log in with your MyChart username and password.  Masks are optional in the cancer centers. If you would like for your care team to wear a mask while they are taking care of you, please let them know. You may have one support person who is at least 75 years old accompany you for your appointments.

## 2022-08-11 NOTE — Patient Instructions (Addendum)
[facility] at [facility] Discharge Instructions   You were seen and examined today by Dr. Delton Coombes.  He reviewed the results of your lab work which are normal/stable.   We will proceed with your treatment today.   Try using a humidifier at home to see if this helps with your nosebleeds.   Return as scheduled.        Thank you for choosing [facility] at [facility] to provide your oncology and hematology care.  To afford each patient quality time with our provider, please arrive at least 15 minutes before your scheduled appointment time.   If you have a lab appointment with the [facility] please come in thru the Main Entrance and check in at the main information desk.  You need to re-schedule your appointment should you arrive 10 or more minutes late.  We strive to give you quality time with our providers, and arriving late affects you and other patients whose appointments are after yours.  Also, if you no show three or more times for appointments you may be dismissed from the clinic at the providers discretion.     Again, thank you for choosing [facility].  Our hope is that these requests will decrease the amount of time that you wait before being seen by our physicians.       _____________________________________________________________  Should you have questions after your visit to [facility], please contact our office at [phone] and follow the prompts.  Our office hours are 8:00 a.m. and 4:30 p.m. Monday - Friday.  Please note that voicemails left after 4:00 p.m. may not be returned until the following business day.  We are closed weekends and major holidays.  You do have access to a nurse 24-7, just call the main number to the clinic [phone] and do not press any options, hold on the line and a nurse will answer the phone.    For prescription refill requests, have your pharmacy contact our  office and allow 72 hours.    Due to Covid, you will need to wear a mask upon entering the hospital. If you do not have a mask, a mask will be given to you at the Main Entrance upon arrival. For doctor visits, patients may have 1 support person age 40 or older with them. For treatment visits, patients can not have anyone with them due to social distancing guidelines and our immunocompromised population.

## 2022-08-11 NOTE — Progress Notes (Signed)
Patient has been examined by Dr. Katragadda, and vital signs and labs have been reviewed. ANC, Creatinine, LFTs, hemoglobin, and platelets are within treatment parameters per M.D. - pt may proceed with treatment.  Primary RN and pharmacy notified.  

## 2022-08-11 NOTE — Progress Notes (Signed)
Labs were drawn in outpt. Lab.

## 2022-08-12 ENCOUNTER — Ambulatory Visit: Payer: Medicare Other

## 2022-08-13 ENCOUNTER — Ambulatory Visit: Payer: Medicare Other

## 2022-08-13 ENCOUNTER — Ambulatory Visit
Admission: RE | Admit: 2022-08-13 | Discharge: 2022-08-13 | Disposition: A | Discharge status: Home / Self Care | Payer: Medicare Other | Source: Ambulatory Visit | Attending: Radiation Oncology | Admitting: Radiation Oncology

## 2022-08-13 ENCOUNTER — Other Ambulatory Visit: Payer: Self-pay

## 2022-08-13 DIAGNOSIS — C3412 Malignant neoplasm of upper lobe, left bronchus or lung: Secondary | ICD-10-CM | POA: Diagnosis not present

## 2022-08-13 LAB — RAD ONC ARIA SESSION SUMMARY
Course Elapsed Days: 7
Plan Fractions Treated to Date: 3
Plan Fractions Treated to Date: 3
Plan Prescribed Dose Per Fraction: 12 Gy
Plan Prescribed Dose Per Fraction: 8 Gy
Plan Total Fractions Prescribed: 5
Plan Total Fractions Prescribed: 5
Plan Total Prescribed Dose: 40 Gy
Plan Total Prescribed Dose: 60 Gy
Reference Point Dosage Given to Date: 24 Gy
Reference Point Dosage Given to Date: 36 Gy
Reference Point Session Dosage Given: 12 Gy
Reference Point Session Dosage Given: 8 Gy
Session Number: 3

## 2022-08-14 ENCOUNTER — Ambulatory Visit: Payer: Medicare Other

## 2022-08-15 ENCOUNTER — Ambulatory Visit: Payer: Medicare Other

## 2022-08-15 ENCOUNTER — Ambulatory Visit
Admission: RE | Admit: 2022-08-15 | Discharge: 2022-08-15 | Disposition: A | Discharge status: Home / Self Care | Payer: Medicare Other | Source: Ambulatory Visit | Attending: Radiation Oncology | Admitting: Radiation Oncology

## 2022-08-15 ENCOUNTER — Other Ambulatory Visit: Payer: Self-pay

## 2022-08-15 DIAGNOSIS — C3412 Malignant neoplasm of upper lobe, left bronchus or lung: Secondary | ICD-10-CM | POA: Diagnosis not present

## 2022-08-15 LAB — RAD ONC ARIA SESSION SUMMARY
Course Elapsed Days: 9
Plan Fractions Treated to Date: 4
Plan Fractions Treated to Date: 4
Plan Prescribed Dose Per Fraction: 12 Gy
Plan Prescribed Dose Per Fraction: 8 Gy
Plan Total Fractions Prescribed: 5
Plan Total Fractions Prescribed: 5
Plan Total Prescribed Dose: 40 Gy
Plan Total Prescribed Dose: 60 Gy
Reference Point Dosage Given to Date: 32 Gy
Reference Point Dosage Given to Date: 48 Gy
Reference Point Session Dosage Given: 12 Gy
Reference Point Session Dosage Given: 8 Gy
Session Number: 4

## 2022-08-17 ENCOUNTER — Other Ambulatory Visit: Payer: Self-pay

## 2022-08-18 ENCOUNTER — Ambulatory Visit
Admission: RE | Admit: 2022-08-18 | Discharge: 2022-08-18 | Disposition: A | Discharge status: Home / Self Care | Payer: Medicare Other | Source: Ambulatory Visit | Attending: Radiation Oncology | Admitting: Radiation Oncology

## 2022-08-18 ENCOUNTER — Other Ambulatory Visit: Payer: Self-pay

## 2022-08-18 DIAGNOSIS — M5136 Other intervertebral disc degeneration, lumbar region: Secondary | ICD-10-CM | POA: Diagnosis not present

## 2022-08-18 DIAGNOSIS — C7951 Secondary malignant neoplasm of bone: Secondary | ICD-10-CM | POA: Diagnosis not present

## 2022-08-18 DIAGNOSIS — M1991 Primary osteoarthritis, unspecified site: Secondary | ICD-10-CM | POA: Diagnosis not present

## 2022-08-18 DIAGNOSIS — J449 Chronic obstructive pulmonary disease, unspecified: Secondary | ICD-10-CM | POA: Diagnosis not present

## 2022-08-18 DIAGNOSIS — Z510 Encounter for antineoplastic radiation therapy: Secondary | ICD-10-CM | POA: Diagnosis not present

## 2022-08-18 DIAGNOSIS — C3432 Malignant neoplasm of lower lobe, left bronchus or lung: Secondary | ICD-10-CM | POA: Diagnosis not present

## 2022-08-18 DIAGNOSIS — C3412 Malignant neoplasm of upper lobe, left bronchus or lung: Secondary | ICD-10-CM | POA: Diagnosis not present

## 2022-08-18 DIAGNOSIS — M48062 Spinal stenosis, lumbar region with neurogenic claudication: Secondary | ICD-10-CM | POA: Diagnosis not present

## 2022-08-18 DIAGNOSIS — Z6822 Body mass index (BMI) 22.0-22.9, adult: Secondary | ICD-10-CM | POA: Diagnosis not present

## 2022-08-18 DIAGNOSIS — I10 Essential (primary) hypertension: Secondary | ICD-10-CM | POA: Diagnosis not present

## 2022-08-18 LAB — RAD ONC ARIA SESSION SUMMARY
Course Elapsed Days: 12
Plan Fractions Treated to Date: 5
Plan Fractions Treated to Date: 5
Plan Prescribed Dose Per Fraction: 12 Gy
Plan Prescribed Dose Per Fraction: 8 Gy
Plan Total Fractions Prescribed: 5
Plan Total Fractions Prescribed: 5
Plan Total Prescribed Dose: 40 Gy
Plan Total Prescribed Dose: 60 Gy
Reference Point Dosage Given to Date: 40 Gy
Reference Point Dosage Given to Date: 60 Gy
Reference Point Session Dosage Given: 12 Gy
Reference Point Session Dosage Given: 8 Gy
Session Number: 5

## 2022-08-18 MED ORDER — SONAFINE EX EMUL
1.0000 | Freq: Two times a day (BID) | CUTANEOUS | Status: DC
  Administered 2022-08-18: 1 via TOPICAL

## 2022-08-21 ENCOUNTER — Other Ambulatory Visit: Payer: Self-pay

## 2022-08-26 ENCOUNTER — Other Ambulatory Visit: Payer: Self-pay

## 2022-08-26 ENCOUNTER — Ambulatory Visit (HOSPITAL_COMMUNITY)
Admission: RE | Admit: 2022-08-26 | Discharge: 2022-08-26 | Disposition: A | Discharge status: Home / Self Care | Payer: Medicare Other | Source: Ambulatory Visit | Attending: Hematology | Admitting: Hematology

## 2022-08-26 DIAGNOSIS — C3490 Malignant neoplasm of unspecified part of unspecified bronchus or lung: Secondary | ICD-10-CM | POA: Diagnosis not present

## 2022-08-26 DIAGNOSIS — D499 Neoplasm of unspecified behavior of unspecified site: Secondary | ICD-10-CM | POA: Insufficient documentation

## 2022-08-26 MED ORDER — IOHEXOL 300 MG/ML  SOLN
75.0000 mL | Freq: Once | INTRAMUSCULAR | Status: AC | PRN
  Administered 2022-08-26: 75 mL via INTRAVENOUS

## 2022-09-02 ENCOUNTER — Inpatient Hospital Stay: Payer: Medicare Other

## 2022-09-02 ENCOUNTER — Inpatient Hospital Stay (HOSPITAL_BASED_OUTPATIENT_CLINIC_OR_DEPARTMENT_OTHER): Payer: Medicare Other | Admitting: Hematology

## 2022-09-02 VITALS — BP 106/49 | HR 70 | Temp 97.3°F | Resp 18

## 2022-09-02 DIAGNOSIS — C3412 Malignant neoplasm of upper lobe, left bronchus or lung: Secondary | ICD-10-CM

## 2022-09-02 DIAGNOSIS — Z95828 Presence of other vascular implants and grafts: Secondary | ICD-10-CM | POA: Diagnosis not present

## 2022-09-02 DIAGNOSIS — Z5112 Encounter for antineoplastic immunotherapy: Secondary | ICD-10-CM | POA: Diagnosis not present

## 2022-09-02 DIAGNOSIS — C7951 Secondary malignant neoplasm of bone: Secondary | ICD-10-CM | POA: Diagnosis not present

## 2022-09-02 DIAGNOSIS — Z87891 Personal history of nicotine dependence: Secondary | ICD-10-CM | POA: Diagnosis not present

## 2022-09-02 DIAGNOSIS — Z79899 Other long term (current) drug therapy: Secondary | ICD-10-CM | POA: Diagnosis not present

## 2022-09-02 LAB — CBC WITH DIFFERENTIAL/PLATELET
Abs Immature Granulocytes: 0.04 10*3/uL (ref 0.00–0.07)
Basophils Absolute: 0.1 10*3/uL (ref 0.0–0.1)
Basophils Relative: 1 %
Eosinophils Absolute: 0.3 10*3/uL (ref 0.0–0.5)
Eosinophils Relative: 4 %
HCT: 34.9 % — ABNORMAL LOW (ref 39.0–52.0)
Hemoglobin: 11.8 g/dL — ABNORMAL LOW (ref 13.0–17.0)
Immature Granulocytes: 1 %
Lymphocytes Relative: 11 %
Lymphs Abs: 0.7 10*3/uL (ref 0.7–4.0)
MCH: 33.4 pg (ref 26.0–34.0)
MCHC: 33.8 g/dL (ref 30.0–36.0)
MCV: 98.9 fL (ref 80.0–100.0)
Monocytes Absolute: 0.5 10*3/uL (ref 0.1–1.0)
Monocytes Relative: 7 %
Neutro Abs: 4.8 10*3/uL (ref 1.7–7.7)
Neutrophils Relative %: 76 %
Platelets: 171 10*3/uL (ref 150–400)
RBC: 3.53 MIL/uL — ABNORMAL LOW (ref 4.22–5.81)
RDW: 12.8 % (ref 11.5–15.5)
WBC: 6.3 10*3/uL (ref 4.0–10.5)
nRBC: 0 % (ref 0.0–0.2)

## 2022-09-02 LAB — COMPREHENSIVE METABOLIC PANEL
ALT: 35 U/L (ref 0–44)
AST: 22 U/L (ref 15–41)
Albumin: 3.4 g/dL — ABNORMAL LOW (ref 3.5–5.0)
Alkaline Phosphatase: 102 U/L (ref 38–126)
Anion gap: 7 (ref 5–15)
BUN: 19 mg/dL (ref 8–23)
CO2: 25 mmol/L (ref 22–32)
Calcium: 8.2 mg/dL — ABNORMAL LOW (ref 8.9–10.3)
Chloride: 103 mmol/L (ref 98–111)
Creatinine, Ser: 0.85 mg/dL (ref 0.61–1.24)
GFR, Estimated: >60 mL/min (ref >60)
Glucose, Bld: 109 mg/dL — ABNORMAL HIGH (ref 70–99)
Potassium: 3.1 mmol/L — ABNORMAL LOW (ref 3.5–5.1)
Sodium: 135 mmol/L (ref 135–145)
Total Bilirubin: 0.4 mg/dL (ref 0.3–1.2)
Total Protein: 6.3 g/dL — ABNORMAL LOW (ref 6.5–8.1)

## 2022-09-02 LAB — TSH: TSH: 3.138 u[IU]/mL (ref 0.350–4.500)

## 2022-09-02 LAB — MAGNESIUM: Magnesium: 1.8 mg/dL (ref 1.7–2.4)

## 2022-09-02 MED ORDER — SODIUM CHLORIDE 0.9 % IV SOLN
200.0000 mg | Freq: Once | INTRAVENOUS | Status: AC
  Administered 2022-09-02: 200 mg via INTRAVENOUS
  Filled 2022-09-02: qty 8

## 2022-09-02 MED ORDER — HEPARIN SOD (PORK) LOCK FLUSH 100 UNIT/ML IV SOLN
500.0000 [IU] | Freq: Once | INTRAVENOUS | Status: AC | PRN
  Administered 2022-09-02: 500 [IU]

## 2022-09-02 MED ORDER — SODIUM CHLORIDE 0.9% FLUSH
10.0000 mL | INTRAVENOUS | Status: DC | PRN
  Administered 2022-09-02: 10 mL

## 2022-09-02 MED ORDER — SODIUM CHLORIDE 0.9% FLUSH
10.0000 mL | Freq: Once | INTRAVENOUS | Status: AC
  Administered 2022-09-02: 10 mL via INTRAVENOUS

## 2022-09-02 MED ORDER — SODIUM CHLORIDE 0.9 % IV SOLN: Freq: Once | INTRAVENOUS | Status: AC

## 2022-09-02 NOTE — Patient Instructions (Addendum)
[facility]  Discharge Instructions  You were seen and examined today by Dr. Delton Coombes.  Please proceed with treatment today as scheduled.  For the rash, please put Hydrocortisone cream twice daily on the impacted area.  Follow-up as scheduled.  Thank you for choosing [facility] to provide your oncology and hematology care.   To afford each patient quality time with our provider, please arrive at least 15 minutes before your scheduled appointment time. You may need to reschedule your appointment if you arrive late (10 or more minutes). Arriving late affects you and other patients whose appointments are after yours.  Also, if you miss three or more appointments without notifying the office, you may be dismissed from the clinic at the provider's discretion.    Again, thank you for choosing [facility].  Our hope is that these requests will decrease the amount of time that you wait before being seen by our physicians.   If you have a lab appointment with the [facility] please come in thru the Main Entrance and check in at the main information desk.           _____________________________________________________________  Should you have questions after your visit to [facility], please contact our office at [phone] and follow the prompts.  Our office hours are 8:00 a.m. to 4:30 p.m. Monday - Thursday and 8:00 a.m. to 2:30 p.m. Friday.  Please note that voicemails left after 4:00 p.m. may not be returned until the following business day.  We are closed weekends and all major holidays.  You do have access to a nurse 24-7, just call the main number to the clinic [phone] and do not press any options, hold on the line and a nurse will answer the phone.    For prescription refill requests, have your pharmacy contact our office and allow 72 hours.    Masks are optional in the cancer centers. If you would  like for your care team to wear a mask while they are taking care of you, please let them know. You may have one support person who is at least 75 years old accompany you for your appointments.

## 2022-09-02 NOTE — Progress Notes (Signed)
Patient has been assessed, vital signs and labs have been reviewed by Dr. Katragadda. ANC, Creatinine, LFTs, and Platelets are within treatment parameters per Dr. Katragadda. The patient is good to proceed with treatment at this time. Primary RN and pharmacy aware.  

## 2022-09-02 NOTE — Progress Notes (Signed)
[place], [place] [address]   CLINIC:  Medical Oncology/Hematology  PCP:  [facility], [place] / [place] [address] [phone]   REASON FOR VISIT:  Follow-up for history of bilateral lung cancers, normocytic anemia, and B12 deficiency  PRIOR THERAPY: - Right upper lobectomy (February 2012) for right lung cancer - SBRT (November 2015) for stage I left adenocarcinoma  NGS Results: Not done  CURRENT THERAPY: Surveillance  BRIEF ONCOLOGIC HISTORY:  Oncology History  Adenocarcinoma of lung ([facility])  09/30/2010 Cancer Staging   CT of chest showed spiculated lesion   10/15/2010 Surgery   Right upper lobectomy by Dr. Arlyce Dice   10/16/2010 Remission     10/24/2013 Imaging   CT Chest-  Enlarging subpleural nodule in the left lower lobe, now 9 mm, with distortion of the overlying pleura. Finding is concerning for a small bronchogenic carcinoma   01/23/2014 Imaging   CT Chest- Left lower lobe nodule has enlarged from 11/01/2012 and is worrisome for primary bronchogenic carcinoma.    07/04/2014 - 07/10/2014 Radiation Therapy   SBRT by Dr. Isidore Moos in 3 fractions.   07/05/2015 Imaging   perifissural nodule in LLL appears slightly smaller with parenchymal opacity attrib to XRT, likely inflamm pathcy airspace dz more inferiorly in LLL, airspace dz in RLL resolved. no metastatic disease   Primary cancer of left lower lobe of lung ([facility])  06/18/2014 Initial Diagnosis   Left lower lobe nodule suspicious for primary lung carcinoma   07/04/2014 - 07/10/2014 Radiation Therapy   SBRT   10/12/2014 Imaging   No acute findings within the chest. Decrease in size of left lower lobe perifissural nodule compatible with response to therapy. No new or progressive disease identified within the chest.   Adenocarcinoma of upper lobe of left lung ([facility])  07/16/2022 Initial Diagnosis   Adenocarcinoma of upper lobe of left lung ([facility])   07/21/2022 -   Chemotherapy   Patient is on Treatment Plan : LUNG NSCLC Pembrolizumab (200) q21d       CANCER STAGING:  Cancer Staging  Adenocarcinoma of lung ([facility]) Staging form: Lung, AJCC 7th Edition - Clinical: Stage IA (T1a, N0, M0) - Signed by Baird Cancer, PA on 05/06/2011  Adenocarcinoma of upper lobe of left lung ([facility]) Staging form: Lung, AJCC 8th Edition - Clinical stage from 07/16/2022: Stage IVA (cT1b, cN0, pM1b) - Unsigned   INTERVAL HISTORY:  Maurice Orozco, a 76 y.o. male, seen for follow-up of metastatic lung cancer and toxicity assessment prior to next cycle of immunotherapy.  Reported chronic pain in the back as 6 out of 10.  Also reported nosebleeds x 2 since last treatment 3 weeks ago.  He thinks the nosebleeds are triggered by anxiety.  Reported skin rash over the left scapula last night which was itching.  REVIEW OF SYSTEMS:    Review of Systems  HENT:   Positive for nosebleeds.   Respiratory:  Positive for cough and shortness of breath (COPD). Negative for hemoptysis.   Musculoskeletal:  Positive for back pain.  Neurological:  Negative for dizziness, headaches and light-headedness.  Hematological:  Does not bruise/bleed easily.    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 dense RCA calcification --Non-ischemic Myoview   History of kidney stones    in the 70's   History of radiation therapy 07/04/14, 07/06/14, 07/10/14   LLL lung nodule/54 Gy/3 fx- SBRT    Hypercholesterolemia 05/06/2011   Pneumonia due to Streptococcus    HX. POST OPERATIVELY   Port-A-Cath in place 07/21/2022   Pre-diabetes    Stress fx pelvis    DUE TO AA IN 1970   Past Surgical History:  Procedure Laterality Date   BACK SURGERY  07/04/2017   fusion above previous surgery   BACK SURGERY  2011   metal plate l-4,L5   BRONCHIAL BIOPSY  06/03/2022   Procedure: BRONCHIAL BIOPSIES;  Surgeon: Garner Nash, DO;  Location: [facility];  Service: Pulmonary;;   BRONCHIAL BRUSHINGS  06/03/2022   Procedure: BRONCHIAL BRUSHINGS;  Surgeon: Garner Nash, DO;  Location: [facility];  Service: Pulmonary;;   BRONCHIAL NEEDLE ASPIRATION BIOPSY  06/03/2022   Procedure: BRONCHIAL NEEDLE ASPIRATION BIOPSIES;  Surgeon: Garner Nash, DO;  Location: [facility];  Service: Pulmonary;;   Coroanry Calcium Score  03/2017   1043. Noted especially in RCA. Dense calcification, recommend Myoview over Cor CTA.   FIDUCIAL MARKER PLACEMENT  06/03/2022   Procedure: FIDUCIAL MARKER PLACEMENT;  Surgeon: Garner Nash, DO;  Location: [facility];  Service: Pulmonary;;   IR IMAGING GUIDED PORT INSERTION  08/04/2022   LIPOMA EXCISION N/A 10/14/2017   Procedure: EXCISION LIPOMA ON BACK;  Surgeon: Virl Cagey, MD;  Location: [facility];  Service: General;  Laterality: N/A;   LIPOMA EXCISION Left 11/19/2018   Procedure: MINOR EXCISION OF SEBACEOUS CYST NECK;  Surgeon: Virl Cagey, MD;  Location: [facility];  Service: General;  Laterality: Left;  pt knows to arrive at 7:00   LUNG LOBECTOMY  2012   RT [place]  04/2017   EF 60%. Hypertensive response to exercise. (6:21 min, 7.7 METs) No EKG changes. LOW RISK    SOCIAL HISTORY:  Social History   Socioeconomic History   Marital status: Married    Spouse name: Not on file   Number of children: 3   Years of education: Not on file   Highest education level: Not on file  Occupational History   Not on file  Tobacco Use    Smoking status: Former    Packs/day: 1.50    Years: 59.00    Total pack years: 88.50    Types: Cigarettes    Quit date: 09/08/2010    Years since quitting: 11.9   Smokeless tobacco: Current    Types: Chew   Tobacco comments:    still chews tobacco, he tells me he hasn't in the past 2 days.   Vaping Use   Vaping Use: Never used  Substance and Sexual Activity   Alcohol use: No   Drug use: No   Sexual activity: Not Currently  Other Topics Concern   Not on file  Social History Narrative   Not on file   Social Determinants of Health   Financial Resource Strain: Not on file  Food Insecurity: Not on file  Transportation Needs: No Transportation  Needs (07/13/2018)   PRAPARE - Hydrologist (Medical): No    Lack of Transportation (Non-Medical): No  Physical Activity: Not on file  Stress: Not on file  Social Connections: Not on file  Intimate Partner Violence: Not At Risk (07/13/2018)   Humiliation, Afraid, Rape, and Kick questionnaire    Fear of Current or Ex-Partner: No    Emotionally Abused: No    Physically Abused: No    Sexually Abused: No    FAMILY HISTORY:  Family History  Problem Relation Age of Onset   Hypertension Mother    Kidney disease Father    Heart attack Brother    Cancer Maternal Uncle        prostate cancer   Cancer Maternal Uncle        prostate cancer   Cancer Maternal Uncle        prostate cancer   Cancer Maternal Uncle        prostate cancer    CURRENT MEDICATIONS:  Current Outpatient Medications  Medication Sig Dispense Refill   albuterol (PROVENTIL HFA;VENTOLIN HFA) 108 (90 Base) MCG/ACT inhaler Inhale 2 puffs into the lungs every 4 (four) hours as needed for wheezing or shortness of breath. 2 Inhaler 3   albuterol (PROVENTIL) (2.5 MG/3ML) 0.083% nebulizer solution Take 2.5 mg by nebulization every 6 (six) hours as needed for wheezing or shortness of breath.     cholecalciferol (VITAMIN D3) 25 MCG (1000 UNIT) tablet  Take 1,000 Units by mouth daily.     cimetidine (TAGAMET) 200 MG tablet Take 400-600 mg by mouth daily as needed (for heartburn).     ferrous sulfate 325 (65 FE) MG tablet Take 325 mg by mouth daily with breakfast.     fluticasone (FLONASE) 50 MCG/ACT nasal spray Place 2 sprays into both nostrils daily as needed for rhinitis.     guaiFENesin (MUCINEX) 600 MG 12 hr tablet Take 600 mg by mouth daily.     hydrochlorothiazide (HYDRODIURIL) 25 MG tablet Take 12.5 mg by mouth daily.     HYDROcodone-acetaminophen (NORCO) 10-325 MG tablet Take 1-2 tablets by mouth every 4 (four) hours as needed for moderate pain. 240 tablet 0   Menthol, Topical Analgesic, (ICY HOT EX) Apply 1 Application topically daily as needed (pain).     naproxen sodium (ALEVE) 220 MG tablet Take 220 mg by mouth daily as needed (pain).     Omega-3 Fatty Acids (FISH OIL) 1000 MG CAPS Take 1,000 mg by mouth daily.     Oxycodone HCl 10 MG TABS Take 10 mg by mouth 2 (two) times daily as needed (pain).     pantoprazole (PROTONIX) 40 MG tablet Take 40 mg by mouth See admin instructions. Take 40 mg daily, may take a second 40 mg dose as needed for heartburn     PEMBROLIZUMAB IV Inject into the vein every 21 ( twenty-one) days.     polyethylene glycol powder (GLYCOLAX/MIRALAX) 17 GM/SCOOP powder Take 17 g by mouth daily as needed for mild constipation or moderate constipation.     rosuvastatin (CRESTOR) 40 MG tablet Take 40 mg by mouth at bedtime.     triamcinolone cream (KENALOG) 0.1 % Apply 1 Application topically daily as needed (tick bite/itching).     triazolam (HALCION) 0.25 MG tablet Take 0.25 mg by mouth at bedtime.     vitamin B-12 (CYANOCOBALAMIN) 1000 MCG tablet Take 1,000 mcg by mouth every other day.     lidocaine-prilocaine (EMLA) cream Apply  a small amount to port a cath site and cover with plastic wrap 1 hour prior to infusion appointments (Patient not taking: Reported on 09/02/2022) 30 g 3   prochlorperazine (COMPAZINE) 10 MG  tablet Take 1 tablet (10 mg total) by mouth every 6 (six) hours as needed for nausea or vomiting. (Patient not taking: Reported on 09/02/2022) 30 tablet 1   No current facility-administered medications for this visit.    ALLERGIES:  Allergies  Allergen Reactions   Fentanyl Other (See Comments)    CAUSED HALLUCINATIONS/05-03-13 pt reports it was oral fentanyl that caused hallucinations    Gabapentin Other (See Comments)    Bones throbbed, headaches, weakness   Nabumetone     Unknown reaction    Aspirin Nausea Only    PHYSICAL EXAM:    Performance status (ECOG): 1 - Symptomatic but completely ambulatory  There were no vitals filed for this visit.  Wt Readings from Last 3 Encounters:  09/02/22 139 lb 15.9 oz (63.5 kg)  08/11/22 139 lb 14.4 oz (63.5 kg)  08/04/22 140 lb (63.5 kg)   Physical Exam Constitutional:      Appearance: Normal appearance.  HENT:     Head: Normocephalic and atraumatic.     Mouth/Throat:     Mouth: Mucous membranes are moist.  Eyes:     Extraocular Movements: Extraocular movements intact.     Pupils: Pupils are equal, round, and reactive to light.  Cardiovascular:     Rate and Rhythm: Normal rate and regular rhythm.     Pulses: Normal pulses.     Heart sounds: Normal heart sounds.  Pulmonary:     Effort: Pulmonary effort is normal.     Breath sounds: Decreased breath sounds present.     Comments: Coarse breath sounds and decreased air movement in all lung fields. Abdominal:     General: Bowel sounds are normal.     Palpations: Abdomen is soft.     Tenderness: There is no abdominal tenderness.  Musculoskeletal:        General: No swelling.     Right lower leg: No edema.     Left lower leg: No edema.  Lymphadenopathy:     Cervical: No cervical adenopathy.  Skin:    General: Skin is warm and dry.  Neurological:     General: No focal deficit present.     Mental Status: He is alert and oriented to person, place, and time.  Psychiatric:         Mood and Affect: Mood normal.        Behavior: Behavior normal.      LABORATORY DATA:  I have reviewed the labs as listed.     Latest Ref Rng & Units 09/02/2022   10:53 AM 08/11/2022   10:00 AM 07/21/2022   10:10 AM  CBC  WBC 4.0 - 10.5 K/uL 6.3  9.9  7.9   Hemoglobin 13.0 - 17.0 g/dL 11.8  12.8  13.0   Hematocrit 39.0 - 52.0 % 34.9  39.1  38.7   Platelets 150 - 400 K/uL 171  244  231       Latest Ref Rng & Units 09/02/2022   10:53 AM 08/11/2022   10:00 AM 07/21/2022   10:10 AM  CMP  Glucose 70 - 99 mg/dL 109  125  118   BUN 8 - 23 mg/dL _0 Creatinine 0.61 - 1.24 mg/dL 0.85  0.86  0.77   Sodium 135 -  145 mmol/L 135  136  137   Potassium 3.5 - 5.1 mmol/L 3.1  4.0  3.6   Chloride 98 - 111 mmol/L 103  99  105   CO2 22 - 32 mmol/L _0 Calcium 8.9 - 10.3 mg/dL 8.2  9.1  8.7   Total Protein 6.5 - 8.1 g/dL 6.3  7.0  6.8   Total Bilirubin 0.3 - 1.2 mg/dL 0.4  0.5  0.6   Alkaline Phos 38 - 126 U/L 102  62  57   AST 15 - 41 U/L _1 ALT 0 - 44 U/L 35  17  17     DIAGNOSTIC IMAGING:  I have independently reviewed the scans and discussed with the patient. CT MAXILLOFACIAL W CONTRAST  Result Date: 08/27/2022 CLINICAL DATA:  76 year old male with metastatic lung cancer. Abnormal mass of the left mandible/infratemporal fossa on MRI last month. Subsequent encounter. EXAM: CT MAXILLOFACIAL WITH CONTRAST TECHNIQUE: Multidetector CT imaging of the maxillofacial structures was performed with intravenous contrast. Multiplanar CT image reconstructions were also generated. RADIATION DOSE REDUCTION: This exam was performed according to the departmental dose-optimization program which includes automated exposure control, adjustment of the mA and/or kV according to patient size and/or use of iterative reconstruction technique. CONTRAST:  76m OMNIPAQUE IOHEXOL 300 MG/ML  SOLN COMPARISON:  Brain MRI 07/24/2022 and prior PET-CT 04/24/2022 through 08/23/2008. FINDINGS:  Osseous: There is chronic asymmetric bony lucency and expansion of the mandible ramus greater on the left side, and this corresponds to the recent MRI finding. See coronal series 6, image 47 today. This bony appearance been present on prior PET-CT since 2009, and the larger left-side lesion appears only mildly enlarged over that time. These have never been hypermetabolic on PET. Superimposed motion artifact of the mandible including at the bilateral ramus where impacted bilateral mandible wisdom teeth remain, and the tooth crowns are in continuity with the lucent bone lesions. Absent dentition otherwise. No acute osseous abnormality identified. Orbits: Intact orbital walls. Orbits soft tissues appears symmetric and negative. Sinuses: Paranasal Visualized paranasal sinuses and mastoids are stable and well aerated. Soft tissues: Pharynx motion artifact. Negative visible larynx, parapharyngeal spaces, retropharyngeal space, sublingual space, submandibular spaces and parotid spaces. Bilateral masticator soft tissues also appear to remain normal in and around the chronically expanded mandible rami. Visible upper cervical lymph nodes appear negative. Visible major vascular structures in the neck and at the skull base are patent with calcified carotid atherosclerosis. Limited intracranial: Stable. IMPRESSION: Recent MRI finding corresponds to a abnormal ramus of the left mandible. But this is chronic and has not significantly changed over multiple prior PET-CTs since 2009. 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 Electronically Signed   By: HGenevie AnnM.D.   On: 08/27/2022 11:06   IR IMAGING GUIDED PORT INSERTION  Result Date: 08/04/2022 INDICATION: LEFT lung cancer. EXAM: IMPLANTED PORT A CATH PLACEMENT WITH ULTRASOUND AND FLUOROSCOPIC GUIDANCE MEDICATIONS:  None ANESTHESIA/SEDATION: Moderate (conscious) sedation was employed during this procedure. A total of Versed 2 mg and Dilaudid 1 mg was administered intravenously. Moderate Sedation Time: 26 minutes. The patient's level of consciousness and vital signs were monitored continuously by radiology nursing throughout the procedure under my direct supervision. FLUOROSCOPY TIME:  Fluoroscopic dose; 1.2 mGy COMPLICATIONS: None immediate. PROCEDURE: The procedure,  risks, benefits, and alternatives were explained to the patient. Questions regarding the procedure were encouraged and answered. The patient understands and consents to the procedure. The RIGHT neck and chest were prepped with chlorhexidine in a sterile fashion, and a sterile drape was applied covering the operative field. Maximum barrier sterile technique with sterile gowns and gloves were used for the procedure. A timeout was performed prior to the initiation of the procedure. Local anesthesia was provided with 1% lidocaine with epinephrine. After creating a small venotomy incision, a micropuncture kit was utilized to access the internal jugular vein under direct, real-time ultrasound guidance. Ultrasound image documentation was performed. The microwire was kinked to measure appropriate catheter length. A subcutaneous port pocket was then created along the upper chest wall utilizing a combination of sharp and blunt dissection. The pocket was irrigated with sterile saline. A single lumen ISP power injectable port was chosen for placement. The 8 Fr catheter was tunneled from the port pocket site to the venotomy incision. The port was placed in the pocket. The external catheter was trimmed to appropriate length. At the venotomy, an 8 Fr peel-away sheath was placed over a guidewire under fluoroscopic guidance. The catheter was then placed through the sheath and the sheath was removed. Final catheter positioning was confirmed and documented with a fluoroscopic spot  radiograph. The port was accessed with a Huber needle, aspirated and flushed with heparinized saline. The port pocket incision was closed with interrupted 3-0 Vicryl suture then Dermabond was applied, including at the venotomy incision. Dressings were placed. The patient tolerated the procedure well without immediate post procedural complication. IMPRESSION: Successful placement of a RIGHT internal jugular approach power injectable Port-A-Cath. The tip of the catheter is positioned within the proximal RIGHT atrium. The catheter is ready for immediate use. Michaelle Birks, MD [facility] Electronically Signed   By: Michaelle Birks M.D.   On: 08/04/2022 18:37     ASSESSMENT:  1.  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 - Status post right upper lobectomy in February 2012 for right lung cancer. - LDCT chest (04/14/2022): Enlarging spiculated nodular soft tissue thickening in the posterior left upper lobe measuring 11.4 mm. - PET scan (04/24/2022): Left upper lobe lung nodule 14 mm with SUV 3.62.  Hypermetabolic focus in the inferior aspect of the right scapula without discrete CT correlate with SUV 5.71.  No hypermetabolic adenopathy. - LUL lesion FNA (06/03/2022): Adenocarcinoma - Right scapula biopsy (07/03/2022) adenocarcinoma consistent with lung primary - NGS: PD-L1 22 C3 TPS 100%, T p53 pathogenic variant, MSI-stable, TMB-low, no other targetable mutations - Cycle 1 of pembrolizumab started on 07/21/2022  2.  Cystic lesion of left kidney - CTA PE on 04/14/2022 showed complex cystic lesion of the left kidney.  PET scan also showed exophytic 18 mm left renal lesion suspicious for malignancy. - MRI abdomen on 05/16/2022 showed benign appearing Bosniak category 1 and 2 left renal cyst with no evidence of neoplasm.  No further work-up needed.  PLAN:   1.  Metastatic adenocarcinoma  of the left upper lobe of the lung to the scapula: - He has tolerated cycle 2 of Keytruda reasonably well. - Reviewed CT maxillofacial scan which showed a mass in the left mandible is likely dentigerous cyst, benign and stable. - Reviewed labs today which showed normal LFTs and CBC.  TSH is 3.1. - Erythematous rash on the left scapula, likely from  radiation.  He will apply hydrocodone cream twice daily. - Proceed with Keytruda today.  RTC 3 weeks for follow-up.  Will plan on checking ferritin and iron panel and B12 level at next visit.   2.  Right scapular pain: - XRT to the right scapular lesion and left upper lobe lung lesion 5 fractions completed on 08/18/2022.  Reports resolution of pain.   3.  B12 deficiency - Continue B12 every other day and iron tablet twice daily.  4.  Chronic back pain: -He has chronic back pain of 20+ years duration. - Continue narcotics per Dr. Nolon Rod office.     All questions were answered. The patient knows to call the clinic with any problems, questions or concerns.    Maurice Jack, MD

## 2022-09-02 NOTE — Patient Instructions (Signed)
[facility]  Discharge Instructions: Thank you for choosing [facility] to provide your oncology and hematology care.  If you have a lab appointment with the [facility], please come in thru the Main Entrance and check in at the main information desk.  Wear comfortable clothing and clothing appropriate for easy access to any Portacath or PICC line.   We strive to give you quality time with your provider. You may need to reschedule your appointment if you arrive late (15 or more minutes).  Arriving late affects you and other patients whose appointments are after yours.  Also, if you miss three or more appointments without notifying the office, you may be dismissed from the clinic at the provider's discretion.      For prescription refill requests, have your pharmacy contact our office and allow 72 hours for refills to be completed.    Today you received the following chemotherapy and/or immunotherapy agents Keytruda      To help prevent nausea and vomiting after your treatment, we encourage you to take your nausea medication as directed.  BELOW ARE SYMPTOMS THAT SHOULD BE REPORTED IMMEDIATELY: *FEVER GREATER THAN 100.4 F (38 C) OR HIGHER *CHILLS OR SWEATING *NAUSEA AND VOMITING THAT IS NOT CONTROLLED WITH YOUR NAUSEA MEDICATION *UNUSUAL SHORTNESS OF BREATH *UNUSUAL BRUISING OR BLEEDING *URINARY PROBLEMS (pain or burning when urinating, or frequent urination) *BOWEL PROBLEMS (unusual diarrhea, constipation, pain near the anus) TENDERNESS IN MOUTH AND THROAT WITH OR WITHOUT PRESENCE OF ULCERS (sore throat, sores in mouth, or a toothache) UNUSUAL RASH, SWELLING OR PAIN  UNUSUAL VAGINAL DISCHARGE OR ITCHING   Items with * indicate a potential emergency and should be followed up as soon as possible or go to the Emergency Department if any problems should occur.  Please show the CHEMOTHERAPY ALERT CARD or IMMUNOTHERAPY ALERT CARD at check-in to the  Emergency Department and triage nurse.  Should you have questions after your visit or need to cancel or reschedule your appointment, please contact [facility] [phone]  and follow the prompts.  Office hours are 8:00 a.m. to 4:30 p.m. Monday - Friday. Please note that voicemails left after 4:00 p.m. may not be returned until the following business day.  We are closed weekends and major holidays. You have access to a nurse at all times for urgent questions. Please call the main number to the clinic [phone] and follow the prompts.  For any non-urgent questions, you may also contact your provider using MyChart. We now offer e-Visits for anyone 67 and older to request care online for non-urgent symptoms. For details visit [email or web address].   Also download the MyChart app! Go to the app store, search "MyChart", open the app, select , and log in with your MyChart username and password.  Masks are optional in the cancer centers. If you would like for your care team to wear a mask while they are taking care of you, please let them know. You may have one support person who is at least 75 years old accompany you for your appointments.

## 2022-09-02 NOTE — Progress Notes (Signed)
Patient presents today for [facility] per providers order.  Vital signs and labs reviewed by MD.  Message received from Adonis Huguenin RN/Dr. Delton Coombes patient okay for treatment.  Treatment given today per MD orders.  Stable during infusion without adverse affects.  Vital signs stable.  No complaints at this time.  Discharge from clinic ambulatory in stable condition.  Alert and oriented X 3.  Follow up with [facility] as scheduled.

## 2022-09-05 ENCOUNTER — Other Ambulatory Visit: Payer: Self-pay

## 2022-09-05 DIAGNOSIS — C3412 Malignant neoplasm of upper lobe, left bronchus or lung: Secondary | ICD-10-CM

## 2022-09-05 DIAGNOSIS — Z95828 Presence of other vascular implants and grafts: Secondary | ICD-10-CM

## 2022-09-06 ENCOUNTER — Other Ambulatory Visit: Payer: Self-pay

## 2022-09-12 NOTE — Radiation Completion Notes (Signed)
Patient Name: Maurice Orozco, Maurice Orozco MRN: [id number] Date of Birth: [birth year] Referring Physician: Derek Jack, M.D. Date of Service: 2022-09-12 Radiation Oncologist: Eppie Gibson, M.D. 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) T=cT1b, N=cN0, M=pM1b Intent: Curative     ==========DELIVERED PLANS==========  First Treatment Date: 2022-08-06 - Last Treatment Date: 2022-08-18   Plan Name: Lung_L_upper Site: Lung, Left Technique: SBRT/SRT-IMRT Mode: Photon Dose Per Fraction: 12 Gy Prescribed Dose (Delivered / Prescribed): 60 Gy / 60 Gy Prescribed Fxs (Delivered / Prescribed): 5 / 5   Plan Name: Chest_R_Scap Site: Scapula, Right Technique: SBRT/SRT-IMRT Mode: Photon Dose Per Fraction: 8 Gy Prescribed Dose (Delivered / Prescribed): 40 Gy / 40 Gy Prescribed Fxs (Delivered / Prescribed): 5 / 5     ==========ON TREATMENT VISIT DATES========== 2022-08-06, 2022-08-06, 2022-08-08, 2022-08-08, 2022-08-13, 2022-08-13, 2022-08-15, 2022-08-15, 2022-08-18, 2022-08-18, 2022-08-18     ==========UPCOMING VISITS==========       ==========APPENDIX - ON TREATMENT VISIT NOTES==========   PatEd 2022-08-18 Ongoing education performed.   ImpPlan 2022-08-18 The patient is tolerating radiation. Continue treatment as planned.   PhysExam 2022-08-18 Alert, no acute distress.   ProgNote 2022-08-18 Changes from last week/visit? [ No ] Pain? [ Yes, chronic back pain ] Fatigue? [ No ] Skin irritation? [ Yes, minor rash ] Shortness of breath? Cough? [ yes, baseline, no more than usual ] Pain or diff. swallowing? [ No ] Getting chemo/immunotherapy? Last tx: [ last monday  was last treatment ] Need refills: [ No ] Additional  Weekly Progress Notes [ does have rash on abdomen as well ]    RunningNotes 2022-08-18 08-18-22 education performed

## 2022-09-16 NOTE — Progress Notes (Signed)
Mr. Siegert presents to clinic today following completion of radiation therapy for lung cancer. He completed treatment on 08-18-22,   PAIN: Chronic back pain  RESPIRATORY: Wheezing and Shortness of Breath, no more than usual baseline  Walking. Pt is on room air. Noted :Rash., have seen md for that and got medication  SWALLOWING/DIET: Pt is eating well with no needs. No swallowing concerns.  The patient eats a regular, healthy diet..  OTHER: Pt complains of fatigue and weakness have remained. Pt wants to make sure things are doing well.   Vitals:   09/19/22 1357  BP: (!) 151/71  Pulse: (!) 105  Resp: 20  Temp: 97.8 F (36.6 C)  SpO2: 98%     Wt Readings from Last 3 Encounters:  09/19/22 137 lb 12.8 oz (62.5 kg)  09/02/22 139 lb 15.9 oz (63.5 kg)  08/11/22 139 lb 14.4 oz (63.5 kg)    Wt Readings from Last 3 Encounters:  09/02/22 139 lb 15.9 oz (63.5 kg)  08/11/22 139 lb 14.4 oz (63.5 kg)  08/04/22 140 lb (63.5 kg)

## 2022-09-19 ENCOUNTER — Ambulatory Visit
Admission: RE | Admit: 2022-09-19 | Discharge: 2022-09-19 | Disposition: A | Discharge status: Home / Self Care | Payer: Medicare Other | Source: Ambulatory Visit | Attending: Radiation Oncology | Admitting: Radiation Oncology

## 2022-09-19 ENCOUNTER — Encounter: Payer: Self-pay | Admitting: Radiation Oncology

## 2022-09-19 VITALS — BP 151/71 | HR 105 | Temp 97.8°F | Resp 20 | Ht 66.0 in | Wt 137.8 lb

## 2022-09-19 DIAGNOSIS — Z87442 Personal history of urinary calculi: Secondary | ICD-10-CM | POA: Diagnosis not present

## 2022-09-19 DIAGNOSIS — C3412 Malignant neoplasm of upper lobe, left bronchus or lung: Secondary | ICD-10-CM | POA: Diagnosis not present

## 2022-09-19 DIAGNOSIS — Z6822 Body mass index (BMI) 22.0-22.9, adult: Secondary | ICD-10-CM | POA: Diagnosis not present

## 2022-09-19 DIAGNOSIS — C3432 Malignant neoplasm of lower lobe, left bronchus or lung: Secondary | ICD-10-CM | POA: Diagnosis not present

## 2022-09-19 DIAGNOSIS — G8929 Other chronic pain: Secondary | ICD-10-CM | POA: Insufficient documentation

## 2022-09-19 DIAGNOSIS — Z87891 Personal history of nicotine dependence: Secondary | ICD-10-CM | POA: Diagnosis not present

## 2022-09-19 DIAGNOSIS — C3490 Malignant neoplasm of unspecified part of unspecified bronchus or lung: Secondary | ICD-10-CM | POA: Diagnosis not present

## 2022-09-19 DIAGNOSIS — I700 Atherosclerosis of aorta: Secondary | ICD-10-CM | POA: Diagnosis not present

## 2022-09-19 DIAGNOSIS — K219 Gastro-esophageal reflux disease without esophagitis: Secondary | ICD-10-CM | POA: Diagnosis not present

## 2022-09-19 DIAGNOSIS — C7951 Secondary malignant neoplasm of bone: Secondary | ICD-10-CM | POA: Diagnosis not present

## 2022-09-19 DIAGNOSIS — I10 Essential (primary) hypertension: Secondary | ICD-10-CM | POA: Diagnosis not present

## 2022-09-19 DIAGNOSIS — J449 Chronic obstructive pulmonary disease, unspecified: Secondary | ICD-10-CM | POA: Diagnosis not present

## 2022-09-19 DIAGNOSIS — Z79899 Other long term (current) drug therapy: Secondary | ICD-10-CM | POA: Diagnosis not present

## 2022-09-19 DIAGNOSIS — E7800 Pure hypercholesterolemia, unspecified: Secondary | ICD-10-CM | POA: Insufficient documentation

## 2022-09-19 DIAGNOSIS — Z923 Personal history of irradiation: Secondary | ICD-10-CM | POA: Diagnosis not present

## 2022-09-19 DIAGNOSIS — L309 Dermatitis, unspecified: Secondary | ICD-10-CM | POA: Diagnosis not present

## 2022-09-19 DIAGNOSIS — M5136 Other intervertebral disc degeneration, lumbar region: Secondary | ICD-10-CM | POA: Diagnosis not present

## 2022-09-19 DIAGNOSIS — M1991 Primary osteoarthritis, unspecified site: Secondary | ICD-10-CM | POA: Diagnosis not present

## 2022-09-19 DIAGNOSIS — M48062 Spinal stenosis, lumbar region with neurogenic claudication: Secondary | ICD-10-CM | POA: Diagnosis not present

## 2022-09-19 NOTE — Progress Notes (Signed)
Radiation Oncology         [phone] ________________________________  Outpatient Followup  Name: Maurice Orozco MRN: [id number]  Date: 09/19/2022  DOB: [birth year]  CC:Maurice Nevins, MD  Maurice Massed, MD   REFERRING PHYSICIAN: Doreatha Massed, MD  DIAGNOSIS:    ICD-10-CM   1. 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) Staging form: Lung, AJCC 8th Edition - Clinical stage from 07/16/2022: Stage IVA (cT1b, cN0, pM1b) - Unsigned Histopathologic type: Adenocarcinoma, NOS Stage prefix: Initial diagnosis   Diagnosis: C34.12 Malignant neoplasm of upper lobe, left bronchus or lung; C79.51 Secondary malignant neoplasm of bone Staging on 2011-05-06: Adenocarcinoma of lung (HCC) T=T1a, N=N0, M=M0 Staging on 2022-07-16: 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) T=cT1b, N=cN0, M=pM1b Intent: Curative     ==========DELIVERED PLANS==========  First Treatment Date: 2022-08-06 - Last Treatment Date: 2022-08-18   Plan Name: Lung_L_upper Site: Lung, Left Technique: SBRT/SRT-IMRT Mode: Photon Dose Per Fraction: 12 Gy Prescribed Dose (Delivered / Prescribed): 60 Gy / 60 Gy Prescribed Fxs (Delivered / Prescribed): 5 / 5   Plan Name: Chest_R_Scap Site: Scapula, Right Technique: SBRT/SRT-IMRT Mode: Photon Dose Per Fraction: 8 Gy Prescribed Dose (Delivered / Prescribed): 40 Gy / 40 Gy Prescribed Fxs (Delivered / Prescribed): 5 / 5    -------------------------------- Radiation treatment dates:   07/04/2014, 07/06/2014, 07/10/2014 Site/dose:   Left lower lung nodule / 54 Gy in 3 fractions given every other day Beams/energy:   SBRT 3D  //// 6 MV FFF  --------------------------------------  CHIEF COMPLAINT: I'm doing okay  NARRATIVE: Maurice Orozco presents to clinic today following completion of radiation therapy for lung cancer. He completed treatment on 08-18-22,   PAIN: Chronic Low back pain - stable  RESPIRATORY: Wheezing and Shortness of Breath, no more than usual baseline  Walking. Pt is on room air. Noted :Rash., have seen md for that and got medication  SWALLOWING/DIET: Pt is eating well with no needs. No swallowing concerns.  The patient eats a regular, healthy diet..  OTHER: Pt complains of fatigue and weakness have remained. Pt wants to make sure things are doing well.   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    PAST SURGICAL HISTORY: Past Surgical History:  Procedure Laterality Date   BACK SURGERY  07/04/2017   fusion above previous surgery   BACK SURGERY   2011   metal plate O-8,S2   BRONCHIAL BIOPSY  06/03/2022   Procedure: BRONCHIAL BIOPSIES;  Surgeon: Josephine Igo, DO;  Location: [facility];  Service: Pulmonary;;   BRONCHIAL BRUSHINGS  06/03/2022   Procedure: BRONCHIAL BRUSHINGS;  Surgeon: Josephine Igo, DO;  Location: [facility];  Service: Pulmonary;;   BRONCHIAL NEEDLE ASPIRATION BIOPSY  06/03/2022   Procedure: BRONCHIAL NEEDLE ASPIRATION BIOPSIES;  Surgeon: Josephine Igo, DO;  Location: [facility];  Service: Pulmonary;;   Coroanry Calcium Score  03/2017   1043. Noted especially in RCA. Dense calcification, recommend Myoview over Cor CTA.   FIDUCIAL MARKER PLACEMENT  06/03/2022   Procedure: FIDUCIAL MARKER PLACEMENT;  Surgeon: Josephine Igo, DO;  Location: [facility];  Service: Pulmonary;;   IR IMAGING GUIDED PORT INSERTION  08/04/2022   LIPOMA EXCISION N/A 10/14/2017   Procedure: EXCISION LIPOMA ON BACK;  Surgeon: Lucretia Roers, MD;  Location: [facility];  Service: General;  Laterality: N/A;   LIPOMA EXCISION Left 11/19/2018   Procedure: MINOR EXCISION OF SEBACEOUS CYST NECK;  Surgeon: Lucretia Roers, MD;  Location: [facility];  Service: General;  Laterality: Left;  pt knows to arrive at 7:00   LUNG LOBECTOMY  2012   RT UPPER LOBE   NM MYOVIEW LTD  04/2017   EF 60%. Hypertensive response to exercise. (6:21 min, 7.7 METs) No EKG changes. LOW RISK    FAMILY HISTORY: family history includes Cancer in his maternal uncle, maternal uncle, maternal uncle, and maternal uncle; Heart attack in his brother; Hypertension in his mother; Kidney disease in his father.  SOCIAL HISTORY:  reports that he quit smoking about 12 years ago. His smoking use included cigarettes. He has a 88.50 pack-year smoking history. His smokeless tobacco use includes chew. He reports that he does not drink alcohol and does not use drugs.  ALLERGIES: Fentanyl, Gabapentin, Nabumetone, and Aspirin  MEDICATIONS:  Current Outpatient Medications  Medication  Sig Dispense Refill   albuterol (PROVENTIL HFA;VENTOLIN HFA) 108 (90 Base) MCG/ACT inhaler Inhale 2 puffs into the lungs every 4 (four) hours as needed for wheezing or shortness of breath. 2 Inhaler 3   albuterol (PROVENTIL) (2.5 MG/3ML) 0.083% nebulizer solution Take 2.5 mg by nebulization every 6 (six) hours as needed for wheezing or shortness of breath.     cholecalciferol (VITAMIN D3) 25 MCG (1000 UNIT) tablet Take 1,000 Units by mouth daily.     cimetidine (TAGAMET) 200 MG tablet Take 400-600 mg by mouth daily as needed (for heartburn).     ferrous sulfate 325 (65 FE) MG tablet Take 325 mg by mouth daily with breakfast.     fluticasone (FLONASE) 50 MCG/ACT nasal spray Place 2 sprays into both nostrils daily as needed for rhinitis.     guaiFENesin (MUCINEX) 600 MG 12 hr tablet Take 600 mg by mouth daily.     hydrochlorothiazide (HYDRODIURIL) 25 MG tablet Take 12.5 mg by mouth daily.     HYDROcodone-acetaminophen (NORCO) 10-325 MG tablet Take 1-2 tablets by mouth every 4 (four) hours as needed for moderate pain. 240 tablet 0   lidocaine-prilocaine (EMLA) cream Apply a small amount to port a cath site and cover with plastic wrap 1 hour prior to infusion appointments 30 g 3   Menthol, Topical Analgesic, (ICY  HOT EX) Apply 1 Application topically daily as needed (pain).     Omega-3 Fatty Acids (FISH OIL) 1000 MG CAPS Take 1,000 mg by mouth daily.     Oxycodone HCl 10 MG TABS Take 10 mg by mouth 2 (two) times daily as needed (pain).     pantoprazole (PROTONIX) 40 MG tablet Take 40 mg by mouth See admin instructions. Take 40 mg daily, may take a second 40 mg dose as needed for heartburn     PEMBROLIZUMAB IV Inject into the vein every 21 ( twenty-one) days.     polyethylene glycol powder (GLYCOLAX/MIRALAX) 17 GM/SCOOP powder Take 17 g by mouth daily as needed for mild constipation or moderate constipation.     prochlorperazine (COMPAZINE) 10 MG tablet Take 1 tablet (10 mg total) by mouth every 6 (six)  hours as needed for nausea or vomiting. 30 tablet 1   rosuvastatin (CRESTOR) 40 MG tablet Take 40 mg by mouth at bedtime.     triamcinolone cream (KENALOG) 0.1 % Apply 1 Application topically daily as needed (tick bite/itching).     triazolam (HALCION) 0.25 MG tablet Take 0.25 mg by mouth at bedtime.     vitamin B-12 (CYANOCOBALAMIN) 1000 MCG tablet Take 1,000 mcg by mouth every other day.     naproxen sodium (ALEVE) 220 MG tablet Take 220 mg by mouth daily as needed (pain). (Patient not taking: Reported on 09/19/2022)     No current facility-administered medications for this encounter.    REVIEW OF SYSTEMS:  Notable for that above.   PHYSICAL EXAM:  height is 5\' 6"  (1.676 m) and weight is 137 lb 12.8 oz (62.5 kg). His temperature is 97.8 F (36.6 C). His blood pressure is 151/71 (abnormal) and his pulse is 105 (abnormal). His respiration is 20 and oxygen saturation is 98%.   General: Alert and oriented, in no acute distress  HEENT: Extraocular movements are intact.  Head is normocephalic. Heart: Regular in rate and rhythm with no murmurs, rubs, or gallops. Chest: Clear to auscultation bilaterally, with no rhonchi, wheezes, or rales. Skin: Erythematous rash over his torso and abdomen Musculoskeletal: Ambulatory.  Resolution of swelling of the soft tissue over the right scapula.  Surgical scars above and below the area of swelling which I presume are from previous thoracotomy Neurologic: Cranial nerves II through XII are grossly intact. No obvious focalities. Speech is fluent. Coordination is intact. Psychiatric: Judgment and insight are intact. Affect is appropriate.   ECOG = 1  0 - Asymptomatic (Fully active, able to carry on all predisease activities without restriction)  1 - Symptomatic but completely ambulatory (Restricted in physically strenuous activity but ambulatory and able to carry out work of a light or sedentary nature. For example, light housework, office work)  2 -  Symptomatic, <50% in bed during the day (Ambulatory and capable of all self care but unable to carry out any work activities. Up and about more than 50% of waking hours)  3 - Symptomatic, >50% in bed, but not bedbound (Capable of only limited self-care, confined to bed or chair 50% or more of waking hours)  4 - Bedbound (Completely disabled. Cannot carry on any self-care. Totally confined to bed or chair)  5 - Death   Eustace Pen MM, Creech RH, Tormey DC, et al. [phone]). "Toxicity and response criteria of the [facility]". [employer] Oncol. 5 (6): 649-55   LABORATORY DATA:  Lab Results  Component Value Date   WBC 6.3 09/02/2022  HGB 11.8 (L) 09/02/2022   HCT 34.9 (L) 09/02/2022   MCV 98.9 09/02/2022   PLT 171 09/02/2022   CMP     Component Value Date/Time   NA 135 09/02/2022 1053   K 3.1 (L) 09/02/2022 1053   CL 103 09/02/2022 1053   CO2 25 09/02/2022 1053   GLUCOSE 109 (H) 09/02/2022 1053   BUN 19 09/02/2022 1053   BUN 23.9 07/05/2015 1239   CREATININE 0.85 09/02/2022 1053   CREATININE 0.9 07/05/2015 1239   CALCIUM 8.2 (L) 09/02/2022 1053   PROT 6.3 (L) 09/02/2022 1053   ALBUMIN 3.4 (L) 09/02/2022 1053   AST 22 09/02/2022 1053   ALT 35 09/02/2022 1053   ALKPHOS 102 09/02/2022 1053   BILITOT 0.4 09/02/2022 1053   GFRNONAA >60 09/02/2022 1053   GFRAA >60 05/29/2020 1407         RADIOGRAPHY: CT MAXILLOFACIAL W CONTRAST  Result Date: 08/27/2022 CLINICAL DATA:  77 year old male with metastatic lung cancer. Abnormal mass of the left mandible/infratemporal fossa on MRI last month. Subsequent encounter. EXAM: CT MAXILLOFACIAL WITH CONTRAST TECHNIQUE: Multidetector CT imaging of the maxillofacial structures was performed with intravenous contrast. Multiplanar CT image reconstructions were also generated. RADIATION DOSE REDUCTION: This exam was performed according to the departmental dose-optimization program which includes automated exposure control,  adjustment of the mA and/or kV according to patient size and/or use of iterative reconstruction technique. CONTRAST:  75mL OMNIPAQUE IOHEXOL 300 MG/ML  SOLN COMPARISON:  Brain MRI 07/24/2022 and prior PET-CT 04/24/2022 through 08/23/2008. FINDINGS: Osseous: There is chronic asymmetric bony lucency and expansion of the mandible ramus greater on the left side, and this corresponds to the recent MRI finding. See coronal series 6, image 47 today. This bony appearance been present on prior PET-CT since 2009, and the larger left-side lesion appears only mildly enlarged over that time. These have never been hypermetabolic on PET. Superimposed motion artifact of the mandible including at the bilateral ramus where impacted bilateral mandible wisdom teeth remain, and the tooth crowns are in continuity with the lucent bone lesions. Absent dentition otherwise. No acute osseous abnormality identified. Orbits: Intact orbital walls. Orbits soft tissues appears symmetric and negative. Sinuses: Paranasal Visualized paranasal sinuses and mastoids are stable and well aerated. Soft tissues: Pharynx motion artifact. Negative visible larynx, parapharyngeal spaces, retropharyngeal space, sublingual space, submandibular spaces and parotid spaces. Bilateral masticator soft tissues also appear to remain normal in and around the chronically expanded mandible rami. Visible upper cervical lymph nodes appear negative. Visible major vascular structures in the neck and at the skull base are patent with calcified carotid atherosclerosis. Limited intracranial: Stable. IMPRESSION: Recent MRI finding corresponds to a abnormal ramus of the left mandible. But this is chronic and has not significantly changed over multiple prior PET-CTs since 2009. 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  Electronically Signed   By: Genevie Ann M.D.   On: 08/27/2022 11:06      IMPRESSION/PLAN: This is a delightful 77 year old gentleman whom I know quite well with a history of multifocal lung cancer.   Successfully treated with a recent course of SBRT for new adenocarcinoma of the left upper lung with a focus of metastatic adenocarcinoma to the right scapula.  He has also started pembrolizumab.  He states that his PCP gave him a shot and some oral medication for  his recent rash.  I recommended that he not take any steroids unless he gets approval from Dr.Katragadda as steroids can interfere with his immunotherapy.  Patient and his wife demonstrate understanding of this and will consult with med/onc accordingly in the future  I defer to Dr. Ellin Saba regarding the timing of future imaging.  The patient and his wife will call me when their imaging is scheduled so that they can see me soon after that for imaging review.  In the meantime he will continue pembrolizumab.  They expressed gratitude for the treatment that they received and it was wonderful to see them today.  I will see him back as appropriate based on the plan above    On date of service, in total, I spent 25 minutes on this encounter. Patient was seen in person.   __________________________________________   Lonie Peak, MD  This document serves as a record of services personally performed by Lonie Peak, MD. It was created on her behalf by Neena Rhymes, a trained medical scribe. The creation of this record is based on the scribe's personal observations and the provider's statements to them. This document has been checked and approved by the attending provider.

## 2022-09-23 ENCOUNTER — Ambulatory Visit: Payer: Medicare Other

## 2022-09-23 ENCOUNTER — Inpatient Hospital Stay: Payer: Medicare Other | Admitting: Hematology

## 2022-09-23 ENCOUNTER — Telehealth: Payer: Self-pay | Admitting: *Deleted

## 2022-09-23 ENCOUNTER — Other Ambulatory Visit: Payer: Medicare Other

## 2022-09-23 NOTE — Telephone Encounter (Addendum)
Called today to cancel appt due to nausea, vomiting and diarrhea that started this am.  Advised to keep appointment with Dr. Ellin Saba, as he may need fluids or other replenishments. Refused to keep appointment.  Advised to use compazine and imodium as prescribed and make [place] aware if it continues, as Rande Lawman can cause colitis.  Verbalized understanding.  Dr. Ellin Saba aware.

## 2022-09-28 ENCOUNTER — Other Ambulatory Visit: Payer: Self-pay

## 2022-09-30 ENCOUNTER — Inpatient Hospital Stay (HOSPITAL_BASED_OUTPATIENT_CLINIC_OR_DEPARTMENT_OTHER): Payer: Medicare Other | Admitting: Hematology

## 2022-09-30 ENCOUNTER — Inpatient Hospital Stay: Payer: Medicare Other

## 2022-09-30 ENCOUNTER — Inpatient Hospital Stay: Payer: Medicare Other | Attending: Physician Assistant

## 2022-09-30 ENCOUNTER — Encounter: Payer: Self-pay | Admitting: Hematology

## 2022-09-30 DIAGNOSIS — E876 Hypokalemia: Secondary | ICD-10-CM | POA: Insufficient documentation

## 2022-09-30 DIAGNOSIS — C3412 Malignant neoplasm of upper lobe, left bronchus or lung: Secondary | ICD-10-CM

## 2022-09-30 DIAGNOSIS — Z79899 Other long term (current) drug therapy: Secondary | ICD-10-CM | POA: Diagnosis not present

## 2022-09-30 DIAGNOSIS — R21 Rash and other nonspecific skin eruption: Secondary | ICD-10-CM | POA: Insufficient documentation

## 2022-09-30 DIAGNOSIS — C7951 Secondary malignant neoplasm of bone: Secondary | ICD-10-CM | POA: Insufficient documentation

## 2022-09-30 DIAGNOSIS — Z95828 Presence of other vascular implants and grafts: Secondary | ICD-10-CM

## 2022-09-30 DIAGNOSIS — Z5112 Encounter for antineoplastic immunotherapy: Secondary | ICD-10-CM | POA: Insufficient documentation

## 2022-09-30 DIAGNOSIS — E538 Deficiency of other specified B group vitamins: Secondary | ICD-10-CM | POA: Diagnosis not present

## 2022-09-30 DIAGNOSIS — D509 Iron deficiency anemia, unspecified: Secondary | ICD-10-CM | POA: Diagnosis not present

## 2022-09-30 DIAGNOSIS — Z87891 Personal history of nicotine dependence: Secondary | ICD-10-CM | POA: Diagnosis not present

## 2022-09-30 DIAGNOSIS — R748 Abnormal levels of other serum enzymes: Secondary | ICD-10-CM | POA: Insufficient documentation

## 2022-09-30 LAB — COMPREHENSIVE METABOLIC PANEL
ALT: 46 U/L — ABNORMAL HIGH (ref 0–44)
AST: 22 U/L (ref 15–41)
Albumin: 3.4 g/dL — ABNORMAL LOW (ref 3.5–5.0)
Alkaline Phosphatase: 160 U/L — ABNORMAL HIGH (ref 38–126)
Anion gap: 11 (ref 5–15)
BUN: 13 mg/dL (ref 8–23)
CO2: 23 mmol/L (ref 22–32)
Calcium: 8.3 mg/dL — ABNORMAL LOW (ref 8.9–10.3)
Chloride: 100 mmol/L (ref 98–111)
Creatinine, Ser: 0.9 mg/dL (ref 0.61–1.24)
GFR, Estimated: >60 mL/min (ref >60)
Glucose, Bld: 109 mg/dL — ABNORMAL HIGH (ref 70–99)
Potassium: 3.2 mmol/L — ABNORMAL LOW (ref 3.5–5.1)
Sodium: 134 mmol/L — ABNORMAL LOW (ref 135–145)
Total Bilirubin: 0.4 mg/dL (ref 0.3–1.2)
Total Protein: 6.6 g/dL (ref 6.5–8.1)

## 2022-09-30 LAB — MAGNESIUM: Magnesium: 1.8 mg/dL (ref 1.7–2.4)

## 2022-09-30 LAB — CBC WITH DIFFERENTIAL/PLATELET
Abs Immature Granulocytes: 0.05 10*3/uL (ref 0.00–0.07)
Basophils Absolute: 0.1 10*3/uL (ref 0.0–0.1)
Basophils Relative: 1 %
Eosinophils Absolute: 0.4 10*3/uL (ref 0.0–0.5)
Eosinophils Relative: 5 %
HCT: 36.5 % — ABNORMAL LOW (ref 39.0–52.0)
Hemoglobin: 12.2 g/dL — ABNORMAL LOW (ref 13.0–17.0)
Immature Granulocytes: 1 %
Lymphocytes Relative: 9 %
Lymphs Abs: 0.8 10*3/uL (ref 0.7–4.0)
MCH: 33 pg (ref 26.0–34.0)
MCHC: 33.4 g/dL (ref 30.0–36.0)
MCV: 98.6 fL (ref 80.0–100.0)
Monocytes Absolute: 0.6 10*3/uL (ref 0.1–1.0)
Monocytes Relative: 7 %
Neutro Abs: 6.6 10*3/uL (ref 1.7–7.7)
Neutrophils Relative %: 77 %
Platelets: 245 10*3/uL (ref 150–400)
RBC: 3.7 MIL/uL — ABNORMAL LOW (ref 4.22–5.81)
RDW: 12.5 % (ref 11.5–15.5)
WBC: 8.5 10*3/uL (ref 4.0–10.5)
nRBC: 0 % (ref 0.0–0.2)

## 2022-09-30 LAB — IRON AND TIBC
Iron: 41 ug/dL — ABNORMAL LOW (ref 45–182)
Saturation Ratios: 13 % — ABNORMAL LOW (ref 17.9–39.5)
TIBC: 310 ug/dL (ref 250–450)
UIBC: 269 ug/dL

## 2022-09-30 LAB — FERRITIN: Ferritin: 108 ng/mL (ref 24–336)

## 2022-09-30 LAB — VITAMIN B12: Vitamin B-12: 829 pg/mL (ref 180–914)

## 2022-09-30 MED ORDER — POTASSIUM CHLORIDE CRYS ER 20 MEQ PO TBCR
40.0000 meq | EXTENDED_RELEASE_TABLET | Freq: Once | ORAL | Status: AC
  Administered 2022-09-30: 40 meq via ORAL
  Filled 2022-09-30: qty 2

## 2022-09-30 MED ORDER — POTASSIUM CHLORIDE CRYS ER 20 MEQ PO TBCR: 20.0000 meq | EXTENDED_RELEASE_TABLET | Freq: Every day | ORAL | 3 refills | Status: DC

## 2022-09-30 MED ORDER — SODIUM CHLORIDE 0.9% FLUSH
10.0000 mL | INTRAVENOUS | Status: DC | PRN
  Administered 2022-09-30: 10 mL via INTRAVENOUS

## 2022-09-30 MED ORDER — HYDROXYZINE HCL 10 MG PO TABS: 10.0000 mg | ORAL_TABLET | Freq: Three times a day (TID) | ORAL | 0 refills | Status: DC | PRN

## 2022-09-30 MED ORDER — BETAMETHASONE DIPROPIONATE 0.05 % EX CREA: TOPICAL_CREAM | Freq: Two times a day (BID) | CUTANEOUS | 1 refills | Status: DC

## 2022-09-30 MED ORDER — HEPARIN SOD (PORK) LOCK FLUSH 100 UNIT/ML IV SOLN
500.0000 [IU] | Freq: Once | INTRAVENOUS | Status: AC
  Administered 2022-09-30: 500 [IU] via INTRAVENOUS

## 2022-09-30 NOTE — Progress Notes (Signed)
[facility] [address], [place] [address]   CLINIC:  Medical Oncology/Hematology  PCP:  Elfredia Nevins, MD [address] / [place] [address] [phone]   REASON FOR VISIT:  Follow-up for history of bilateral lung cancers, normocytic anemia, and B12 deficiency  PRIOR THERAPY: - Right upper lobectomy (February 2012) for right lung cancer - SBRT (November 2015) for stage I left adenocarcinoma  NGS Results: Not done  CURRENT THERAPY: Pembrolizumab  BRIEF ONCOLOGIC HISTORY:  Oncology History  Adenocarcinoma of lung (HCC)  09/30/2010 Cancer Staging   CT of chest showed spiculated lesion   10/15/2010 Surgery   Right upper lobectomy by Dr. Edwyna Shell   10/16/2010 Remission     10/24/2013 Imaging   CT Chest-  Enlarging subpleural nodule in the left lower lobe, now 9 mm, with distortion of the overlying pleura. Finding is concerning for a small bronchogenic carcinoma   01/23/2014 Imaging   CT Chest- Left lower lobe nodule has enlarged from 11/01/2012 and is worrisome for primary bronchogenic carcinoma.    07/04/2014 - 07/10/2014 Radiation Therapy   SBRT by Dr. Basilio Cairo in 3 fractions.   07/05/2015 Imaging   perifissural nodule in LLL appears slightly smaller with parenchymal opacity attrib to XRT, likely inflamm pathcy airspace dz more inferiorly in LLL, airspace dz in RLL resolved. no metastatic disease   Primary cancer of left lower lobe of lung (HCC)  06/18/2014 Initial Diagnosis   Left lower lobe nodule suspicious for primary lung carcinoma   07/04/2014 - 07/10/2014 Radiation Therapy   SBRT   10/12/2014 Imaging   No acute findings within the chest. Decrease in size of left lower lobe perifissural nodule compatible with response to therapy. No new or progressive disease identified within the chest.   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, a 77 y.o. male, seen for follow-up of metastatic lung cancer and toxicity assessment prior to next cycle of immunotherapy.  He reported worsening of the left-sided posterior chest wall rash which is itching.  He is applying hydrocortisone cream.  He still has some itching.  Denies any diarrhea.  Denies any dry cough or shortness of breath.  REVIEW OF SYSTEMS:    Review of Systems  Respiratory:  Positive for cough and shortness of breath (COPD). Negative for hemoptysis.   Musculoskeletal:  Positive for back pain.  Skin:  Positive for itching and rash.  Neurological:  Negative for dizziness, headaches and light-headedness.  Hematological:  Does not bruise/bleed easily.    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 dense RCA calcification --Non-ischemic Myoview   History of kidney stones    in the 70's   History of radiation therapy 07/04/14, 07/06/14, 07/10/14   LLL lung nodule/54 Gy/3 fx- SBRT   Hypercholesterolemia 05/06/2011    Pneumonia due to Streptococcus    HX. POST OPERATIVELY   Port-A-Cath in place 07/21/2022   Pre-diabetes    Stress fx pelvis    DUE TO AA IN 1970   Past Surgical History:  Procedure Laterality Date   BACK SURGERY  07/04/2017   fusion above previous surgery   BACK SURGERY  2011   metal plate N-2,J3   BRONCHIAL BIOPSY  06/03/2022   Procedure: BRONCHIAL BIOPSIES;  Surgeon: Josephine Igo, DO;  Location: [facility];  Service: Pulmonary;;   BRONCHIAL BRUSHINGS  06/03/2022   Procedure: BRONCHIAL BRUSHINGS;  Surgeon: Josephine Igo, DO;  Location: [facility];  Service: Pulmonary;;   BRONCHIAL NEEDLE ASPIRATION BIOPSY  06/03/2022   Procedure: BRONCHIAL NEEDLE ASPIRATION BIOPSIES;  Surgeon: Josephine Igo, DO;  Location: [facility];  Service: Pulmonary;;   Coroanry Calcium Score  03/2017   1043. Noted especially in RCA. Dense calcification, recommend Myoview over Cor CTA.   FIDUCIAL MARKER PLACEMENT  06/03/2022   Procedure: FIDUCIAL MARKER PLACEMENT;  Surgeon: Josephine Igo, DO;  Location: [facility];  Service: Pulmonary;;   IR IMAGING GUIDED PORT INSERTION  08/04/2022   LIPOMA EXCISION N/A 10/14/2017   Procedure: EXCISION LIPOMA ON BACK;  Surgeon: Lucretia Roers, MD;  Location: [facility];  Service: General;  Laterality: N/A;   LIPOMA EXCISION Left 11/19/2018   Procedure: MINOR EXCISION OF SEBACEOUS CYST NECK;  Surgeon: Lucretia Roers, MD;  Location: [facility];  Service: General;  Laterality: Left;  pt knows to arrive at 7:00   LUNG LOBECTOMY  2012   RT UPPER LOBE   NM MYOVIEW LTD  04/2017   EF 60%. Hypertensive response to exercise. (6:21 min, 7.7 METs) No EKG changes. LOW RISK    SOCIAL HISTORY:  Social History   Socioeconomic History   Marital status: Married    Spouse name: Not on file   Number of children: 3   Years of education: Not on file   Highest education level: Not on file  Occupational History   Not on file  Tobacco Use   Smoking status: Former     Packs/day: 1.50    Years: 59.00    Total pack years: 88.50    Types: Cigarettes    Quit date: 09/08/2010    Years since quitting: 12.0   Smokeless tobacco: Current    Types: Chew   Tobacco comments:    still chews tobacco, he tells me he hasn't in the past 2 days.   Vaping Use   Vaping Use: Never used  Substance and Sexual Activity   Alcohol use: No   Drug use: No   Sexual activity: Not Currently  Other Topics Concern   Not on file  Social History Narrative   Not on file   Social Determinants of Health   Financial Resource Strain: Not on file  Food Insecurity: Not on file  Transportation Needs: No Transportation Needs (07/13/2018)   PRAPARE - Transportation  Lack of Transportation (Medical): No    Lack of Transportation (Non-Medical): No  Physical Activity: Not on file  Stress: Not on file  Social Connections: Not on file  Intimate Partner Violence: Not At Risk (07/13/2018)   Humiliation, Afraid, Rape, and Kick questionnaire    Fear of Current or Ex-Partner: No    Emotionally Abused: No    Physically Abused: No    Sexually Abused: No    FAMILY HISTORY:  Family History  Problem Relation Age of Onset   Hypertension Mother    Kidney disease Father    Heart attack Brother    Cancer Maternal Uncle        prostate cancer   Cancer Maternal Uncle        prostate cancer   Cancer Maternal Uncle        prostate cancer   Cancer Maternal Uncle        prostate cancer    CURRENT MEDICATIONS:  Current Outpatient Medications  Medication Sig Dispense Refill   albuterol (PROVENTIL HFA;VENTOLIN HFA) 108 (90 Base) MCG/ACT inhaler Inhale 2 puffs into the lungs every 4 (four) hours as needed for wheezing or shortness of breath. 2 Inhaler 3   albuterol (PROVENTIL) (2.5 MG/3ML) 0.083% nebulizer solution Take 2.5 mg by nebulization every 6 (six) hours as needed for wheezing or shortness of breath.     cholecalciferol (VITAMIN D3) 25 MCG (1000 UNIT) tablet Take 1,000 Units by mouth  daily.     cimetidine (TAGAMET) 200 MG tablet Take 400-600 mg by mouth daily as needed (for heartburn).     ferrous sulfate 325 (65 FE) MG tablet Take 325 mg by mouth daily with breakfast.     fluticasone (FLONASE) 50 MCG/ACT nasal spray Place 2 sprays into both nostrils daily as needed for rhinitis.     guaiFENesin (MUCINEX) 600 MG 12 hr tablet Take 600 mg by mouth daily.     hydrochlorothiazide (HYDRODIURIL) 25 MG tablet Take 12.5 mg by mouth daily.     HYDROcodone-acetaminophen (NORCO) 10-325 MG tablet Take 1-2 tablets by mouth every 4 (four) hours as needed for moderate pain. 240 tablet 0   lidocaine-prilocaine (EMLA) cream Apply a small amount to port a cath site and cover with plastic wrap 1 hour prior to infusion appointments 30 g 3   Menthol, Topical Analgesic, (ICY HOT EX) Apply 1 Application topically daily as needed (pain).     naproxen sodium (ALEVE) 220 MG tablet Take 220 mg by mouth daily as needed (pain). (Patient not taking: Reported on 09/19/2022)     Omega-3 Fatty Acids (FISH OIL) 1000 MG CAPS Take 1,000 mg by mouth daily.     Oxycodone HCl 10 MG TABS Take 10 mg by mouth 2 (two) times daily as needed (pain).     pantoprazole (PROTONIX) 40 MG tablet Take 40 mg by mouth See admin instructions. Take 40 mg daily, may take a second 40 mg dose as needed for heartburn     PEMBROLIZUMAB IV Inject into the vein every 21 ( twenty-one) days.     polyethylene glycol powder (GLYCOLAX/MIRALAX) 17 GM/SCOOP powder Take 17 g by mouth daily as needed for mild constipation or moderate constipation.     prochlorperazine (COMPAZINE) 10 MG tablet Take 1 tablet (10 mg total) by mouth every 6 (six) hours as needed for nausea or vomiting. 30 tablet 1   rosuvastatin (CRESTOR) 40 MG tablet Take 40 mg by mouth at bedtime.     triamcinolone cream (KENALOG) 0.1 %  Apply 1 Application topically daily as needed (tick bite/itching).     triazolam (HALCION) 0.25 MG tablet Take 0.25 mg by mouth at bedtime.      vitamin B-12 (CYANOCOBALAMIN) 1000 MCG tablet Take 1,000 mcg by mouth every other day.     No current facility-administered medications for this visit.   Facility-Administered Medications Ordered in Other Visits  Medication Dose Route Frequency Provider Last Rate Last Admin   sodium chloride flush (NS) 0.9 % injection 10 mL  10 mL Intravenous PRN Doreatha Massed, MD        ALLERGIES:  Allergies  Allergen Reactions   Fentanyl Other (See Comments)    CAUSED HALLUCINATIONS/05-03-13 pt reports it was oral fentanyl that caused hallucinations    Gabapentin Other (See Comments)    Bones throbbed, headaches, weakness   Nabumetone     Unknown reaction    Aspirin Nausea Only    PHYSICAL EXAM:    Performance status (ECOG): 1 - Symptomatic but completely ambulatory  There were no vitals filed for this visit.  Wt Readings from Last 3 Encounters:  09/30/22 134 lb 9.6 oz (61.1 kg)  09/19/22 137 lb 12.8 oz (62.5 kg)  09/02/22 139 lb 15.9 oz (63.5 kg)   Physical Exam Constitutional:      Appearance: Normal appearance.  HENT:     Head: Normocephalic and atraumatic.     Mouth/Throat:     Mouth: Mucous membranes are moist.  Eyes:     Extraocular Movements: Extraocular movements intact.     Pupils: Pupils are equal, round, and reactive to light.  Cardiovascular:     Rate and Rhythm: Normal rate and regular rhythm.     Pulses: Normal pulses.     Heart sounds: Normal heart sounds.  Pulmonary:     Effort: Pulmonary effort is normal.     Breath sounds: Decreased breath sounds present.     Comments: Coarse breath sounds and decreased air movement in all lung fields. Abdominal:     General: Bowel sounds are normal.     Palpations: Abdomen is soft.     Tenderness: There is no abdominal tenderness.  Musculoskeletal:        General: No swelling.     Right lower leg: No edema.     Left lower leg: No edema.  Lymphadenopathy:     Cervical: No cervical adenopathy.  Skin:    General:  Skin is warm and dry.  Neurological:     General: No focal deficit present.     Mental Status: He is alert and oriented to person, place, and time.  Psychiatric:        Mood and Affect: Mood normal.        Behavior: Behavior normal.      LABORATORY DATA:  I have reviewed the labs as listed.     Latest Ref Rng & Units 09/02/2022   10:53 AM 08/11/2022   10:00 AM 07/21/2022   10:10 AM  CBC  WBC 4.0 - 10.5 K/uL 6.3  9.9  7.9   Hemoglobin 13.0 - 17.0 g/dL 83.3  08.3  57.7   Hematocrit 39.0 - 52.0 % 34.9  39.1  38.7   Platelets 150 - 400 K/uL 171  244  231       Latest Ref Rng & Units 09/02/2022   10:53 AM 08/11/2022   10:00 AM 07/21/2022   10:10 AM  CMP  Glucose 70 - 99 mg/dL 584  539  059   BUN  8 - 23 mg/dL 19  17  13    Creatinine 0.61 - 1.24 mg/dL 0.85  0.86  0.77   Sodium 135 - 145 mmol/L 135  136  137   Potassium 3.5 - 5.1 mmol/L 3.1  4.0  3.6   Chloride 98 - 111 mmol/L 103  99  105   CO2 22 - 32 mmol/L 25  27  26    Calcium 8.9 - 10.3 mg/dL 8.2  9.1  8.7   Total Protein 6.5 - 8.1 g/dL 6.3  7.0  6.8   Total Bilirubin 0.3 - 1.2 mg/dL 0.4  0.5  0.6   Alkaline Phos 38 - 126 U/L 102  62  57   AST 15 - 41 U/L 22  17  17    ALT 0 - 44 U/L 35  17  17     DIAGNOSTIC IMAGING:  I have independently reviewed the scans and discussed with the patient. No results found.   ASSESSMENT:  1.  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 - Status post right upper lobectomy in February 2012 for right lung cancer. - LDCT chest (04/14/2022): Enlarging spiculated nodular soft tissue thickening in the posterior left upper lobe measuring 11.4 mm. - PET scan (04/24/2022): Left upper lobe lung nodule 14 mm with SUV 3.62.  Hypermetabolic focus in the inferior aspect of the right scapula without discrete CT correlate with SUV 5.71.  No hypermetabolic adenopathy. - LUL lesion FNA (06/03/2022): Adenocarcinoma - Right scapula  biopsy (07/03/2022) adenocarcinoma consistent with lung primary - NGS: PD-L1 22 C3 TPS 100%, T p53 pathogenic variant, MSI-stable, TMB-low, no other targetable mutations - Cycle 1 of pembrolizumab started on 07/21/2022 - XRT to the right scapular lesion and left upper lobe lung lesion 5 fractions completed on 08/18/2022.  2.  Cystic lesion of left kidney - CTA PE on 04/14/2022 showed complex cystic lesion of the left kidney.  PET scan also showed exophytic 18 mm left renal lesion suspicious for malignancy. - MRI abdomen on 05/16/2022 showed benign appearing Bosniak category 1 and 2 left renal cyst with no evidence of neoplasm.  No further work-up needed.  PLAN:   1.  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 - Will hold Keytruda today. - Reviewed labs today which showed normal LFTs with mildly elevated alk phos and ALT.  CBC was grossly normal. - Will likely treat him next week if his symptoms improve. - Will plan to repeat PET scan after cycle 4.   2.  Left posterior chest wall rash: - Erythematous maculopapular rash on the upper back, predominantly on the left side, intensely itching. - Will hold Keytruda.  Will give him hydroxyzine 10 mg every 8 hours as needed.  Will give him Medrol Dosepak. - Will also give him betamethasone dipropionate cream twice daily.   3.  B12 deficiency - Continue B12 every other day and iron tablet twice daily.  4.  Chronic back pain after 20+ years duration: - Continue narcotics per Dr. Delice Bison office.  5.  Hypokalemia: - He will receive potassium today.  Will start him on potassium 20 mEq daily.     All questions were answered. The patient knows to call the clinic with any problems, questions or concerns.    Derek Jack, MD

## 2022-09-30 NOTE — Progress Notes (Signed)
Patients port flushed without difficulty.  Good blood return noted with no bruising or swelling noted at site.  Patient remains accessed for chemotherapy treatment.  

## 2022-09-30 NOTE — Patient Instructions (Addendum)
[facility] at [facility] Discharge Instructions   You were seen and examined today by Dr. Ellin Saba.  He reviewed the results of your lab work which is mostly normal/stable. Your potassium is slightly low at 3.2. All other results are normal/stable.   We sent a prescription to your pharmacy for a potassium pill. You will take one pill a day.   We will hold your treatment today. Take the steroid dose pack as prescribed. We sent a prescription for a medication called Atarax to help with the itching.   We will plan to restart your treatment next week.   Return as scheduled.    Thank you for choosing [facility] at [facility] to provide your oncology and hematology care.  To afford each patient quality time with our provider, please arrive at least 15 minutes before your scheduled appointment time.   If you have a lab appointment with the Cancer Center please come in thru the Main Entrance and check in at the main information desk.  You need to re-schedule your appointment should you arrive 10 or more minutes late.  We strive to give you quality time with our providers, and arriving late affects you and other patients whose appointments are after yours.  Also, if you no show three or more times for appointments you may be dismissed from the clinic at the providers discretion.     Again, thank you for choosing [facility].  Our hope is that these requests will decrease the amount of time that you wait before being seen by our physicians.       _____________________________________________________________  Should you have questions after your visit to [facility], please contact our office at [phone] and follow the prompts.  Our office hours are 8:00 a.m. and 4:30 p.m. Monday - Friday.  Please note that voicemails left after 4:00 p.m. may not be returned until the following business day.  We are closed weekends and  major holidays.  You do have access to a nurse 24-7, just call the main number to the clinic [phone] and do not press any options, hold on the line and a nurse will answer the phone.    For prescription refill requests, have your pharmacy contact our office and allow 72 hours.    Due to Covid, you will need to wear a mask upon entering the hospital. If you do not have a mask, a mask will be given to you at the Main Entrance upon arrival. For doctor visits, patients may have 1 support person age 28 or older with them. For treatment visits, patients can not have anyone with them due to social distancing guidelines and our immunocompromised population.

## 2022-09-30 NOTE — Progress Notes (Signed)
No treatment today per MD, due to a severe rash. Will give potassium as ordered per MD

## 2022-10-07 ENCOUNTER — Inpatient Hospital Stay: Payer: Medicare Other

## 2022-10-07 ENCOUNTER — Other Ambulatory Visit: Payer: Self-pay

## 2022-10-07 VITALS — BP 130/75 | HR 72 | Temp 97.7°F | Resp 16

## 2022-10-07 DIAGNOSIS — Z5112 Encounter for antineoplastic immunotherapy: Secondary | ICD-10-CM | POA: Diagnosis not present

## 2022-10-07 DIAGNOSIS — Z95828 Presence of other vascular implants and grafts: Secondary | ICD-10-CM

## 2022-10-07 DIAGNOSIS — R21 Rash and other nonspecific skin eruption: Secondary | ICD-10-CM | POA: Diagnosis not present

## 2022-10-07 DIAGNOSIS — C3412 Malignant neoplasm of upper lobe, left bronchus or lung: Secondary | ICD-10-CM

## 2022-10-07 DIAGNOSIS — C7951 Secondary malignant neoplasm of bone: Secondary | ICD-10-CM | POA: Diagnosis not present

## 2022-10-07 DIAGNOSIS — E538 Deficiency of other specified B group vitamins: Secondary | ICD-10-CM | POA: Diagnosis not present

## 2022-10-07 DIAGNOSIS — R748 Abnormal levels of other serum enzymes: Secondary | ICD-10-CM | POA: Diagnosis not present

## 2022-10-07 LAB — COMPREHENSIVE METABOLIC PANEL
ALT: 36 U/L (ref 0–44)
AST: 21 U/L (ref 15–41)
Albumin: 3.6 g/dL (ref 3.5–5.0)
Alkaline Phosphatase: 116 U/L (ref 38–126)
Anion gap: 9 (ref 5–15)
BUN: 22 mg/dL (ref 8–23)
CO2: 25 mmol/L (ref 22–32)
Calcium: 8.5 mg/dL — ABNORMAL LOW (ref 8.9–10.3)
Chloride: 99 mmol/L (ref 98–111)
Creatinine, Ser: 0.92 mg/dL (ref 0.61–1.24)
GFR, Estimated: >60 mL/min (ref >60)
Glucose, Bld: 104 mg/dL — ABNORMAL HIGH (ref 70–99)
Potassium: 3.3 mmol/L — ABNORMAL LOW (ref 3.5–5.1)
Sodium: 133 mmol/L — ABNORMAL LOW (ref 135–145)
Total Bilirubin: 0.4 mg/dL (ref 0.3–1.2)
Total Protein: 6.6 g/dL (ref 6.5–8.1)

## 2022-10-07 LAB — CBC WITH DIFFERENTIAL/PLATELET
Abs Immature Granulocytes: 0.1 10*3/uL — ABNORMAL HIGH (ref 0.00–0.07)
Basophils Absolute: 0.1 10*3/uL (ref 0.0–0.1)
Basophils Relative: 1 %
Eosinophils Absolute: 0.1 10*3/uL (ref 0.0–0.5)
Eosinophils Relative: 1 %
HCT: 39.6 % (ref 39.0–52.0)
Hemoglobin: 13.1 g/dL (ref 13.0–17.0)
Immature Granulocytes: 1 %
Lymphocytes Relative: 7 %
Lymphs Abs: 1 10*3/uL (ref 0.7–4.0)
MCH: 32.8 pg (ref 26.0–34.0)
MCHC: 33.1 g/dL (ref 30.0–36.0)
MCV: 99 fL (ref 80.0–100.0)
Monocytes Absolute: 0.9 10*3/uL (ref 0.1–1.0)
Monocytes Relative: 7 %
Neutro Abs: 11.4 10*3/uL — ABNORMAL HIGH (ref 1.7–7.7)
Neutrophils Relative %: 83 %
Platelets: 300 10*3/uL (ref 150–400)
RBC: 4 MIL/uL — ABNORMAL LOW (ref 4.22–5.81)
RDW: 12.9 % (ref 11.5–15.5)
WBC: 13.6 10*3/uL — ABNORMAL HIGH (ref 4.0–10.5)
nRBC: 0 % (ref 0.0–0.2)

## 2022-10-07 LAB — MAGNESIUM: Magnesium: 2 mg/dL (ref 1.7–2.4)

## 2022-10-07 MED ORDER — SODIUM CHLORIDE 0.9% FLUSH
10.0000 mL | INTRAVENOUS | Status: AC | PRN
  Administered 2022-10-07: 10 mL

## 2022-10-07 MED ORDER — SODIUM CHLORIDE 0.9 % IV SOLN: Freq: Once | INTRAVENOUS | Status: AC

## 2022-10-07 MED ORDER — POTASSIUM CHLORIDE CRYS ER 20 MEQ PO TBCR
40.0000 meq | EXTENDED_RELEASE_TABLET | Freq: Once | ORAL | Status: AC
  Administered 2022-10-07: 40 meq via ORAL
  Filled 2022-10-07: qty 2

## 2022-10-07 MED ORDER — HEPARIN SOD (PORK) LOCK FLUSH 100 UNIT/ML IV SOLN
500.0000 [IU] | Freq: Once | INTRAVENOUS | Status: AC | PRN
  Administered 2022-10-07: 500 [IU]

## 2022-10-07 MED ORDER — SODIUM CHLORIDE 0.9% FLUSH
10.0000 mL | Freq: Once | INTRAVENOUS | Status: AC
  Administered 2022-10-07: 10 mL

## 2022-10-07 MED ORDER — SODIUM CHLORIDE 0.9 % IV SOLN
200.0000 mg | Freq: Once | INTRAVENOUS | Status: AC
  Administered 2022-10-07: 200 mg via INTRAVENOUS
  Filled 2022-10-07: qty 8

## 2022-10-07 NOTE — Progress Notes (Addendum)
Pt presents today for Keytruda per provider's order. Vital signs and labs WNL for treatment. Pt's potassium is noted to be 3.3 today per Dr.K's standing order, pt will receive 40 mEq p.o x 1 dose. Okay to proceed with treatment today.  Keytruda and potassium chloride 40 mEq p.o x 1 dose given today per MD orders. Tolerated infusion without adverse affects. Vital signs stable. No complaints at this time. Discharged from clinic ambulatory in stable condition. Alert and oriented x 3. F/U with [facility] as scheduled.

## 2022-10-07 NOTE — Addendum Note (Signed)
Addended by: Terrace Arabia on: 10/07/2022 01:34 PM   Modules accepted: Orders

## 2022-10-07 NOTE — Patient Instructions (Signed)
[facility]  Discharge Instructions: Thank you for choosing [facility] to provide your oncology and hematology care.  If you have a lab appointment with the [facility], please come in thru the Main Entrance and check in at the main information desk.  Wear comfortable clothing and clothing appropriate for easy access to any Portacath or PICC line.   We strive to give you quality time with your provider. You may need to reschedule your appointment if you arrive late (15 or more minutes).  Arriving late affects you and other patients whose appointments are after yours.  Also, if you miss three or more appointments without notifying the office, you may be dismissed from the clinic at the provider's discretion.      For prescription refill requests, have your pharmacy contact our office and allow 72 hours for refills to be completed.    Today you received the following chemotherapy and/or immunotherapy agents Beryle Flock    To help prevent nausea and vomiting after your treatment, we encourage you to take your nausea medication as directed.  Pembrolizumab Injection What is this medication? PEMBROLIZUMAB (PEM broe LIZ ue mab) treats some types of cancer. It works by helping your immune system slow or stop the spread of cancer cells. It is a monoclonal antibody. This medicine may be used for other purposes; ask your health care provider or pharmacist if you have questions. COMMON BRAND NAME(S): Keytruda What should I tell my care team before I take this medication? They need to know if you have any of these conditions: Allogeneic stem cell transplant (uses someone else's stem cells) Autoimmune diseases, such as Crohn disease, ulcerative colitis, lupus History of chest radiation Nervous system problems, such as Guillain-Barre syndrome, myasthenia gravis Organ transplant An unusual or allergic reaction to pembrolizumab, other medications, foods, dyes, or  preservatives Pregnant or trying to get pregnant Breast-feeding How should I use this medication? This medication is injected into a vein. It is given by your care team in a hospital or clinic setting. A special MedGuide will be given to you before each treatment. Be sure to read this information carefully each time. Talk to your care team about the use of this medication in children. While it may be prescribed for children as young as 6 months for selected conditions, precautions do apply. Overdosage: If you think you have taken too much of this medicine contact a poison control center or emergency room at once. NOTE: This medicine is only for you. Do not share this medicine with others. What if I miss a dose? Keep appointments for follow-up doses. It is important not to miss your dose. Call your care team if you are unable to keep an appointment. What may interact with this medication? Interactions have not been studied. This list may not describe all possible interactions. Give your health care provider a list of all the medicines, herbs, non-prescription drugs, or dietary supplements you use. Also tell them if you smoke, drink alcohol, or use illegal drugs. Some items may interact with your medicine. What should I watch for while using this medication? Your condition will be monitored carefully while you are receiving this medication. You may need blood work while taking this medication. This medication may cause serious skin reactions. They can happen weeks to months after starting the medication. Contact your care team right away if you notice fevers or flu-like symptoms with a rash. The rash may be red or purple and then turn  into blisters or peeling of the skin. You may also notice a red rash with swelling of the face, lips, or lymph nodes in your neck or under your arms. Tell your care team right away if you have any change in your eyesight. Talk to your care team if you may be pregnant.  Serious birth defects can occur if you take this medication during pregnancy and for 4 months after the last dose. You will need a negative pregnancy test before starting this medication. Contraception is recommended while taking this medication and for 4 months after the last dose. Your care team can help you find the option that works for you. Do not breastfeed while taking this medication and for 4 months after the last dose. What side effects may I notice from receiving this medication? Side effects that you should report to your care team as soon as possible: Allergic reactions--skin rash, itching, hives, swelling of the face, lips, tongue, or throat Dry cough, shortness of breath or trouble breathing Eye pain, redness, irritation, or discharge with blurry or decreased vision Heart muscle inflammation--unusual weakness or fatigue, shortness of breath, chest pain, fast or irregular heartbeat, dizziness, swelling of the ankles, feet, or hands Hormone gland problems--headache, sensitivity to light, unusual weakness or fatigue, dizziness, fast or irregular heartbeat, increased sensitivity to cold or heat, excessive sweating, constipation, hair loss, increased thirst or amount of urine, tremors or shaking, irritability Infusion reactions--chest pain, shortness of breath or trouble breathing, feeling faint or lightheaded Kidney injury (glomerulonephritis)--decrease in the amount of urine, red or dark brown urine, foamy or bubbly urine, swelling of the ankles, hands, or feet Liver injury--right upper belly pain, loss of appetite, nausea, light-colored stool, dark yellow or brown urine, yellowing skin or eyes, unusual weakness or fatigue Pain, tingling, or numbness in the hands or feet, muscle weakness, change in vision, confusion or trouble speaking, loss of balance or coordination, trouble walking, seizures Rash, fever, and swollen lymph nodes Redness, blistering, peeling, or loosening of the skin,  including inside the mouth Sudden or severe stomach pain, bloody diarrhea, fever, nausea, vomiting Side effects that usually do not require medical attention (report to your care team if they continue or are bothersome): Bone, joint, or muscle pain Diarrhea Fatigue Loss of appetite Nausea Skin rash This list may not describe all possible side effects. Call your doctor for medical advice about side effects. You may report side effects to FDA at 1-800-FDA-1088. Where should I keep my medication? This medication is given in a hospital or clinic. It will not be stored at home. NOTE: This sheet is a summary. It may not cover all possible information. If you have questions about this medicine, talk to your doctor, pharmacist, or health care provider.  2023 Elsevier/Gold Standard (2013-05-16 00:00:00)   BELOW ARE SYMPTOMS THAT SHOULD BE REPORTED IMMEDIATELY: *FEVER GREATER THAN 100.4 F (38 C) OR HIGHER *CHILLS OR SWEATING *NAUSEA AND VOMITING THAT IS NOT CONTROLLED WITH YOUR NAUSEA MEDICATION *UNUSUAL SHORTNESS OF BREATH *UNUSUAL BRUISING OR BLEEDING *URINARY PROBLEMS (pain or burning when urinating, or frequent urination) *BOWEL PROBLEMS (unusual diarrhea, constipation, pain near the anus) TENDERNESS IN MOUTH AND THROAT WITH OR WITHOUT PRESENCE OF ULCERS (sore throat, sores in mouth, or a toothache) UNUSUAL RASH, SWELLING OR PAIN  UNUSUAL VAGINAL DISCHARGE OR ITCHING   Items with * indicate a potential emergency and should be followed up as soon as possible or go to the Emergency Department if any problems should occur.  Please show the  CHEMOTHERAPY ALERT CARD or IMMUNOTHERAPY ALERT CARD at check-in to the Emergency Department and triage nurse.  Should you have questions after your visit or need to cancel or reschedule your appointment, please contact [facility] [phone]  and follow the prompts.  Office hours are 8:00 a.m. to 4:30 p.m. Monday - Friday. Please  note that voicemails left after 4:00 p.m. may not be returned until the following business day.  We are closed weekends and major holidays. You have access to a nurse at all times for urgent questions. Please call the main number to the clinic [phone] and follow the prompts.  For any non-urgent questions, you may also contact your provider using MyChart. We now offer e-Visits for anyone 28 and older to request care online for non-urgent symptoms. For details visit [email or web address].   Also download the MyChart app! Go to the app store, search "MyChart", open the app, select , and log in with your MyChart username and password.

## 2022-10-07 NOTE — Addendum Note (Signed)
Addended by: Terrace Arabia on: 10/07/2022 01:57 PM   Modules accepted: Orders

## 2022-10-10 ENCOUNTER — Other Ambulatory Visit: Payer: Self-pay

## 2022-10-14 ENCOUNTER — Other Ambulatory Visit: Payer: Medicare Other

## 2022-10-14 ENCOUNTER — Ambulatory Visit: Payer: Medicare Other | Admitting: Hematology

## 2022-10-14 ENCOUNTER — Ambulatory Visit: Payer: Medicare Other

## 2022-10-20 DIAGNOSIS — M5136 Other intervertebral disc degeneration, lumbar region: Secondary | ICD-10-CM | POA: Diagnosis not present

## 2022-10-20 DIAGNOSIS — Z6821 Body mass index (BMI) 21.0-21.9, adult: Secondary | ICD-10-CM | POA: Diagnosis not present

## 2022-10-20 DIAGNOSIS — C3490 Malignant neoplasm of unspecified part of unspecified bronchus or lung: Secondary | ICD-10-CM | POA: Diagnosis not present

## 2022-10-20 DIAGNOSIS — I10 Essential (primary) hypertension: Secondary | ICD-10-CM | POA: Diagnosis not present

## 2022-10-20 DIAGNOSIS — M1991 Primary osteoarthritis, unspecified site: Secondary | ICD-10-CM | POA: Diagnosis not present

## 2022-10-20 DIAGNOSIS — J449 Chronic obstructive pulmonary disease, unspecified: Secondary | ICD-10-CM | POA: Diagnosis not present

## 2022-10-20 DIAGNOSIS — M48062 Spinal stenosis, lumbar region with neurogenic claudication: Secondary | ICD-10-CM | POA: Diagnosis not present

## 2022-10-20 DIAGNOSIS — I700 Atherosclerosis of aorta: Secondary | ICD-10-CM | POA: Diagnosis not present

## 2022-10-23 ENCOUNTER — Encounter (HOSPITAL_COMMUNITY)
Admission: RE | Admit: 2022-10-23 | Discharge: 2022-10-23 | Disposition: A | Discharge status: Home / Self Care | Payer: Medicare Other | Source: Ambulatory Visit | Attending: Hematology | Admitting: Hematology

## 2022-10-23 DIAGNOSIS — J432 Centrilobular emphysema: Secondary | ICD-10-CM | POA: Diagnosis not present

## 2022-10-23 DIAGNOSIS — C3412 Malignant neoplasm of upper lobe, left bronchus or lung: Secondary | ICD-10-CM | POA: Insufficient documentation

## 2022-10-23 DIAGNOSIS — C3490 Malignant neoplasm of unspecified part of unspecified bronchus or lung: Secondary | ICD-10-CM | POA: Diagnosis not present

## 2022-10-23 DIAGNOSIS — I2510 Atherosclerotic heart disease of native coronary artery without angina pectoris: Secondary | ICD-10-CM | POA: Diagnosis not present

## 2022-10-23 DIAGNOSIS — K449 Diaphragmatic hernia without obstruction or gangrene: Secondary | ICD-10-CM | POA: Diagnosis not present

## 2022-10-23 MED ORDER — FLUDEOXYGLUCOSE F - 18 (FDG) INJECTION
7.1500 | Freq: Once | INTRAVENOUS | Status: AC | PRN
  Administered 2022-10-23: 7.15 via INTRAVENOUS

## 2022-10-27 ENCOUNTER — Other Ambulatory Visit: Payer: Self-pay

## 2022-10-27 ENCOUNTER — Inpatient Hospital Stay: Payer: Medicare Other

## 2022-10-27 ENCOUNTER — Encounter: Payer: Self-pay | Admitting: Radiation Oncology

## 2022-10-27 DIAGNOSIS — C3412 Malignant neoplasm of upper lobe, left bronchus or lung: Secondary | ICD-10-CM

## 2022-10-27 NOTE — Progress Notes (Signed)
I let the patient and his wife know that his PET scan looks excellent.  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  He sees medical oncology tomorrow and anticipates he will probably continue pembrolizumab.  I will see him back as needed.  I told him not to hesitate to call us if he ever has questions about future scans and he can always ask medical oncology to refer him back to [place] as needed.  He and his wife are pleased with that plan.  He denies any severe pain in the right scapula but does seem to have some pain in that general area, though not nearly as severe as it was before treatment.  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  -----------------------------------  Eppie Gibson, MD

## 2022-10-28 ENCOUNTER — Inpatient Hospital Stay: Payer: Medicare Other | Attending: Physician Assistant | Admitting: Hematology

## 2022-10-28 ENCOUNTER — Inpatient Hospital Stay: Payer: Medicare Other

## 2022-10-28 VITALS — BP 111/64 | HR 59 | Temp 97.5°F | Resp 17

## 2022-10-28 DIAGNOSIS — Z5112 Encounter for antineoplastic immunotherapy: Secondary | ICD-10-CM | POA: Diagnosis not present

## 2022-10-28 DIAGNOSIS — Z95828 Presence of other vascular implants and grafts: Secondary | ICD-10-CM

## 2022-10-28 DIAGNOSIS — Z79899 Other long term (current) drug therapy: Secondary | ICD-10-CM | POA: Diagnosis not present

## 2022-10-28 DIAGNOSIS — C7951 Secondary malignant neoplasm of bone: Secondary | ICD-10-CM | POA: Diagnosis not present

## 2022-10-28 DIAGNOSIS — Z87891 Personal history of nicotine dependence: Secondary | ICD-10-CM | POA: Diagnosis not present

## 2022-10-28 DIAGNOSIS — C3412 Malignant neoplasm of upper lobe, left bronchus or lung: Secondary | ICD-10-CM

## 2022-10-28 LAB — CBC WITH DIFFERENTIAL/PLATELET
Abs Immature Granulocytes: 0.05 10*3/uL (ref 0.00–0.07)
Basophils Absolute: 0.1 10*3/uL (ref 0.0–0.1)
Basophils Relative: 1 %
Eosinophils Absolute: 0.6 10*3/uL — ABNORMAL HIGH (ref 0.0–0.5)
Eosinophils Relative: 7 %
HCT: 36.3 % — ABNORMAL LOW (ref 39.0–52.0)
Hemoglobin: 12 g/dL — ABNORMAL LOW (ref 13.0–17.0)
Immature Granulocytes: 1 %
Lymphocytes Relative: 8 %
Lymphs Abs: 0.7 10*3/uL (ref 0.7–4.0)
MCH: 32.5 pg (ref 26.0–34.0)
MCHC: 33.1 g/dL (ref 30.0–36.0)
MCV: 98.4 fL (ref 80.0–100.0)
Monocytes Absolute: 0.7 10*3/uL (ref 0.1–1.0)
Monocytes Relative: 9 %
Neutro Abs: 6.2 10*3/uL (ref 1.7–7.7)
Neutrophils Relative %: 74 %
Platelets: 284 10*3/uL (ref 150–400)
RBC: 3.69 MIL/uL — ABNORMAL LOW (ref 4.22–5.81)
RDW: 12.7 % (ref 11.5–15.5)
WBC: 8.3 10*3/uL (ref 4.0–10.5)
nRBC: 0 % (ref 0.0–0.2)

## 2022-10-28 LAB — COMPREHENSIVE METABOLIC PANEL
ALT: 17 U/L (ref 0–44)
AST: 18 U/L (ref 15–41)
Albumin: 3.2 g/dL — ABNORMAL LOW (ref 3.5–5.0)
Alkaline Phosphatase: 99 U/L (ref 38–126)
Anion gap: 8 (ref 5–15)
BUN: 9 mg/dL (ref 8–23)
CO2: 25 mmol/L (ref 22–32)
Calcium: 8.3 mg/dL — ABNORMAL LOW (ref 8.9–10.3)
Chloride: 100 mmol/L (ref 98–111)
Creatinine, Ser: 0.77 mg/dL (ref 0.61–1.24)
GFR, Estimated: >60 mL/min (ref >60)
Glucose, Bld: 109 mg/dL — ABNORMAL HIGH (ref 70–99)
Potassium: 3.8 mmol/L (ref 3.5–5.1)
Sodium: 133 mmol/L — ABNORMAL LOW (ref 135–145)
Total Bilirubin: 0.4 mg/dL (ref 0.3–1.2)
Total Protein: 6.2 g/dL — ABNORMAL LOW (ref 6.5–8.1)

## 2022-10-28 LAB — MAGNESIUM: Magnesium: 1.8 mg/dL (ref 1.7–2.4)

## 2022-10-28 MED ORDER — HEPARIN SOD (PORK) LOCK FLUSH 100 UNIT/ML IV SOLN
500.0000 [IU] | Freq: Once | INTRAVENOUS | Status: AC | PRN
  Administered 2022-10-28: 500 [IU]

## 2022-10-28 MED ORDER — SODIUM CHLORIDE 0.9 % IV SOLN: Freq: Once | INTRAVENOUS | Status: AC

## 2022-10-28 MED ORDER — SODIUM CHLORIDE 0.9% FLUSH
10.0000 mL | Freq: Once | INTRAVENOUS | Status: AC
  Administered 2022-10-28: 10 mL via INTRAVENOUS

## 2022-10-28 MED ORDER — SODIUM CHLORIDE 0.9% FLUSH
10.0000 mL | INTRAVENOUS | Status: AC | PRN
  Administered 2022-10-28: 10 mL

## 2022-10-28 MED ORDER — SODIUM CHLORIDE 0.9 % IV SOLN
200.0000 mg | Freq: Once | INTRAVENOUS | Status: AC
  Administered 2022-10-28: 200 mg via INTRAVENOUS
  Filled 2022-10-28: qty 8

## 2022-10-28 NOTE — Progress Notes (Addendum)
Patient presents today for Keytruda infusion per providers order.  Vital signs and labs reviewed by MD and within parameters for treatment.  Message received from Anastasio Champion RN/Dr. Delton Coombes patient okay for treatment.  Treatment given today per MD orders.  Stable during infusion without adverse affects.  Vital signs stable.  No complaints at this time.  Discharge from clinic ambulatory in stable condition.  Alert and oriented X 3.  Follow up with [facility] as scheduled.

## 2022-10-28 NOTE — Addendum Note (Signed)
Addended by: Fayne Norrie on: 10/28/2022 02:09 PM   Modules accepted: Orders

## 2022-10-28 NOTE — Progress Notes (Signed)
Maurice Orozco, [place] [address]   CLINIC:  Medical Oncology/Hematology  PCP:  [facility], [place] / [place] [address] [phone]   REASON FOR VISIT:  Follow-up for history of bilateral lung cancers, normocytic anemia, and B12 deficiency  PRIOR THERAPY: - Right upper lobectomy (February 2012) for right lung cancer - SBRT (November 2015) for stage I left adenocarcinoma  NGS Results: Not done  CURRENT THERAPY: Pembrolizumab  BRIEF ONCOLOGIC HISTORY:  Oncology History  Adenocarcinoma of lung ([place])  09/30/2010 Cancer Staging   CT of chest showed spiculated lesion   10/15/2010 Surgery   Right upper lobectomy by Dr. Arlyce Dice   10/16/2010 Remission     10/24/2013 Imaging   CT Chest-  Enlarging subpleural nodule in the left lower lobe, now 9 mm, with distortion of the overlying pleura. Finding is concerning for a small bronchogenic carcinoma   01/23/2014 Imaging   CT Chest- Left lower lobe nodule has enlarged from 11/01/2012 and is worrisome for primary bronchogenic carcinoma.    07/04/2014 - 07/10/2014 Radiation Therapy   SBRT by Dr. Isidore Moos in 3 fractions.   07/05/2015 Imaging   perifissural nodule in LLL appears slightly smaller with parenchymal opacity attrib to XRT, likely inflamm pathcy airspace dz more inferiorly in LLL, airspace dz in RLL resolved. no metastatic disease   Primary cancer of left lower lobe of lung ([place])  06/18/2014 Initial Diagnosis   Left lower lobe nodule suspicious for primary lung carcinoma   07/04/2014 - 07/10/2014 Radiation Therapy   SBRT   10/12/2014 Imaging   No acute findings within the chest. Decrease in size of left lower lobe perifissural nodule compatible with response to therapy. No new or progressive disease identified within the chest.   Adenocarcinoma of upper lobe of left lung ([place])  07/16/2022 Initial Diagnosis   Adenocarcinoma of upper lobe of left lung ([place])   07/21/2022 -   Chemotherapy   Patient is on Treatment Plan : LUNG NSCLC Pembrolizumab (200) q21d       CANCER STAGING:  Cancer Staging  Adenocarcinoma of lung ([place]) Staging form: Lung, AJCC 7th Edition - Clinical: Stage IA (T1a, N0, M0) - Signed by Baird Cancer, PA on 05/06/2011  Adenocarcinoma of upper lobe of left lung ([place]) Staging form: Lung, AJCC 8th Edition - Clinical stage from 07/16/2022: Stage IVA (cT1b, cN0, pM1b) - Unsigned   INTERVAL HISTORY:  Maurice Orozco, a 77 y.o. male, seen for follow-up of metastatic lung cancer and toxicity assessment prior to next cycle of immunotherapy.  REVIEW OF SYSTEMS:    Review of Systems  HENT:   Positive for trouble swallowing.   Respiratory:  Positive for shortness of breath (COPD).   Skin:  Positive for rash.  Neurological:  Negative for dizziness, headaches and light-headedness.  Hematological:  Does not bruise/bleed easily.    PAST MEDICAL/SURGICAL HISTORY:  Past Medical History:  Diagnosis Date   Adenocarcinoma of lung ([place]) 05/06/2011   rt lung/dx 2011/surg only   Allergy    Anemia    2020 iron infusion   Anxiety    Arthritis    finger   Back fracture    DUE TO AA IN 1970   Chronic back pain    COPD (chronic obstructive pulmonary disease) with emphysema ([facility]) 05/06/2011   Essential hypertension 05/06/2011   Gallstones    GERD (gastroesophageal reflux disease)    High cholesterol    High coronary artery calcium score  Julye 2018   Agaston Score 1043. dense RCA calcification --Non-ischemic Myoview   History of kidney stones    in the 70's   History of radiation therapy 07/04/14, 07/06/14, 07/10/14   LLL lung nodule/54 Gy/3 fx- SBRT   Hypercholesterolemia 05/06/2011   Pneumonia due to Streptococcus    HX. POST OPERATIVELY   Port-A-Cath in place 07/21/2022   Pre-diabetes    Stress fx pelvis    DUE TO AA IN 1970   Past Surgical History:  Procedure Laterality Date   BACK SURGERY  07/04/2017   fusion above previous  surgery   BACK SURGERY  2011   metal plate l-4,L5   BRONCHIAL BIOPSY  06/03/2022   Procedure: BRONCHIAL BIOPSIES;  Surgeon: Garner Nash, DO;  Location: [facility];  Service: Pulmonary;;   BRONCHIAL BRUSHINGS  06/03/2022   Procedure: BRONCHIAL BRUSHINGS;  Surgeon: Garner Nash, DO;  Location: [facility];  Service: Pulmonary;;   BRONCHIAL NEEDLE ASPIRATION BIOPSY  06/03/2022   Procedure: BRONCHIAL NEEDLE ASPIRATION BIOPSIES;  Surgeon: Garner Nash, DO;  Location: [facility];  Service: Pulmonary;;   Coroanry Calcium Score  03/2017   1043. Noted especially in RCA. Dense calcification, recommend Myoview over Cor CTA.   FIDUCIAL MARKER PLACEMENT  06/03/2022   Procedure: FIDUCIAL MARKER PLACEMENT;  Surgeon: Garner Nash, DO;  Location: [facility];  Service: Pulmonary;;   IR IMAGING GUIDED PORT INSERTION  08/04/2022   LIPOMA EXCISION N/A 10/14/2017   Procedure: EXCISION LIPOMA ON BACK;  Surgeon: Virl Cagey, MD;  Location: [facility];  Service: General;  Laterality: N/A;   LIPOMA EXCISION Left 11/19/2018   Procedure: MINOR EXCISION OF SEBACEOUS CYST NECK;  Surgeon: Virl Cagey, MD;  Location: [facility];  Service: General;  Laterality: Left;  pt knows to arrive at 7:00   LUNG LOBECTOMY  2012   RT Tok  04/2017   EF 60%. Hypertensive response to exercise. (6:21 min, 7.7 METs) No EKG changes. LOW RISK    SOCIAL HISTORY:  Social History   Socioeconomic History   Marital status: Married    Spouse name: Not on file   Number of children: 3   Years of education: Not on file   Highest education level: Not on file  Occupational History   Not on file  Tobacco Use   Smoking status: Former    Packs/day: 1.50    Years: 59.00    Total pack years: 88.50    Types: Cigarettes    Quit date: 09/08/2010    Years since quitting: 12.1   Smokeless tobacco: Current    Types: Chew   Tobacco comments:    still chews tobacco, he tells me he hasn't in the past 2  days.   Vaping Use   Vaping Use: Never used  Substance and Sexual Activity   Alcohol use: No   Drug use: No   Sexual activity: Not Currently  Other Topics Concern   Not on file  Social History Narrative   Not on file   Social Determinants of Health   Financial Resource Strain: Not on file  Food Insecurity: Not on file  Transportation Needs: No Transportation Needs (07/13/2018)   PRAPARE - Hydrologist (Medical): No    Lack of Transportation (Non-Medical): No  Physical Activity: Not on file  Stress: Not on file  Social Connections: Not on file  Intimate Partner Violence: Not At Risk (07/13/2018)  Humiliation, Afraid, Rape, and Kick questionnaire    Fear of Current or Ex-Partner: No    Emotionally Abused: No    Physically Abused: No    Sexually Abused: No    FAMILY HISTORY:  Family History  Problem Relation Age of Onset   Hypertension Mother    Kidney disease Father    Heart attack Brother    Cancer Maternal Uncle        prostate cancer   Cancer Maternal Uncle        prostate cancer   Cancer Maternal Uncle        prostate cancer   Cancer Maternal Uncle        prostate cancer    CURRENT MEDICATIONS:  Current Outpatient Medications  Medication Sig Dispense Refill   albuterol (PROVENTIL HFA;VENTOLIN HFA) 108 (90 Base) MCG/ACT inhaler Inhale 2 puffs into the lungs every 4 (four) hours as needed for wheezing or shortness of breath. 2 Inhaler 3   albuterol (PROVENTIL) (2.5 MG/3ML) 0.083% nebulizer solution Take 2.5 mg by nebulization every 6 (six) hours as needed for wheezing or shortness of breath.     betamethasone dipropionate 0.05 % cream Apply topically 2 (two) times daily. 30 g 1   cholecalciferol (VITAMIN D3) 25 MCG (1000 UNIT) tablet Take 1,000 Units by mouth daily.     cimetidine (TAGAMET) 200 MG tablet Take 400-600 mg by mouth daily as needed (for heartburn).     ferrous sulfate 325 (65 FE) MG tablet Take 325 mg by mouth daily with  breakfast.     fluticasone (FLONASE) 50 MCG/ACT nasal spray Place 2 sprays into both nostrils daily as needed for rhinitis.     guaiFENesin (MUCINEX) 600 MG 12 hr tablet Take 600 mg by mouth daily.     hydrochlorothiazide (HYDRODIURIL) 25 MG tablet Take 12.5 mg by mouth daily.     HYDROcodone-acetaminophen (NORCO) 10-325 MG tablet Take 1-2 tablets by mouth every 4 (four) hours as needed for moderate pain. 240 tablet 0   hydrOXYzine (ATARAX) 10 MG tablet Take 1 tablet (10 mg total) by mouth 3 (three) times daily as needed. 30 tablet 0   Menthol, Topical Analgesic, (ICY HOT EX) Apply 1 Application topically daily as needed (pain).     naproxen sodium (ALEVE) 220 MG tablet Take 220 mg by mouth daily as needed (pain).     Omega-3 Fatty Acids (FISH OIL) 1000 MG CAPS Take 1,000 mg by mouth daily.     Oxycodone HCl 10 MG TABS Take 10 mg by mouth 2 (two) times daily as needed (pain).     pantoprazole (PROTONIX) 40 MG tablet Take 40 mg by mouth See admin instructions. Take 40 mg daily, may take a second 40 mg dose as needed for heartburn     PEMBROLIZUMAB IV Inject into the vein every 21 ( twenty-one) days.     polyethylene glycol powder (GLYCOLAX/MIRALAX) 17 GM/SCOOP powder Take 17 g by mouth daily as needed for mild constipation or moderate constipation.     potassium chloride SA (KLOR-CON M) 20 MEQ tablet Take 1 tablet (20 mEq total) by mouth daily. 30 tablet 3   rosuvastatin (CRESTOR) 40 MG tablet Take 40 mg by mouth at bedtime.     triamcinolone cream (KENALOG) 0.1 % Apply 1 Application topically daily as needed (tick bite/itching).     triazolam (HALCION) 0.25 MG tablet Take 0.25 mg by mouth at bedtime.     vitamin B-12 (CYANOCOBALAMIN) 1000 MCG tablet Take 1,000 mcg by  mouth every other day.     lidocaine-prilocaine (EMLA) cream Apply a small amount to port a cath site and cover with plastic wrap 1 hour prior to infusion appointments (Patient not taking: Reported on 10/28/2022) 30 g 3    prochlorperazine (COMPAZINE) 10 MG tablet Take 1 tablet (10 mg total) by mouth every 6 (six) hours as needed for nausea or vomiting. (Patient not taking: Reported on 10/28/2022) 30 tablet 1   No current facility-administered medications for this visit.   Facility-Administered Medications Ordered in Other Visits  Medication Dose Route Frequency Provider Last Rate Last Admin   sodium chloride flush (NS) 0.9 % injection 10 mL  10 mL Intracatheter PRN Derek Jack, MD   10 mL at 10/07/22 1523    ALLERGIES:  Allergies  Allergen Reactions   Fentanyl Other (See Comments)    CAUSED HALLUCINATIONS/05-03-13 pt reports it was oral fentanyl that caused hallucinations    Gabapentin Other (See Comments)    Bones throbbed, headaches, weakness   Nabumetone     Unknown reaction    Aspirin Nausea Only    PHYSICAL EXAM:    Performance status (ECOG): 1 - Symptomatic but completely ambulatory  There were no vitals filed for this visit.  Wt Readings from Last 3 Encounters:  10/28/22 136 lb 6.4 oz (61.9 kg)  10/07/22 135 lb 8 oz (61.5 kg)  09/30/22 134 lb 9.6 oz (61.1 kg)   Physical Exam Constitutional:      Appearance: Normal appearance.  HENT:     Head: Normocephalic and atraumatic.     Mouth/Throat:     Mouth: Mucous membranes are moist.  Eyes:     Extraocular Movements: Extraocular movements intact.     Pupils: Pupils are equal, round, and reactive to light.  Cardiovascular:     Rate and Rhythm: Normal rate and regular rhythm.     Pulses: Normal pulses.     Heart sounds: Normal heart sounds.  Pulmonary:     Effort: Pulmonary effort is normal.     Breath sounds: Decreased breath sounds present.     Comments: Coarse breath sounds and decreased air movement in all lung fields. Abdominal:     General: Bowel sounds are normal.     Palpations: Abdomen is soft.     Tenderness: There is no abdominal tenderness.  Musculoskeletal:        General: No swelling.     Right lower leg: No  edema.     Left lower leg: No edema.  Lymphadenopathy:     Cervical: No cervical adenopathy.  Skin:    General: Skin is warm and dry.  Neurological:     General: No focal deficit present.     Mental Status: He is alert and oriented to person, place, and time.  Psychiatric:        Mood and Affect: Mood normal.        Behavior: Behavior normal.      LABORATORY DATA:  I have reviewed the labs as listed.     Latest Ref Rng & Units 10/28/2022   11:25 AM 10/07/2022   12:17 PM 09/30/2022    8:18 AM  CBC  WBC 4.0 - 10.5 K/uL 8.3  13.6  8.5   Hemoglobin 13.0 - 17.0 g/dL 12.0  13.1  12.2   Hematocrit 39.0 - 52.0 % 36.3  39.6  36.5   Platelets 150 - 400 K/uL 284  300  245       Latest Ref Rng &  Units 10/28/2022   11:25 AM 10/07/2022   12:17 PM 09/30/2022    8:18 AM  CMP  Glucose 70 - 99 mg/dL 109  104  109   BUN 8 - 23 mg/dL 9  22  13    Creatinine 0.61 - 1.24 mg/dL 0.77  0.92  0.90   Sodium 135 - 145 mmol/L 133  133  134   Potassium 3.5 - 5.1 mmol/L 3.8  3.3  3.2   Chloride 98 - 111 mmol/L 100  99  100   CO2 22 - 32 mmol/L 25  25  23    Calcium 8.9 - 10.3 mg/dL 8.3  8.5  8.3   Total Protein 6.5 - 8.1 g/dL 6.2  6.6  6.6   Total Bilirubin 0.3 - 1.2 mg/dL 0.4  0.4  0.4   Alkaline Phos 38 - 126 U/L 99  116  160   AST 15 - 41 U/L 18  21  22    ALT 0 - 44 U/L 17  36  46     DIAGNOSTIC IMAGING:  I have independently reviewed the scans and discussed with the patient. NM PET Image Restag (PS) Skull Base To Thigh  Result Date: 10/24/2022 CLINICAL DATA:  Subsequent treatment strategy for lung cancer. EXAM: NUCLEAR MEDICINE PET SKULL BASE TO THIGH TECHNIQUE: 7.2 mCi F-18 FDG was injected intravenously. Full-ring PET imaging was performed from the skull base to thigh after the radiotracer. CT data was obtained and used for attenuation correction and anatomic localization. Fasting blood glucose: 113 mg/dl COMPARISON:  CT chest dated 05/30/2022. MRI abdomen dated 05/16/2022. PET-CT dated  04/24/2022. FINDINGS: Mediastinal blood pool activity: SUV max 1.7 Liver activity: SUV max NA NECK: No hypermetabolic cervical lymphadenopathy. Incidental CT findings: None. CHEST: No hypermetabolic thoracic lymphadenopathy. Status post right upper lobectomy. No suspicious pulmonary nodules. Mild scarring in the superior segment left lower lobe. Right chest port terminates at the cavoatrial junction. Incidental CT findings: Moderate centrilobular and paraseptal emphysematous changes, upper lung predominant. Atherosclerotic calcifications of the aortic arch. Moderate three-vessel coronary atherosclerosis. ABDOMEN/PELVIS: No abnormal hypermetabolism in the liver, spleen, pancreas, or adrenal glands. No hypermetabolic abdominopelvic lymphadenopathy. Incidental CT findings: Moderate hiatal hernia. Layering gallstones (series 3/image 154), without associated inflammatory changes. Mildly complex left renal lesion measuring 1.8 cm (series 3/image 148), better evaluated on prior MRI, benign (Bosniak II). Atherosclerotic calcifications the abdominal aorta and branch vessels. SKELETON: Fragmentation of the inferior right scapular tip (series 3/image 92), with associated mild hypermetabolism, max SUV 3.9. This is likely related to pathologic fracture of the prior metastasis. Otherwise, no focal hypermetabolism to suggest skeletal metastasis. Incidental CT findings: Lumbar spine fixation hardware. IMPRESSION: Status post right upper lobectomy. Suspected pathologic fracture at the site of prior right inferior scapular metastasis. No evidence of new/progressive metastatic disease. Aortic Atherosclerosis (ICD10-I70.0) and Emphysema (ICD10-J43.9). Electronically Signed   By: Julian Hy M.D.   On: 10/24/2022 09:27     ASSESSMENT:  1.  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 - Status post right upper lobectomy in February 2012 for right lung  cancer. - LDCT chest (04/14/2022): Enlarging spiculated nodular soft tissue thickening in the posterior left upper lobe measuring 11.4 mm. - PET scan (04/24/2022): Left upper lobe lung nodule 14 mm with SUV 3.62.  Hypermetabolic focus in the inferior aspect of the right scapula without discrete CT correlate with SUV 5.71.  No hypermetabolic adenopathy. - LUL lesion FNA (06/03/2022): Adenocarcinoma -  Right scapula biopsy (07/03/2022) adenocarcinoma consistent with lung primary - NGS: PD-L1 22 C3 TPS 100%, T p53 pathogenic variant, MSI-stable, TMB-low, no other targetable mutations - Cycle 1 of pembrolizumab started on 07/21/2022 - XRT to the right scapular lesion and left upper lobe lung lesion 5 fractions completed on 08/18/2022.  2.  Cystic lesion of left kidney - CTA PE on 04/14/2022 showed complex cystic lesion of the left kidney.  PET scan also showed exophytic 18 mm left renal lesion suspicious for malignancy. - MRI abdomen on 05/16/2022 showed benign appearing Bosniak category 1 and 2 left renal cyst with no evidence of neoplasm.  No further work-up needed.  PLAN:   1.  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 - PET scan (10/23/2022): Suspected pathologic fracture at the site of right inferior scapular metastasis with no evidence of new or progressive disease. - Reviewed labs today which shows normal LFTs.  Creatinine is normal.  CBC was grossly normal.  Last TSH is 3.1. - He does not have any immunotherapy related side effects.  Proceed with treatment today.  RTC 3 weeks for follow-up.   2.  Left posterior chest wall rash: -He has erythematous maculopapular rash on the sides of his chest bilaterally. - Differential diagnosis includes immunotherapy related drug rash potentiated by radiation. - He is taking Atarax 1 to 2 tablets daily which is helping with itching.  He is also applying betamethasone cream as needed. - We  will send him for repeat dermatology evaluation.   3.  B12 deficiency -Continue B12 every other day and iron tablet twice daily.  4.  Chronic back pain after 20+ years duration: -Continue narcotics per Dr. Nolon Rod office.  5.  Hypokalemia: -Continue potassium 20 mEq daily.  Potassium today is normal.     All questions were answered. The patient knows to call the clinic with any problems, questions or concerns.    Derek Jack, MD

## 2022-10-28 NOTE — Patient Instructions (Addendum)
[place] at [facility] Discharge Instructions   You were seen and examined today by Dr. Delton Coombes.  He reviewed the results of your PET scan which was normal. There was no evidence of cancer seen on this scan.   He reviewed the results of your lab work which are normal/stable.   We will send you to the dermatologist to evaluate you for the rash you have developed since starting immunotherapy.   We will proceed with your treatment today.   Return as scheduled.      Thank you for choosing [facility] at [facility] to provide your oncology and hematology care.  To afford each patient quality time with our provider, please arrive at least 15 minutes before your scheduled appointment time.   If you have a lab appointment with the [facility] please come in thru the Main Entrance and check in at the main information desk.  You need to re-schedule your appointment should you arrive 10 or more minutes late.  We strive to give you quality time with our providers, and arriving late affects you and other patients whose appointments are after yours.  Also, if you no show three or more times for appointments you may be dismissed from the clinic at the providers discretion.     Again, thank you for choosing [facility].  Our hope is that these requests will decrease the amount of time that you wait before being seen by our physicians.       _____________________________________________________________  Should you have questions after your visit to [facility], please contact our office at [phone] and follow the prompts.  Our office hours are 8:00 a.m. and 4:30 p.m. Monday - Friday.  Please note that voicemails left after 4:00 p.m. may not be returned until the following business day.  We are closed weekends and major holidays.  You do have access to a nurse 24-7, just call the main number to the clinic [phone] and  do not press any options, hold on the line and a nurse will answer the phone.    For prescription refill requests, have your pharmacy contact our office and allow 72 hours.    Due to Covid, you will need to wear a mask upon entering the hospital. If you do not have a mask, a mask will be given to you at the Main Entrance upon arrival. For doctor visits, patients may have 1 support person age 73 or older with them. For treatment visits, patients can not have anyone with them due to social distancing guidelines and our immunocompromised population.

## 2022-10-28 NOTE — Progress Notes (Signed)
Patient has been examined by Dr. Katragadda, and vital signs and labs have been reviewed. ANC, Creatinine, LFTs, hemoglobin, and platelets are within treatment parameters per M.D. - pt may proceed with treatment.  Primary RN and pharmacy notified.  

## 2022-10-28 NOTE — Patient Instructions (Signed)
Maurice Orozco  Discharge Instructions: Thank you for choosing [facility] to provide your oncology and hematology care.  If you have a lab appointment with the [facility], please come in thru the Main Entrance and check in at the main information desk.  Wear comfortable clothing and clothing appropriate for easy access to any Portacath or PICC line.   We strive to give you quality time with your provider. You may need to reschedule your appointment if you arrive late (15 or more minutes).  Arriving late affects you and other patients whose appointments are after yours.  Also, if you miss three or more appointments without notifying the office, you may be dismissed from the clinic at the provider's discretion.      For prescription refill requests, have your pharmacy contact our office and allow 72 hours for refills to be completed.    Today you received the following chemotherapy and/or immunotherapy agents Keytruda      To help prevent nausea and vomiting after your treatment, we encourage you to take your nausea medication as directed.  BELOW ARE SYMPTOMS THAT SHOULD BE REPORTED IMMEDIATELY: *FEVER GREATER THAN 100.4 F (38 C) OR HIGHER *CHILLS OR SWEATING *NAUSEA AND VOMITING THAT IS NOT CONTROLLED WITH YOUR NAUSEA MEDICATION *UNUSUAL SHORTNESS OF BREATH *UNUSUAL BRUISING OR BLEEDING *URINARY PROBLEMS (pain or burning when urinating, or frequent urination) *BOWEL PROBLEMS (unusual diarrhea, constipation, pain near the anus) TENDERNESS IN MOUTH AND THROAT WITH OR WITHOUT PRESENCE OF ULCERS (sore throat, sores in mouth, or a toothache) UNUSUAL RASH, SWELLING OR PAIN  UNUSUAL VAGINAL DISCHARGE OR ITCHING   Items with * indicate a potential emergency and should be followed up as soon as possible or go to the Emergency Department if any problems should occur.  Please show the CHEMOTHERAPY ALERT CARD or IMMUNOTHERAPY ALERT CARD at check-in to the  Emergency Department and triage nurse.  Should you have questions after your visit or need to cancel or reschedule your appointment, please contact [facility] [phone]  and follow the prompts.  Office hours are 8:00 a.m. to 4:30 p.m. Monday - Friday. Please note that voicemails left after 4:00 p.m. may not be returned until the following business day.  We are closed weekends and major holidays. You have access to a nurse at all times for urgent questions. Please call the main number to the clinic [phone] and follow the prompts.  For any non-urgent questions, you may also contact your provider using MyChart. We now offer e-Visits for anyone 21 and older to request care online for non-urgent symptoms. For details visit [email or web address].   Also download the MyChart app! Go to the app store, search "MyChart", open the app, select [facility], and log in with your MyChart username and password.

## 2022-10-29 ENCOUNTER — Other Ambulatory Visit: Payer: Self-pay

## 2022-10-30 ENCOUNTER — Inpatient Hospital Stay: Payer: Medicare Other

## 2022-10-30 DIAGNOSIS — L308 Other specified dermatitis: Secondary | ICD-10-CM | POA: Diagnosis not present

## 2022-11-03 ENCOUNTER — Other Ambulatory Visit: Payer: Self-pay

## 2022-11-04 ENCOUNTER — Ambulatory Visit: Payer: Medicare Other

## 2022-11-04 ENCOUNTER — Ambulatory Visit: Payer: Medicare Other | Admitting: Hematology

## 2022-11-04 ENCOUNTER — Other Ambulatory Visit: Payer: Medicare Other

## 2022-11-18 ENCOUNTER — Other Ambulatory Visit: Payer: Self-pay | Admitting: Hematology

## 2022-11-18 DIAGNOSIS — Z95828 Presence of other vascular implants and grafts: Secondary | ICD-10-CM

## 2022-11-18 DIAGNOSIS — C3412 Malignant neoplasm of upper lobe, left bronchus or lung: Secondary | ICD-10-CM

## 2022-11-19 ENCOUNTER — Inpatient Hospital Stay (HOSPITAL_BASED_OUTPATIENT_CLINIC_OR_DEPARTMENT_OTHER): Payer: Medicare Other | Admitting: Hematology

## 2022-11-19 ENCOUNTER — Inpatient Hospital Stay: Payer: Medicare Other | Attending: Physician Assistant

## 2022-11-19 ENCOUNTER — Inpatient Hospital Stay: Payer: Medicare Other

## 2022-11-19 VITALS — BP 115/70 | HR 80 | Temp 97.1°F | Resp 18

## 2022-11-19 DIAGNOSIS — E876 Hypokalemia: Secondary | ICD-10-CM | POA: Insufficient documentation

## 2022-11-19 DIAGNOSIS — Z5112 Encounter for antineoplastic immunotherapy: Secondary | ICD-10-CM | POA: Insufficient documentation

## 2022-11-19 DIAGNOSIS — E538 Deficiency of other specified B group vitamins: Secondary | ICD-10-CM | POA: Insufficient documentation

## 2022-11-19 DIAGNOSIS — Z95828 Presence of other vascular implants and grafts: Secondary | ICD-10-CM

## 2022-11-19 DIAGNOSIS — C3412 Malignant neoplasm of upper lobe, left bronchus or lung: Secondary | ICD-10-CM

## 2022-11-19 DIAGNOSIS — C7951 Secondary malignant neoplasm of bone: Secondary | ICD-10-CM | POA: Insufficient documentation

## 2022-11-19 DIAGNOSIS — Z79620 Long term (current) use of immunosuppressive biologic: Secondary | ICD-10-CM | POA: Diagnosis not present

## 2022-11-19 LAB — CBC WITH DIFFERENTIAL/PLATELET
Abs Immature Granulocytes: 0.04 10*3/uL (ref 0.00–0.07)
Basophils Absolute: 0.1 10*3/uL (ref 0.0–0.1)
Basophils Relative: 1 %
Eosinophils Absolute: 0.4 10*3/uL (ref 0.0–0.5)
Eosinophils Relative: 5 %
HCT: 38.8 % — ABNORMAL LOW (ref 39.0–52.0)
Hemoglobin: 13.3 g/dL (ref 13.0–17.0)
Immature Granulocytes: 0 %
Lymphocytes Relative: 11 %
Lymphs Abs: 1 10*3/uL (ref 0.7–4.0)
MCH: 32.9 pg (ref 26.0–34.0)
MCHC: 34.3 g/dL (ref 30.0–36.0)
MCV: 96 fL (ref 80.0–100.0)
Monocytes Absolute: 0.7 10*3/uL (ref 0.1–1.0)
Monocytes Relative: 8 %
Neutro Abs: 7 10*3/uL (ref 1.7–7.7)
Neutrophils Relative %: 75 %
Platelets: 220 10*3/uL (ref 150–400)
RBC: 4.04 MIL/uL — ABNORMAL LOW (ref 4.22–5.81)
RDW: 12.8 % (ref 11.5–15.5)
WBC: 9.2 10*3/uL (ref 4.0–10.5)
nRBC: 0 % (ref 0.0–0.2)

## 2022-11-19 LAB — COMPREHENSIVE METABOLIC PANEL
ALT: 18 U/L (ref 0–44)
AST: 18 U/L (ref 15–41)
Albumin: 3.7 g/dL (ref 3.5–5.0)
Alkaline Phosphatase: 93 U/L (ref 38–126)
Anion gap: 12 (ref 5–15)
BUN: 19 mg/dL (ref 8–23)
CO2: 23 mmol/L (ref 22–32)
Calcium: 8.4 mg/dL — ABNORMAL LOW (ref 8.9–10.3)
Chloride: 98 mmol/L (ref 98–111)
Creatinine, Ser: 1.06 mg/dL (ref 0.61–1.24)
GFR, Estimated: >60 mL/min (ref >60)
Glucose, Bld: 100 mg/dL — ABNORMAL HIGH (ref 70–99)
Potassium: 2.7 mmol/L — CL (ref 3.5–5.1)
Sodium: 133 mmol/L — ABNORMAL LOW (ref 135–145)
Total Bilirubin: 0.7 mg/dL (ref 0.3–1.2)
Total Protein: 6.8 g/dL (ref 6.5–8.1)

## 2022-11-19 LAB — TSH: TSH: 3.323 u[IU]/mL (ref 0.350–4.500)

## 2022-11-19 LAB — MAGNESIUM: Magnesium: 1.6 mg/dL — ABNORMAL LOW (ref 1.7–2.4)

## 2022-11-19 MED ORDER — SODIUM CHLORIDE 0.9% FLUSH
10.0000 mL | INTRAVENOUS | Status: AC | PRN
  Administered 2022-11-19: 10 mL

## 2022-11-19 MED ORDER — SODIUM CHLORIDE 0.9 % IV SOLN
200.0000 mg | Freq: Once | INTRAVENOUS | Status: AC
  Administered 2022-11-19: 200 mg via INTRAVENOUS
  Filled 2022-11-19: qty 8

## 2022-11-19 MED ORDER — SODIUM CHLORIDE 0.9 % IV SOLN: Freq: Once | INTRAVENOUS | Status: AC

## 2022-11-19 MED ORDER — MAGNESIUM SULFATE 2 GM/50ML IV SOLN
2.0000 g | Freq: Once | INTRAVENOUS | Status: AC
  Administered 2022-11-19: 2 g via INTRAVENOUS
  Filled 2022-11-19: qty 50

## 2022-11-19 MED ORDER — HEPARIN SOD (PORK) LOCK FLUSH 100 UNIT/ML IV SOLN
500.0000 [IU] | Freq: Once | INTRAVENOUS | Status: AC | PRN
  Administered 2022-11-19: 500 [IU]

## 2022-11-19 MED ORDER — POTASSIUM CHLORIDE 10 MEQ/100ML IV SOLN
10.0000 meq | INTRAVENOUS | Status: AC
  Administered 2022-11-19 (×2): 10 meq via INTRAVENOUS
  Filled 2022-11-19 (×2): qty 100

## 2022-11-19 MED ORDER — TAMSULOSIN HCL 0.4 MG PO CAPS: 0.4000 mg | ORAL_CAPSULE | Freq: Every day | ORAL | 3 refills | Status: DC

## 2022-11-19 MED ORDER — POTASSIUM CHLORIDE CRYS ER 20 MEQ PO TBCR
40.0000 meq | EXTENDED_RELEASE_TABLET | ORAL | Status: AC
  Administered 2022-11-19 (×2): 40 meq via ORAL
  Filled 2022-11-19 (×2): qty 2

## 2022-11-19 MED ORDER — SODIUM CHLORIDE 0.9% FLUSH
10.0000 mL | INTRAVENOUS | Status: DC | PRN
  Administered 2022-11-19: 10 mL

## 2022-11-19 NOTE — Progress Notes (Signed)
Patient presents today for treatment / Keytruda an f/u visit with Dr. Delton Coombes. Potassium 2.7 today. Message received to proceed with treatment today. Standing orders used for 2.7 potassium.

## 2022-11-19 NOTE — Addendum Note (Signed)
Addended by: Henreitta Leber E on: 11/19/2022 12:00 PM   Modules accepted: Orders

## 2022-11-19 NOTE — Progress Notes (Signed)
[facility] [address], [place] [address]    Clinic Day:  11/19/2022  Referring physician: Redmond School, MD  Patient Care Team: Redmond School, MD as PCP - General (Internal Medicine) Leonie Man, MD as PCP - Cardiology (Cardiology) Rogene Houston, MD as Consulting Physician (Gastroenterology) Eppie Gibson, MD as Attending Physician (Radiation Oncology)   ASSESSMENT & PLAN:   Assessment:  1.  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 - Status post right upper lobectomy in February 2012 for right lung cancer. - LDCT chest (04/14/2022): Enlarging spiculated nodular soft tissue thickening in the posterior left upper lobe measuring 11.4 mm. - PET scan (04/24/2022): Left upper lobe lung nodule 14 mm with SUV 3.62.  Hypermetabolic focus in the inferior aspect of the right scapula without discrete CT correlate with SUV 5.71.  No hypermetabolic adenopathy. - LUL lesion FNA (06/03/2022): Adenocarcinoma - Right scapula biopsy (07/03/2022) adenocarcinoma consistent with lung primary - NGS: PD-L1 22 C3 TPS 100%, T p53 pathogenic variant, MSI-stable, TMB-low, no other targetable mutations - Cycle 1 of pembrolizumab started on 07/21/2022 - XRT to the right scapular lesion and left upper lobe lung lesion 5 fractions completed on 08/18/2022.   2.  Cystic lesion of left kidney - CTA PE on 04/14/2022 showed complex cystic lesion of the left kidney.  PET scan also showed exophytic 18 mm left renal lesion suspicious for malignancy. - MRI abdomen on 05/16/2022 showed benign appearing Bosniak category 1 and 2 left renal cyst with no evidence of neoplasm.  No further work-up needed.  Plan: 1.  Metastatic adenocarcinoma of the left upper lobe of the lung to the scapula: - PET scan on 10/23/2022: Suspected pathological fracture at the site of the right inferior scapular metastasis with no evidence of new  or progressive disease. - Maurice Orozco has tolerated last cycle reasonably well.  Denied any immunotherapy related side effects.  Reports difficulty urinating for the past couple of weeks.  Wonder if this is coming from Atarax. - I will start Maurice Orozco on Flomax 0.4 mg daily. - Reviewed his LFTs which are normal.  Creatinine is normal.  CBC was grossly normal.  TSH is 3.3. - Proceed with next cycle of Keytruda today.  RTC 3 weeks for follow-up.   2.  Left posterior chest wall rash: - Maurice Orozco has erythematous maculopapular rash on the sides of his chest wall bilaterally.  Also has some scratch marks in the lower back. - Maurice Orozco was evaluated by dermatology.  Will obtain their notes and review them.  Reportedly his Atarax dose was increased.  Continue betamethasone cream twice daily.   3.  B12 deficiency - Continue B12 every other day and iron tablet daily.   4.  Chronic back pain after 20+ years duration: - Continue narcotics per Dr. Nolon Rod office.   5.  Hypokalemia: - Continue potassium 20 mEq daily.  Potassium today will be repleted as it was low at 2.7.  Orders Placed This Encounter  Procedures   Magnesium    Standing Status:   Future    Standing Expiration Date:   12/10/2023   CBC with Differential    Standing Status:   Future    Standing Expiration Date:   12/10/2023   Comprehensive metabolic panel    Standing Status:   Future    Standing Expiration Date:   12/10/2023   Magnesium    Standing Status:   Future  Standing Expiration Date:   12/31/2023   CBC with Differential    Standing Status:   Future    Standing Expiration Date:   12/31/2023   Comprehensive metabolic panel    Standing Status:   Future    Standing Expiration Date:   12/31/2023   Magnesium    Standing Status:   Future    Standing Expiration Date:   01/21/2024   CBC with Differential    Standing Status:   Future    Standing Expiration Date:   01/21/2024   Comprehensive metabolic panel    Standing Status:   Future    Standing Expiration  Date:   01/21/2024   TSH    Standing Status:   Future    Standing Expiration Date:   01/21/2024   Magnesium    Standing Status:   Future    Standing Expiration Date:   02/11/2024   CBC with Differential    Standing Status:   Future    Standing Expiration Date:   02/11/2024   Comprehensive metabolic panel    Standing Status:   Future    Standing Expiration Date:   02/11/2024      I,Alexis Herring,acting as a scribe for Derek Jack, MD.,have documented all relevant documentation on the behalf of Derek Jack, MD,as directed by  Derek Jack, MD while in the presence of Derek Jack, MD.   I, Derek Jack MD, have reviewed the above documentation for accuracy and completeness, and I agree with the above.   Derek Jack, MD   3/13/20246:49 PM  CHIEF COMPLAINT:   Diagnosis: bilateral lung cancers, normocytic anemia, and B12 deficiency    Cancer Staging  Adenocarcinoma of lung ([place]) Staging form: Lung, AJCC 7th Edition - Clinical: Stage IA (T1a, N0, M0) - Signed by Baird Cancer, PA on 05/06/2011  Adenocarcinoma of upper lobe of left lung ([place]) Staging form: Lung, AJCC 8th Edition - Clinical stage from 07/16/2022: Stage IVA (cT1b, cN0, pM1b) - Unsigned    Prior Therapy:  - Right upper lobectomy (February 2012) for right lung cancer - SBRT (November 2015) for stage I left adenocarcinoma  Current Therapy:  Pembrolizumab    HISTORY OF PRESENT ILLNESS:   Oncology History  Adenocarcinoma of lung ([place])  09/30/2010 Cancer Staging   CT of chest showed spiculated lesion   10/15/2010 Surgery   Right upper lobectomy by Dr. Arlyce Dice   10/16/2010 Remission     10/24/2013 Imaging   CT Chest-  Enlarging subpleural nodule in the left lower lobe, now 9 mm, with distortion of the overlying pleura. Finding is concerning for a small bronchogenic carcinoma   01/23/2014 Imaging   CT Chest- Left lower lobe nodule has enlarged from 11/01/2012 and is  worrisome for primary bronchogenic carcinoma.    07/04/2014 - 07/10/2014 Radiation Therapy   SBRT by Dr. Isidore Moos in 3 fractions.   07/05/2015 Imaging   perifissural nodule in LLL appears slightly smaller with parenchymal opacity attrib to XRT, likely inflamm pathcy airspace dz more inferiorly in LLL, airspace dz in RLL resolved. no metastatic disease   Primary cancer of left lower lobe of lung ([place])  06/18/2014 Initial Diagnosis   Left lower lobe nodule suspicious for primary lung carcinoma   07/04/2014 - 07/10/2014 Radiation Therapy   SBRT   10/12/2014 Imaging   No acute findings within the chest. Decrease in size of left lower lobe perifissural nodule compatible with response to therapy. No new or progressive disease identified within the chest.  Adenocarcinoma of upper lobe of left lung ([place])  07/16/2022 Initial Diagnosis   Adenocarcinoma of upper lobe of left lung ([place])   07/21/2022 -  Chemotherapy   Patient is on Treatment Plan : LUNG NSCLC Pembrolizumab (200) q21d        INTERVAL HISTORY:   Maurice Orozco is a 77 y.o. male presenting to clinic today for follow up of bilateral lung cancers, normocytic anemia, and B12 deficiency. Maurice Orozco was last seen by me on 10/28/22.  Today, Maurice Orozco states that Maurice Orozco is doing well overall. His appetite level is at 90%. His energy level is at 50%. Maurice Orozco denies any diarrhea, dry cough or shortness of breath.   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 dense  RCA calcification --Non-ischemic Myoview   History of kidney stones    in the 70's   History of radiation therapy 07/04/14, 07/06/14, 07/10/14   LLL lung nodule/54 Gy/3 fx- SBRT   Hypercholesterolemia 05/06/2011   Pneumonia due to Streptococcus    HX. POST OPERATIVELY   Port-A-Cath in place 07/21/2022   Pre-diabetes    Stress fx pelvis    DUE TO AA IN 1970    Surgical History: Past Surgical History:  Procedure Laterality Date   BACK SURGERY  07/04/2017   fusion above previous surgery   BACK SURGERY  2011   metal plate l-4,L5   BRONCHIAL BIOPSY  06/03/2022   Procedure: BRONCHIAL BIOPSIES;  Surgeon: Garner Nash, DO;  Location: [facility];  Service: Pulmonary;;   BRONCHIAL BRUSHINGS  06/03/2022   Procedure: BRONCHIAL BRUSHINGS;  Surgeon: Garner Nash, DO;  Location: [facility];  Service: Pulmonary;;   BRONCHIAL NEEDLE ASPIRATION BIOPSY  06/03/2022   Procedure: BRONCHIAL NEEDLE ASPIRATION BIOPSIES;  Surgeon: Garner Nash, DO;  Location: [facility];  Service: Pulmonary;;   Coroanry Calcium Score  03/2017   1043. Noted especially in RCA. Dense calcification, recommend Myoview over Cor CTA.   FIDUCIAL MARKER PLACEMENT  06/03/2022   Procedure: FIDUCIAL MARKER PLACEMENT;  Surgeon: Garner Nash, DO;  Location: [facility];  Service: Pulmonary;;   IR IMAGING GUIDED PORT INSERTION  08/04/2022   LIPOMA EXCISION N/A 10/14/2017   Procedure: EXCISION LIPOMA ON BACK;  Surgeon: Virl Cagey, MD;  Location: [facility];  Service: General;  Laterality: N/A;   LIPOMA EXCISION Left 11/19/2018   Procedure: MINOR EXCISION OF SEBACEOUS CYST NECK;  Surgeon: Virl Cagey, MD;  Location: [facility];  Service: General;  Laterality: Left;  pt knows to arrive at 7:00   LUNG LOBECTOMY  2012   RT [place]  04/2017   EF 60%. Hypertensive response to exercise. (6:21 min, 7.7 METs) No EKG changes. LOW RISK    Social History: Social History   Socioeconomic History    Marital status: Married    Spouse name: Not on file  Number of children: 3   Years of education: Not on file   Highest education level: Not on file  Occupational History   Not on file  Tobacco Use   Smoking status: Former    Packs/day: 1.50    Years: 59.00    Total pack years: 88.50    Types: Cigarettes    Quit date: 09/08/2010    Years since quitting: 12.2   Smokeless tobacco: Current    Types: Chew   Tobacco comments:    still chews tobacco, Maurice Orozco tells me Maurice Orozco hasn't in the past 2 days.   Vaping Use   Vaping Use: Never used  Substance and Sexual Activity   Alcohol use: No   Drug use: No   Sexual activity: Not Currently  Other Topics Concern   Not on file  Social History Narrative   Not on file   Social Determinants of Health   Financial Resource Strain: Not on file  Food Insecurity: Not on file  Transportation Needs: No Transportation Needs (07/13/2018)   PRAPARE - Hydrologist (Medical): No    Lack of Transportation (Non-Medical): No  Physical Activity: Not on file  Stress: Not on file  Social Connections: Not on file  Intimate Partner Violence: Not At Risk (07/13/2018)   Humiliation, Afraid, Rape, and Kick questionnaire    Fear of Current or Ex-Partner: No    Emotionally Abused: No    Physically Abused: No    Sexually Abused: No    Family History: Family History  Problem Relation Age of Onset   Hypertension Mother    Kidney disease Father    Heart attack Brother    Cancer Maternal Uncle        prostate cancer   Cancer Maternal Uncle        prostate cancer   Cancer Maternal Uncle        prostate cancer   Cancer Maternal Uncle        prostate cancer    Current Medications:  Current Outpatient Medications:    albuterol (PROVENTIL HFA;VENTOLIN HFA) 108 (90 Base) MCG/ACT inhaler, Inhale 2 puffs into the lungs every 4 (four) hours as needed for wheezing or shortness of breath., Disp: 2 Inhaler, Rfl: 3   albuterol (PROVENTIL) (2.5  MG/3ML) 0.083% nebulizer solution, Take 2.5 mg by nebulization every 6 (six) hours as needed for wheezing or shortness of breath., Disp: , Rfl:    betamethasone dipropionate 0.05 % cream, Apply topically 2 (two) times daily., Disp: 30 g, Rfl: 1   cholecalciferol (VITAMIN D3) 25 MCG (1000 UNIT) tablet, Take 1,000 Units by mouth daily., Disp: , Rfl:    cimetidine (TAGAMET) 200 MG tablet, Take 400-600 mg by mouth daily as needed (for heartburn)., Disp: , Rfl:    clobetasol ointment (TEMOVATE) 0.05 %, Apply topically 2 (two) times daily as needed., Disp: , Rfl:    ferrous sulfate 325 (65 FE) MG tablet, Take 325 mg by mouth daily with breakfast., Disp: , Rfl:    fluticasone (FLONASE) 50 MCG/ACT nasal spray, Place 2 sprays into both nostrils daily as needed for rhinitis., Disp: , Rfl:    guaiFENesin (MUCINEX) 600 MG 12 hr tablet, Take 600 mg by mouth daily., Disp: , Rfl:    hydrochlorothiazide (HYDRODIURIL) 25 MG tablet, Take 12.5 mg by mouth daily., Disp: , Rfl:    HYDROcodone-acetaminophen (NORCO) 10-325 MG tablet, Take 1-2 tablets by mouth every 4 (four) hours as needed for moderate pain., Disp: [id number]  tablet, Rfl: 0   hydrOXYzine (VISTARIL) 25 MG capsule, Take 25-50 mg by mouth at bedtime as needed., Disp: , Rfl:    Menthol, Topical Analgesic, (ICY HOT EX), Apply 1 Application topically daily as needed (pain)., Disp: , Rfl:    naproxen sodium (ALEVE) 220 MG tablet, Take 220 mg by mouth daily as needed (pain)., Disp: , Rfl:    Omega-3 Fatty Acids (FISH OIL) 1000 MG CAPS, Take 1,000 mg by mouth daily., Disp: , Rfl:    Oxycodone HCl 10 MG TABS, Take 10 mg by mouth 2 (two) times daily as needed (pain)., Disp: , Rfl:    pantoprazole (PROTONIX) 40 MG tablet, Take 40 mg by mouth See admin instructions. Take 40 mg daily, may take a second 40 mg dose as needed for heartburn, Disp: , Rfl:    PEMBROLIZUMAB IV, Inject into the vein every 21 ( twenty-one) days., Disp: , Rfl:    polyethylene glycol powder  (GLYCOLAX/MIRALAX) 17 GM/SCOOP powder, Take 17 g by mouth daily as needed for mild constipation or moderate constipation., Disp: , Rfl:    potassium chloride SA (KLOR-CON M) 20 MEQ tablet, Take 1 tablet (20 mEq total) by mouth daily., Disp: 30 tablet, Rfl: 3   rosuvastatin (CRESTOR) 40 MG tablet, Take 40 mg by mouth at bedtime., Disp: , Rfl:    tamsulosin (FLOMAX) 0.4 MG CAPS capsule, Take 1 capsule (0.4 mg total) by mouth daily., Disp: 30 capsule, Rfl: 3   triamcinolone cream (KENALOG) 0.1 %, Apply 1 Application topically daily as needed (tick bite/itching)., Disp: , Rfl:    triazolam (HALCION) 0.25 MG tablet, Take 0.25 mg by mouth at bedtime., Disp: , Rfl:    vitamin B-12 (CYANOCOBALAMIN) 1000 MCG tablet, Take 1,000 mcg by mouth every other day., Disp: , Rfl:    lidocaine-prilocaine (EMLA) cream, APPLY A SMALL AMOUNT TO PORT A CATH SITE AND COVER WITH PLASTIC WRAP 1 HOUR PRIOR TO INFUSION APPOINTMENTS (Patient not taking: Reported on 11/19/2022), Disp: 30 g, Rfl: 3   prochlorperazine (COMPAZINE) 10 MG tablet, Take 1 tablet (10 mg total) by mouth every 6 (six) hours as needed for nausea or vomiting. (Patient not taking: Reported on 11/19/2022), Disp: 30 tablet, Rfl: 1 No current facility-administered medications for this visit.  Facility-Administered Medications Ordered in Other Visits:    sodium chloride flush (NS) 0.9 % injection 10 mL, 10 mL, Intracatheter, PRN, Derek Jack, MD, 10 mL at 10/07/22 1523   sodium chloride flush (NS) 0.9 % injection 10 mL, 10 mL, Intracatheter, PRN, Derek Jack, MD, 10 mL at 10/28/22 1446   sodium chloride flush (NS) 0.9 % injection 10 mL, 10 mL, Intracatheter, PRN, Derek Jack, MD, 10 mL at 11/19/22 1547   Allergies: Allergies  Allergen Reactions   Fentanyl Other (See Comments)    CAUSED HALLUCINATIONS/05-03-13 pt reports it was oral fentanyl that caused hallucinations    Gabapentin Other (See Comments)    Bones throbbed, headaches,  weakness   Nabumetone     Unknown reaction    Aspirin Nausea Only    REVIEW OF SYSTEMS:   Review of Systems  Constitutional:  Negative for chills, fatigue and fever.  HENT:   Negative for lump/mass, mouth sores, nosebleeds, sore throat and trouble swallowing.   Eyes:  Negative for eye problems.  Respiratory:  Positive for shortness of breath. Negative for cough.   Cardiovascular:  Negative for chest pain, leg swelling and palpitations.  Gastrointestinal:  Negative for abdominal pain, constipation, diarrhea, nausea and vomiting.  Genitourinary:  Positive for difficulty urinating. Negative for bladder incontinence, dysuria, frequency, hematuria and nocturia.   Musculoskeletal:  Negative for arthralgias, back pain, flank pain, myalgias and neck pain.  Skin:  Positive for itching and rash.  Neurological:  Negative for dizziness, headaches and numbness.  Hematological:  Does not bruise/bleed easily.  Psychiatric/Behavioral:  Negative for depression, sleep disturbance and suicidal ideas. The patient is not nervous/anxious.   All other systems reviewed and are negative.    VITALS:   There were no vitals taken for this visit.  Wt Readings from Last 3 Encounters:  11/19/22 131 lb 9.6 oz (59.7 kg)  10/28/22 136 lb 6.4 oz (61.9 kg)  10/07/22 135 lb 8 oz (61.5 kg)    There is no height or weight on file to calculate BMI.  Performance status (ECOG): 1 - Symptomatic but completely ambulatory  PHYSICAL EXAM:   Physical Exam Vitals and nursing note reviewed. Exam conducted with a chaperone present.  Constitutional:      Appearance: Normal appearance.  Cardiovascular:     Rate and Rhythm: Normal rate and regular rhythm.     Pulses: Normal pulses.     Heart sounds: Normal heart sounds.  Pulmonary:     Effort: Pulmonary effort is normal.     Breath sounds: Normal breath sounds.  Abdominal:     Palpations: Abdomen is soft. There is no hepatomegaly, splenomegaly or mass.     Tenderness:  There is no abdominal tenderness.  Musculoskeletal:     Right lower leg: No edema.     Left lower leg: No edema.  Lymphadenopathy:     Cervical: No cervical adenopathy.     Right cervical: No superficial, deep or posterior cervical adenopathy.    Left cervical: No superficial, deep or posterior cervical adenopathy.     Upper Body:     Right upper body: No supraclavicular or axillary adenopathy.     Left upper body: No supraclavicular or axillary adenopathy.  Neurological:     General: No focal deficit present.     Mental Status: Maurice Orozco is alert and oriented to person, place, and time.  Psychiatric:        Mood and Affect: Mood normal.        Behavior: Behavior normal.     LABS:      Latest Ref Rng & Units 11/19/2022   10:39 AM 10/28/2022   11:25 AM 10/07/2022   12:17 PM  CBC  WBC 4.0 - 10.5 K/uL 9.2  8.3  13.6   Hemoglobin 13.0 - 17.0 g/dL 13.3  12.0  13.1   Hematocrit 39.0 - 52.0 % 38.8  36.3  39.6   Platelets 150 - 400 K/uL 220  284  300       Latest Ref Rng & Units 11/19/2022   10:39 AM 10/28/2022   11:25 AM 10/07/2022   12:17 PM  CMP  Glucose 70 - 99 mg/dL 100  109  104   BUN 8 - 23 mg/dL '19  9  22   '$ Creatinine 0.61 - 1.24 mg/dL 1.06  0.77  0.92   Sodium 135 - 145 mmol/L 133  133  133   Potassium 3.5 - 5.1 mmol/L 2.7  3.8  3.3   Chloride 98 - 111 mmol/L 98  100  99   CO2 22 - 32 mmol/L '23  25  25   '$ Calcium 8.9 - 10.3 mg/dL 8.4  8.3  8.5   Total Protein 6.5 - 8.1 g/dL  6.8  6.2  6.6   Total Bilirubin 0.3 - 1.2 mg/dL 0.7  0.4  0.4   Alkaline Phos 38 - 126 U/L 93  99  116   AST 15 - 41 U/L '18  18  21   '$ ALT 0 - 44 U/L 18  17  36      No results found for: "CEA1", "CEA" / No results found for: "CEA1", "CEA" No results found for: "PSA1" No results found for: "[id number]" No results found for: "CAN125"  No results found for: "TOTALPROTELP", "ALBUMINELP", "A1GS", "A2GS", "BETS", "BETA2SER", "GAMS", "MSPIKE", "SPEI" Lab Results  Component Value Date   TIBC 310 09/30/2022    TIBC 323 04/14/2022   TIBC 373 10/01/2021   FERRITIN 108 09/30/2022   FERRITIN 65 04/14/2022   FERRITIN 49 10/01/2021   IRONPCTSAT 13 (L) 09/30/2022   IRONPCTSAT 22 04/14/2022   IRONPCTSAT 27 10/01/2021   Lab Results  Component Value Date   LDH 120 06/28/2021   LDH 130 12/20/2020   LDH 97 (L) 07/12/2018     STUDIES:   NM PET Image Restag (PS) Skull Base To Thigh  Result Date: 10/24/2022 CLINICAL DATA:  Subsequent treatment strategy for lung cancer. EXAM: NUCLEAR MEDICINE PET SKULL BASE TO THIGH TECHNIQUE: 7.2 mCi F-18 FDG was injected intravenously. Full-ring PET imaging was performed from the skull base to thigh after the radiotracer. CT data was obtained and used for attenuation correction and anatomic localization. Fasting blood glucose: 113 mg/dl COMPARISON:  CT chest dated 05/30/2022. MRI abdomen dated 05/16/2022. PET-CT dated 04/24/2022. FINDINGS: Mediastinal blood pool activity: SUV max 1.7 Liver activity: SUV max NA NECK: No hypermetabolic cervical lymphadenopathy. Incidental CT findings: None. CHEST: No hypermetabolic thoracic lymphadenopathy. Status post right upper lobectomy. No suspicious pulmonary nodules. Mild scarring in the superior segment left lower lobe. Right chest port terminates at the cavoatrial junction. Incidental CT findings: Moderate centrilobular and paraseptal emphysematous changes, upper lung predominant. Atherosclerotic calcifications of the aortic arch. Moderate three-vessel coronary atherosclerosis. ABDOMEN/PELVIS: No abnormal hypermetabolism in the liver, spleen, pancreas, or adrenal glands. No hypermetabolic abdominopelvic lymphadenopathy. Incidental CT findings: Moderate hiatal hernia. Layering gallstones (series 3/image 154), without associated inflammatory changes. Mildly complex left renal lesion measuring 1.8 cm (series 3/image 148), better evaluated on prior MRI, benign (Bosniak II). Atherosclerotic calcifications the abdominal aorta and branch vessels.  SKELETON: Fragmentation of the inferior right scapular tip (series 3/image 92), with associated mild hypermetabolism, max SUV 3.9. This is likely related to pathologic fracture of the prior metastasis. Otherwise, no focal hypermetabolism to suggest skeletal metastasis. Incidental CT findings: Lumbar spine fixation hardware. IMPRESSION: Status post right upper lobectomy. Suspected pathologic fracture at the site of prior right inferior scapular metastasis. No evidence of new/progressive metastatic disease. Aortic Atherosclerosis (ICD10-I70.0) and Emphysema (ICD10-J43.9). Electronically Signed   By: Julian Hy M.D.   On: 10/24/2022 09:27

## 2022-11-19 NOTE — Addendum Note (Signed)
Addended by: Henreitta Leber E on: 11/19/2022 12:03 PM   Modules accepted: Orders

## 2022-11-19 NOTE — Addendum Note (Signed)
Addended by: Benjiman Core D on: 11/19/2022 12:20 PM   Modules accepted: Orders

## 2022-11-19 NOTE — Progress Notes (Signed)
CRITICAL VALUE ALERT Critical value received:  K+ 2.7 Date of notification:  11-19-22 Time of notification: 1138 Critical value read back:  Yes.   Nurse who received alert:  C. Naheem Mosco RN MD notified time and response:  1140 Dr. Delton Coombes. Will give additional potassium per orders

## 2022-11-19 NOTE — Patient Instructions (Addendum)
[place] at [facility] Discharge Instructions   You were seen and examined today by Dr. Delton Coombes.  Your lab results from today are pending at this time.   We will proceed with your treatment today.   We will prescribe a medication for you called Flomax. This is given to help with your difficulty urinating.   Continue Atarax and clobetasol cream as needed for rash and itching.   Return as scheduled.    Thank you for choosing [place] at [facility] to provide your oncology and hematology care.  To afford each patient quality time with our provider, please arrive at least 15 minutes before your scheduled appointment time.   If you have a lab appointment with the [facility] please come in thru the Main Entrance and check in at the main information desk.  You need to re-schedule your appointment should you arrive 10 or more minutes late.  We strive to give you quality time with our providers, and arriving late affects you and other patients whose appointments are after yours.  Also, if you no show three or more times for appointments you may be dismissed from the clinic at the providers discretion.     Again, thank you for choosing [facility].  Our hope is that these requests will decrease the amount of time that you wait before being seen by our physicians.       _____________________________________________________________  Should you have questions after your visit to [facility], please contact our office at [phone] and follow the prompts.  Our office hours are 8:00 a.m. and 4:30 p.m. Monday - Friday.  Please note that voicemails left after 4:00 p.m. may not be returned until the following business day.  We are closed weekends and major holidays.  You do have access to a nurse 24-7, just call the main number to the clinic [phone] and do not press any options, hold on the line and a nurse will  answer the phone.    For prescription refill requests, have your pharmacy contact our office and allow 72 hours.    Due to Covid, you will need to wear a mask upon entering the hospital. If you do not have a mask, a mask will be given to you at the Main Entrance upon arrival. For doctor visits, patients may have 1 support person age 21 or older with them. For treatment visits, patients can not have anyone with them due to social distancing guidelines and our immunocompromised population.

## 2022-11-19 NOTE — Progress Notes (Signed)
Magnesium 1.6 per standing order patient will receive 2 grams Magnesium Sulfate today after treatment.

## 2022-12-08 ENCOUNTER — Other Ambulatory Visit: Payer: Self-pay

## 2022-12-08 DIAGNOSIS — C3412 Malignant neoplasm of upper lobe, left bronchus or lung: Secondary | ICD-10-CM

## 2022-12-09 ENCOUNTER — Other Ambulatory Visit: Payer: Self-pay

## 2022-12-09 NOTE — Progress Notes (Signed)
[facility] [address] [place] [address]    Clinic Day:  12/10/2022  Referring physician: Redmond School, MD  Patient Care Team: Redmond School, MD as PCP - General (Internal Medicine) Leonie Man, MD as PCP - Cardiology (Cardiology) Rogene Houston, MD as Consulting Physician (Gastroenterology) Eppie Gibson, MD as Attending Physician (Radiation Oncology)   ASSESSMENT & PLAN:   Assessment: 1.  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 - Status post right upper lobectomy in February 2012 for right lung cancer. - LDCT chest (04/14/2022): Enlarging spiculated nodular soft tissue thickening in the posterior left upper lobe measuring 11.4 mm. - PET scan (04/24/2022): Left upper lobe lung nodule 14 mm with SUV 3.62.  Hypermetabolic focus in the inferior aspect of the right scapula without discrete CT correlate with SUV 5.71.  No hypermetabolic adenopathy. - LUL lesion FNA (06/03/2022): Adenocarcinoma - Right scapula biopsy (07/03/2022) adenocarcinoma consistent with lung primary - NGS: PD-L1 22 C3 TPS 100%, T p53 pathogenic variant, MSI-stable, TMB-low, no other targetable mutations - Cycle 1 of pembrolizumab started on 07/21/2022 - XRT to the right scapular lesion and left upper lobe lung lesion 5 fractions completed on 08/18/2022.   2.  Cystic lesion of left kidney - CTA PE on 04/14/2022 showed complex cystic lesion of the left kidney.  PET scan also showed exophytic 18 mm left renal lesion suspicious for malignancy. - MRI abdomen on 05/16/2022 showed benign appearing Bosniak category 1 and 2 left renal cyst with no evidence of neoplasm.  No further work-up needed.   Plan: 1.  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 - He does not have any immunotherapy related side effects other than skin rash. - Flomax is helping with his urination. - Labs today: LFTs normal.  Creatinine normal.  CBC was grossly normal.  TSH is 3.3. - Proceed with Keytruda today.  RTC 3 weeks for follow-up.   2.  Left posterior chest wall rash: - He has erythematous maculopapular rash on the right posterior chest wall, right anterior abdominal wall and left elbow.  Steroid cream is helping with itching for 30 minutes.  Atarax also helps slightly. - Recommend starting him on low-dose prednisone 5 mg daily for better control.   3.  B12 deficiency - Continue B12 every other day and iron tablet daily.   4.  Chronic back pain after 20+ years duration: - Continue narcotics per Dr. Nolon Rod office.   5.  Hypokalemia: - Continue potassium 20 mEq daily.  Potassium today is normal.  Orders Placed This Encounter  Procedures   Magnesium    Standing Status:   Future    Standing Expiration Date:   03/03/2024   CBC with Differential    Standing Status:   Future    Standing Expiration Date:   03/03/2024   Comprehensive metabolic panel    Standing Status:   Future    Standing Expiration Date:   03/03/2024   Magnesium    Standing Status:   Future    Standing Expiration Date:   03/24/2024   CBC with Differential    Standing Status:   Future    Standing Expiration Date:   03/24/2024   Comprehensive metabolic panel  Standing Status:   Future    Standing Expiration Date:   03/24/2024   TSH    Standing Status:   Future    Standing Expiration Date:   03/24/2024      I,Katie Daubenspeck,acting as a scribe for Derek Jack, MD.,have documented all relevant documentation on the behalf of Derek Jack, MD,as directed by  Derek Jack, MD while in the presence of Derek Jack, MD.   I, Derek Jack MD, have reviewed the above documentation for accuracy and completeness, and I agree with the  above.   Derek Jack, MD   4/3/20245:40 PM  CHIEF COMPLAINT:   Diagnosis: bilateral lung cancers, normocytic anemia, and B12 deficiency    Cancer Staging  Adenocarcinoma of lung Staging form: Lung, AJCC 7th Edition - Clinical: Stage IA (T1a, N0, M0) - Signed by Baird Cancer, PA on 05/06/2011  Adenocarcinoma of upper lobe of left lung Staging form: Lung, AJCC 8th Edition - Clinical stage from 07/16/2022: Stage IVA (cT1b, cN0, pM1b) - Unsigned    Prior Therapy: 1. Right upper lobectomy (February 2012) for right lung cancer 2. SBRT (November 2015) for stage I left adenocarcinoma  Current Therapy:  Pembrolizumab    HISTORY OF PRESENT ILLNESS:   Oncology History  Adenocarcinoma of lung  09/30/2010 Cancer Staging   CT of chest showed spiculated lesion   10/15/2010 Surgery   Right upper lobectomy by Dr. Arlyce Dice   10/16/2010 Remission     10/24/2013 Imaging   CT Chest-  Enlarging subpleural nodule in the left lower lobe, now 9 mm, with distortion of the overlying pleura. Finding is concerning for a small bronchogenic carcinoma   01/23/2014 Imaging   CT Chest- Left lower lobe nodule has enlarged from 11/01/2012 and is worrisome for primary bronchogenic carcinoma.    07/04/2014 - 07/10/2014 Radiation Therapy   SBRT by Dr. Isidore Moos in 3 fractions.   07/05/2015 Imaging   perifissural nodule in LLL appears slightly smaller with parenchymal opacity attrib to XRT, likely inflamm pathcy airspace dz more inferiorly in LLL, airspace dz in RLL resolved. no metastatic disease   Primary cancer of left lower lobe of lung ([place])  06/18/2014 Initial Diagnosis   Left lower lobe nodule suspicious for primary lung carcinoma   07/04/2014 - 07/10/2014 Radiation Therapy   SBRT   10/12/2014 Imaging   No acute findings within the chest. Decrease in size of left lower lobe perifissural nodule compatible with response to therapy. No new or progressive disease identified within the chest.    Adenocarcinoma of upper lobe of left lung  07/16/2022 Initial Diagnosis   Adenocarcinoma of upper lobe of left lung ([place])   07/21/2022 -  Chemotherapy   Patient is on Treatment Plan : LUNG NSCLC Pembrolizumab (200) q21d        INTERVAL HISTORY:   [place] is a 77 y.o. male presenting to clinic today for follow up of bilateral lung cancers, normocytic anemia, and B12 deficiency. He was last seen by me on 11/19/22.  Today, he states that he is doing well overall. His appetite level is at 90%. His energy level is at 75%.  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 dense RCA calcification --Non-ischemic Myoview   History of kidney stones    in the 70's   History of radiation therapy 07/04/14, 07/06/14, 07/10/14   LLL lung nodule/54 Gy/3 fx- SBRT   Hypercholesterolemia 05/06/2011   Pneumonia due to Streptococcus    HX. POST OPERATIVELY   Port-A-Cath in place 07/21/2022   Pre-diabetes    Stress fx pelvis    DUE TO AA IN 1970    Surgical History: Past Surgical History:  Procedure Laterality Date   BACK SURGERY  07/04/2017   fusion above previous surgery   BACK SURGERY  2011   metal plate l-4,L5   BRONCHIAL BIOPSY  06/03/2022   Procedure: BRONCHIAL BIOPSIES;  Surgeon: Garner Nash, DO;  Location: [facility];  Service: Pulmonary;;   BRONCHIAL BRUSHINGS  06/03/2022   Procedure: BRONCHIAL BRUSHINGS;  Surgeon: Garner Nash, DO;  Location: [facility];  Service:  Pulmonary;;   BRONCHIAL NEEDLE ASPIRATION BIOPSY  06/03/2022   Procedure: BRONCHIAL NEEDLE ASPIRATION BIOPSIES;  Surgeon: Garner Nash, DO;  Location: [facility];  Service: Pulmonary;;   Coroanry Calcium Score  03/2017   1043. Noted especially in RCA. Dense calcification, recommend Myoview over Cor CTA.   FIDUCIAL MARKER PLACEMENT  06/03/2022   Procedure: FIDUCIAL MARKER PLACEMENT;  Surgeon: Garner Nash, DO;  Location: [facility];  Service: Pulmonary;;   IR IMAGING GUIDED PORT INSERTION  08/04/2022   LIPOMA EXCISION N/A 10/14/2017   Procedure: EXCISION LIPOMA ON BACK;  Surgeon: Virl Cagey, MD;  Location: [facility];  Service: General;  Laterality: N/A;   LIPOMA EXCISION Left 11/19/2018   Procedure: MINOR EXCISION OF SEBACEOUS CYST NECK;  Surgeon: Virl Cagey, MD;  Location: [facility];  Service: General;  Laterality: Left;  pt knows to arrive at 7:00   LUNG LOBECTOMY  2012   RT [place]  04/2017   EF 60%. Hypertensive response to exercise. (6:21 min, 7.7 METs) No EKG changes. LOW RISK    Social History: Social History   Socioeconomic History   Marital status: Married    Spouse name: Not on file   Number of children: 3   Years of education: Not on file   Highest education level: Not on file  Occupational History   Not on file  Tobacco Use   Smoking status: Former    Packs/day: 1.50    Years: 59.00    Additional pack years: 0.00    Total pack years: 88.50    Types: Cigarettes    Quit date: 09/08/2010    Years since quitting: 12.2   Smokeless tobacco: Current    Types: Chew   Tobacco comments:    still chews tobacco, he tells me he hasn't in the past 2 days.   Vaping Use   Vaping Use: Never used  Substance and Sexual Activity   Alcohol use: No   Drug use: No   Sexual activity: Not Currently  Other Topics Concern   Not on file  Social History Narrative   Not on file   Social Determinants of Health   Financial Resource Strain: Not on  file  Food Insecurity: Not on file  Transportation Needs:  No Transportation Needs (07/13/2018)   PRAPARE - Hydrologist (Medical): No    Lack of Transportation (Non-Medical): No  Physical Activity: Not on file  Stress: Not on file  Social Connections: Not on file  Intimate Partner Violence: Not At Risk (07/13/2018)   Humiliation, Afraid, Rape, and Kick questionnaire    Fear of Current or Ex-Partner: No    Emotionally Abused: No    Physically Abused: No    Sexually Abused: No    Family History: Family History  Problem Relation Age of Onset   Hypertension Mother    Kidney disease Father    Heart attack Brother    Cancer Maternal Uncle        prostate cancer   Cancer Maternal Uncle        prostate cancer   Cancer Maternal Uncle        prostate cancer   Cancer Maternal Uncle        prostate cancer    Current Medications:  Current Outpatient Medications:    albuterol (PROVENTIL HFA;VENTOLIN HFA) 108 (90 Base) MCG/ACT inhaler, Inhale 2 puffs into the lungs every 4 (four) hours as needed for wheezing or shortness of breath., Disp: 2 Inhaler, Rfl: 3   albuterol (PROVENTIL) (2.5 MG/3ML) 0.083% nebulizer solution, Take 2.5 mg by nebulization every 6 (six) hours as needed for wheezing or shortness of breath., Disp: , Rfl:    betamethasone dipropionate 0.05 % cream, Apply topically 2 (two) times daily., Disp: 30 g, Rfl: 1   cholecalciferol (VITAMIN D3) 25 MCG (1000 UNIT) tablet, Take 1,000 Units by mouth daily., Disp: , Rfl:    cimetidine (TAGAMET) 200 MG tablet, Take 400-600 mg by mouth daily as needed (for heartburn)., Disp: , Rfl:    clobetasol ointment (TEMOVATE) 0.05 %, Apply topically 2 (two) times daily as needed., Disp: , Rfl:    ferrous sulfate 325 (65 FE) MG tablet, Take 325 mg by mouth daily with breakfast., Disp: , Rfl:    fluticasone (FLONASE) 50 MCG/ACT nasal spray, Place 2 sprays into both nostrils daily as needed for rhinitis., Disp: , Rfl:     guaiFENesin (MUCINEX) 600 MG 12 hr tablet, Take 600 mg by mouth daily., Disp: , Rfl:    hydrochlorothiazide (HYDRODIURIL) 25 MG tablet, Take 12.5 mg by mouth daily., Disp: , Rfl:    HYDROcodone-acetaminophen (NORCO) 10-325 MG tablet, Take 1-2 tablets by mouth every 4 (four) hours as needed for moderate pain., Disp: 240 tablet, Rfl: 0   hydrOXYzine (VISTARIL) 25 MG capsule, Take 25-50 mg by mouth at bedtime as needed., Disp: , Rfl:    Menthol, Topical Analgesic, (ICY HOT EX), Apply 1 Application topically daily as needed (pain)., Disp: , Rfl:    naproxen sodium (ALEVE) 220 MG tablet, Take 220 mg by mouth daily as needed (pain)., Disp: , Rfl:    Omega-3 Fatty Acids (FISH OIL) 1000 MG CAPS, Take 1,000 mg by mouth daily., Disp: , Rfl:    Oxycodone HCl 10 MG TABS, Take 10 mg by mouth 2 (two) times daily as needed (pain)., Disp: , Rfl:    pantoprazole (PROTONIX) 40 MG tablet, Take 40 mg by mouth See admin instructions. Take 40 mg daily, may take a second 40 mg dose as needed for heartburn, Disp: , Rfl:    PEMBROLIZUMAB IV, Inject into the vein every 21 ( twenty-one) days., Disp: , Rfl:    polyethylene glycol powder (GLYCOLAX/MIRALAX) 17 GM/SCOOP powder, Take 17 g by mouth daily  as needed for mild constipation or moderate constipation., Disp: , Rfl:    potassium chloride SA (KLOR-CON M) 20 MEQ tablet, Take 1 tablet (20 mEq total) by mouth daily., Disp: 30 tablet, Rfl: 3   predniSONE (DELTASONE) 5 MG tablet, Take 1 tablet (5 mg total) by mouth daily with breakfast., Disp: 30 tablet, Rfl: 4   rosuvastatin (CRESTOR) 40 MG tablet, Take 40 mg by mouth at bedtime., Disp: , Rfl:    tamsulosin (FLOMAX) 0.4 MG CAPS capsule, Take 1 capsule (0.4 mg total) by mouth daily., Disp: 30 capsule, Rfl: 3   triamcinolone cream (KENALOG) 0.1 %, Apply 1 Application topically daily as needed (tick bite/itching)., Disp: , Rfl:    triazolam (HALCION) 0.25 MG tablet, Take 0.25 mg by mouth at bedtime., Disp: , Rfl:    vitamin B-12  (CYANOCOBALAMIN) 1000 MCG tablet, Take 1,000 mcg by mouth every other day., Disp: , Rfl:    lidocaine-prilocaine (EMLA) cream, APPLY A SMALL AMOUNT TO PORT A CATH SITE AND COVER WITH PLASTIC WRAP 1 HOUR PRIOR TO INFUSION APPOINTMENTS (Patient not taking: Reported on 12/10/2022), Disp: 30 g, Rfl: 3   prochlorperazine (COMPAZINE) 10 MG tablet, Take 1 tablet (10 mg total) by mouth every 6 (six) hours as needed for nausea or vomiting. (Patient not taking: Reported on 12/10/2022), Disp: 30 tablet, Rfl: 1 No current facility-administered medications for this visit.  Facility-Administered Medications Ordered in Other Visits:    sodium chloride flush (NS) 0.9 % injection 10 mL, 10 mL, Intracatheter, PRN, Derek Jack, MD, 10 mL at 10/07/22 1523   sodium chloride flush (NS) 0.9 % injection 10 mL, 10 mL, Intracatheter, PRN, Derek Jack, MD, 10 mL at 10/28/22 1446   sodium chloride flush (NS) 0.9 % injection 10 mL, 10 mL, Intracatheter, PRN, Derek Jack, MD, 10 mL at 11/19/22 1547   sodium chloride flush (NS) 0.9 % injection 10 mL, 10 mL, Intracatheter, PRN, Derek Jack, MD, 10 mL at 12/10/22 1253   Allergies: Allergies  Allergen Reactions   Fentanyl Other (See Comments)    CAUSED HALLUCINATIONS/05-03-13 pt reports it was oral fentanyl that caused hallucinations    Gabapentin Other (See Comments)    Bones throbbed, headaches, weakness   Nabumetone     Unknown reaction    Aspirin Nausea Only    REVIEW OF SYSTEMS:   Review of Systems  Constitutional:  Negative for chills, fatigue and fever.  HENT:   Negative for lump/mass, mouth sores, nosebleeds, sore throat and trouble swallowing.   Eyes:  Negative for eye problems.  Respiratory:  Positive for cough and shortness of breath.   Cardiovascular:  Negative for chest pain, leg swelling and palpitations.  Gastrointestinal:  Negative for abdominal pain, constipation, diarrhea, nausea and vomiting.  Genitourinary:  Negative  for bladder incontinence, difficulty urinating, dysuria, frequency, hematuria and nocturia.   Musculoskeletal:  Negative for arthralgias, back pain, flank pain, myalgias and neck pain.  Skin:  Positive for itching and rash.  Neurological:  Negative for dizziness, headaches and numbness.  Hematological:  Does not bruise/bleed easily.  Psychiatric/Behavioral:  Negative for depression, sleep disturbance and suicidal ideas. The patient is not nervous/anxious.   All other systems reviewed and are negative.    VITALS:   There were no vitals taken for this visit.  Wt Readings from Last 3 Encounters:  12/10/22 133 lb (60.3 kg)  11/19/22 131 lb 9.6 oz (59.7 kg)  10/28/22 136 lb 6.4 oz (61.9 kg)    There is no height or  weight on file to calculate BMI.  Performance status (ECOG): 1 - Symptomatic but completely ambulatory  PHYSICAL EXAM:   Physical Exam Vitals and nursing note reviewed. Exam conducted with a chaperone present.  Constitutional:      Appearance: Normal appearance.  Cardiovascular:     Rate and Rhythm: Normal rate and regular rhythm.     Pulses: Normal pulses.     Heart sounds: Normal heart sounds.  Pulmonary:     Effort: Pulmonary effort is normal.     Breath sounds: Normal breath sounds.  Abdominal:     Palpations: Abdomen is soft. There is no hepatomegaly, splenomegaly or mass.     Tenderness: There is no abdominal tenderness.  Musculoskeletal:     Right lower leg: No edema.     Left lower leg: No edema.  Lymphadenopathy:     Cervical: No cervical adenopathy.     Right cervical: No superficial, deep or posterior cervical adenopathy.    Left cervical: No superficial, deep or posterior cervical adenopathy.     Upper Body:     Right upper body: No supraclavicular or axillary adenopathy.     Left upper body: No supraclavicular or axillary adenopathy.  Neurological:     General: No focal deficit present.     Mental Status: He is alert and oriented to person, place,  and time.  Psychiatric:        Mood and Affect: Mood normal.        Behavior: Behavior normal.     LABS:      Latest Ref Rng & Units 12/10/2022    9:48 AM 11/19/2022   10:39 AM 10/28/2022   11:25 AM  CBC  WBC 4.0 - 10.5 K/uL 8.9  9.2  8.3   Hemoglobin 13.0 - 17.0 g/dL 13.1  13.3  12.0   Hematocrit 39.0 - 52.0 % 39.3  38.8  36.3   Platelets 150 - 400 K/uL 239  220  284       Latest Ref Rng & Units 12/10/2022    9:48 AM 11/19/2022   10:39 AM 10/28/2022   11:25 AM  CMP  Glucose 70 - 99 mg/dL 117  100  109   BUN 8 - 23 mg/dL 21  19  9    Creatinine 0.61 - 1.24 mg/dL 0.87  1.06  0.77   Sodium 135 - 145 mmol/L 133  133  133   Potassium 3.5 - 5.1 mmol/L 3.4  2.7  3.8   Chloride 98 - 111 mmol/L 101  98  100   CO2 22 - 32 mmol/L 24  23  25    Calcium 8.9 - 10.3 mg/dL 8.7  8.4  8.3   Total Protein 6.5 - 8.1 g/dL 7.0  6.8  6.2   Total Bilirubin 0.3 - 1.2 mg/dL 0.7  0.7  0.4   Alkaline Phos 38 - 126 U/L 87  93  99   AST 15 - 41 U/L 17  18  18    ALT 0 - 44 U/L 20  18  17       No results found for: "CEA1", "CEA" / No results found for: "CEA1", "CEA" No results found for: "PSA1" No results found for: "[id number]" No results found for: "CAN125"  No results found for: "TOTALPROTELP", "ALBUMINELP", "A1GS", "A2GS", "BETS", "BETA2SER", "GAMS", "MSPIKE", "SPEI" Lab Results  Component Value Date   TIBC 310 09/30/2022   TIBC 323 04/14/2022   TIBC 373 10/01/2021   FERRITIN 108 09/30/2022  FERRITIN 65 04/14/2022   FERRITIN 49 10/01/2021   IRONPCTSAT 13 (L) 09/30/2022   IRONPCTSAT 22 04/14/2022   IRONPCTSAT 27 10/01/2021   Lab Results  Component Value Date   LDH 120 06/28/2021   LDH 130 12/20/2020   LDH 97 (L) 07/12/2018     STUDIES:   No results found.

## 2022-12-10 ENCOUNTER — Inpatient Hospital Stay: Payer: Medicare Other

## 2022-12-10 ENCOUNTER — Inpatient Hospital Stay: Payer: Medicare Other | Attending: Physician Assistant

## 2022-12-10 ENCOUNTER — Inpatient Hospital Stay (HOSPITAL_BASED_OUTPATIENT_CLINIC_OR_DEPARTMENT_OTHER): Payer: Medicare Other | Admitting: Hematology

## 2022-12-10 VITALS — BP 125/65 | HR 65 | Temp 97.0°F | Resp 18

## 2022-12-10 DIAGNOSIS — C3412 Malignant neoplasm of upper lobe, left bronchus or lung: Secondary | ICD-10-CM | POA: Insufficient documentation

## 2022-12-10 DIAGNOSIS — Z95828 Presence of other vascular implants and grafts: Secondary | ICD-10-CM | POA: Diagnosis not present

## 2022-12-10 DIAGNOSIS — E538 Deficiency of other specified B group vitamins: Secondary | ICD-10-CM | POA: Diagnosis not present

## 2022-12-10 DIAGNOSIS — Z87891 Personal history of nicotine dependence: Secondary | ICD-10-CM | POA: Diagnosis not present

## 2022-12-10 DIAGNOSIS — C7951 Secondary malignant neoplasm of bone: Secondary | ICD-10-CM | POA: Diagnosis not present

## 2022-12-10 DIAGNOSIS — Z5112 Encounter for antineoplastic immunotherapy: Secondary | ICD-10-CM | POA: Diagnosis present

## 2022-12-10 LAB — COMPREHENSIVE METABOLIC PANEL
ALT: 20 U/L (ref 0–44)
AST: 17 U/L (ref 15–41)
Albumin: 3.6 g/dL (ref 3.5–5.0)
Alkaline Phosphatase: 87 U/L (ref 38–126)
Anion gap: 8 (ref 5–15)
BUN: 21 mg/dL (ref 8–23)
CO2: 24 mmol/L (ref 22–32)
Calcium: 8.7 mg/dL — ABNORMAL LOW (ref 8.9–10.3)
Chloride: 101 mmol/L (ref 98–111)
Creatinine, Ser: 0.87 mg/dL (ref 0.61–1.24)
GFR, Estimated: >60 mL/min (ref >60)
Glucose, Bld: 117 mg/dL — ABNORMAL HIGH (ref 70–99)
Potassium: 3.4 mmol/L — ABNORMAL LOW (ref 3.5–5.1)
Sodium: 133 mmol/L — ABNORMAL LOW (ref 135–145)
Total Bilirubin: 0.7 mg/dL (ref 0.3–1.2)
Total Protein: 7 g/dL (ref 6.5–8.1)

## 2022-12-10 LAB — CBC WITH DIFFERENTIAL/PLATELET
Abs Immature Granulocytes: 0.04 10*3/uL (ref 0.00–0.07)
Basophils Absolute: 0.1 10*3/uL (ref 0.0–0.1)
Basophils Relative: 1 %
Eosinophils Absolute: 0.3 10*3/uL (ref 0.0–0.5)
Eosinophils Relative: 4 %
HCT: 39.3 % (ref 39.0–52.0)
Hemoglobin: 13.1 g/dL (ref 13.0–17.0)
Immature Granulocytes: 1 %
Lymphocytes Relative: 9 %
Lymphs Abs: 0.8 10*3/uL (ref 0.7–4.0)
MCH: 32.9 pg (ref 26.0–34.0)
MCHC: 33.3 g/dL (ref 30.0–36.0)
MCV: 98.7 fL (ref 80.0–100.0)
Monocytes Absolute: 0.7 10*3/uL (ref 0.1–1.0)
Monocytes Relative: 8 %
Neutro Abs: 6.9 10*3/uL (ref 1.7–7.7)
Neutrophils Relative %: 77 %
Platelets: 239 10*3/uL (ref 150–400)
RBC: 3.98 MIL/uL — ABNORMAL LOW (ref 4.22–5.81)
RDW: 13 % (ref 11.5–15.5)
WBC: 8.9 10*3/uL (ref 4.0–10.5)
nRBC: 0 % (ref 0.0–0.2)

## 2022-12-10 LAB — MAGNESIUM: Magnesium: 1.8 mg/dL (ref 1.7–2.4)

## 2022-12-10 MED ORDER — SODIUM CHLORIDE 0.9% FLUSH
10.0000 mL | INTRAVENOUS | Status: DC | PRN
  Administered 2022-12-10: 10 mL

## 2022-12-10 MED ORDER — SODIUM CHLORIDE 0.9 % IV SOLN: Freq: Once | INTRAVENOUS | Status: AC

## 2022-12-10 MED ORDER — PREDNISONE 5 MG PO TABS: 5.0000 mg | ORAL_TABLET | Freq: Every day | ORAL | 4 refills | Status: DC

## 2022-12-10 MED ORDER — SODIUM CHLORIDE 0.9% FLUSH
10.0000 mL | Freq: Once | INTRAVENOUS | Status: AC
  Administered 2022-12-10: 10 mL via INTRAVENOUS

## 2022-12-10 MED ORDER — SODIUM CHLORIDE 0.9 % IV SOLN
200.0000 mg | Freq: Once | INTRAVENOUS | Status: AC
  Administered 2022-12-10: 200 mg via INTRAVENOUS
  Filled 2022-12-10: qty 8

## 2022-12-10 MED ORDER — HEPARIN SOD (PORK) LOCK FLUSH 100 UNIT/ML IV SOLN
500.0000 [IU] | Freq: Once | INTRAVENOUS | Status: AC | PRN
  Administered 2022-12-10: 500 [IU]

## 2022-12-10 NOTE — Patient Instructions (Signed)
[facility] at [facility] Discharge Instructions   You were seen and examined today by Dr. Katragadda.  He reviewed the results of your lab work which are normal/stable.   We will proceed with your treatment today.  Return as scheduled.    Thank you for choosing [facility] at [facility] to provide your oncology and hematology care.  To afford each patient quality time with our provider, please arrive at least 15 minutes before your scheduled appointment time.   If you have a lab appointment with the Cancer Center please come in thru the Main Entrance and check in at the main information desk.  You need to re-schedule your appointment should you arrive 10 or more minutes late.  We strive to give you quality time with our providers, and arriving late affects you and other patients whose appointments are after yours.  Also, if you no show three or more times for appointments you may be dismissed from the clinic at the providers discretion.     Again, thank you for choosing [facility].  Our hope is that these requests will decrease the amount of time that you wait before being seen by our physicians.       _____________________________________________________________  Should you have questions after your visit to  Cancer Center, please contact our office at [phone] and follow the prompts.  Our office hours are 8:00 a.m. and 4:30 p.m. Monday - Friday.  Please note that voicemails left after 4:00 p.m. may not be returned until the following business day.  We are closed weekends and major holidays.  You do have access to a nurse 24-7, just call the main number to the clinic [phone] and do not press any options, hold on the line and a nurse will answer the phone.    For prescription refill requests, have your pharmacy contact our office and allow 72 hours.    Due to Covid, you will need to wear a mask upon entering  the hospital. If you do not have a mask, a mask will be given to you at the Main Entrance upon arrival. For doctor visits, patients may have 1 support person age 18 or older with them. For treatment visits, patients can not have anyone with them due to social distancing guidelines and our immunocompromised population.      

## 2022-12-10 NOTE — Patient Instructions (Signed)
[facility]  Discharge Instructions: Thank you for choosing [facility] to provide your oncology and hematology care.  If you have a lab appointment with the [facility] - please note that after April 8th, 2024, all labs will be drawn in the cancer center.  You do not have to check in or register with the main entrance as you have in the past but will complete your check-in in the cancer center.  Wear comfortable clothing and clothing appropriate for easy access to any Portacath or PICC line.   We strive to give you quality time with your provider. You may need to reschedule your appointment if you arrive late (15 or more minutes).  Arriving late affects you and other patients whose appointments are after yours.  Also, if you miss three or more appointments without notifying the office, you may be dismissed from the clinic at the provider's discretion.      For prescription refill requests, have your pharmacy contact our office and allow 72 hours for refills to be completed.    Today you received the following chemotherapy and/or immunotherapy agents Keytruda   To help prevent nausea and vomiting after your treatment, we encourage you to take your nausea medication as directed.  Pembrolizumab Injection What is this medication? PEMBROLIZUMAB (PEM broe LIZ ue mab) treats some types of cancer. It works by helping your immune system slow or stop the spread of cancer cells. It is a monoclonal antibody. This medicine may be used for other purposes; ask your health care provider or pharmacist if you have questions. COMMON BRAND NAME(S): Keytruda What should I tell my care team before I take this medication? They need to know if you have any of these conditions: Allogeneic stem cell transplant (uses someone else's stem cells) Autoimmune diseases, such as Crohn disease, ulcerative colitis, lupus History of chest radiation Nervous system problems, such as  Guillain-Barre syndrome, myasthenia gravis Organ transplant An unusual or allergic reaction to pembrolizumab, other medications, foods, dyes, or preservatives Pregnant or trying to get pregnant Breast-feeding How should I use this medication? This medication is injected into a vein. It is given by your care team in a hospital or clinic setting. A special MedGuide will be given to you before each treatment. Be sure to read this information carefully each time. Talk to your care team about the use of this medication in children. While it may be prescribed for children as young as 6 months for selected conditions, precautions do apply. Overdosage: If you think you have taken too much of this medicine contact a poison control center or emergency room at once. NOTE: This medicine is only for you. Do not share this medicine with others. What if I miss a dose? Keep appointments for follow-up doses. It is important not to miss your dose. Call your care team if you are unable to keep an appointment. What may interact with this medication? Interactions have not been studied. This list may not describe all possible interactions. Give your health care provider a list of all the medicines, herbs, non-prescription drugs, or dietary supplements you use. Also tell them if you smoke, drink alcohol, or use illegal drugs. Some items may interact with your medicine. What should I watch for while using this medication? Your condition will be monitored carefully while you are receiving this medication. You may need blood work while taking this medication. This medication may cause serious skin reactions. They can happen weeks to months  after starting the medication. Contact your care team right away if you notice fevers or flu-like symptoms with a rash. The rash may be red or purple and then turn into blisters or peeling of the skin. You may also notice a red rash with swelling of the face, lips, or lymph nodes in your  neck or under your arms. Tell your care team right away if you have any change in your eyesight. Talk to your care team if you may be pregnant. Serious birth defects can occur if you take this medication during pregnancy and for 4 months after the last dose. You will need a negative pregnancy test before starting this medication. Contraception is recommended while taking this medication and for 4 months after the last dose. Your care team can help you find the option that works for you. Do not breastfeed while taking this medication and for 4 months after the last dose. What side effects may I notice from receiving this medication? Side effects that you should report to your care team as soon as possible: Allergic reactions--skin rash, itching, hives, swelling of the face, lips, tongue, or throat Dry cough, shortness of breath or trouble breathing Eye pain, redness, irritation, or discharge with blurry or decreased vision Heart muscle inflammation--unusual weakness or fatigue, shortness of breath, chest pain, fast or irregular heartbeat, dizziness, swelling of the ankles, feet, or hands Hormone gland problems--headache, sensitivity to light, unusual weakness or fatigue, dizziness, fast or irregular heartbeat, increased sensitivity to cold or heat, excessive sweating, constipation, hair loss, increased thirst or amount of urine, tremors or shaking, irritability Infusion reactions--chest pain, shortness of breath or trouble breathing, feeling faint or lightheaded Kidney injury (glomerulonephritis)--decrease in the amount of urine, red or dark brown urine, foamy or bubbly urine, swelling of the ankles, hands, or feet Liver injury--right upper belly pain, loss of appetite, nausea, light-colored stool, dark yellow or brown urine, yellowing skin or eyes, unusual weakness or fatigue Pain, tingling, or numbness in the hands or feet, muscle weakness, change in vision, confusion or trouble speaking, loss of  balance or coordination, trouble walking, seizures Rash, fever, and swollen lymph nodes Redness, blistering, peeling, or loosening of the skin, including inside the mouth Sudden or severe stomach pain, bloody diarrhea, fever, nausea, vomiting Side effects that usually do not require medical attention (report to your care team if they continue or are bothersome): Bone, joint, or muscle pain Diarrhea Fatigue Loss of appetite Nausea Skin rash This list may not describe all possible side effects. Call your doctor for medical advice about side effects. You may report side effects to FDA at 1-800-FDA-1088. Where should I keep my medication? This medication is given in a hospital or clinic. It will not be stored at home. NOTE: This sheet is a summary. It may not cover all possible information. If you have questions about this medicine, talk to your doctor, pharmacist, or health care provider.  2023 Elsevier/Gold Standard (2022-01-07 00:00:00)   BELOW ARE SYMPTOMS THAT SHOULD BE REPORTED IMMEDIATELY: *FEVER GREATER THAN 100.4 F (38 C) OR HIGHER *CHILLS OR SWEATING *NAUSEA AND VOMITING THAT IS NOT CONTROLLED WITH YOUR NAUSEA MEDICATION *UNUSUAL SHORTNESS OF BREATH *UNUSUAL BRUISING OR BLEEDING *URINARY PROBLEMS (pain or burning when urinating, or frequent urination) *BOWEL PROBLEMS (unusual diarrhea, constipation, pain near the anus) TENDERNESS IN MOUTH AND THROAT WITH OR WITHOUT PRESENCE OF ULCERS (sore throat, sores in mouth, or a toothache) UNUSUAL RASH, SWELLING OR PAIN  UNUSUAL VAGINAL DISCHARGE OR ITCHING   Items  with * indicate a potential emergency and should be followed up as soon as possible or go to the Emergency Department if any problems should occur.  Please show the CHEMOTHERAPY ALERT CARD or IMMUNOTHERAPY ALERT CARD at check-in to the Emergency Department and triage nurse.  Should you have questions after your visit or need to cancel or reschedule your appointment, please  contact [facility] [phone]  and follow the prompts.  Office hours are 8:00 a.m. to 4:30 p.m. Monday - Friday. Please note that voicemails left after 4:00 p.m. may not be returned until the following business day.  We are closed weekends and major holidays. You have access to a nurse at all times for urgent questions. Please call the main number to the clinic [phone] and follow the prompts.  For any non-urgent questions, you may also contact your provider using MyChart. We now offer e-Visits for anyone 86 and older to request care online for non-urgent symptoms. For details visit [email or web address].   Also download the MyChart app! Go to the app store, search "MyChart", open the app, select [facility], and log in with your MyChart username and password.

## 2022-12-10 NOTE — Progress Notes (Signed)
Patient has been examined by Dr. Katragadda. Vital signs and labs have been reviewed by MD - ANC, Creatinine, LFTs, hemoglobin, and platelets are within treatment parameters per M.D. - pt may proceed with treatment.  Primary RN and pharmacy notified.  

## 2022-12-10 NOTE — Progress Notes (Signed)
Patients port flushed without difficulty.  Good blood return noted with no bruising or swelling noted at site.  Stable during access and blood draw.  Patient to remain accessed for treatment. 

## 2022-12-10 NOTE — Progress Notes (Signed)
Patient presents today for Keytruda infusion. Patient is in satisfactory condition with no new complaints voiced.  Vital signs are stable.  Labs reviewed by Dr. Delton Coombes during the office visit and all labs are within treatment parameters.  We will proceed with treatment per MD orders.   Keytruda given today per MD orders. Tolerated infusion without adverse affects. Vital signs stable. No complaints at this time. Discharged from clinic ambulatory in stable condition. Alert and oriented x 3. F/U with [facility] as scheduled.

## 2022-12-16 ENCOUNTER — Other Ambulatory Visit: Payer: Self-pay

## 2022-12-16 DIAGNOSIS — M1991 Primary osteoarthritis, unspecified site: Secondary | ICD-10-CM | POA: Diagnosis not present

## 2022-12-16 DIAGNOSIS — J0100 Acute maxillary sinusitis, unspecified: Secondary | ICD-10-CM | POA: Diagnosis not present

## 2022-12-16 DIAGNOSIS — M5136 Other intervertebral disc degeneration, lumbar region: Secondary | ICD-10-CM | POA: Diagnosis not present

## 2022-12-16 DIAGNOSIS — I700 Atherosclerosis of aorta: Secondary | ICD-10-CM | POA: Diagnosis not present

## 2022-12-16 DIAGNOSIS — M48062 Spinal stenosis, lumbar region with neurogenic claudication: Secondary | ICD-10-CM | POA: Diagnosis not present

## 2022-12-16 DIAGNOSIS — C3490 Malignant neoplasm of unspecified part of unspecified bronchus or lung: Secondary | ICD-10-CM | POA: Diagnosis not present

## 2022-12-16 DIAGNOSIS — Z6821 Body mass index (BMI) 21.0-21.9, adult: Secondary | ICD-10-CM | POA: Diagnosis not present

## 2022-12-16 DIAGNOSIS — G8929 Other chronic pain: Secondary | ICD-10-CM | POA: Diagnosis not present

## 2022-12-16 DIAGNOSIS — I10 Essential (primary) hypertension: Secondary | ICD-10-CM | POA: Diagnosis not present

## 2022-12-16 DIAGNOSIS — J449 Chronic obstructive pulmonary disease, unspecified: Secondary | ICD-10-CM | POA: Diagnosis not present

## 2022-12-16 DIAGNOSIS — C3432 Malignant neoplasm of lower lobe, left bronchus or lung: Secondary | ICD-10-CM | POA: Diagnosis not present

## 2022-12-30 NOTE — Progress Notes (Signed)
[facility] [address] [place] [address]    Clinic Day:  12/31/2022  Referring physician: Doreatha Massed, MD  Patient Care Team: Elfredia Nevins, MD as PCP - General (Internal Medicine) Marykay Lex, MD as PCP - Cardiology (Cardiology) Malissa Hippo, MD as Consulting Physician (Gastroenterology) Lonie Peak, MD as Attending Physician (Radiation Oncology)   ASSESSMENT & PLAN:   Assessment: 1.  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 - Status post right upper lobectomy in February 2012 for right lung cancer. - LDCT chest (04/14/2022): Enlarging spiculated nodular soft tissue thickening in the posterior left upper lobe measuring 11.4 mm. - PET scan (04/24/2022): Left upper lobe lung nodule 14 mm with SUV 3.62.  Hypermetabolic focus in the inferior aspect of the right scapula without discrete CT correlate with SUV 5.71.  No hypermetabolic adenopathy. - LUL lesion FNA (06/03/2022): Adenocarcinoma - Right scapula biopsy (07/03/2022) adenocarcinoma consistent with lung primary - NGS: PD-L1 22 C3 TPS 100%, T p53 pathogenic variant, MSI-stable, TMB-low, no other targetable mutations - Cycle 1 of pembrolizumab started on 07/21/2022 - XRT to the right scapular lesion and left upper lobe lung lesion 5 fractions completed on 08/18/2022.   2.  Cystic lesion of left kidney - CTA PE on 04/14/2022 showed complex cystic lesion of the left kidney.  PET scan also showed exophytic 18 mm left renal lesion suspicious for malignancy. - MRI abdomen on 05/16/2022 showed benign appearing Bosniak category 1 and 2 left renal cyst with no evidence of neoplasm.  No further work-up needed.    Plan: 1.  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 - No other immunotherapy related side effects except some rash and itching. - Labs today: Normal LFTs and creatinine.  CBC grossly normal.  TSH is 3.3. - Proceed with treatment today.  RTC 3 weeks for follow-up with repeat PET scan.   2.  Left posterior chest wall rash: - There is a erythematous maculopapular rash on bilateral chest wall.  Continue Atarax and steroid cream as needed. - Continue prednisone 5 mg daily.   3.  B12 deficiency - Continue B12 every other day and iron tablet daily.   4.  Chronic back pain after 20+ years duration: - Continue narcotics per Dr. Sharyon Medicus office.   5.  Hypokalemia: - Continue potassium 20 mEq daily.  Orders Placed This Encounter  Procedures   NM PET Image Restag (PS) Skull Base To Thigh    Standing Status:   Future    Standing Expiration Date:   12/31/2023    Order Specific Question:   If indicated for the ordered procedure, I authorize the administration of a radiopharmaceutical per Radiology protocol    Answer:   Yes    Order Specific Question:   Preferred imaging location?    Answer:   [facility]      I,Katie Daubenspeck,acting as a Neurosurgeon for [employer], MD.,have documented all relevant documentation on the behalf of Doreatha Massed, MD,as directed by  Doreatha Massed, MD while in the presence of Doreatha Massed, MD.   I, Doreatha Massed MD, have reviewed the above documentation for accuracy and completeness, and I agree with the above.   Doreatha Massed, MD   4/24/20246:04  PM  CHIEF COMPLAINT:   Diagnosis: bilateral lung cancers, normocytic anemia, and B12 deficiency    Cancer Staging  Adenocarcinoma of lung Staging form: Lung, AJCC 7th Edition - Clinical: Stage IA (T1a, N0, M0) - Signed by [name], PA on 05/06/2011  Adenocarcinoma of upper lobe of left lung Staging form: Lung, AJCC 8th Edition - Clinical stage from 07/16/2022: Stage IVA (cT1b, cN0,  pM1b) - Unsigned    Prior Therapy: 1. Right upper lobectomy (February 2012) for right lung cancer 2. SBRT (November 2015) for stage I left adenocarcinoma  Current Therapy:  Pembrolizumab    HISTORY OF PRESENT ILLNESS:   Oncology History  Adenocarcinoma of lung  09/30/2010 Cancer Staging   CT of chest showed spiculated lesion   10/15/2010 Surgery   Right upper lobectomy by Dr. Edwyna Shell   10/16/2010 Remission     10/24/2013 Imaging   CT Chest-  Enlarging subpleural nodule in the left lower lobe, now 9 mm, with distortion of the overlying pleura. Finding is concerning for a small bronchogenic carcinoma   01/23/2014 Imaging   CT Chest- Left lower lobe nodule has enlarged from 11/01/2012 and is worrisome for primary bronchogenic carcinoma.    07/04/2014 - 07/10/2014 Radiation Therapy   SBRT by Dr. Basilio Cairo in 3 fractions.   07/05/2015 Imaging   perifissural nodule in LLL appears slightly smaller with parenchymal opacity attrib to XRT, likely inflamm pathcy airspace dz more inferiorly in LLL, airspace dz in RLL resolved. no metastatic disease   Primary cancer of left lower lobe of lung (HCC)  06/18/2014 Initial Diagnosis   Left lower lobe nodule suspicious for primary lung carcinoma   07/04/2014 - 07/10/2014 Radiation Therapy   SBRT   10/12/2014 Imaging   No acute findings within the chest. Decrease in size of left lower lobe perifissural nodule compatible with response to therapy. No new or progressive disease identified within the chest.   Adenocarcinoma of upper lobe of left lung  07/16/2022 Initial Diagnosis   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)   07/21/2022 -  Chemotherapy   Patient is on Treatment Plan : LUNG NSCLC Pembrolizumab (200) q21d        INTERVAL HISTORY:   Maurice Orozco is a 77 y.o. male presenting to clinic today for follow up of bilateral lung cancers, normocytic anemia, and B12 deficiency. He was last seen by me on 12/10/22.  Today, he states that he is doing  well overall. His appetite level is at 90%. His energy level is at 80%.  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 dense RCA calcification --Non-ischemic Myoview   History of kidney stones    in the 70's   History of radiation therapy 07/04/14, 07/06/14, 07/10/14   LLL lung nodule/54 Gy/3 fx- SBRT   Hypercholesterolemia 05/06/2011   Pneumonia due to Streptococcus    HX. POST OPERATIVELY  Port-A-Cath in place 07/21/2022   Pre-diabetes    Stress fx pelvis    DUE TO AA IN 1970    Surgical History: Past Surgical History:  Procedure Laterality Date   BACK SURGERY  07/04/2017   fusion above previous surgery   BACK SURGERY  2011   metal plate W-0,J8   BRONCHIAL BIOPSY  06/03/2022   Procedure: BRONCHIAL BIOPSIES;  Surgeon: Josephine Igo, DO;  Location: [facility];  Service: Pulmonary;;   BRONCHIAL BRUSHINGS  06/03/2022   Procedure: BRONCHIAL BRUSHINGS;  Surgeon: Josephine Igo, DO;  Location: [facility];  Service: Pulmonary;;   BRONCHIAL NEEDLE ASPIRATION BIOPSY  06/03/2022   Procedure: BRONCHIAL NEEDLE ASPIRATION BIOPSIES;  Surgeon: Josephine Igo, DO;  Location: [facility];  Service: Pulmonary;;   Coroanry Calcium Score  03/2017   1043. Noted especially in RCA. Dense calcification, recommend Myoview over Cor CTA.   FIDUCIAL MARKER PLACEMENT  06/03/2022   Procedure: FIDUCIAL MARKER PLACEMENT;  Surgeon: Josephine Igo, DO;  Location: [facility];  Service:  Pulmonary;;   IR IMAGING GUIDED PORT INSERTION  08/04/2022   LIPOMA EXCISION N/A 10/14/2017   Procedure: EXCISION LIPOMA ON BACK;  Surgeon: Lucretia Roers, MD;  Location: [facility];  Service: General;  Laterality: N/A;   LIPOMA EXCISION Left 11/19/2018   Procedure: MINOR EXCISION OF SEBACEOUS CYST NECK;  Surgeon: Lucretia Roers, MD;  Location: [facility];  Service: General;  Laterality: Left;  pt knows to arrive at 7:00   LUNG LOBECTOMY  2012   RT UPPER LOBE   NM MYOVIEW LTD  04/2017   EF 60%. Hypertensive response to exercise. (6:21 min, 7.7 METs) No EKG changes. LOW RISK    Social History: Social History   Socioeconomic History   Marital status: Married    Spouse name: Not on file   Number of children: 3   Years of education: Not on file   Highest education level: Not on file  Occupational History   Not on file  Tobacco Use   Smoking status: Former    Packs/day: 1.50    Years: 59.00    Additional pack years: 0.00    Total pack years: 88.50    Types: Cigarettes    Quit date: 09/08/2010    Years since quitting: 12.3   Smokeless tobacco: Current    Types: Chew   Tobacco comments:    still chews tobacco, he tells me he hasn't in the past 2 days.   Vaping Use   Vaping Use: Never used  Substance and Sexual Activity   Alcohol use: No   Drug use: No   Sexual activity: Not Currently  Other Topics Concern   Not on file  Social History Narrative   Not on file   Social Determinants of Health   Financial Resource Strain: Not on file  Food Insecurity: Not on file  Transportation Needs: No Transportation Needs (07/13/2018)   PRAPARE - Administrator, Civil Service (Medical): No    Lack of Transportation (Non-Medical): No  Physical Activity: Not on file  Stress: Not on file  Social Connections: Not on file  Intimate Partner Violence: Not At Risk (07/13/2018)   Humiliation, Afraid, Rape, and Kick questionnaire    Fear of Current or Ex-Partner: No    Emotionally  Abused: No    Physically Abused: No    Sexually Abused: No    Family History: Family History  Problem Relation Age of Onset   Hypertension Mother  Kidney disease Father    Heart attack Brother    Cancer Maternal Uncle        prostate cancer   Cancer Maternal Uncle        prostate cancer   Cancer Maternal Uncle        prostate cancer   Cancer Maternal Uncle        prostate cancer    Current Medications:  Current Outpatient Medications:    albuterol (PROVENTIL HFA;VENTOLIN HFA) 108 (90 Base) MCG/ACT inhaler, Inhale 2 puffs into the lungs every 4 (four) hours as needed for wheezing or shortness of breath., Disp: 2 Inhaler, Rfl: 3   albuterol (PROVENTIL) (2.5 MG/3ML) 0.083% nebulizer solution, Take 2.5 mg by nebulization every 6 (six) hours as needed for wheezing or shortness of breath., Disp: , Rfl:    betamethasone dipropionate 0.05 % cream, Apply topically 2 (two) times daily., Disp: 30 g, Rfl: 1   cholecalciferol (VITAMIN D3) 25 MCG (1000 UNIT) tablet, Take 1,000 Units by mouth daily., Disp: , Rfl:    cimetidine (TAGAMET) 200 MG tablet, Take 400-600 mg by mouth daily as needed (for heartburn)., Disp: , Rfl:    clobetasol ointment (TEMOVATE) 0.05 %, Apply topically 2 (two) times daily as needed., Disp: , Rfl:    ferrous sulfate 325 (65 FE) MG tablet, Take 325 mg by mouth daily with breakfast., Disp: , Rfl:    fluticasone (FLONASE) 50 MCG/ACT nasal spray, Place 2 sprays into both nostrils daily as needed for rhinitis., Disp: , Rfl:    guaiFENesin (MUCINEX) 600 MG 12 hr tablet, Take 600 mg by mouth daily., Disp: , Rfl:    hydrochlorothiazide (HYDRODIURIL) 25 MG tablet, Take 12.5 mg by mouth daily., Disp: , Rfl:    HYDROcodone-acetaminophen (NORCO) 10-325 MG tablet, Take 1-2 tablets by mouth every 4 (four) hours as needed for moderate pain., Disp: 240 tablet, Rfl: 0   hydrOXYzine (VISTARIL) 25 MG capsule, Take 25-50 mg by mouth at bedtime as needed., Disp: , Rfl:    Menthol, Topical  Analgesic, (ICY HOT EX), Apply 1 Application topically daily as needed (pain)., Disp: , Rfl:    naproxen sodium (ALEVE) 220 MG tablet, Take 220 mg by mouth daily as needed (pain)., Disp: , Rfl:    Omega-3 Fatty Acids (FISH OIL) 1000 MG CAPS, Take 1,000 mg by mouth daily., Disp: , Rfl:    Oxycodone HCl 10 MG TABS, Take 10 mg by mouth 2 (two) times daily as needed (pain)., Disp: , Rfl:    pantoprazole (PROTONIX) 40 MG tablet, Take 40 mg by mouth See admin instructions. Take 40 mg daily, may take a second 40 mg dose as needed for heartburn, Disp: , Rfl:    PEMBROLIZUMAB IV, Inject into the vein every 21 ( twenty-one) days., Disp: , Rfl:    polyethylene glycol powder (GLYCOLAX/MIRALAX) 17 GM/SCOOP powder, Take 17 g by mouth daily as needed for mild constipation or moderate constipation., Disp: , Rfl:    potassium chloride SA (KLOR-CON M) 20 MEQ tablet, Take 1 tablet (20 mEq total) by mouth daily., Disp: 30 tablet, Rfl: 3   predniSONE (DELTASONE) 5 MG tablet, Take 1 tablet (5 mg total) by mouth daily with breakfast., Disp: 30 tablet, Rfl: 4   prochlorperazine (COMPAZINE) 10 MG tablet, Take 1 tablet (10 mg total) by mouth every 6 (six) hours as needed for nausea or vomiting., Disp: 30 tablet, Rfl: 1   rosuvastatin (CRESTOR) 40 MG tablet, Take 40 mg by mouth at bedtime., Disp: ,  Rfl:    tamsulosin (FLOMAX) 0.4 MG CAPS capsule, Take 1 capsule (0.4 mg total) by mouth daily., Disp: 30 capsule, Rfl: 3   triamcinolone cream (KENALOG) 0.1 %, Apply 1 Application topically daily as needed (tick bite/itching)., Disp: , Rfl:    triazolam (HALCION) 0.25 MG tablet, Take 0.25 mg by mouth at bedtime., Disp: , Rfl:    vitamin B-12 (CYANOCOBALAMIN) 1000 MCG tablet, Take 1,000 mcg by mouth every other day., Disp: , Rfl:    lidocaine-prilocaine (EMLA) cream, APPLY A SMALL AMOUNT TO PORT A CATH SITE AND COVER WITH PLASTIC WRAP 1 HOUR PRIOR TO INFUSION APPOINTMENTS (Patient not taking: Reported on 12/31/2022), Disp: 30 g, Rfl:  3 No current facility-administered medications for this visit.  Facility-Administered Medications Ordered in Other Visits:    sodium chloride flush (NS) 0.9 % injection 10 mL, 10 mL, Intracatheter, PRN, Doreatha Massed, MD, 10 mL at 10/07/22 1523   sodium chloride flush (NS) 0.9 % injection 10 mL, 10 mL, Intracatheter, PRN, Doreatha Massed, MD, 10 mL at 10/28/22 1446   sodium chloride flush (NS) 0.9 % injection 10 mL, 10 mL, Intracatheter, PRN, Doreatha Massed, MD, 10 mL at 11/19/22 1547   sodium chloride flush (NS) 0.9 % injection 10 mL, 10 mL, Intracatheter, PRN, Doreatha Massed, MD, 10 mL at 12/31/22 1321   Allergies: Allergies  Allergen Reactions   Fentanyl Other (See Comments)    CAUSED HALLUCINATIONS/05-03-13 pt reports it was oral fentanyl that caused hallucinations    Gabapentin Other (See Comments)    Bones throbbed, headaches, weakness   Nabumetone     Unknown reaction    Aspirin Nausea Only    REVIEW OF SYSTEMS:   Review of Systems  Constitutional:  Negative for chills, fatigue and fever.  HENT:   Negative for lump/mass, mouth sores, nosebleeds, sore throat and trouble swallowing.   Eyes:  Negative for eye problems.  Respiratory:  Negative for cough and shortness of breath.   Cardiovascular:  Negative for chest pain, leg swelling and palpitations.  Gastrointestinal:  Negative for abdominal pain, constipation, diarrhea, nausea and vomiting.  Genitourinary:  Negative for bladder incontinence, difficulty urinating, dysuria, frequency, hematuria and nocturia.   Musculoskeletal:  Negative for arthralgias, back pain, flank pain, myalgias and neck pain.  Skin:  Positive for itching and rash.  Neurological:  Negative for dizziness, headaches and numbness.  Hematological:  Does not bruise/bleed easily.  Psychiatric/Behavioral:  Negative for depression, sleep disturbance and suicidal ideas. The patient is not nervous/anxious.   All other systems reviewed and  are negative.    VITALS:   There were no vitals taken for this visit.  Wt Readings from Last 3 Encounters:  12/31/22 133 lb 14.4 oz (60.7 kg)  12/10/22 133 lb (60.3 kg)  11/19/22 131 lb 9.6 oz (59.7 kg)    There is no height or weight on file to calculate BMI.  Performance status (ECOG): 1 - Symptomatic but completely ambulatory  PHYSICAL EXAM:   Physical Exam Vitals and nursing note reviewed. Exam conducted with a chaperone present.  Constitutional:      Appearance: Normal appearance.  Cardiovascular:     Rate and Rhythm: Normal rate and regular rhythm.     Pulses: Normal pulses.     Heart sounds: Normal heart sounds.  Pulmonary:     Effort: Pulmonary effort is normal.     Breath sounds: Normal breath sounds.  Abdominal:     Palpations: Abdomen is soft. There is no hepatomegaly, splenomegaly or mass.  Tenderness: There is no abdominal tenderness.  Musculoskeletal:     Right lower leg: No edema.     Left lower leg: No edema.  Lymphadenopathy:     Cervical: No cervical adenopathy.     Right cervical: No superficial, deep or posterior cervical adenopathy.    Left cervical: No superficial, deep or posterior cervical adenopathy.     Upper Body:     Right upper body: No supraclavicular or axillary adenopathy.     Left upper body: No supraclavicular or axillary adenopathy.  Neurological:     General: No focal deficit present.     Mental Status: He is alert and oriented to person, place, and time.  Psychiatric:        Mood and Affect: Mood normal.        Behavior: Behavior normal.     LABS:      Latest Ref Rng & Units 12/31/2022    9:39 AM 12/10/2022    9:48 AM 11/19/2022   10:39 AM  CBC  WBC 4.0 - 10.5 K/uL 7.6  8.9  9.2   Hemoglobin 13.0 - 17.0 g/dL 91.4  78.2  95.6   Hematocrit 39.0 - 52.0 % 40.1  39.3  38.8   Platelets 150 - 400 K/uL 182  239  220       Latest Ref Rng & Units 12/31/2022    9:39 AM 12/10/2022    9:48 AM 11/19/2022   10:39 AM  CMP  Glucose  70 - 99 mg/dL 213  086  578   BUN 8 - 23 mg/dL 16  21  19    Creatinine 0.61 - 1.24 mg/dL 4.69  6.29  5.28   Sodium 135 - 145 mmol/L 135  133  133   Potassium 3.5 - 5.1 mmol/L 3.3  3.4  2.7   Chloride 98 - 111 mmol/L 101  101  98   CO2 22 - 32 mmol/L 24  24  23    Calcium 8.9 - 10.3 mg/dL 9.0  8.7  8.4   Total Protein 6.5 - 8.1 g/dL 6.9  7.0  6.8   Total Bilirubin 0.3 - 1.2 mg/dL 0.5  0.7  0.7   Alkaline Phos 38 - 126 U/L 80  87  93   AST 15 - 41 U/L 15  17  18    ALT 0 - 44 U/L 19  20  18       No results found for: "CEA1", "CEA" / No results found for: "CEA1", "CEA" No results found for: "PSA1" No results found for: "UXL244" No results found for: "CAN125"  No results found for: "TOTALPROTELP", "ALBUMINELP", "A1GS", "A2GS", "BETS", "BETA2SER", "GAMS", "MSPIKE", "SPEI" Lab Results  Component Value Date   TIBC 310 09/30/2022   TIBC 323 04/14/2022   TIBC 373 10/01/2021   FERRITIN 108 09/30/2022   FERRITIN 65 04/14/2022   FERRITIN 49 10/01/2021   IRONPCTSAT 13 (L) 09/30/2022   IRONPCTSAT 22 04/14/2022   IRONPCTSAT 27 10/01/2021   Lab Results  Component Value Date   LDH 120 06/28/2021   LDH 130 12/20/2020   LDH 97 (L) 07/12/2018     STUDIES:   No results found.

## 2022-12-31 ENCOUNTER — Inpatient Hospital Stay: Payer: Medicare Other

## 2022-12-31 ENCOUNTER — Inpatient Hospital Stay (HOSPITAL_BASED_OUTPATIENT_CLINIC_OR_DEPARTMENT_OTHER): Payer: Medicare Other | Admitting: Hematology

## 2022-12-31 VITALS — BP 122/64 | HR 66 | Temp 97.7°F | Resp 18

## 2022-12-31 DIAGNOSIS — Z95828 Presence of other vascular implants and grafts: Secondary | ICD-10-CM

## 2022-12-31 DIAGNOSIS — C3412 Malignant neoplasm of upper lobe, left bronchus or lung: Secondary | ICD-10-CM | POA: Diagnosis not present

## 2022-12-31 DIAGNOSIS — E876 Hypokalemia: Secondary | ICD-10-CM

## 2022-12-31 DIAGNOSIS — E538 Deficiency of other specified B group vitamins: Secondary | ICD-10-CM | POA: Diagnosis not present

## 2022-12-31 DIAGNOSIS — C7951 Secondary malignant neoplasm of bone: Secondary | ICD-10-CM | POA: Diagnosis not present

## 2022-12-31 DIAGNOSIS — Z87891 Personal history of nicotine dependence: Secondary | ICD-10-CM | POA: Diagnosis not present

## 2022-12-31 DIAGNOSIS — Z5112 Encounter for antineoplastic immunotherapy: Secondary | ICD-10-CM | POA: Diagnosis not present

## 2022-12-31 LAB — CBC WITH DIFFERENTIAL/PLATELET
Abs Immature Granulocytes: 0.05 10*3/uL (ref 0.00–0.07)
Basophils Absolute: 0.1 10*3/uL (ref 0.0–0.1)
Basophils Relative: 1 %
Eosinophils Absolute: 0.2 10*3/uL (ref 0.0–0.5)
Eosinophils Relative: 2 %
HCT: 40.1 % (ref 39.0–52.0)
Hemoglobin: 13.4 g/dL (ref 13.0–17.0)
Immature Granulocytes: 1 %
Lymphocytes Relative: 13 %
Lymphs Abs: 1 10*3/uL (ref 0.7–4.0)
MCH: 32.8 pg (ref 26.0–34.0)
MCHC: 33.4 g/dL (ref 30.0–36.0)
MCV: 98.3 fL (ref 80.0–100.0)
Monocytes Absolute: 0.5 10*3/uL (ref 0.1–1.0)
Monocytes Relative: 6 %
Neutro Abs: 5.9 10*3/uL (ref 1.7–7.7)
Neutrophils Relative %: 77 %
Platelets: 182 10*3/uL (ref 150–400)
RBC: 4.08 MIL/uL — ABNORMAL LOW (ref 4.22–5.81)
RDW: 13.2 % (ref 11.5–15.5)
WBC: 7.6 10*3/uL (ref 4.0–10.5)
nRBC: 0 % (ref 0.0–0.2)

## 2022-12-31 LAB — COMPREHENSIVE METABOLIC PANEL
ALT: 19 U/L (ref 0–44)
AST: 15 U/L (ref 15–41)
Albumin: 3.9 g/dL (ref 3.5–5.0)
Alkaline Phosphatase: 80 U/L (ref 38–126)
Anion gap: 10 (ref 5–15)
BUN: 16 mg/dL (ref 8–23)
CO2: 24 mmol/L (ref 22–32)
Calcium: 9 mg/dL (ref 8.9–10.3)
Chloride: 101 mmol/L (ref 98–111)
Creatinine, Ser: 0.77 mg/dL (ref 0.61–1.24)
GFR, Estimated: >60 mL/min (ref >60)
Glucose, Bld: 122 mg/dL — ABNORMAL HIGH (ref 70–99)
Potassium: 3.3 mmol/L — ABNORMAL LOW (ref 3.5–5.1)
Sodium: 135 mmol/L (ref 135–145)
Total Bilirubin: 0.5 mg/dL (ref 0.3–1.2)
Total Protein: 6.9 g/dL (ref 6.5–8.1)

## 2022-12-31 LAB — MAGNESIUM: Magnesium: 1.9 mg/dL (ref 1.7–2.4)

## 2022-12-31 MED ORDER — SODIUM CHLORIDE 0.9 % IV SOLN
200.0000 mg | Freq: Once | INTRAVENOUS | Status: AC
  Administered 2022-12-31: 200 mg via INTRAVENOUS
  Filled 2022-12-31: qty 8

## 2022-12-31 MED ORDER — SODIUM CHLORIDE 0.9% FLUSH
10.0000 mL | INTRAVENOUS | Status: DC | PRN
  Administered 2022-12-31: 10 mL

## 2022-12-31 MED ORDER — SODIUM CHLORIDE 0.9 % IV SOLN: Freq: Once | INTRAVENOUS | Status: AC

## 2022-12-31 MED ORDER — POTASSIUM CHLORIDE CRYS ER 20 MEQ PO TBCR
40.0000 meq | EXTENDED_RELEASE_TABLET | Freq: Once | ORAL | Status: AC
  Administered 2022-12-31: 40 meq via ORAL
  Filled 2022-12-31: qty 2

## 2022-12-31 MED ORDER — SODIUM CHLORIDE 0.9% FLUSH
10.0000 mL | Freq: Once | INTRAVENOUS | Status: AC
  Administered 2022-12-31: 10 mL via INTRAVENOUS

## 2022-12-31 MED ORDER — HEPARIN SOD (PORK) LOCK FLUSH 100 UNIT/ML IV SOLN
500.0000 [IU] | Freq: Once | INTRAVENOUS | Status: AC | PRN
  Administered 2022-12-31: 500 [IU]

## 2022-12-31 NOTE — Progress Notes (Signed)
Patient presents today for Keytruda infusion and follow up with Dr. Ellin Saba. Vital signs within parameters for treatment. Labs within parameters for treatment. Potassium 3.3 today.  40 mEq of K-dur po x 1 dose standing order.

## 2022-12-31 NOTE — Progress Notes (Signed)
Patient presents today for Keytruda infusion. Patient is in satisfactory condition with no new complaints voiced.  Vital signs are stable.  Labs reviewed by Dr. Ellin Saba during the office visit and all labs are within treatment parameters.  We will proceed with treatment per MD orders.   Potassium today is 3.3.  We will give patient Klor Con 40 mEq PO x one dose today per standing orders by Dr. Ellin Saba.   Patient tolerated treatment well with no complaints voiced.  Patient left ambulatory with wife in stable condition.  Vital signs stable at discharge.  Follow up as scheduled.

## 2022-12-31 NOTE — Patient Instructions (Signed)
[facility]   Discharge Instructions: Thank you for choosing [facility] to provide your oncology and hematology care.  If you have a lab appointment with the Cancer Center - please note that after April 8th, 2024, all labs will be drawn in the cancer center.  You do not have to check in or register with the main entrance as you have in the past but will complete your check-in in the cancer center.  Wear comfortable clothing and clothing appropriate for easy access to any Portacath or PICC line.   We strive to give you quality time with your provider. You may need to reschedule your appointment if you arrive late (15 or more minutes).  Arriving late affects you and other patients whose appointments are after yours.  Also, if you miss three or more appointments without notifying the office, you may be dismissed from the clinic at the provider's discretion.      For prescription refill requests, have your pharmacy contact our office and allow 72 hours for refills to be completed.    Today you received the following chemotherapy and/or immunotherapy agents Keytruda.  Pembrolizumab Injection What is this medication? PEMBROLIZUMAB (PEM broe LIZ ue mab) treats some types of cancer. It works by helping your immune system slow or stop the spread of cancer cells. It is a monoclonal antibody. This medicine may be used for other purposes; ask your health care provider or pharmacist if you have questions. COMMON BRAND NAME(S): Keytruda What should I tell my care team before I take this medication? They need to know if you have any of these conditions: Allogeneic stem cell transplant (uses someone else's stem cells) Autoimmune diseases, such as Crohn disease, ulcerative colitis, lupus History of chest radiation Nervous system problems, such as Guillain-Barre syndrome, myasthenia gravis Organ transplant An unusual or allergic reaction to pembrolizumab, other medications,  foods, dyes, or preservatives Pregnant or trying to get pregnant Breast-feeding How should I use this medication? This medication is injected into a vein. It is given by your care team in a hospital or clinic setting. A special MedGuide will be given to you before each treatment. Be sure to read this information carefully each time. Talk to your care team about the use of this medication in children. While it may be prescribed for children as young as 6 months for selected conditions, precautions do apply. Overdosage: If you think you have taken too much of this medicine contact a poison control center or emergency room at once. NOTE: This medicine is only for you. Do not share this medicine with others. What if I miss a dose? Keep appointments for follow-up doses. It is important not to miss your dose. Call your care team if you are unable to keep an appointment. What may interact with this medication? Interactions have not been studied. This list may not describe all possible interactions. Give your health care provider a list of all the medicines, herbs, non-prescription drugs, or dietary supplements you use. Also tell them if you smoke, drink alcohol, or use illegal drugs. Some items may interact with your medicine. What should I watch for while using this medication? Your condition will be monitored carefully while you are receiving this medication. You may need blood work while taking this medication. This medication may cause serious skin reactions. They can happen weeks to months after starting the medication. Contact your care team right away if you notice fevers or flu-like symptoms with a rash. The   rash may be red or purple and then turn into blisters or peeling of the skin. You may also notice a red rash with swelling of the face, lips, or lymph nodes in your neck or under your arms. Tell your care team right away if you have any change in your eyesight. Talk to your care team if you  may be pregnant. Serious birth defects can occur if you take this medication during pregnancy and for 4 months after the last dose. You will need a negative pregnancy test before starting this medication. Contraception is recommended while taking this medication and for 4 months after the last dose. Your care team can help you find the option that works for you. Do not breastfeed while taking this medication and for 4 months after the last dose. What side effects may I notice from receiving this medication? Side effects that you should report to your care team as soon as possible: Allergic reactions--skin rash, itching, hives, swelling of the face, lips, tongue, or throat Dry cough, shortness of breath or trouble breathing Eye pain, redness, irritation, or discharge with blurry or decreased vision Heart muscle inflammation--unusual weakness or fatigue, shortness of breath, chest pain, fast or irregular heartbeat, dizziness, swelling of the ankles, feet, or hands Hormone gland problems--headache, sensitivity to light, unusual weakness or fatigue, dizziness, fast or irregular heartbeat, increased sensitivity to cold or heat, excessive sweating, constipation, hair loss, increased thirst or amount of urine, tremors or shaking, irritability Infusion reactions--chest pain, shortness of breath or trouble breathing, feeling faint or lightheaded Kidney injury (glomerulonephritis)--decrease in the amount of urine, red or dark Shahd Occhipinti urine, foamy or bubbly urine, swelling of the ankles, hands, or feet Liver injury--right upper belly pain, loss of appetite, nausea, light-colored stool, dark yellow or Alexxander Kurt urine, yellowing skin or eyes, unusual weakness or fatigue Pain, tingling, or numbness in the hands or feet, muscle weakness, change in vision, confusion or trouble speaking, loss of balance or coordination, trouble walking, seizures Rash, fever, and swollen lymph nodes Redness, blistering, peeling, or loosening  of the skin, including inside the mouth Sudden or severe stomach pain, bloody diarrhea, fever, nausea, vomiting Side effects that usually do not require medical attention (report to your care team if they continue or are bothersome): Bone, joint, or muscle pain Diarrhea Fatigue Loss of appetite Nausea Skin rash This list may not describe all possible side effects. Call your doctor for medical advice about side effects. You may report side effects to FDA at 1-800-FDA-1088. Where should I keep my medication? This medication is given in a hospital or clinic. It will not be stored at home. NOTE: This sheet is a summary. It may not cover all possible information. If you have questions about this medicine, talk to your doctor, pharmacist, or health care provider.  2023 Elsevier/Gold Standard (2022-01-07 00:00:00)       To help prevent nausea and vomiting after your treatment, we encourage you to take your nausea medication as directed.  BELOW ARE SYMPTOMS THAT SHOULD BE REPORTED IMMEDIATELY: *FEVER GREATER THAN 100.4 F (38 C) OR HIGHER *CHILLS OR SWEATING *NAUSEA AND VOMITING THAT IS NOT CONTROLLED WITH YOUR NAUSEA MEDICATION *UNUSUAL SHORTNESS OF BREATH *UNUSUAL BRUISING OR BLEEDING *URINARY PROBLEMS (pain or burning when urinating, or frequent urination) *BOWEL PROBLEMS (unusual diarrhea, constipation, pain near the anus) TENDERNESS IN MOUTH AND THROAT WITH OR WITHOUT PRESENCE OF ULCERS (sore throat, sores in mouth, or a toothache) UNUSUAL RASH, SWELLING OR PAIN  UNUSUAL VAGINAL DISCHARGE OR ITCHING     Items with * indicate a potential emergency and should be followed up as soon as possible or go to the Emergency Department if any problems should occur.  Please show the CHEMOTHERAPY ALERT CARD or IMMUNOTHERAPY ALERT CARD at check-in to the Emergency Department and triage nurse.  Should you have questions after your visit or need to cancel or reschedule your appointment, please contact  [facility] AT  [phone]  and follow the prompts.  Office hours are 8:00 a.m. to 4:30 p.m. Monday - Friday. Please note that voicemails left after 4:00 p.m. may not be returned until the following business day.  We are closed weekends and major holidays. You have access to a nurse at all times for urgent questions. Please call the main number to the clinic [phone] and follow the prompts.  For any non-urgent questions, you may also contact your provider using MyChart. We now offer e-Visits for anyone 18 and older to request care online for non-urgent symptoms. For details visit [email or web address].   Also download the MyChart app! Go to the app store, search "MyChart", open the app, select [facility], and log in with your MyChart username and password.   

## 2022-12-31 NOTE — Progress Notes (Signed)
Patient has been examined by Dr. Katragadda. Vital signs and labs have been reviewed by MD - ANC, Creatinine, LFTs, hemoglobin, and platelets are within treatment parameters per M.D. - pt may proceed with treatment.  Primary RN and pharmacy notified.  

## 2022-12-31 NOTE — Patient Instructions (Signed)
[facility] Discharge Instructions   You were seen and examined today by Dr. Katragadda.  He reviewed the results of your lab work which are normal/stable.   We will proceed with your treatment today.  Return as scheduled.    Thank you for choosing [facility] at [facility] to provide your oncology and hematology care.  To afford each patient quality time with our provider, please arrive at least 15 minutes before your scheduled appointment time.   If you have a lab appointment with the Cancer Center please come in thru the Main Entrance and check in at the main information desk.  You need to re-schedule your appointment should you arrive 10 or more minutes late.  We strive to give you quality time with our providers, and arriving late affects you and other patients whose appointments are after yours.  Also, if you no show three or more times for appointments you may be dismissed from the clinic at the providers discretion.     Again, thank you for choosing [facility].  Our hope is that these requests will decrease the amount of time that you wait before being seen by our physicians.       _____________________________________________________________  Should you have questions after your visit to [facility], please contact our office at [phone] and follow the prompts.  Our office hours are 8:00 a.m. and 4:30 p.m. Monday - Friday.  Please note that voicemails left after 4:00 p.m. may not be returned until the following business day.  We are closed weekends and major holidays.  You do have access to a nurse 24-7, just call the main number to the clinic [phone] and do not press any options, hold on the line and a nurse will answer the phone.    For prescription refill requests, have your pharmacy contact our office and allow 72 hours.    Due to Covid, you will need to wear a mask upon entering  the hospital. If you do not have a mask, a mask will be given to you at the Main Entrance upon arrival. For doctor visits, patients may have 1 support person age 18 or older with them. For treatment visits, patients can not have anyone with them due to social distancing guidelines and our immunocompromised population.      

## 2023-01-06 ENCOUNTER — Other Ambulatory Visit: Payer: Self-pay

## 2023-01-06 ENCOUNTER — Other Ambulatory Visit: Payer: Self-pay | Admitting: Hematology

## 2023-01-11 ENCOUNTER — Other Ambulatory Visit: Payer: Self-pay

## 2023-01-14 DIAGNOSIS — M48062 Spinal stenosis, lumbar region with neurogenic claudication: Secondary | ICD-10-CM | POA: Diagnosis not present

## 2023-01-14 DIAGNOSIS — J449 Chronic obstructive pulmonary disease, unspecified: Secondary | ICD-10-CM | POA: Diagnosis not present

## 2023-01-14 DIAGNOSIS — M1991 Primary osteoarthritis, unspecified site: Secondary | ICD-10-CM | POA: Diagnosis not present

## 2023-01-14 DIAGNOSIS — Z6822 Body mass index (BMI) 22.0-22.9, adult: Secondary | ICD-10-CM | POA: Diagnosis not present

## 2023-01-14 DIAGNOSIS — C3432 Malignant neoplasm of lower lobe, left bronchus or lung: Secondary | ICD-10-CM | POA: Diagnosis not present

## 2023-01-14 DIAGNOSIS — C3490 Malignant neoplasm of unspecified part of unspecified bronchus or lung: Secondary | ICD-10-CM | POA: Diagnosis not present

## 2023-01-14 DIAGNOSIS — I10 Essential (primary) hypertension: Secondary | ICD-10-CM | POA: Diagnosis not present

## 2023-01-15 ENCOUNTER — Encounter (HOSPITAL_COMMUNITY)
Admission: RE | Admit: 2023-01-15 | Discharge: 2023-01-15 | Disposition: A | Discharge status: Home / Self Care | Payer: Medicare Other | Source: Ambulatory Visit | Attending: Hematology | Admitting: Hematology

## 2023-01-15 DIAGNOSIS — Z85118 Personal history of other malignant neoplasm of bronchus and lung: Secondary | ICD-10-CM | POA: Diagnosis not present

## 2023-01-15 DIAGNOSIS — C3412 Malignant neoplasm of upper lobe, left bronchus or lung: Secondary | ICD-10-CM | POA: Diagnosis not present

## 2023-01-15 MED ORDER — FLUDEOXYGLUCOSE F - 18 (FDG) INJECTION
7.1800 | Freq: Once | INTRAVENOUS | Status: AC | PRN
  Administered 2023-01-15: 7.18 via INTRAVENOUS

## 2023-01-20 NOTE — Progress Notes (Signed)
[facility] [address] [place] [address]    Clinic Day:  01/21/2023  Referring physician: Elfredia Nevins, MD  Patient Care Team: Elfredia Nevins, MD as PCP - General (Internal Medicine) Marykay Lex, MD as PCP - Cardiology (Cardiology) Malissa Hippo, MD (Inactive) as Consulting Physician (Gastroenterology) Lonie Peak, MD as Attending Physician (Radiation Oncology)   ASSESSMENT & PLAN:   Assessment: 1.  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 - Status post right upper lobectomy in February 2012 for right lung cancer. - LDCT chest (04/14/2022): Enlarging spiculated nodular soft tissue thickening in the posterior left upper lobe measuring 11.4 mm. - PET scan (04/24/2022): Left upper lobe lung nodule 14 mm with SUV 3.62.  Hypermetabolic focus in the inferior aspect of the right scapula without discrete CT correlate with SUV 5.71.  No hypermetabolic adenopathy. - LUL lesion FNA (06/03/2022): Adenocarcinoma - Right scapula biopsy (07/03/2022) adenocarcinoma consistent with lung primary - NGS: PD-L1 22 C3 TPS 100%, T p53 pathogenic variant, MSI-stable, TMB-low, no other targetable mutations - Cycle 1 of pembrolizumab started on 07/21/2022 - XRT to the right scapular lesion and left upper lobe lung lesion 5 fractions completed on 08/18/2022.   2.  Cystic lesion of left kidney - CTA PE on 04/14/2022 showed complex cystic lesion of the left kidney.  PET scan also showed exophytic 18 mm left renal lesion suspicious for malignancy. - MRI abdomen on 05/16/2022 showed benign appearing Bosniak category 1 and 2 left renal cyst with no evidence of neoplasm.  No further work-up needed.    Plan: 1.  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 - Labs today: Normal  LFTs and creatinine.  CBC grossly normal.  Elevated white count most likely from prednisone.  TSH is 1.2. - PET scan (01/15/2023): No evidence of new or progressive hypermetabolic disease.  Low-level FDG uptake at the site of healing sclerotic pathologic fracture in the inferior right scapula decreased. - He will proceed with treatment today.  RTC 3 weeks for follow-up.   2.  Left posterior chest wall rash: - He is taking prednisone 5 mg daily.  Erythematous maculopapular rash on lateral chest wall and anterior abdominal wall on both sides gets worse after each treatment.  For some reason it has gotten worse after the PET scan dye was injected.  He is also using Atarax as needed. - Recommend that he follow-up with dermatology.  He was previously seen by them.   3.  B12 deficiency - Continue B12 every other day and iron tablet daily.   4.  Hypokalemia: - Continue K-Dur 20 milligrams daily.  Potassium is 3.6 today.     Orders Placed This Encounter  Procedures   Magnesium    Standing Status:   Future    Standing Expiration Date:   04/14/2024   CBC with Differential    Standing Status:   Future    Standing Expiration Date:   04/14/2024   Comprehensive metabolic panel    Standing Status:   Future    Standing Expiration Date:   04/14/2024   Magnesium    Standing Status:   Future    Standing Expiration Date:   05/05/2024   CBC with Differential    Standing Status:   Future  Standing Expiration Date:   05/05/2024   Comprehensive metabolic panel    Standing Status:   Future    Standing Expiration Date:   05/05/2024   Magnesium    Standing Status:   Future    Standing Expiration Date:   05/26/2024   CBC with Differential    Standing Status:   Future    Standing Expiration Date:   05/26/2024   Comprehensive metabolic panel    Standing Status:   Future    Standing Expiration Date:   05/26/2024   TSH    Standing Status:   Future    Standing Expiration Date:   05/26/2024   Magnesium    Standing  Status:   Future    Standing Expiration Date:   06/16/2024   CBC with Differential    Standing Status:   Future    Standing Expiration Date:   06/16/2024   Comprehensive metabolic panel    Standing Status:   Future    Standing Expiration Date:   06/16/2024      I,Katie Daubenspeck,acting as a scribe for Doreatha Massed, MD.,have documented all relevant documentation on the behalf of Doreatha Massed, MD,as directed by  Doreatha Massed, MD while in the presence of Doreatha Massed, MD.   I, Doreatha Massed MD, have reviewed the above documentation for accuracy and completeness, and I agree with the above.   Doreatha Massed, MD   5/15/20244:32 PM  CHIEF COMPLAINT:   Diagnosis: bilateral lung cancers, normocytic anemia, and B12 deficiency    Cancer Staging  Adenocarcinoma of lung (HCC) Staging form: Lung, AJCC 7th Edition - Clinical: Stage IA (T1a, N0, M0) - Signed by [name], PA on 05/06/2011  Adenocarcinoma of upper lobe of left lung [facility]) Staging form: Lung, AJCC 8th Edition - Clinical stage from 07/16/2022: Stage IVA (cT1b, cN0, pM1b) - Unsigned    Prior Therapy: 1. Right upper lobectomy (February 2012) for right lung cancer 2. SBRT (November 2015) for stage I left adenocarcinoma  Current Therapy:  Pembrolizumab    HISTORY OF PRESENT ILLNESS:   Oncology History  Adenocarcinoma of lung (HCC)  09/30/2010 Cancer Staging   CT of chest showed spiculated lesion   10/15/2010 Surgery   Right upper lobectomy by Dr. Edwyna Shell   10/16/2010 Remission     10/24/2013 Imaging   CT Chest-  Enlarging subpleural nodule in the left lower lobe, now 9 mm, with distortion of the overlying pleura. Finding is concerning for a small bronchogenic carcinoma   01/23/2014 Imaging   CT Chest- Left lower lobe nodule has enlarged from 11/01/2012 and is worrisome for primary bronchogenic carcinoma.    07/04/2014 - 07/10/2014 Radiation Therapy   SBRT by Dr. Basilio Cairo in 3  fractions.   07/05/2015 Imaging   perifissural nodule in LLL appears slightly smaller with parenchymal opacity attrib to XRT, likely inflamm pathcy airspace dz more inferiorly in LLL, airspace dz in RLL resolved. no metastatic disease   Primary cancer of left lower lobe of lung (HCC)  06/18/2014 Initial Diagnosis   Left lower lobe nodule suspicious for primary lung carcinoma   07/04/2014 - 07/10/2014 Radiation Therapy   SBRT   10/12/2014 Imaging   No acute findings within the chest. Decrease in size of left lower lobe perifissural nodule compatible with response to therapy. No new or progressive disease identified within the chest.   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)  07/16/2022 Initial Diagnosis   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)  07/21/2022 -  Chemotherapy   Patient is on Treatment Plan : LUNG NSCLC Pembrolizumab (200) q21d        INTERVAL HISTORY:   Maurice Orozco is a 77 y.o. male presenting to clinic today for follow up of bilateral lung cancers, normocytic anemia, and B12 deficiency. He was last seen by me on 12/31/22.  Since his last visit, he underwent restaging PET scan on 01/15/23 showing: no evidence of new or progressive hypermetabolic metastatic disease.  Today, he states that he is doing well overall. His appetite level is at 100%. His energy level is at 100%.  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 dense RCA calcification --Non-ischemic  Myoview   History of kidney stones    in the 70's   History of radiation therapy 07/04/14, 07/06/14, 07/10/14   LLL lung nodule/54 Gy/3 fx- SBRT   Hypercholesterolemia 05/06/2011   Pneumonia due to Streptococcus    HX. POST OPERATIVELY   Port-A-Cath in place 07/21/2022   Pre-diabetes    Stress fx pelvis    DUE TO AA IN 1970    Surgical History: Past Surgical History:  Procedure Laterality Date   BACK SURGERY  07/04/2017   fusion above previous surgery   BACK SURGERY  2011   metal plate Z-6,X0   BRONCHIAL BIOPSY  06/03/2022   Procedure: BRONCHIAL BIOPSIES;  Surgeon: Josephine Igo, DO;  Location: [facility];  Service: Pulmonary;;   BRONCHIAL BRUSHINGS  06/03/2022   Procedure: BRONCHIAL BRUSHINGS;  Surgeon: Josephine Igo, DO;  Location: [facility];  Service: Pulmonary;;   BRONCHIAL NEEDLE ASPIRATION BIOPSY  06/03/2022   Procedure: BRONCHIAL NEEDLE ASPIRATION BIOPSIES;  Surgeon: Josephine Igo, DO;  Location: [facility];  Service: Pulmonary;;   Coroanry Calcium Score  03/2017   1043. Noted especially in RCA. Dense calcification, recommend Myoview over Cor CTA.   FIDUCIAL MARKER PLACEMENT  06/03/2022   Procedure: FIDUCIAL MARKER PLACEMENT;  Surgeon: Josephine Igo, DO;  Location: [facility];  Service: Pulmonary;;   IR IMAGING GUIDED PORT INSERTION  08/04/2022   LIPOMA EXCISION N/A 10/14/2017   Procedure: EXCISION LIPOMA ON BACK;  Surgeon: Lucretia Roers, MD;  Location: [facility];  Service: General;  Laterality: N/A;   LIPOMA EXCISION Left 11/19/2018   Procedure: MINOR EXCISION OF SEBACEOUS CYST NECK;  Surgeon: Lucretia Roers, MD;  Location: [facility];  Service: General;  Laterality: Left;  pt knows to arrive at 7:00   LUNG LOBECTOMY  2012   RT UPPER LOBE   NM MYOVIEW LTD  04/2017   EF 60%. Hypertensive response to exercise. (6:21 min, 7.7 METs) No EKG changes. LOW RISK    Social History: Social History   Socioeconomic History   Marital status: Married    Spouse  name: Not on file   Number of children: 3   Years of education: Not on file  Highest education level: Not on file  Occupational History   Not on file  Tobacco Use   Smoking status: Former    Packs/day: 1.50    Years: 59.00    Additional pack years: 0.00    Total pack years: 88.50    Types: Cigarettes    Quit date: 09/08/2010    Years since quitting: 12.3   Smokeless tobacco: Current    Types: Chew   Tobacco comments:    still chews tobacco, he tells me he hasn't in the past 2 days.   Vaping Use   Vaping Use: Never used  Substance and Sexual Activity   Alcohol use: No   Drug use: No   Sexual activity: Not Currently  Other Topics Concern   Not on file  Social History Narrative   Not on file   Social Determinants of Health   Financial Resource Strain: Not on file  Food Insecurity: Not on file  Transportation Needs: No Transportation Needs (07/13/2018)   PRAPARE - Administrator, Civil Service (Medical): No    Lack of Transportation (Non-Medical): No  Physical Activity: Not on file  Stress: Not on file  Social Connections: Not on file  Intimate Partner Violence: Not At Risk (07/13/2018)   Humiliation, Afraid, Rape, and Kick questionnaire    Fear of Current or Ex-Partner: No    Emotionally Abused: No    Physically Abused: No    Sexually Abused: No    Family History: Family History  Problem Relation Age of Onset   Hypertension Mother    Kidney disease Father    Heart attack Brother    Cancer Maternal Uncle        prostate cancer   Cancer Maternal Uncle        prostate cancer   Cancer Maternal Uncle        prostate cancer   Cancer Maternal Uncle        prostate cancer    Current Medications:  Current Outpatient Medications:    albuterol (PROVENTIL HFA;VENTOLIN HFA) 108 (90 Base) MCG/ACT inhaler, Inhale 2 puffs into the lungs every 4 (four) hours as needed for wheezing or shortness of breath., Disp: 2 Inhaler, Rfl: 3   albuterol (PROVENTIL) (2.5  MG/3ML) 0.083% nebulizer solution, Take 2.5 mg by nebulization every 6 (six) hours as needed for wheezing or shortness of breath., Disp: , Rfl:    betamethasone dipropionate 0.05 % cream, Apply topically 2 (two) times daily., Disp: 30 g, Rfl: 1   cholecalciferol (VITAMIN D3) 25 MCG (1000 UNIT) tablet, Take 1,000 Units by mouth daily., Disp: , Rfl:    cimetidine (TAGAMET) 200 MG tablet, Take 400-600 mg by mouth daily as needed (for heartburn)., Disp: , Rfl:    clobetasol ointment (TEMOVATE) 0.05 %, Apply topically 2 (two) times daily as needed., Disp: , Rfl:    ferrous sulfate 325 (65 FE) MG tablet, Take 325 mg by mouth daily with breakfast., Disp: , Rfl:    fluticasone (FLONASE) 50 MCG/ACT nasal spray, Place 2 sprays into both nostrils daily as needed for rhinitis., Disp: , Rfl:    guaiFENesin (MUCINEX) 600 MG 12 hr tablet, Take 600 mg by mouth daily., Disp: , Rfl:    hydrochlorothiazide (HYDRODIURIL) 25 MG tablet, Take 12.5 mg by mouth daily., Disp: , Rfl:    HYDROcodone-acetaminophen (NORCO) 10-325 MG tablet, Take 1-2 tablets by mouth every 4 (four) hours as needed for moderate pain., Disp: 240 tablet, Rfl: 0   hydrOXYzine (VISTARIL)  25 MG capsule, Take 25-50 mg by mouth at bedtime as needed., Disp: , Rfl:    KLOR-CON M20 20 MEQ tablet, TAKE 1 TABLET BY MOUTH EVERY DAY, Disp: 90 tablet, Rfl: 1   lidocaine-prilocaine (EMLA) cream, APPLY A SMALL AMOUNT TO PORT A CATH SITE AND COVER WITH PLASTIC WRAP 1 HOUR PRIOR TO INFUSION APPOINTMENTS, Disp: 30 g, Rfl: 3   Menthol, Topical Analgesic, (ICY HOT EX), Apply 1 Application topically daily as needed (pain)., Disp: , Rfl:    naproxen sodium (ALEVE) 220 MG tablet, Take 220 mg by mouth daily as needed (pain)., Disp: , Rfl:    Omega-3 Fatty Acids (FISH OIL) 1000 MG CAPS, Take 1,000 mg by mouth daily., Disp: , Rfl:    Oxycodone HCl 10 MG TABS, Take 10 mg by mouth 2 (two) times daily as needed (pain)., Disp: , Rfl:    pantoprazole (PROTONIX) 40 MG tablet, Take  40 mg by mouth See admin instructions. Take 40 mg daily, may take a second 40 mg dose as needed for heartburn, Disp: , Rfl:    PEMBROLIZUMAB IV, Inject into the vein every 21 ( twenty-one) days., Disp: , Rfl:    polyethylene glycol powder (GLYCOLAX/MIRALAX) 17 GM/SCOOP powder, Take 17 g by mouth daily as needed for mild constipation or moderate constipation., Disp: , Rfl:    predniSONE (DELTASONE) 5 MG tablet, Take 1 tablet (5 mg total) by mouth daily with breakfast., Disp: 30 tablet, Rfl: 4   prochlorperazine (COMPAZINE) 10 MG tablet, Take 1 tablet (10 mg total) by mouth every 6 (six) hours as needed for nausea or vomiting., Disp: 30 tablet, Rfl: 1   rosuvastatin (CRESTOR) 40 MG tablet, Take 40 mg by mouth at bedtime., Disp: , Rfl:    tamsulosin (FLOMAX) 0.4 MG CAPS capsule, Take 1 capsule (0.4 mg total) by mouth daily., Disp: 30 capsule, Rfl: 3   triamcinolone cream (KENALOG) 0.1 %, Apply 1 Application topically daily as needed (tick bite/itching)., Disp: , Rfl:    triazolam (HALCION) 0.25 MG tablet, Take 0.25 mg by mouth at bedtime., Disp: , Rfl:    vitamin B-12 (CYANOCOBALAMIN) 1000 MCG tablet, Take 1,000 mcg by mouth every other day., Disp: , Rfl:  No current facility-administered medications for this visit.  Facility-Administered Medications Ordered in Other Visits:    sodium chloride flush (NS) 0.9 % injection 10 mL, 10 mL, Intracatheter, PRN, Doreatha Massed, MD, 10 mL at 10/07/22 1523   sodium chloride flush (NS) 0.9 % injection 10 mL, 10 mL, Intracatheter, PRN, Doreatha Massed, MD, 10 mL at 10/28/22 1446   sodium chloride flush (NS) 0.9 % injection 10 mL, 10 mL, Intracatheter, PRN, Doreatha Massed, MD, 10 mL at 11/19/22 1547   sodium chloride flush (NS) 0.9 % injection 10 mL, 10 mL, Intracatheter, PRN, Doreatha Massed, MD, 10 mL at 01/21/23 1518   Allergies: Allergies  Allergen Reactions   Fentanyl Other (See Comments)    CAUSED HALLUCINATIONS/05-03-13 pt reports it  was oral fentanyl that caused hallucinations    Gabapentin Other (See Comments)    Bones throbbed, headaches, weakness   Nabumetone     Unknown reaction    Aspirin Nausea Only    REVIEW OF SYSTEMS:   Review of Systems  Constitutional:  Negative for chills, fatigue and fever.  HENT:   Negative for lump/mass, mouth sores, nosebleeds, sore throat and trouble swallowing.   Eyes:  Negative for eye problems.  Respiratory:  Positive for shortness of breath. Negative for cough.   Cardiovascular:  Negative for chest pain, leg swelling and palpitations.  Gastrointestinal:  Negative for abdominal pain, constipation, diarrhea, nausea and vomiting.  Genitourinary:  Negative for bladder incontinence, difficulty urinating, dysuria, frequency, hematuria and nocturia.   Musculoskeletal:  Negative for arthralgias, back pain, flank pain, myalgias and neck pain.  Skin:  Positive for itching and rash.  Neurological:  Negative for dizziness, headaches and numbness.  Hematological:  Does not bruise/bleed easily.  Psychiatric/Behavioral:  Negative for depression, sleep disturbance and suicidal ideas. The patient is not nervous/anxious.   All other systems reviewed and are negative.    VITALS:   There were no vitals taken for this visit.  Wt Readings from Last 3 Encounters:  01/21/23 142 lb 9.6 oz (64.7 kg)  12/31/22 133 lb 14.4 oz (60.7 kg)  12/10/22 133 lb (60.3 kg)    There is no height or weight on file to calculate BMI.  Performance status (ECOG): 1 - Symptomatic but completely ambulatory  PHYSICAL EXAM:   Physical Exam Vitals and nursing note reviewed. Exam conducted with a chaperone present.  Constitutional:      Appearance: Normal appearance.  Cardiovascular:     Rate and Rhythm: Normal rate and regular rhythm.     Pulses: Normal pulses.     Heart sounds: Normal heart sounds.  Pulmonary:     Effort: Pulmonary effort is normal.     Breath sounds: Normal breath sounds.  Abdominal:      Palpations: Abdomen is soft. There is no hepatomegaly, splenomegaly or mass.     Tenderness: There is no abdominal tenderness.  Musculoskeletal:     Right lower leg: No edema.     Left lower leg: No edema.  Lymphadenopathy:     Cervical: No cervical adenopathy.     Right cervical: No superficial, deep or posterior cervical adenopathy.    Left cervical: No superficial, deep or posterior cervical adenopathy.     Upper Body:     Right upper body: No supraclavicular or axillary adenopathy.     Left upper body: No supraclavicular or axillary adenopathy.  Neurological:     General: No focal deficit present.     Mental Status: He is alert and oriented to person, place, and time.  Psychiatric:        Mood and Affect: Mood normal.        Behavior: Behavior normal.     LABS:      Latest Ref Rng & Units 01/21/2023   11:45 AM 12/31/2022    9:39 AM 12/10/2022    9:48 AM  CBC  WBC 4.0 - 10.5 K/uL 12.3  7.6  8.9   Hemoglobin 13.0 - 17.0 g/dL 16.1  09.6  04.5   Hematocrit 39.0 - 52.0 % 39.3  40.1  39.3   Platelets 150 - 400 K/uL 197  182  239       Latest Ref Rng & Units 01/21/2023   11:45 AM 12/31/2022    9:39 AM 12/10/2022    9:48 AM  CMP  Glucose 70 - 99 mg/dL 409  811  914   BUN 8 - 23 mg/dL 19  16  21    Creatinine 0.61 - 1.24 mg/dL 7.82  9.56  2.13   Sodium 135 - 145 mmol/L 135  135  133   Potassium 3.5 - 5.1 mmol/L 3.6  3.3  3.4   Chloride 98 - 111 mmol/L 102  101  101   CO2 22 - 32 mmol/L 25  24  24   Calcium 8.9 - 10.3 mg/dL 8.7  9.0  8.7   Total Protein 6.5 - 8.1 g/dL 6.5  6.9  7.0   Total Bilirubin 0.3 - 1.2 mg/dL 0.7  0.5  0.7   Alkaline Phos 38 - 126 U/L 83  80  87   AST 15 - 41 U/L 19  15  17    ALT 0 - 44 U/L 25  19  20       No results found for: "CEA1", "CEA" / No results found for: "CEA1", "CEA" No results found for: "PSA1" No results found for: "NUU725" No results found for: "CAN125"  No results found for: "TOTALPROTELP", "ALBUMINELP", "A1GS", "A2GS", "BETS",  "BETA2SER", "GAMS", "MSPIKE", "SPEI" Lab Results  Component Value Date   TIBC 310 09/30/2022   TIBC 323 04/14/2022   TIBC 373 10/01/2021   FERRITIN 108 09/30/2022   FERRITIN 65 04/14/2022   FERRITIN 49 10/01/2021   IRONPCTSAT 13 (L) 09/30/2022   IRONPCTSAT 22 04/14/2022   IRONPCTSAT 27 10/01/2021   Lab Results  Component Value Date   LDH 120 06/28/2021   LDH 130 12/20/2020   LDH 97 (L) 07/12/2018     STUDIES:   NM PET Image Restag (PS) Skull Base To Thigh  Result Date: 01/19/2023 CLINICAL DATA:  Subsequent treatment strategy for stage IV left upper lobe lung adenocarcinoma metastatic to the right scapula diagnosed September-October 2023 status post radiation therapy to the right scapula completed 08/18/2022 with ongoing immunotherapy. Additional history of right upper lobectomy for lung cancer in 2012 and radiation therapy to left lung adenocarcinoma in 2015. EXAM: NUCLEAR MEDICINE PET SKULL BASE TO THIGH TECHNIQUE: 7.2 mCi F-18 FDG was injected intravenously. Full-ring PET imaging was performed from the skull base to thigh after the radiotracer. CT data was obtained and used for attenuation correction and anatomic localization. Fasting blood glucose: 93 mg/dl COMPARISON:  36/64/4034 PET-CT. FINDINGS: Mediastinal blood pool activity: SUV max 1.9 Liver activity: SUV max NA NECK: No hypermetabolic lymph nodes in the neck. Incidental CT findings: Right internal jugular Port-A-Cath terminates at the cavoatrial junction. CHEST: No enlarged or hypermetabolic axillary, mediastinal or hilar lymph nodes. No hypermetabolic pulmonary findings. Posterior left upper lobe 0.8 cm nodular focus of consolidation adjacent to fiducial marker (series 7/image 24) without associated FDG uptake, slightly decreased from 1.0 cm on 10/23/2022 PET-CT. Apical left upper lobe indistinct 0.4 cm pulmonary nodule (series 7/image 18), below PET resolution, unchanged. Chronic bandlike consolidation in the anterior left lower  lobe with associated mild volume loss and distortion, unchanged and compatible with postradiation change. No new significant pulmonary nodules. Incidental CT findings: Coronary atherosclerosis. Atherosclerotic nonaneurysmal thoracic aorta. Status post right upper lobectomy. Moderate centrilobular emphysema. ABDOMEN/PELVIS: No abnormal hypermetabolic activity within the liver, pancreas, adrenal glands, or spleen. No hypermetabolic lymph nodes in the abdomen or pelvis. Incidental CT findings: Large hiatal hernia. Stomach is nondistended. Cholelithiasis. Chronic dilated common bile duct with 9 mm diameter, unchanged. Posterior upper left renal simple 1.8 cm cyst, for which no follow-up imaging is recommended. Atherosclerotic nonaneurysmal abdominal aorta. Moderate colonic stool. SKELETON: No new focal skeletal hypermetabolic activity. Low level FDG uptake at the inferior right scapula at the site of healing sclerotic pathologic fracture with max SUV 2.2, previous max SUV 3.9, decreased. Incidental CT findings: Bilateral posterior spinal fusion hardware in the lumbosacral spine. IMPRESSION: 1. No evidence of new or progressive hypermetabolic metastatic disease. 2. Low level FDG uptake at the site of the healing sclerotic pathologic fracture in the  inferior right scapula, decreased, compatible with post treatment change. 3. Posterior left upper lobe 0.8 cm nodular focus of consolidation adjacent to fiducial marker without associated FDG uptake, slightly decreased from 1.0 cm on 10/23/2022 PET-CT, compatible with treated tumor. 4. Chronic postradiation change in the anterior left lower lobe. 5. Chronic findings include: Large hiatal hernia. Cholelithiasis. Coronary atherosclerosis. Moderate colonic stool, suggesting constipation. Aortic Atherosclerosis (ICD10-I70.0) and Emphysema (ICD10-J43.9). Electronically Signed   By: Delbert Phenix M.D.   On: 01/19/2023 13:03

## 2023-01-21 ENCOUNTER — Inpatient Hospital Stay: Payer: Medicare Other | Attending: Physician Assistant

## 2023-01-21 ENCOUNTER — Inpatient Hospital Stay: Payer: Medicare Other

## 2023-01-21 ENCOUNTER — Inpatient Hospital Stay (HOSPITAL_BASED_OUTPATIENT_CLINIC_OR_DEPARTMENT_OTHER): Payer: Medicare Other | Admitting: Hematology

## 2023-01-21 VITALS — BP 109/58 | HR 62 | Temp 97.6°F | Resp 20

## 2023-01-21 DIAGNOSIS — C3412 Malignant neoplasm of upper lobe, left bronchus or lung: Secondary | ICD-10-CM | POA: Insufficient documentation

## 2023-01-21 DIAGNOSIS — Z79620 Long term (current) use of immunosuppressive biologic: Secondary | ICD-10-CM | POA: Diagnosis not present

## 2023-01-21 DIAGNOSIS — Z5112 Encounter for antineoplastic immunotherapy: Secondary | ICD-10-CM | POA: Insufficient documentation

## 2023-01-21 DIAGNOSIS — Z95828 Presence of other vascular implants and grafts: Secondary | ICD-10-CM

## 2023-01-21 DIAGNOSIS — Z720 Tobacco use: Secondary | ICD-10-CM | POA: Diagnosis not present

## 2023-01-21 LAB — COMPREHENSIVE METABOLIC PANEL
ALT: 25 U/L (ref 0–44)
AST: 19 U/L (ref 15–41)
Albumin: 3.5 g/dL (ref 3.5–5.0)
Alkaline Phosphatase: 83 U/L (ref 38–126)
Anion gap: 8 (ref 5–15)
BUN: 19 mg/dL (ref 8–23)
CO2: 25 mmol/L (ref 22–32)
Calcium: 8.7 mg/dL — ABNORMAL LOW (ref 8.9–10.3)
Chloride: 102 mmol/L (ref 98–111)
Creatinine, Ser: 0.79 mg/dL (ref 0.61–1.24)
GFR, Estimated: >60 mL/min (ref >60)
Glucose, Bld: 126 mg/dL — ABNORMAL HIGH (ref 70–99)
Potassium: 3.6 mmol/L (ref 3.5–5.1)
Sodium: 135 mmol/L (ref 135–145)
Total Bilirubin: 0.7 mg/dL (ref 0.3–1.2)
Total Protein: 6.5 g/dL (ref 6.5–8.1)

## 2023-01-21 LAB — CBC WITH DIFFERENTIAL/PLATELET
Abs Immature Granulocytes: 0.07 10*3/uL (ref 0.00–0.07)
Basophils Absolute: 0.1 10*3/uL (ref 0.0–0.1)
Basophils Relative: 0 %
Eosinophils Absolute: 0.4 10*3/uL (ref 0.0–0.5)
Eosinophils Relative: 3 %
HCT: 39.3 % (ref 39.0–52.0)
Hemoglobin: 12.9 g/dL — ABNORMAL LOW (ref 13.0–17.0)
Immature Granulocytes: 1 %
Lymphocytes Relative: 6 %
Lymphs Abs: 0.8 10*3/uL (ref 0.7–4.0)
MCH: 32.6 pg (ref 26.0–34.0)
MCHC: 32.8 g/dL (ref 30.0–36.0)
MCV: 99.2 fL (ref 80.0–100.0)
Monocytes Absolute: 0.7 10*3/uL (ref 0.1–1.0)
Monocytes Relative: 6 %
Neutro Abs: 10.4 10*3/uL — ABNORMAL HIGH (ref 1.7–7.7)
Neutrophils Relative %: 84 %
Platelets: 197 10*3/uL (ref 150–400)
RBC: 3.96 MIL/uL — ABNORMAL LOW (ref 4.22–5.81)
RDW: 13.1 % (ref 11.5–15.5)
WBC: 12.3 10*3/uL — ABNORMAL HIGH (ref 4.0–10.5)
nRBC: 0 % (ref 0.0–0.2)

## 2023-01-21 LAB — MAGNESIUM: Magnesium: 1.7 mg/dL (ref 1.7–2.4)

## 2023-01-21 LAB — TSH: TSH: 1.283 u[IU]/mL (ref 0.350–4.500)

## 2023-01-21 MED ORDER — SODIUM CHLORIDE 0.9 % IV SOLN
200.0000 mg | Freq: Once | INTRAVENOUS | Status: AC
  Administered 2023-01-21: 200 mg via INTRAVENOUS
  Filled 2023-01-21: qty 8

## 2023-01-21 MED ORDER — SODIUM CHLORIDE 0.9% FLUSH
10.0000 mL | Freq: Once | INTRAVENOUS | Status: AC
  Administered 2023-01-21: 10 mL via INTRAVENOUS

## 2023-01-21 MED ORDER — HEPARIN SOD (PORK) LOCK FLUSH 100 UNIT/ML IV SOLN
500.0000 [IU] | Freq: Once | INTRAVENOUS | Status: AC | PRN
  Administered 2023-01-21: 500 [IU]

## 2023-01-21 MED ORDER — SODIUM CHLORIDE 0.9% FLUSH
10.0000 mL | INTRAVENOUS | Status: DC | PRN
  Administered 2023-01-21: 10 mL

## 2023-01-21 MED ORDER — SODIUM CHLORIDE 0.9 % IV SOLN: Freq: Once | INTRAVENOUS | Status: AC

## 2023-01-21 NOTE — Progress Notes (Signed)
Patient has been examined by Dr. Katragadda. Vital signs and labs have been reviewed by MD - ANC, Creatinine, LFTs, hemoglobin, and platelets are within treatment parameters per M.D. - pt may proceed with treatment.  Primary RN and pharmacy notified.  

## 2023-01-21 NOTE — Patient Instructions (Addendum)
[facility] at [facility] Discharge Instructions   You were seen and examined today by Dr. Ellin Saba.  He reviewed the results of your lab work which are normal/stable.   He reviewed the results of your PET scan which was stable. 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   We will proceed with your treatment today.   Call Dr. Scharlene Gloss office to set up an appointment to get your rash evaluated.   Return as scheduled.    Thank you for choosing [facility] at [facility] to provide your oncology and hematology care.  To afford each patient quality time with our provider, please arrive at least 15 minutes before your scheduled appointment time.   If you have a lab appointment with the Cancer Center please come in thru the Main Entrance and check in at the main information desk.  You need to re-schedule your appointment should you arrive 10 or more minutes late.  We strive to give you quality time with our providers, and arriving late affects you and other patients whose appointments are after yours.  Also, if you no show three or more times for appointments you may be dismissed from the clinic at the providers discretion.     Again, thank you for choosing [facility].  Our hope is that these requests will decrease the amount of time that you wait before being seen by our physicians.       _____________________________________________________________  Should you have questions after your visit to [facility], please contact our office at [phone] and follow the prompts.  Our office hours are 8:00 a.m. and 4:30 p.m. Monday - Friday.  Please note that voicemails left after 4:00 p.m. may not be returned until the following business day.  We are closed weekends and major holidays.  You do have access to a nurse 24-7, just call the main number to the clinic [phone] and do not press any options, hold on the  line and a nurse will answer the phone.    For prescription refill requests, have your pharmacy contact our office and allow 72 hours.    Due to Covid, you will need to wear a mask upon entering the hospital. If you do not have a mask, a mask will be given to you at the Main Entrance upon arrival. For doctor visits, patients may have 1 support person age 49 or older with them. For treatment visits, patients can not have anyone with them due to social distancing guidelines and our immunocompromised population.

## 2023-01-21 NOTE — Progress Notes (Signed)
Patient presents today for Keytruda infusion. Patient is in satisfactory condition with no new complaints voiced.  Vital signs are stable.  Labs reviewed by Dr. Katragadda during the office visit and all labs are within treatment parameters.  We will proceed with treatment per MD orders.   Keytruda given today per MD orders. Tolerated infusion without adverse affects. Vital signs stable. No complaints at this time. Discharged from clinic ambulatory in stable condition. Alert and oriented x 3. F/U with [facility] as scheduled.   

## 2023-01-21 NOTE — Patient Instructions (Signed)
[facility]  Discharge Instructions: Thank you for choosing [facility] to provide your oncology and hematology care.  If you have a lab appointment with the Cancer Center - please note that after April 8th, 2024, all labs will be drawn in the cancer center.  You do not have to check in or register with the main entrance as you have in the past but will complete your check-in in the cancer center.  Wear comfortable clothing and clothing appropriate for easy access to any Portacath or PICC line.   We strive to give you quality time with your provider. You may need to reschedule your appointment if you arrive late (15 or more minutes).  Arriving late affects you and other patients whose appointments are after yours.  Also, if you miss three or more appointments without notifying the office, you may be dismissed from the clinic at the provider's discretion.      For prescription refill requests, have your pharmacy contact our office and allow 72 hours for refills to be completed.    Today you received the following chemotherapy and/or immunotherapy agents Keytruda   To help prevent nausea and vomiting after your treatment, we encourage you to take your nausea medication as directed.  Pembrolizumab Injection What is this medication? PEMBROLIZUMAB (PEM broe LIZ ue mab) treats some types of cancer. It works by helping your immune system slow or stop the spread of cancer cells. It is a monoclonal antibody. This medicine may be used for other purposes; ask your health care provider or pharmacist if you have questions. COMMON BRAND NAME(S): Keytruda What should I tell my care team before I take this medication? They need to know if you have any of these conditions: Allogeneic stem cell transplant (uses someone else's stem cells) Autoimmune diseases, such as Crohn disease, ulcerative colitis, lupus History of chest radiation Nervous system problems, such as  Guillain-Barre syndrome, myasthenia gravis Organ transplant An unusual or allergic reaction to pembrolizumab, other medications, foods, dyes, or preservatives Pregnant or trying to get pregnant Breast-feeding How should I use this medication? This medication is injected into a vein. It is given by your care team in a hospital or clinic setting. A special MedGuide will be given to you before each treatment. Be sure to read this information carefully each time. Talk to your care team about the use of this medication in children. While it may be prescribed for children as young as 6 months for selected conditions, precautions do apply. Overdosage: If you think you have taken too much of this medicine contact a poison control center or emergency room at once. NOTE: This medicine is only for you. Do not share this medicine with others. What if I miss a dose? Keep appointments for follow-up doses. It is important not to miss your dose. Call your care team if you are unable to keep an appointment. What may interact with this medication? Interactions have not been studied. This list may not describe all possible interactions. Give your health care provider a list of all the medicines, herbs, non-prescription drugs, or dietary supplements you use. Also tell them if you smoke, drink alcohol, or use illegal drugs. Some items may interact with your medicine. What should I watch for while using this medication? Your condition will be monitored carefully while you are receiving this medication. You may need blood work while taking this medication. This medication may cause serious skin reactions. They can happen weeks to months   after starting the medication. Contact your care team right away if you notice fevers or flu-like symptoms with a rash. The rash may be red or purple and then turn into blisters or peeling of the skin. You may also notice a red rash with swelling of the face, lips, or lymph nodes in your  neck or under your arms. Tell your care team right away if you have any change in your eyesight. Talk to your care team if you may be pregnant. Serious birth defects can occur if you take this medication during pregnancy and for 4 months after the last dose. You will need a negative pregnancy test before starting this medication. Contraception is recommended while taking this medication and for 4 months after the last dose. Your care team can help you find the option that works for you. Do not breastfeed while taking this medication and for 4 months after the last dose. What side effects may I notice from receiving this medication? Side effects that you should report to your care team as soon as possible: Allergic reactions--skin rash, itching, hives, swelling of the face, lips, tongue, or throat Dry cough, shortness of breath or trouble breathing Eye pain, redness, irritation, or discharge with blurry or decreased vision Heart muscle inflammation--unusual weakness or fatigue, shortness of breath, chest pain, fast or irregular heartbeat, dizziness, swelling of the ankles, feet, or hands Hormone gland problems--headache, sensitivity to light, unusual weakness or fatigue, dizziness, fast or irregular heartbeat, increased sensitivity to cold or heat, excessive sweating, constipation, hair loss, increased thirst or amount of urine, tremors or shaking, irritability Infusion reactions--chest pain, shortness of breath or trouble breathing, feeling faint or lightheaded Kidney injury (glomerulonephritis)--decrease in the amount of urine, red or dark brown urine, foamy or bubbly urine, swelling of the ankles, hands, or feet Liver injury--right upper belly pain, loss of appetite, nausea, light-colored stool, dark yellow or brown urine, yellowing skin or eyes, unusual weakness or fatigue Pain, tingling, or numbness in the hands or feet, muscle weakness, change in vision, confusion or trouble speaking, loss of  balance or coordination, trouble walking, seizures Rash, fever, and swollen lymph nodes Redness, blistering, peeling, or loosening of the skin, including inside the mouth Sudden or severe stomach pain, bloody diarrhea, fever, nausea, vomiting Side effects that usually do not require medical attention (report to your care team if they continue or are bothersome): Bone, joint, or muscle pain Diarrhea Fatigue Loss of appetite Nausea Skin rash This list may not describe all possible side effects. Call your doctor for medical advice about side effects. You may report side effects to FDA at 1-800-FDA-1088. Where should I keep my medication? This medication is given in a hospital or clinic. It will not be stored at home. NOTE: This sheet is a summary. It may not cover all possible information. If you have questions about this medicine, talk to your doctor, pharmacist, or health care provider.  2023 Elsevier/Gold Standard (2022-01-07 00:00:00)   BELOW ARE SYMPTOMS THAT SHOULD BE REPORTED IMMEDIATELY: *FEVER GREATER THAN 100.4 F (38 C) OR HIGHER *CHILLS OR SWEATING *NAUSEA AND VOMITING THAT IS NOT CONTROLLED WITH YOUR NAUSEA MEDICATION *UNUSUAL SHORTNESS OF BREATH *UNUSUAL BRUISING OR BLEEDING *URINARY PROBLEMS (pain or burning when urinating, or frequent urination) *BOWEL PROBLEMS (unusual diarrhea, constipation, pain near the anus) TENDERNESS IN MOUTH AND THROAT WITH OR WITHOUT PRESENCE OF ULCERS (sore throat, sores in mouth, or a toothache) UNUSUAL RASH, SWELLING OR PAIN  UNUSUAL VAGINAL DISCHARGE OR ITCHING   Items   with * indicate a potential emergency and should be followed up as soon as possible or go to the Emergency Department if any problems should occur.  Please show the CHEMOTHERAPY ALERT CARD or IMMUNOTHERAPY ALERT CARD at check-in to the Emergency Department and triage nurse.  Should you have questions after your visit or need to cancel or reschedule your appointment, please  contact [facility] [phone]  and follow the prompts.  Office hours are 8:00 a.m. to 4:30 p.m. Monday - Friday. Please note that voicemails left after 4:00 p.m. may not be returned until the following business day.  We are closed weekends and major holidays. You have access to a nurse at all times for urgent questions. Please call the main number to the clinic [phone] and follow the prompts.  For any non-urgent questions, you may also contact your provider using MyChart. We now offer e-Visits for anyone 18 and older to request care online for non-urgent symptoms. For details visit [email or web address].   Also download the MyChart app! Go to the app store, search "MyChart", open the app, select , and log in with your MyChart username and password.  

## 2023-01-27 ENCOUNTER — Other Ambulatory Visit: Payer: Self-pay

## 2023-02-06 ENCOUNTER — Other Ambulatory Visit: Payer: Self-pay

## 2023-02-07 ENCOUNTER — Other Ambulatory Visit: Payer: Self-pay | Admitting: Hematology

## 2023-02-09 DIAGNOSIS — C3432 Malignant neoplasm of lower lobe, left bronchus or lung: Secondary | ICD-10-CM | POA: Diagnosis not present

## 2023-02-09 DIAGNOSIS — G8929 Other chronic pain: Secondary | ICD-10-CM | POA: Diagnosis not present

## 2023-02-09 DIAGNOSIS — Z1331 Encounter for screening for depression: Secondary | ICD-10-CM | POA: Diagnosis not present

## 2023-02-09 DIAGNOSIS — M1991 Primary osteoarthritis, unspecified site: Secondary | ICD-10-CM | POA: Diagnosis not present

## 2023-02-09 DIAGNOSIS — Z6822 Body mass index (BMI) 22.0-22.9, adult: Secondary | ICD-10-CM | POA: Diagnosis not present

## 2023-02-09 DIAGNOSIS — J449 Chronic obstructive pulmonary disease, unspecified: Secondary | ICD-10-CM | POA: Diagnosis not present

## 2023-02-09 DIAGNOSIS — I10 Essential (primary) hypertension: Secondary | ICD-10-CM | POA: Diagnosis not present

## 2023-02-09 DIAGNOSIS — C3490 Malignant neoplasm of unspecified part of unspecified bronchus or lung: Secondary | ICD-10-CM | POA: Diagnosis not present

## 2023-02-09 DIAGNOSIS — M5136 Other intervertebral disc degeneration, lumbar region: Secondary | ICD-10-CM | POA: Diagnosis not present

## 2023-02-09 DIAGNOSIS — Z0001 Encounter for general adult medical examination with abnormal findings: Secondary | ICD-10-CM | POA: Diagnosis not present

## 2023-02-10 ENCOUNTER — Other Ambulatory Visit: Payer: Self-pay

## 2023-02-10 DIAGNOSIS — C3412 Malignant neoplasm of upper lobe, left bronchus or lung: Secondary | ICD-10-CM

## 2023-02-10 NOTE — Progress Notes (Signed)
[facility] [address], [place] [address]    Clinic Day:  02/11/2023  Referring physician: Elfredia Nevins, MD  Patient Care Team: Elfredia Nevins, MD as PCP - General (Internal Medicine) Marykay Lex, MD as PCP - Cardiology (Cardiology) Malissa Hippo, MD (Inactive) as Consulting Physician (Gastroenterology) Lonie Peak, MD as Attending Physician (Radiation Oncology)   ASSESSMENT & PLAN:   Assessment: 1.  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 - Status post right upper lobectomy in February 2012 for right lung cancer. - LDCT chest (04/14/2022): Enlarging spiculated nodular soft tissue thickening in the posterior left upper lobe measuring 11.4 mm. - PET scan (04/24/2022): Left upper lobe lung nodule 14 mm with SUV 3.62.  Hypermetabolic focus in the inferior aspect of the right scapula without discrete CT correlate with SUV 5.71.  No hypermetabolic adenopathy. - LUL lesion FNA (06/03/2022): Adenocarcinoma - Right scapula biopsy (07/03/2022) adenocarcinoma consistent with lung primary - NGS: PD-L1 22 C3 TPS 100%, T p53 pathogenic variant, MSI-stable, TMB-low, no other targetable mutations - Cycle 1 of pembrolizumab started on 07/21/2022 - XRT to the right scapular lesion and left upper lobe lung lesion 5 fractions completed on 08/18/2022.   2.  Cystic lesion of left kidney - CTA PE on 04/14/2022 showed complex cystic lesion of the left kidney.  PET scan also showed exophytic 18 mm left renal lesion suspicious for malignancy. - MRI abdomen on 05/16/2022 showed benign appearing Bosniak category 1 and 2 left renal cyst with no evidence of neoplasm.  No further work-up needed.    Plan: 1.  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  Low-level FDG uptake at the site of  healing sclerotic pathological fracture in the inferior right scapula decreased. - He reports that his rash has cleared up from last treatment.  However he is having watery diarrhea, 2-3 times per day intermittently for 8 days.  He is taking Imodium 2 tablets each time and did not have to take any more rest of the day. - He also reported difficulty urination.  I think this is most likely from him taking Imodium.  He is already on Flomax 0.4 mg which she will continue. - Reviewed labs today: Normal LFTs and creatinine.  CBC grossly normal with mildly elevated white count from prednisone. - Because of diarrhea, I would hold his treatment today.  I will reevaluate him in 3 weeks.  He was told to call us if he had any watery diarrhea more than 5 times per day.  At that point we will start him on prednisone 1 mg/kg dose.   2.  Left posterior chest wall rash: - This has improved since last visit.  Continue prednisone 5 mg daily.  Any worsening, will follow-up with dermatology.   3.  B12 deficiency - Continue B12 every other day and iron tablet daily.   4.  Hypokalemia: - Continue potassium 20 mEq daily.  Potassium today is low at 3.2 as he is having diarrhea.  He will take an extra dose today.    Orders Placed This Encounter  Procedures   Magnesium    Standing Status:   Future    Standing Expiration Date:   07/28/2024   CBC with Differential    Standing Status:   Future    Standing  Expiration Date:   07/28/2024   Comprehensive metabolic panel    Standing Status:   Future    Standing Expiration Date:   07/28/2024      I,Katie Daubenspeck,acting as a scribe for Doreatha Massed, MD.,have documented all relevant documentation on the behalf of Doreatha Massed, MD,as directed by  Doreatha Massed, MD while in the presence of Doreatha Massed, MD.   I, Doreatha Massed MD, have reviewed the above documentation for accuracy and completeness, and I agree with the  above.   Doreatha Massed, MD   6/5/20245:04 PM  CHIEF COMPLAINT:   Diagnosis: bilateral lung cancers, normocytic anemia, and B12 deficiency    Cancer Staging  Adenocarcinoma of lung (HCC) Staging form: Lung, AJCC 7th Edition - Clinical: Stage IA (T1a, N0, M0) - Signed by [name], PA on 05/06/2011  Adenocarcinoma of upper lobe of left lung [facility]) Staging form: Lung, AJCC 8th Edition - Clinical stage from 07/16/2022: Stage IVA (cT1b, cN0, pM1b) - Unsigned    Prior Therapy: 1. Right upper lobectomy (February 2012) for right lung cancer 2. SBRT (November 2015) for stage I left adenocarcinoma  Current Therapy:  Pembrolizumab    HISTORY OF PRESENT ILLNESS:   Oncology History  Adenocarcinoma of lung (HCC)  09/30/2010 Cancer Staging   CT of chest showed spiculated lesion   10/15/2010 Surgery   Right upper lobectomy by Dr. Edwyna Shell   10/16/2010 Remission     10/24/2013 Imaging   CT Chest-  Enlarging subpleural nodule in the left lower lobe, now 9 mm, with distortion of the overlying pleura. Finding is concerning for a small bronchogenic carcinoma   01/23/2014 Imaging   CT Chest- Left lower lobe nodule has enlarged from 11/01/2012 and is worrisome for primary bronchogenic carcinoma.    07/04/2014 - 07/10/2014 Radiation Therapy   SBRT by Dr. Basilio Cairo in 3 fractions.   07/05/2015 Imaging   perifissural nodule in LLL appears slightly smaller with parenchymal opacity attrib to XRT, likely inflamm pathcy airspace dz more inferiorly in LLL, airspace dz in RLL resolved. no metastatic disease   Primary cancer of left lower lobe of lung (HCC)  06/18/2014 Initial Diagnosis   Left lower lobe nodule suspicious for primary lung carcinoma   07/04/2014 - 07/10/2014 Radiation Therapy   SBRT   10/12/2014 Imaging   No acute findings within the chest. Decrease in size of left lower lobe perifissural nodule compatible with response to therapy. No new or progressive disease identified within  the chest.   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)  07/16/2022 Initial Diagnosis   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)   07/21/2022 -  Chemotherapy   Patient is on Treatment Plan : LUNG NSCLC Pembrolizumab (200) q21d        INTERVAL HISTORY:   Maurice Orozco is a 77 y.o. male presenting to clinic today for follow up of bilateral lung cancers, normocytic anemia, and B12 deficiency. He was last seen by me on 01/21/23.  Today, he states that he is doing well overall. His appetite level is at 100%. His energy level is at 75%.  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 dense RCA calcification --Non-ischemic Myoview   History of kidney stones    in the 70's   History of radiation therapy 07/04/14, 07/06/14, 07/10/14   LLL lung nodule/54 Gy/3 fx- SBRT   Hypercholesterolemia 05/06/2011   Pneumonia due to Streptococcus    HX. POST OPERATIVELY   Port-A-Cath in place 07/21/2022   Pre-diabetes    Stress fx pelvis    DUE TO AA IN 1970    Surgical History: Past Surgical History:  Procedure Laterality Date   BACK SURGERY  07/04/2017   fusion above previous surgery   BACK SURGERY  2011   metal plate Z-6,X0   BRONCHIAL BIOPSY  06/03/2022   Procedure: BRONCHIAL BIOPSIES;  Surgeon: Josephine Igo, DO;  Location: [facility];  Service: Pulmonary;;   BRONCHIAL BRUSHINGS  06/03/2022   Procedure: BRONCHIAL BRUSHINGS;  Surgeon: Josephine Igo, DO;  Location: [facility];  Service: Pulmonary;;   BRONCHIAL NEEDLE ASPIRATION BIOPSY  06/03/2022   Procedure: BRONCHIAL NEEDLE ASPIRATION BIOPSIES;  Surgeon: Josephine Igo, DO;  Location: [facility];  Service: Pulmonary;;   Coroanry Calcium Score  03/2017   1043. Noted especially in RCA. Dense calcification, recommend Myoview over Cor CTA.   FIDUCIAL MARKER PLACEMENT  06/03/2022   Procedure: FIDUCIAL MARKER PLACEMENT;  Surgeon: Josephine Igo, DO;  Location: [facility];  Service: Pulmonary;;   IR IMAGING GUIDED PORT INSERTION  08/04/2022   LIPOMA EXCISION N/A 10/14/2017   Procedure: EXCISION LIPOMA ON BACK;  Surgeon: Lucretia Roers, MD;  Location: [facility];  Service: General;  Laterality: N/A;   LIPOMA EXCISION Left 11/19/2018   Procedure: MINOR EXCISION OF SEBACEOUS CYST NECK;  Surgeon: Lucretia Roers, MD;  Location: [facility];  Service: General;  Laterality: Left;  pt knows to arrive at 7:00   LUNG LOBECTOMY  2012   RT UPPER LOBE   NM MYOVIEW LTD  04/2017   EF 60%. Hypertensive response to exercise. (6:21 min, 7.7 METs) No EKG changes. LOW RISK    Social History: Social History   Socioeconomic History   Marital status: Married    Spouse name: Not on file   Number of children: 3   Years of education: Not on file   Highest education level: Not on file  Occupational History   Not on file  Tobacco Use   Smoking status: Former    Packs/day: 1.50    Years: 59.00    Additional pack years: 0.00    Total pack years: 88.50    Types: Cigarettes    Quit date: 09/08/2010    Years since quitting: 12.4   Smokeless tobacco: Current    Types: Chew   Tobacco comments:    still chews tobacco, he tells me he hasn't in the past 2 days.   Vaping Use   Vaping Use: Never used  Substance and Sexual Activity   Alcohol use: No   Drug use: No   Sexual activity: Not Currently  Other Topics Concern   Not on file  Social History Narrative   Not on file   Social Determinants of Health   Financial  Resource Strain: Not on file  Food Insecurity: Not on file  Transportation Needs: No  Transportation Needs (07/13/2018)   PRAPARE - Administrator, Civil Service (Medical): No    Lack of Transportation (Non-Medical): No  Physical Activity: Not on file  Stress: Not on file  Social Connections: Not on file  Intimate Partner Violence: Not At Risk (07/13/2018)   Humiliation, Afraid, Rape, and Kick questionnaire    Fear of Current or Ex-Partner: No    Emotionally Abused: No    Physically Abused: No    Sexually Abused: No    Family History: Family History  Problem Relation Age of Onset   Hypertension Mother    Kidney disease Father    Heart attack Brother    Cancer Maternal Uncle        prostate cancer   Cancer Maternal Uncle        prostate cancer   Cancer Maternal Uncle        prostate cancer   Cancer Maternal Uncle        prostate cancer    Current Medications:  Current Outpatient Medications:    albuterol (PROVENTIL HFA;VENTOLIN HFA) 108 (90 Base) MCG/ACT inhaler, Inhale 2 puffs into the lungs every 4 (four) hours as needed for wheezing or shortness of breath., Disp: 2 Inhaler, Rfl: 3   albuterol (PROVENTIL) (2.5 MG/3ML) 0.083% nebulizer solution, Take 2.5 mg by nebulization every 6 (six) hours as needed for wheezing or shortness of breath., Disp: , Rfl:    betamethasone dipropionate 0.05 % cream, Apply topically 2 (two) times daily., Disp: 30 g, Rfl: 1   cholecalciferol (VITAMIN D3) 25 MCG (1000 UNIT) tablet, Take 1,000 Units by mouth daily., Disp: , Rfl:    cimetidine (TAGAMET) 200 MG tablet, Take 400-600 mg by mouth daily as needed (for heartburn)., Disp: , Rfl:    clobetasol ointment (TEMOVATE) 0.05 %, Apply topically 2 (two) times daily as needed., Disp: , Rfl:    ferrous sulfate 325 (65 FE) MG tablet, Take 325 mg by mouth daily with breakfast., Disp: , Rfl:    fluticasone (FLONASE) 50 MCG/ACT nasal spray, Place 2 sprays into both nostrils daily as needed for  rhinitis., Disp: , Rfl:    guaiFENesin (MUCINEX) 600 MG 12 hr tablet, Take 600 mg by mouth daily., Disp: , Rfl:    hydrochlorothiazide (HYDRODIURIL) 25 MG tablet, Take 12.5 mg by mouth daily., Disp: , Rfl:    HYDROcodone-acetaminophen (NORCO) 10-325 MG tablet, Take 1-2 tablets by mouth every 4 (four) hours as needed for moderate pain., Disp: 240 tablet, Rfl: 0   hydrOXYzine (ATARAX) 10 MG tablet, TAKE 1 TABLET BY MOUTH THREE TIMES A DAY AS NEEDED, Disp: 30 tablet, Rfl: 0   hydrOXYzine (VISTARIL) 25 MG capsule, Take 25-50 mg by mouth at bedtime as needed., Disp: , Rfl:    KLOR-CON M20 20 MEQ tablet, TAKE 1 TABLET BY MOUTH EVERY DAY, Disp: 90 tablet, Rfl: 1   lidocaine-prilocaine (EMLA) cream, APPLY A SMALL AMOUNT TO PORT A CATH SITE AND COVER WITH PLASTIC WRAP 1 HOUR PRIOR TO INFUSION APPOINTMENTS, Disp: 30 g, Rfl: 3   Menthol, Topical Analgesic, (ICY HOT EX), Apply 1 Application topically daily as needed (pain)., Disp: , Rfl:    naproxen sodium (ALEVE) 220 MG tablet, Take 220 mg by mouth daily as needed (pain)., Disp: , Rfl:    Omega-3 Fatty Acids (FISH OIL) 1000 MG CAPS, Take 1,000 mg by mouth daily., Disp: , Rfl:    Oxycodone HCl 10 MG TABS, Take 10 mg by mouth 2 (two) times daily as needed (  pain)., Disp: , Rfl:    pantoprazole (PROTONIX) 40 MG tablet, Take 40 mg by mouth See admin instructions. Take 40 mg daily, may take a second 40 mg dose as needed for heartburn, Disp: , Rfl:    PEMBROLIZUMAB IV, Inject into the vein every 21 ( twenty-one) days., Disp: , Rfl:    polyethylene glycol powder (GLYCOLAX/MIRALAX) 17 GM/SCOOP powder, Take 17 g by mouth daily as needed for mild constipation or moderate constipation., Disp: , Rfl:    predniSONE (DELTASONE) 5 MG tablet, Take 1 tablet (5 mg total) by mouth daily with breakfast., Disp: 30 tablet, Rfl: 4   prochlorperazine (COMPAZINE) 10 MG tablet, Take 1 tablet (10 mg total) by mouth every 6 (six) hours as needed for nausea or vomiting., Disp: 30 tablet,  Rfl: 1   rosuvastatin (CRESTOR) 40 MG tablet, Take 40 mg by mouth at bedtime., Disp: , Rfl:    tamsulosin (FLOMAX) 0.4 MG CAPS capsule, Take 1 capsule (0.4 mg total) by mouth daily., Disp: 30 capsule, Rfl: 3   triamcinolone cream (KENALOG) 0.1 %, Apply 1 Application topically daily as needed (tick bite/itching)., Disp: , Rfl:    triazolam (HALCION) 0.25 MG tablet, Take 0.25 mg by mouth at bedtime., Disp: , Rfl:    vitamin B-12 (CYANOCOBALAMIN) 1000 MCG tablet, Take 1,000 mcg by mouth every other day., Disp: , Rfl:  No current facility-administered medications for this visit.  Facility-Administered Medications Ordered in Other Visits:    sodium chloride flush (NS) 0.9 % injection 10 mL, 10 mL, Intracatheter, PRN, Doreatha Massed, MD, 10 mL at 10/07/22 1523   sodium chloride flush (NS) 0.9 % injection 10 mL, 10 mL, Intracatheter, PRN, Doreatha Massed, MD, 10 mL at 10/28/22 1446   sodium chloride flush (NS) 0.9 % injection 10 mL, 10 mL, Intracatheter, PRN, Doreatha Massed, MD, 10 mL at 11/19/22 1547   Allergies: Allergies  Allergen Reactions   Fentanyl Other (See Comments)    CAUSED HALLUCINATIONS/05-03-13 pt reports it was oral fentanyl that caused hallucinations    Gabapentin Other (See Comments)    Bones throbbed, headaches, weakness   Nabumetone     Unknown reaction    Aspirin Nausea Only    REVIEW OF SYSTEMS:   Review of Systems  Constitutional:  Negative for chills, fatigue and fever.  HENT:   Negative for lump/mass, mouth sores, nosebleeds, sore throat and trouble swallowing.   Eyes:  Negative for eye problems.  Respiratory:  Positive for cough and shortness of breath.   Cardiovascular:  Negative for chest pain, leg swelling and palpitations.  Gastrointestinal:  Positive for diarrhea. Negative for abdominal pain, constipation, nausea and vomiting.  Genitourinary:  Positive for difficulty urinating. Negative for bladder incontinence, dysuria, frequency, hematuria  and nocturia.   Musculoskeletal:  Negative for arthralgias, back pain, flank pain, myalgias and neck pain.  Skin:  Negative for itching and rash.  Neurological:  Negative for dizziness, headaches and numbness.  Hematological:  Does not bruise/bleed easily.  Psychiatric/Behavioral:  Positive for sleep disturbance. Negative for depression and suicidal ideas. The patient is not nervous/anxious.   All other systems reviewed and are negative.    VITALS:   There were no vitals taken for this visit.  Wt Readings from Last 3 Encounters:  02/11/23 137 lb 9.6 oz (62.4 kg)  01/21/23 142 lb 9.6 oz (64.7 kg)  12/31/22 133 lb 14.4 oz (60.7 kg)    There is no height or weight on file to calculate BMI.  Performance status (ECOG):  1 - Symptomatic but completely ambulatory  PHYSICAL EXAM:   Physical Exam Vitals and nursing note reviewed. Exam conducted with a chaperone present.  Constitutional:      Appearance: Normal appearance.  Cardiovascular:     Rate and Rhythm: Normal rate and regular rhythm.     Pulses: Normal pulses.     Heart sounds: Normal heart sounds.  Pulmonary:     Effort: Pulmonary effort is normal.     Breath sounds: Normal breath sounds.  Abdominal:     Palpations: Abdomen is soft. There is no hepatomegaly, splenomegaly or mass.     Tenderness: There is no abdominal tenderness.  Musculoskeletal:     Right lower leg: No edema.     Left lower leg: No edema.  Lymphadenopathy:     Cervical: No cervical adenopathy.     Right cervical: No superficial, deep or posterior cervical adenopathy.    Left cervical: No superficial, deep or posterior cervical adenopathy.     Upper Body:     Right upper body: No supraclavicular or axillary adenopathy.     Left upper body: No supraclavicular or axillary adenopathy.  Neurological:     General: No focal deficit present.     Mental Status: He is alert and oriented to person, place, and time.  Psychiatric:        Mood and Affect: Mood  normal.        Behavior: Behavior normal.     LABS:      Latest Ref Rng & Units 02/11/2023   11:57 AM 01/21/2023   11:45 AM 12/31/2022    9:39 AM  CBC  WBC 4.0 - 10.5 K/uL 12.3  12.3  7.6   Hemoglobin 13.0 - 17.0 g/dL 16.1  09.6  04.5   Hematocrit 39.0 - 52.0 % 42.8  39.3  40.1   Platelets 150 - 400 K/uL 242  197  182       Latest Ref Rng & Units 02/11/2023   11:57 AM 01/21/2023   11:45 AM 12/31/2022    9:39 AM  CMP  Glucose 70 - 99 mg/dL 409  811  914   BUN 8 - 23 mg/dL 30  19  16    Creatinine 0.61 - 1.24 mg/dL 7.82  9.56  2.13   Sodium 135 - 145 mmol/L 134  135  135   Potassium 3.5 - 5.1 mmol/L 3.2  3.6  3.3   Chloride 98 - 111 mmol/L 101  102  101   CO2 22 - 32 mmol/L 21  25  24    Calcium 8.9 - 10.3 mg/dL 9.0  8.7  9.0   Total Protein 6.5 - 8.1 g/dL 7.4  6.5  6.9   Total Bilirubin 0.3 - 1.2 mg/dL 1.0  0.7  0.5   Alkaline Phos 38 - 126 U/L 79  83  80   AST 15 - 41 U/L 22  19  15    ALT 0 - 44 U/L 29  25  19       No results found for: "CEA1", "CEA" / No results found for: "CEA1", "CEA" No results found for: "PSA1" No results found for: "YQM578" No results found for: "CAN125"  No results found for: "TOTALPROTELP", "ALBUMINELP", "A1GS", "A2GS", "BETS", "BETA2SER", "GAMS", "MSPIKE", "SPEI" Lab Results  Component Value Date   TIBC 310 09/30/2022   TIBC 323 04/14/2022   TIBC 373 10/01/2021   FERRITIN 108 09/30/2022   FERRITIN 65 04/14/2022   FERRITIN 49 10/01/2021  IRONPCTSAT 13 (L) 09/30/2022   IRONPCTSAT 22 04/14/2022   IRONPCTSAT 27 10/01/2021   Lab Results  Component Value Date   LDH 120 06/28/2021   LDH 130 12/20/2020   LDH 97 (L) 07/12/2018     STUDIES:   NM PET Image Restag (PS) Skull Base To Thigh  Result Date: 01/19/2023 CLINICAL DATA:  Subsequent treatment strategy for stage IV left upper lobe lung adenocarcinoma metastatic to the right scapula diagnosed September-October 2023 status post radiation therapy to the right scapula completed 08/18/2022  with ongoing immunotherapy. Additional history of right upper lobectomy for lung cancer in 2012 and radiation therapy to left lung adenocarcinoma in 2015. EXAM: NUCLEAR MEDICINE PET SKULL BASE TO THIGH TECHNIQUE: 7.2 mCi F-18 FDG was injected intravenously. Full-ring PET imaging was performed from the skull base to thigh after the radiotracer. CT data was obtained and used for attenuation correction and anatomic localization. Fasting blood glucose: 93 mg/dl COMPARISON:  16/06/9603 PET-CT. FINDINGS: Mediastinal blood pool activity: SUV max 1.9 Liver activity: SUV max NA NECK: No hypermetabolic lymph nodes in the neck. Incidental CT findings: Right internal jugular Port-A-Cath terminates at the cavoatrial junction. CHEST: No enlarged or hypermetabolic axillary, mediastinal or hilar lymph nodes. No hypermetabolic pulmonary findings. Posterior left upper lobe 0.8 cm nodular focus of consolidation adjacent to fiducial marker (series 7/image 24) without associated FDG uptake, slightly decreased from 1.0 cm on 10/23/2022 PET-CT. Apical left upper lobe indistinct 0.4 cm pulmonary nodule (series 7/image 18), below PET resolution, unchanged. Chronic bandlike consolidation in the anterior left lower lobe with associated mild volume loss and distortion, unchanged and compatible with postradiation change. No new significant pulmonary nodules. Incidental CT findings: Coronary atherosclerosis. Atherosclerotic nonaneurysmal thoracic aorta. Status post right upper lobectomy. Moderate centrilobular emphysema. ABDOMEN/PELVIS: No abnormal hypermetabolic activity within the liver, pancreas, adrenal glands, or spleen. No hypermetabolic lymph nodes in the abdomen or pelvis. Incidental CT findings: Large hiatal hernia. Stomach is nondistended. Cholelithiasis. Chronic dilated common bile duct with 9 mm diameter, unchanged. Posterior upper left renal simple 1.8 cm cyst, for which no follow-up imaging is recommended. Atherosclerotic  nonaneurysmal abdominal aorta. Moderate colonic stool. SKELETON: No new focal skeletal hypermetabolic activity. Low level FDG uptake at the inferior right scapula at the site of healing sclerotic pathologic fracture with max SUV 2.2, previous max SUV 3.9, decreased. Incidental CT findings: Bilateral posterior spinal fusion hardware in the lumbosacral spine. IMPRESSION: 1. No evidence of new or progressive hypermetabolic metastatic disease. 2. Low level FDG uptake at the site of the healing sclerotic pathologic fracture in the inferior right scapula, decreased, compatible with post treatment change. 3. Posterior left upper lobe 0.8 cm nodular focus of consolidation adjacent to fiducial marker without associated FDG uptake, slightly decreased from 1.0 cm on 10/23/2022 PET-CT, compatible with treated tumor. 4. Chronic postradiation change in the anterior left lower lobe. 5. Chronic findings include: Large hiatal hernia. Cholelithiasis. Coronary atherosclerosis. Moderate colonic stool, suggesting constipation. Aortic Atherosclerosis (ICD10-I70.0) and Emphysema (ICD10-J43.9). Electronically Signed   By: Delbert Phenix M.D.   On: 01/19/2023 13:03

## 2023-02-11 ENCOUNTER — Inpatient Hospital Stay (HOSPITAL_BASED_OUTPATIENT_CLINIC_OR_DEPARTMENT_OTHER): Payer: Medicare Other | Admitting: Hematology

## 2023-02-11 ENCOUNTER — Inpatient Hospital Stay: Payer: Medicare Other

## 2023-02-11 ENCOUNTER — Inpatient Hospital Stay: Payer: Medicare Other | Attending: Physician Assistant

## 2023-02-11 DIAGNOSIS — Z5112 Encounter for antineoplastic immunotherapy: Secondary | ICD-10-CM | POA: Diagnosis not present

## 2023-02-11 DIAGNOSIS — Z95828 Presence of other vascular implants and grafts: Secondary | ICD-10-CM | POA: Diagnosis not present

## 2023-02-11 DIAGNOSIS — D518 Other vitamin B12 deficiency anemias: Secondary | ICD-10-CM | POA: Diagnosis not present

## 2023-02-11 DIAGNOSIS — C3412 Malignant neoplasm of upper lobe, left bronchus or lung: Secondary | ICD-10-CM

## 2023-02-11 DIAGNOSIS — Z0001 Encounter for general adult medical examination with abnormal findings: Secondary | ICD-10-CM | POA: Diagnosis not present

## 2023-02-11 DIAGNOSIS — G9332 Myalgic encephalomyelitis/chronic fatigue syndrome: Secondary | ICD-10-CM | POA: Diagnosis not present

## 2023-02-11 DIAGNOSIS — E538 Deficiency of other specified B group vitamins: Secondary | ICD-10-CM | POA: Diagnosis not present

## 2023-02-11 DIAGNOSIS — Z87891 Personal history of nicotine dependence: Secondary | ICD-10-CM | POA: Insufficient documentation

## 2023-02-11 DIAGNOSIS — Z79620 Long term (current) use of immunosuppressive biologic: Secondary | ICD-10-CM | POA: Diagnosis not present

## 2023-02-11 DIAGNOSIS — C7951 Secondary malignant neoplasm of bone: Secondary | ICD-10-CM | POA: Insufficient documentation

## 2023-02-11 DIAGNOSIS — Z125 Encounter for screening for malignant neoplasm of prostate: Secondary | ICD-10-CM | POA: Diagnosis not present

## 2023-02-11 DIAGNOSIS — E559 Vitamin D deficiency, unspecified: Secondary | ICD-10-CM | POA: Diagnosis not present

## 2023-02-11 DIAGNOSIS — E785 Hyperlipidemia, unspecified: Secondary | ICD-10-CM | POA: Diagnosis not present

## 2023-02-11 LAB — CBC WITH DIFFERENTIAL/PLATELET
Abs Immature Granulocytes: 0.11 10*3/uL — ABNORMAL HIGH (ref 0.00–0.07)
Basophils Absolute: 0.1 10*3/uL (ref 0.0–0.1)
Basophils Relative: 1 %
Eosinophils Absolute: 0.1 10*3/uL (ref 0.0–0.5)
Eosinophils Relative: 1 %
HCT: 42.8 % (ref 39.0–52.0)
Hemoglobin: 14.3 g/dL (ref 13.0–17.0)
Immature Granulocytes: 1 %
Lymphocytes Relative: 6 %
Lymphs Abs: 0.7 10*3/uL (ref 0.7–4.0)
MCH: 32.7 pg (ref 26.0–34.0)
MCHC: 33.4 g/dL (ref 30.0–36.0)
MCV: 97.9 fL (ref 80.0–100.0)
Monocytes Absolute: 0.5 10*3/uL (ref 0.1–1.0)
Monocytes Relative: 4 %
Neutro Abs: 10.7 10*3/uL — ABNORMAL HIGH (ref 1.7–7.7)
Neutrophils Relative %: 87 %
Platelets: 242 10*3/uL (ref 150–400)
RBC: 4.37 MIL/uL (ref 4.22–5.81)
RDW: 13.3 % (ref 11.5–15.5)
WBC: 12.3 10*3/uL — ABNORMAL HIGH (ref 4.0–10.5)
nRBC: 0 % (ref 0.0–0.2)

## 2023-02-11 LAB — COMPREHENSIVE METABOLIC PANEL
ALT: 29 U/L (ref 0–44)
AST: 22 U/L (ref 15–41)
Albumin: 4.3 g/dL (ref 3.5–5.0)
Alkaline Phosphatase: 79 U/L (ref 38–126)
Anion gap: 12 (ref 5–15)
BUN: 30 mg/dL — ABNORMAL HIGH (ref 8–23)
CO2: 21 mmol/L — ABNORMAL LOW (ref 22–32)
Calcium: 9 mg/dL (ref 8.9–10.3)
Chloride: 101 mmol/L (ref 98–111)
Creatinine, Ser: 1.21 mg/dL (ref 0.61–1.24)
GFR, Estimated: >60 mL/min (ref >60)
Glucose, Bld: 136 mg/dL — ABNORMAL HIGH (ref 70–99)
Potassium: 3.2 mmol/L — ABNORMAL LOW (ref 3.5–5.1)
Sodium: 134 mmol/L — ABNORMAL LOW (ref 135–145)
Total Bilirubin: 1 mg/dL (ref 0.3–1.2)
Total Protein: 7.4 g/dL (ref 6.5–8.1)

## 2023-02-11 LAB — MAGNESIUM: Magnesium: 1.9 mg/dL (ref 1.7–2.4)

## 2023-02-11 MED ORDER — SODIUM CHLORIDE 0.9% FLUSH
10.0000 mL | Freq: Once | INTRAVENOUS | Status: AC
  Administered 2023-02-11: 10 mL via INTRAVENOUS

## 2023-02-11 NOTE — Progress Notes (Signed)
Per Dr Ellin Saba, no treatment today due to immunotherapy related diarrhea. Port flushed, heparin locked, and de-accessed prior to departure. Patient ambulatory to lobby with family.

## 2023-02-11 NOTE — Patient Instructions (Addendum)
[facility] at [facility] Discharge Instructions   You were seen and examined today by Dr. Ellin Saba.  He reviewed the results of your lab work which are normal/stable.   We will hold your treatment today.   Return as scheduled.    Thank you for choosing [facility] at [facility] to provide your oncology and hematology care.  To afford each patient quality time with our provider, please arrive at least 15 minutes before your scheduled appointment time.   If you have a lab appointment with the Cancer Center please come in thru the Main Entrance and check in at the main information desk.  You need to re-schedule your appointment should you arrive 10 or more minutes late.  We strive to give you quality time with our providers, and arriving late affects you and other patients whose appointments are after yours.  Also, if you no show three or more times for appointments you may be dismissed from the clinic at the providers discretion.     Again, thank you for choosing [facility].  Our hope is that these requests will decrease the amount of time that you wait before being seen by our physicians.       _____________________________________________________________  Should you have questions after your visit to [facility], please contact our office at [phone] and follow the prompts.  Our office hours are 8:00 a.m. and 4:30 p.m. Monday - Friday.  Please note that voicemails left after 4:00 p.m. may not be returned until the following business day.  We are closed weekends and major holidays.  You do have access to a nurse 24-7, just call the main number to the clinic [phone] and do not press any options, hold on the line and a nurse will answer the phone.    For prescription refill requests, have your pharmacy contact our office and allow 72 hours.    Due to Covid, you will need to wear a mask upon entering the  hospital. If you do not have a mask, a mask will be given to you at the Main Entrance upon arrival. For doctor visits, patients may have 1 support person age 76 or older with them. For treatment visits, patients can not have anyone with them due to social distancing guidelines and our immunocompromised population.

## 2023-02-14 ENCOUNTER — Other Ambulatory Visit: Payer: Self-pay | Admitting: Hematology

## 2023-03-02 ENCOUNTER — Other Ambulatory Visit: Payer: Self-pay

## 2023-03-02 DIAGNOSIS — C3412 Malignant neoplasm of upper lobe, left bronchus or lung: Secondary | ICD-10-CM

## 2023-03-02 DIAGNOSIS — C3490 Malignant neoplasm of unspecified part of unspecified bronchus or lung: Secondary | ICD-10-CM

## 2023-03-03 NOTE — Progress Notes (Signed)
[facility] [address] [place] [address]    Clinic Day:  03/05/2023  Referring physician: Doreatha Massed, MD  Patient Care Team: Elfredia Nevins, MD as PCP - General (Internal Medicine) Marykay Lex, MD as PCP - Cardiology (Cardiology) Malissa Hippo, MD (Inactive) as Consulting Physician (Gastroenterology) Lonie Peak, MD as Attending Physician (Radiation Oncology)   ASSESSMENT & PLAN:   Assessment: 1.  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 - Status post right upper lobectomy in February 2012 for right lung cancer. - LDCT chest (04/14/2022): Enlarging spiculated nodular soft tissue thickening in the posterior left upper lobe measuring 11.4 mm. - PET scan (04/24/2022): Left upper lobe lung nodule 14 mm with SUV 3.62.  Hypermetabolic focus in the inferior aspect of the right scapula without discrete CT correlate with SUV 5.71.  No hypermetabolic adenopathy. - LUL lesion FNA (06/03/2022): Adenocarcinoma - Right scapula biopsy (07/03/2022) adenocarcinoma consistent with lung primary - NGS: PD-L1 22 C3 TPS 100%, T p53 pathogenic variant, MSI-stable, TMB-low, no other targetable mutations - Cycle 1 of pembrolizumab started on 07/21/2022 - XRT to the right scapular lesion and left upper lobe lung lesion 5 fractions completed on 08/18/2022.   2.  Cystic lesion of left kidney - CTA PE on 04/14/2022 showed complex cystic lesion of the left kidney.  PET scan also showed exophytic 18 mm left renal lesion suspicious for malignancy. - MRI abdomen on 05/16/2022 showed benign appearing Bosniak category 1 and 2 left renal cyst with no evidence of neoplasm.  No further work-up needed.    Plan: 1.  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  Low-level uptake at the site of  healing sclerotic pathological fracture in the inferior right scapula. - At last visit, he was having diarrhea.  This has completely resolved even without steroids. - Diarrhea and likely from immunotherapy. - Reviewed labs today: Normal LFTs and creatinine and electrolytes.  CBC grossly normal.  Last TSH 1.2. - Proceed with Keytruda today and in 3 weeks.  RTC 6 weeks for follow-up.   2.  Left posterior chest wall rash: - This has improved.  Continue prednisone 5 mg daily.  Any worsening, call us.   3.  B12 deficiency - Continue B12 every other day and iron tablet daily.   4.  Hypokalemia: - Continue potassium 20 mEq daily.  Potassium is 3.4 today.    Orders Placed This Encounter  Procedures   Magnesium    Standing Status:   Future    Standing Expiration Date:   08/18/2024   CBC with Differential    Standing Status:   Future    Standing Expiration Date:   08/18/2024   Comprehensive metabolic panel    Standing Status:   Future    Standing Expiration Date:   08/18/2024   TSH    Standing Status:   Future    Standing Expiration Date:   08/18/2024      I,Katie Daubenspeck,acting as a scribe for Doreatha Massed, MD.,have documented all relevant documentation on the behalf of Doreatha Massed, MD,as directed by  Doreatha Massed, MD while in the presence of Doreatha Massed, MD.   I, Doreatha Massed MD, have reviewed the above documentation for accuracy and completeness, and I agree with the above.   Doreatha Massed, MD  6/27/20246:08 PM  CHIEF COMPLAINT:   Diagnosis: bilateral lung cancers, normocytic anemia, and B12 deficiency    Cancer Staging  Adenocarcinoma of lung (HCC) Staging form: Lung, AJCC 7th Edition - Clinical: Stage IA (T1a, N0, M0) - Signed by [name], PA on 05/06/2011  Adenocarcinoma of upper lobe of left lung [name]) Staging form: Lung, AJCC 8th Edition - Clinical stage from 07/16/2022: Stage IVA (cT1b, cN0, pM1b) - Unsigned     Prior Therapy: 1. Right upper lobectomy (February 2012) for right lung cancer 2. SBRT (November 2015) for stage I left adenocarcinoma  Current Therapy:  Pembrolizumab    HISTORY OF PRESENT ILLNESS:   Oncology History  Adenocarcinoma of lung (HCC)  09/30/2010 Cancer Staging   CT of chest showed spiculated lesion   10/15/2010 Surgery   Right upper lobectomy by Dr. Edwyna Shell   10/16/2010 Remission     10/24/2013 Imaging   CT Chest-  Enlarging subpleural nodule in the left lower lobe, now 9 mm, with distortion of the overlying pleura. Finding is concerning for a small bronchogenic carcinoma   01/23/2014 Imaging   CT Chest- Left lower lobe nodule has enlarged from 11/01/2012 and is worrisome for primary bronchogenic carcinoma.    07/04/2014 - 07/10/2014 Radiation Therapy   SBRT by Dr. Basilio Cairo in 3 fractions.   07/05/2015 Imaging   perifissural nodule in LLL appears slightly smaller with parenchymal opacity attrib to XRT, likely inflamm pathcy airspace dz more inferiorly in LLL, airspace dz in RLL resolved. no metastatic disease   Primary cancer of left lower lobe of lung (HCC)  06/18/2014 Initial Diagnosis   Left lower lobe nodule suspicious for primary lung carcinoma   07/04/2014 - 07/10/2014 Radiation Therapy   SBRT   10/12/2014 Imaging   No acute findings within the chest. Decrease in size of left lower lobe perifissural nodule compatible with response to therapy. No new or progressive disease identified within the chest.   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)  07/16/2022 Initial Diagnosis   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)   07/21/2022 -  Chemotherapy   Patient is on Treatment Plan : LUNG NSCLC Pembrolizumab (200) q21d        INTERVAL HISTORY:   Maurice Orozco is a 77 y.o. male presenting to clinic today for follow up of bilateral lung cancers, normocytic anemia, and B12 deficiency. He was last seen by me on 02/11/23.  Today, he states that he is doing well  overall. His appetite level is at 100%. His energy level is at 50%.  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 dense RCA calcification --Non-ischemic Myoview   History of kidney stones    in the 70's   History of radiation therapy 07/04/14, 07/06/14, 07/10/14   LLL lung nodule/54 Gy/3 fx- SBRT   Hypercholesterolemia 05/06/2011   Pneumonia due to Streptococcus  HX. POST OPERATIVELY   Port-A-Cath in place 07/21/2022   Pre-diabetes    Stress fx pelvis    DUE TO AA IN 1970    Surgical History: Past Surgical History:  Procedure Laterality Date   BACK SURGERY  07/04/2017   fusion above previous surgery   BACK SURGERY  2011   metal plate Z-6,X0   BRONCHIAL BIOPSY  06/03/2022   Procedure: BRONCHIAL BIOPSIES;  Surgeon: Josephine Igo, DO;  Location: [facility];  Service: Pulmonary;;   BRONCHIAL BRUSHINGS  06/03/2022   Procedure: BRONCHIAL BRUSHINGS;  Surgeon: Josephine Igo, DO;  Location: [facility];  Service: Pulmonary;;   BRONCHIAL NEEDLE ASPIRATION BIOPSY  06/03/2022   Procedure: BRONCHIAL NEEDLE ASPIRATION BIOPSIES;  Surgeon: Josephine Igo, DO;  Location: [facility];  Service: Pulmonary;;   Coroanry Calcium Score  03/2017   1043. Noted especially in RCA. Dense calcification, recommend Myoview over Cor CTA.   FIDUCIAL MARKER PLACEMENT  06/03/2022   Procedure: FIDUCIAL MARKER PLACEMENT;  Surgeon: Josephine Igo, DO;  Location: [facility];  Service:  Pulmonary;;   IR IMAGING GUIDED PORT INSERTION  08/04/2022   LIPOMA EXCISION N/A 10/14/2017   Procedure: EXCISION LIPOMA ON BACK;  Surgeon: Lucretia Roers, MD;  Location: [facility];  Service: General;  Laterality: N/A;   LIPOMA EXCISION Left 11/19/2018   Procedure: MINOR EXCISION OF SEBACEOUS CYST NECK;  Surgeon: Lucretia Roers, MD;  Location: [facility];  Service: General;  Laterality: Left;  pt knows to arrive at 7:00   LUNG LOBECTOMY  2012   RT UPPER LOBE   NM MYOVIEW LTD  04/2017   EF 60%. Hypertensive response to exercise. (6:21 min, 7.7 METs) No EKG changes. LOW RISK    Social History: Social History   Socioeconomic History   Marital status: Married    Spouse name: Not on file   Number of children: 3   Years of education: Not on file   Highest education level: Not on file  Occupational History   Not on file  Tobacco Use   Smoking status: Former    Packs/day: 1.50    Years: 59.00    Additional pack years: 0.00    Total pack years: 88.50    Types: Cigarettes    Quit date: 09/08/2010    Years since quitting: 12.4   Smokeless tobacco: Current    Types: Chew   Tobacco comments:    still chews tobacco, he tells me he hasn't in the past 2 days.   Vaping Use   Vaping Use: Never used  Substance and Sexual Activity   Alcohol use: No   Drug use: No   Sexual activity: Not Currently  Other Topics Concern   Not on file  Social History Narrative   Not on file   Social Determinants of Health   Financial Resource Strain: Not on file  Food Insecurity: Not on file  Transportation Needs: No Transportation Needs (07/13/2018)   PRAPARE - Administrator, Civil Service (Medical): No    Lack of Transportation (Non-Medical): No  Physical Activity: Not on file  Stress: Not on file  Social Connections: Not on file  Intimate Partner Violence: Not At Risk (07/13/2018)   Humiliation, Afraid, Rape, and Kick questionnaire    Fear of Current or Ex-Partner: No    Emotionally  Abused: No    Physically Abused: No    Sexually Abused: No    Family History: Family History  Problem Relation Age of  Onset   Hypertension Mother    Kidney disease Father    Heart attack Brother    Cancer Maternal Uncle        prostate cancer   Cancer Maternal Uncle        prostate cancer   Cancer Maternal Uncle        prostate cancer   Cancer Maternal Uncle        prostate cancer    Current Medications:  Current Outpatient Medications:    albuterol (PROVENTIL HFA;VENTOLIN HFA) 108 (90 Base) MCG/ACT inhaler, Inhale 2 puffs into the lungs every 4 (four) hours as needed for wheezing or shortness of breath., Disp: 2 Inhaler, Rfl: 3   albuterol (PROVENTIL) (2.5 MG/3ML) 0.083% nebulizer solution, Take 2.5 mg by nebulization every 6 (six) hours as needed for wheezing or shortness of breath., Disp: , Rfl:    betamethasone dipropionate 0.05 % cream, Apply topically 2 (two) times daily., Disp: 30 g, Rfl: 1   cholecalciferol (VITAMIN D3) 25 MCG (1000 UNIT) tablet, Take 1,000 Units by mouth daily., Disp: , Rfl:    cimetidine (TAGAMET) 200 MG tablet, Take 400-600 mg by mouth daily as needed (for heartburn)., Disp: , Rfl:    clobetasol ointment (TEMOVATE) 0.05 %, Apply topically 2 (two) times daily as needed., Disp: , Rfl:    ferrous sulfate 325 (65 FE) MG tablet, Take 325 mg by mouth daily with breakfast., Disp: , Rfl:    fluticasone (FLONASE) 50 MCG/ACT nasal spray, Place 2 sprays into both nostrils daily as needed for rhinitis., Disp: , Rfl:    guaiFENesin (MUCINEX) 600 MG 12 hr tablet, Take 600 mg by mouth daily., Disp: , Rfl:    hydrochlorothiazide (HYDRODIURIL) 25 MG tablet, Take 12.5 mg by mouth daily., Disp: , Rfl:    HYDROcodone-acetaminophen (NORCO) 10-325 MG tablet, Take 1-2 tablets by mouth every 4 (four) hours as needed for moderate pain., Disp: 240 tablet, Rfl: 0   hydrOXYzine (ATARAX) 10 MG tablet, TAKE 1 TABLET BY MOUTH THREE TIMES A DAY AS NEEDED, Disp: 30 tablet, Rfl: 0    hydrOXYzine (VISTARIL) 25 MG capsule, Take 25-50 mg by mouth at bedtime as needed., Disp: , Rfl:    KLOR-CON M20 20 MEQ tablet, TAKE 1 TABLET BY MOUTH EVERY DAY, Disp: 90 tablet, Rfl: 1   lidocaine-prilocaine (EMLA) cream, APPLY A SMALL AMOUNT TO PORT A CATH SITE AND COVER WITH PLASTIC WRAP 1 HOUR PRIOR TO INFUSION APPOINTMENTS, Disp: 30 g, Rfl: 3   Menthol, Topical Analgesic, (ICY HOT EX), Apply 1 Application topically daily as needed (pain)., Disp: , Rfl:    naproxen sodium (ALEVE) 220 MG tablet, Take 220 mg by mouth daily as needed (pain)., Disp: , Rfl:    Omega-3 Fatty Acids (FISH OIL) 1000 MG CAPS, Take 1,000 mg by mouth daily., Disp: , Rfl:    OVER THE COUNTER MEDICATION, Take 1 capsule by mouth daily., Disp: , Rfl:    Oxycodone HCl 10 MG TABS, Take 10 mg by mouth 2 (two) times daily as needed (pain)., Disp: , Rfl:    pantoprazole (PROTONIX) 40 MG tablet, Take 40 mg by mouth See admin instructions. Take 40 mg daily, may take a second 40 mg dose as needed for heartburn, Disp: , Rfl:    PEMBROLIZUMAB IV, Inject into the vein every 21 ( twenty-one) days., Disp: , Rfl:    polyethylene glycol powder (GLYCOLAX/MIRALAX) 17 GM/SCOOP powder, Take 17 g by mouth daily as needed for mild constipation or moderate constipation., Disp: ,  Rfl:    predniSONE (DELTASONE) 5 MG tablet, Take 1 tablet (5 mg total) by mouth daily with breakfast., Disp: 30 tablet, Rfl: 4   prochlorperazine (COMPAZINE) 10 MG tablet, Take 1 tablet (10 mg total) by mouth every 6 (six) hours as needed for nausea or vomiting., Disp: 30 tablet, Rfl: 1   rosuvastatin (CRESTOR) 40 MG tablet, Take 40 mg by mouth at bedtime., Disp: , Rfl:    tamsulosin (FLOMAX) 0.4 MG CAPS capsule, TAKE 1 CAPSULE BY MOUTH EVERY DAY, Disp: 90 capsule, Rfl: 1   triamcinolone cream (KENALOG) 0.1 %, Apply 1 Application topically daily as needed (tick bite/itching)., Disp: , Rfl:    triazolam (HALCION) 0.25 MG tablet, Take 0.25 mg by mouth at bedtime., Disp: , Rfl:     vitamin B-12 (CYANOCOBALAMIN) 1000 MCG tablet, Take 1,000 mcg by mouth every other day., Disp: , Rfl:  No current facility-administered medications for this visit.  Facility-Administered Medications Ordered in Other Visits:    sodium chloride flush (NS) 0.9 % injection 10 mL, 10 mL, Intracatheter, PRN, Doreatha Massed, MD, 10 mL at 10/07/22 1523   sodium chloride flush (NS) 0.9 % injection 10 mL, 10 mL, Intracatheter, PRN, Doreatha Massed, MD, 10 mL at 10/28/22 1446   sodium chloride flush (NS) 0.9 % injection 10 mL, 10 mL, Intracatheter, PRN, Doreatha Massed, MD, 10 mL at 11/19/22 1547   Allergies: Allergies  Allergen Reactions   Fentanyl Other (See Comments)    CAUSED HALLUCINATIONS/05-03-13 pt reports it was oral fentanyl that caused hallucinations    Gabapentin Other (See Comments)    Bones throbbed, headaches, weakness   Nabumetone     Unknown reaction    Aspirin Nausea Only    REVIEW OF SYSTEMS:   Review of Systems  Constitutional:  Negative for chills, fatigue and fever.  HENT:   Negative for lump/mass, mouth sores, nosebleeds, sore throat and trouble swallowing.   Eyes:  Negative for eye problems.  Respiratory:  Negative for cough and shortness of breath.   Cardiovascular:  Negative for chest pain, leg swelling and palpitations.  Gastrointestinal:  Negative for abdominal pain, constipation, diarrhea, nausea and vomiting.  Genitourinary:  Negative for bladder incontinence, difficulty urinating, dysuria, frequency, hematuria and nocturia.   Musculoskeletal:  Positive for back pain. Negative for arthralgias, flank pain, myalgias and neck pain.  Skin:  Negative for itching and rash.  Neurological:  Negative for dizziness, headaches and numbness.  Hematological:  Does not bruise/bleed easily.  Psychiatric/Behavioral:  Positive for sleep disturbance. Negative for depression and suicidal ideas. The patient is not nervous/anxious.   All other systems reviewed and  are negative.    VITALS:   There were no vitals taken for this visit.  Wt Readings from Last 3 Encounters:  03/04/23 141 lb 12.8 oz (64.3 kg)  02/11/23 137 lb 9.6 oz (62.4 kg)  01/21/23 142 lb 9.6 oz (64.7 kg)    There is no height or weight on file to calculate BMI.  Performance status (ECOG): 1 - Symptomatic but completely ambulatory  PHYSICAL EXAM:   Physical Exam Vitals and nursing note reviewed. Exam conducted with a chaperone present.  Constitutional:      Appearance: Normal appearance.  Cardiovascular:     Rate and Rhythm: Normal rate and regular rhythm.     Pulses: Normal pulses.     Heart sounds: Normal heart sounds.  Pulmonary:     Effort: Pulmonary effort is normal.     Breath sounds: Normal breath sounds.  Abdominal:     Palpations: Abdomen is soft. There is no hepatomegaly, splenomegaly or mass.     Tenderness: There is no abdominal tenderness.  Musculoskeletal:     Right lower leg: No edema.     Left lower leg: No edema.  Lymphadenopathy:     Cervical: No cervical adenopathy.     Right cervical: No superficial, deep or posterior cervical adenopathy.    Left cervical: No superficial, deep or posterior cervical adenopathy.     Upper Body:     Right upper body: No supraclavicular or axillary adenopathy.     Left upper body: No supraclavicular or axillary adenopathy.  Neurological:     General: No focal deficit present.     Mental Status: He is alert and oriented to person, place, and time.  Psychiatric:        Mood and Affect: Mood normal.        Behavior: Behavior normal.     LABS:      Latest Ref Rng & Units 03/04/2023   12:03 PM 02/11/2023   11:57 AM 01/21/2023   11:45 AM  CBC  WBC 4.0 - 10.5 K/uL 12.5  12.3  12.3   Hemoglobin 13.0 - 17.0 g/dL 16.1  09.6  04.5   Hematocrit 39.0 - 52.0 % 41.7  42.8  39.3   Platelets 150 - 400 K/uL 242  242  197       Latest Ref Rng & Units 03/04/2023   12:03 PM 02/11/2023   11:57 AM 01/21/2023   11:45 AM  CMP   Glucose 70 - 99 mg/dL 409  811  914   BUN 8 - 23 mg/dL 15  30  19    Creatinine 0.61 - 1.24 mg/dL 7.82  9.56  2.13   Sodium 135 - 145 mmol/L 135  134  135   Potassium 3.5 - 5.1 mmol/L 3.4  3.2  3.6   Chloride 98 - 111 mmol/L 101  101  102   CO2 22 - 32 mmol/L 24  21  25    Calcium 8.9 - 10.3 mg/dL 9.1  9.0  8.7   Total Protein 6.5 - 8.1 g/dL 7.2  7.4  6.5   Total Bilirubin 0.3 - 1.2 mg/dL 0.8  1.0  0.7   Alkaline Phos 38 - 126 U/L 76  79  83   AST 15 - 41 U/L 22  22  19    ALT 0 - 44 U/L 36  29  25      No results found for: "CEA1", "CEA" / No results found for: "CEA1", "CEA" No results found for: "PSA1" No results found for: "YQM578" No results found for: "CAN125"  No results found for: "TOTALPROTELP", "ALBUMINELP", "A1GS", "A2GS", "BETS", "BETA2SER", "GAMS", "MSPIKE", "SPEI" Lab Results  Component Value Date   TIBC 310 09/30/2022   TIBC 323 04/14/2022   TIBC 373 10/01/2021   FERRITIN 108 09/30/2022   FERRITIN 65 04/14/2022   FERRITIN 49 10/01/2021   IRONPCTSAT 13 (L) 09/30/2022   IRONPCTSAT 22 04/14/2022   IRONPCTSAT 27 10/01/2021   Lab Results  Component Value Date   LDH 120 06/28/2021   LDH 130 12/20/2020   LDH 97 (L) 07/12/2018     STUDIES:   No results found.

## 2023-03-04 ENCOUNTER — Inpatient Hospital Stay: Payer: Medicare Other

## 2023-03-04 ENCOUNTER — Inpatient Hospital Stay (HOSPITAL_BASED_OUTPATIENT_CLINIC_OR_DEPARTMENT_OTHER): Payer: Medicare Other | Admitting: Hematology

## 2023-03-04 ENCOUNTER — Encounter: Payer: Self-pay | Admitting: Hematology

## 2023-03-04 VITALS — BP 133/64 | HR 81 | Temp 98.0°F | Resp 17

## 2023-03-04 DIAGNOSIS — Z95828 Presence of other vascular implants and grafts: Secondary | ICD-10-CM

## 2023-03-04 DIAGNOSIS — C3412 Malignant neoplasm of upper lobe, left bronchus or lung: Secondary | ICD-10-CM

## 2023-03-04 DIAGNOSIS — Z87891 Personal history of nicotine dependence: Secondary | ICD-10-CM | POA: Diagnosis not present

## 2023-03-04 DIAGNOSIS — C7951 Secondary malignant neoplasm of bone: Secondary | ICD-10-CM | POA: Diagnosis not present

## 2023-03-04 DIAGNOSIS — E538 Deficiency of other specified B group vitamins: Secondary | ICD-10-CM | POA: Diagnosis not present

## 2023-03-04 DIAGNOSIS — Z5112 Encounter for antineoplastic immunotherapy: Secondary | ICD-10-CM | POA: Diagnosis not present

## 2023-03-04 DIAGNOSIS — Z79620 Long term (current) use of immunosuppressive biologic: Secondary | ICD-10-CM | POA: Diagnosis not present

## 2023-03-04 LAB — COMPREHENSIVE METABOLIC PANEL
ALT: 36 U/L (ref 0–44)
AST: 22 U/L (ref 15–41)
Albumin: 4.2 g/dL (ref 3.5–5.0)
Alkaline Phosphatase: 76 U/L (ref 38–126)
Anion gap: 10 (ref 5–15)
BUN: 15 mg/dL (ref 8–23)
CO2: 24 mmol/L (ref 22–32)
Calcium: 9.1 mg/dL (ref 8.9–10.3)
Chloride: 101 mmol/L (ref 98–111)
Creatinine, Ser: 0.95 mg/dL (ref 0.61–1.24)
GFR, Estimated: >60 mL/min (ref >60)
Glucose, Bld: 115 mg/dL — ABNORMAL HIGH (ref 70–99)
Potassium: 3.4 mmol/L — ABNORMAL LOW (ref 3.5–5.1)
Sodium: 135 mmol/L (ref 135–145)
Total Bilirubin: 0.8 mg/dL (ref 0.3–1.2)
Total Protein: 7.2 g/dL (ref 6.5–8.1)

## 2023-03-04 LAB — CBC WITH DIFFERENTIAL/PLATELET
Abs Immature Granulocytes: 0.11 10*3/uL — ABNORMAL HIGH (ref 0.00–0.07)
Basophils Absolute: 0.1 10*3/uL (ref 0.0–0.1)
Basophils Relative: 1 %
Eosinophils Absolute: 0.2 10*3/uL (ref 0.0–0.5)
Eosinophils Relative: 1 %
HCT: 41.7 % (ref 39.0–52.0)
Hemoglobin: 13.7 g/dL (ref 13.0–17.0)
Immature Granulocytes: 1 %
Lymphocytes Relative: 6 %
Lymphs Abs: 0.7 10*3/uL (ref 0.7–4.0)
MCH: 32.8 pg (ref 26.0–34.0)
MCHC: 32.9 g/dL (ref 30.0–36.0)
MCV: 99.8 fL (ref 80.0–100.0)
Monocytes Absolute: 0.7 10*3/uL (ref 0.1–1.0)
Monocytes Relative: 6 %
Neutro Abs: 10.7 10*3/uL — ABNORMAL HIGH (ref 1.7–7.7)
Neutrophils Relative %: 85 %
Platelets: 242 10*3/uL (ref 150–400)
RBC: 4.18 MIL/uL — ABNORMAL LOW (ref 4.22–5.81)
RDW: 13.4 % (ref 11.5–15.5)
WBC: 12.5 10*3/uL — ABNORMAL HIGH (ref 4.0–10.5)
nRBC: 0 % (ref 0.0–0.2)

## 2023-03-04 LAB — MAGNESIUM: Magnesium: 2 mg/dL (ref 1.7–2.4)

## 2023-03-04 MED ORDER — SODIUM CHLORIDE 0.9% FLUSH
10.0000 mL | INTRAVENOUS | Status: DC | PRN
  Administered 2023-03-04: 10 mL

## 2023-03-04 MED ORDER — HEPARIN SOD (PORK) LOCK FLUSH 100 UNIT/ML IV SOLN
500.0000 [IU] | Freq: Once | INTRAVENOUS | Status: AC | PRN
  Administered 2023-03-04: 500 [IU]

## 2023-03-04 MED ORDER — SODIUM CHLORIDE 0.9% FLUSH
10.0000 mL | Freq: Once | INTRAVENOUS | Status: AC
  Administered 2023-03-04: 10 mL via INTRAVENOUS

## 2023-03-04 MED ORDER — SODIUM CHLORIDE 0.9 % IV SOLN
200.0000 mg | Freq: Once | INTRAVENOUS | Status: AC
  Administered 2023-03-04: 200 mg via INTRAVENOUS
  Filled 2023-03-04: qty 8

## 2023-03-04 MED ORDER — SODIUM CHLORIDE 0.9 % IV SOLN: Freq: Once | INTRAVENOUS | Status: AC

## 2023-03-04 NOTE — Progress Notes (Signed)
Patient presents today for follow up visit with Dr. Ellin Saba and Rande Lawman. Labs within parameters for treatment. Vital signa within parameters for treatment. Potassium 3.4. Patient will receive 40 mEq PO potassium per Dr. Marice Potter standing orders.   Message received from Dr. Ellin Saba / A. Anderson RN to proceed with treatment.   Treatment given today per MD orders. Tolerated infusion without adverse affects. Vital signs stable. No complaints at this time. Discharged from clinic ambulatory in stable condition. Alert and oriented x 3. F/U with [facility] as scheduled.

## 2023-03-04 NOTE — Progress Notes (Unsigned)
Patient has been examined by Dr. Katragadda. Vital signs and labs have been reviewed by MD - ANC, Creatinine, LFTs, hemoglobin, and platelets are within treatment parameters per M.D. - pt may proceed with treatment.  Primary RN and pharmacy notified.  

## 2023-03-04 NOTE — Patient Instructions (Signed)
[facility] [place]  Discharge Instructions: Thank you for choosing [facility] to provide your oncology and hematology care.  If you have a lab appointment with the Cancer Center - please note that after April 8th, 2024, all labs will be drawn in the cancer center.  You do not have to check in or register with the main entrance as you have in the past but will complete your check-in in the cancer center.  Wear comfortable clothing and clothing appropriate for easy access to any Portacath or PICC line.   We strive to give you quality time with your provider. You may need to reschedule your appointment if you arrive late (15 or more minutes).  Arriving late affects you and other patients whose appointments are after yours.  Also, if you miss three or more appointments without notifying the office, you may be dismissed from the clinic at the provider's discretion.      For prescription refill requests, have your pharmacy contact our office and allow 72 hours for refills to be completed.    Today you received the following chemotherapy and/or immunotherapy agents Keytruda. Pembrolizumab Injection What is this medication? PEMBROLIZUMAB (PEM broe LIZ ue mab) treats some types of cancer. It works by helping your immune system slow or stop the spread of cancer cells. It is a monoclonal antibody. This medicine may be used for other purposes; ask your health care provider or pharmacist if you have questions. COMMON BRAND NAME(S): Keytruda What should I tell my care team before I take this medication? They need to know if you have any of these conditions: Allogeneic stem cell transplant (uses someone else's stem cells) Autoimmune diseases, such as Crohn disease, ulcerative colitis, lupus History of chest radiation Nervous system problems, such as Guillain-Barre syndrome, myasthenia gravis Organ transplant An unusual or allergic reaction to pembrolizumab, other medications,  foods, dyes, or preservatives Pregnant or trying to get pregnant Breast-feeding How should I use this medication? This medication is injected into a vein. It is given by your care team in a hospital or clinic setting. A special MedGuide will be given to you before each treatment. Be sure to read this information carefully each time. Talk to your care team about the use of this medication in children. While it may be prescribed for children as young as 6 months for selected conditions, precautions do apply. Overdosage: If you think you have taken too much of this medicine contact a poison control center or emergency room at once. NOTE: This medicine is only for you. Do not share this medicine with others. What if I miss a dose? Keep appointments for follow-up doses. It is important not to miss your dose. Call your care team if you are unable to keep an appointment. What may interact with this medication? Interactions have not been studied. This list may not describe all possible interactions. Give your health care provider a list of all the medicines, herbs, non-prescription drugs, or dietary supplements you use. Also tell them if you smoke, drink alcohol, or use illegal drugs. Some items may interact with your medicine. What should I watch for while using this medication? Your condition will be monitored carefully while you are receiving this medication. You may need blood work while taking this medication. This medication may cause serious skin reactions. They can happen weeks to months after starting the medication. Contact your care team right away if you notice fevers or flu-like symptoms with a rash. The rash   may be red or purple and then turn into blisters or peeling of the skin. You may also notice a red rash with swelling of the face, lips, or lymph nodes in your neck or under your arms. Tell your care team right away if you have any change in your eyesight. Talk to your care team if you  may be pregnant. Serious birth defects can occur if you take this medication during pregnancy and for 4 months after the last dose. You will need a negative pregnancy test before starting this medication. Contraception is recommended while taking this medication and for 4 months after the last dose. Your care team can help you find the option that works for you. Do not breastfeed while taking this medication and for 4 months after the last dose. What side effects may I notice from receiving this medication? Side effects that you should report to your care team as soon as possible: Allergic reactions--skin rash, itching, hives, swelling of the face, lips, tongue, or throat Dry cough, shortness of breath or trouble breathing Eye pain, redness, irritation, or discharge with blurry or decreased vision Heart muscle inflammation--unusual weakness or fatigue, shortness of breath, chest pain, fast or irregular heartbeat, dizziness, swelling of the ankles, feet, or hands Hormone gland problems--headache, sensitivity to light, unusual weakness or fatigue, dizziness, fast or irregular heartbeat, increased sensitivity to cold or heat, excessive sweating, constipation, hair loss, increased thirst or amount of urine, tremors or shaking, irritability Infusion reactions--chest pain, shortness of breath or trouble breathing, feeling faint or lightheaded Kidney injury (glomerulonephritis)--decrease in the amount of urine, red or dark brown urine, foamy or bubbly urine, swelling of the ankles, hands, or feet Liver injury--right upper belly pain, loss of appetite, nausea, light-colored stool, dark yellow or brown urine, yellowing skin or eyes, unusual weakness or fatigue Pain, tingling, or numbness in the hands or feet, muscle weakness, change in vision, confusion or trouble speaking, loss of balance or coordination, trouble walking, seizures Rash, fever, and swollen lymph nodes Redness, blistering, peeling, or loosening  of the skin, including inside the mouth Sudden or severe stomach pain, bloody diarrhea, fever, nausea, vomiting Side effects that usually do not require medical attention (report to your care team if they continue or are bothersome): Bone, joint, or muscle pain Diarrhea Fatigue Loss of appetite Nausea Skin rash This list may not describe all possible side effects. Call your doctor for medical advice about side effects. You may report side effects to FDA at 1-800-FDA-1088. Where should I keep my medication? This medication is given in a hospital or clinic. It will not be stored at home. NOTE: This sheet is a summary. It may not cover all possible information. If you have questions about this medicine, talk to your doctor, pharmacist, or health care provider.  2024 Elsevier/Gold Standard (2022-01-07 00:00:00)       To help prevent nausea and vomiting after your treatment, we encourage you to take your nausea medication as directed.  BELOW ARE SYMPTOMS THAT SHOULD BE REPORTED IMMEDIATELY: *FEVER GREATER THAN 100.4 F (38 C) OR HIGHER *CHILLS OR SWEATING *NAUSEA AND VOMITING THAT IS NOT CONTROLLED WITH YOUR NAUSEA MEDICATION *UNUSUAL SHORTNESS OF BREATH *UNUSUAL BRUISING OR BLEEDING *URINARY PROBLEMS (pain or burning when urinating, or frequent urination) *BOWEL PROBLEMS (unusual diarrhea, constipation, pain near the anus) TENDERNESS IN MOUTH AND THROAT WITH OR WITHOUT PRESENCE OF ULCERS (sore throat, sores in mouth, or a toothache) UNUSUAL RASH, SWELLING OR PAIN  UNUSUAL VAGINAL DISCHARGE OR ITCHING     Items with * indicate a potential emergency and should be followed up as soon as possible or go to the Emergency Department if any problems should occur.  Please show the CHEMOTHERAPY ALERT CARD or IMMUNOTHERAPY ALERT CARD at check-in to the Emergency Department and triage nurse.  Should you have questions after your visit or need to cancel or reschedule your appointment, please contact  [facility] [place] [facility] [phone]  and follow the prompts.  Office hours are 8:00 a.m. to 4:30 p.m. Monday - Friday. Please note that voicemails left after 4:00 p.m. may not be returned until the following business day.  We are closed weekends and major holidays. You have access to a nurse at all times for urgent questions. Please call the main number to the clinic [phone] and follow the prompts.  For any non-urgent questions, you may also contact your provider using MyChart. We now offer e-Visits for anyone 18 and older to request care online for non-urgent symptoms. For details visit [email or web address].   Also download the MyChart app! Go to the app store, search "MyChart", open the app, select [place], and log in with your MyChart username and password.   

## 2023-03-04 NOTE — Patient Instructions (Signed)
[facility] Discharge Instructions   You were seen and examined today by Dr. Katragadda.  He reviewed the results of your lab work which are normal/stable.   We will proceed with your treatment today.  Return as scheduled.    Thank you for choosing  [facility] Center at [facility] to provide your oncology and hematology care.  To afford each patient quality time with our provider, please arrive at least 15 minutes before your scheduled appointment time.   If you have a lab appointment with the Cancer Center please come in thru the Main Entrance and check in at the main information desk.  You need to re-schedule your appointment should you arrive 10 or more minutes late.  We strive to give you quality time with our providers, and arriving late affects you and other patients whose appointments are after yours.  Also, if you no show three or more times for appointments you may be dismissed from the clinic at the providers discretion.     Again, thank you for choosing [facility].  Our hope is that these requests will decrease the amount of time that you wait before being seen by our physicians.       _____________________________________________________________  Should you have questions after your visit to [facility], please contact our office at [phone] and follow the prompts.  Our office hours are 8:00 a.m. and 4:30 p.m. Monday - Friday.  Please note that voicemails left after 4:00 p.m. may not be returned until the following business day.  We are closed weekends and major holidays.  You do have access to a nurse 24-7, just call the main number to the clinic [phone] and do not press any options, hold on the line and a nurse will answer the phone.    For prescription refill requests, have your pharmacy contact our office and allow 72 hours.    Due to Covid, you will need to wear a mask upon entering  the hospital. If you do not have a mask, a mask will be given to you at the Main Entrance upon arrival. For doctor visits, patients may have 1 support person age 18 or older with them. For treatment visits, patients can not have anyone with them due to social distancing guidelines and our immunocompromised population.      

## 2023-03-05 ENCOUNTER — Other Ambulatory Visit: Payer: Self-pay

## 2023-03-26 ENCOUNTER — Inpatient Hospital Stay: Payer: Medicare Other | Admitting: Hematology

## 2023-03-26 ENCOUNTER — Inpatient Hospital Stay: Payer: Medicare Other

## 2023-03-26 ENCOUNTER — Inpatient Hospital Stay: Payer: Medicare Other | Attending: Physician Assistant

## 2023-03-26 VITALS — BP 124/71 | HR 85 | Temp 97.6°F | Resp 18

## 2023-03-26 DIAGNOSIS — Z5112 Encounter for antineoplastic immunotherapy: Secondary | ICD-10-CM | POA: Diagnosis not present

## 2023-03-26 DIAGNOSIS — C3412 Malignant neoplasm of upper lobe, left bronchus or lung: Secondary | ICD-10-CM | POA: Diagnosis not present

## 2023-03-26 DIAGNOSIS — C7951 Secondary malignant neoplasm of bone: Secondary | ICD-10-CM | POA: Diagnosis not present

## 2023-03-26 DIAGNOSIS — Z95828 Presence of other vascular implants and grafts: Secondary | ICD-10-CM

## 2023-03-26 DIAGNOSIS — Z79620 Long term (current) use of immunosuppressive biologic: Secondary | ICD-10-CM | POA: Diagnosis not present

## 2023-03-26 LAB — COMPREHENSIVE METABOLIC PANEL
ALT: 32 U/L (ref 0–44)
AST: 22 U/L (ref 15–41)
Albumin: 3.7 g/dL (ref 3.5–5.0)
Alkaline Phosphatase: 68 U/L (ref 38–126)
Anion gap: 8 (ref 5–15)
BUN: 16 mg/dL (ref 8–23)
CO2: 23 mmol/L (ref 22–32)
Calcium: 8.6 mg/dL — ABNORMAL LOW (ref 8.9–10.3)
Chloride: 103 mmol/L (ref 98–111)
Creatinine, Ser: 0.94 mg/dL (ref 0.61–1.24)
GFR, Estimated: >60 mL/min (ref >60)
Glucose, Bld: 116 mg/dL — ABNORMAL HIGH (ref 70–99)
Potassium: 3.4 mmol/L — ABNORMAL LOW (ref 3.5–5.1)
Sodium: 134 mmol/L — ABNORMAL LOW (ref 135–145)
Total Bilirubin: 0.7 mg/dL (ref 0.3–1.2)
Total Protein: 6.8 g/dL (ref 6.5–8.1)

## 2023-03-26 LAB — CBC WITH DIFFERENTIAL/PLATELET
Abs Immature Granulocytes: 0.14 10*3/uL — ABNORMAL HIGH (ref 0.00–0.07)
Basophils Absolute: 0.1 10*3/uL (ref 0.0–0.1)
Basophils Relative: 1 %
Eosinophils Absolute: 0.2 10*3/uL (ref 0.0–0.5)
Eosinophils Relative: 1 %
HCT: 39.6 % (ref 39.0–52.0)
Hemoglobin: 13.1 g/dL (ref 13.0–17.0)
Immature Granulocytes: 1 %
Lymphocytes Relative: 7 %
Lymphs Abs: 0.9 10*3/uL (ref 0.7–4.0)
MCH: 33.2 pg (ref 26.0–34.0)
MCHC: 33.1 g/dL (ref 30.0–36.0)
MCV: 100.3 fL — ABNORMAL HIGH (ref 80.0–100.0)
Monocytes Absolute: 0.7 10*3/uL (ref 0.1–1.0)
Monocytes Relative: 6 %
Neutro Abs: 11.1 10*3/uL — ABNORMAL HIGH (ref 1.7–7.7)
Neutrophils Relative %: 84 %
Platelets: 212 10*3/uL (ref 150–400)
RBC: 3.95 MIL/uL — ABNORMAL LOW (ref 4.22–5.81)
RDW: 12.7 % (ref 11.5–15.5)
WBC: 13.1 10*3/uL — ABNORMAL HIGH (ref 4.0–10.5)
nRBC: 0 % (ref 0.0–0.2)

## 2023-03-26 LAB — MAGNESIUM: Magnesium: 1.8 mg/dL (ref 1.7–2.4)

## 2023-03-26 MED ORDER — POTASSIUM CHLORIDE CRYS ER 20 MEQ PO TBCR
40.0000 meq | EXTENDED_RELEASE_TABLET | Freq: Once | ORAL | Status: AC
  Administered 2023-03-26: 40 meq via ORAL
  Filled 2023-03-26: qty 2

## 2023-03-26 MED ORDER — SODIUM CHLORIDE 0.9% FLUSH
10.0000 mL | INTRAVENOUS | Status: DC | PRN
  Administered 2023-03-26: 10 mL via INTRAVENOUS

## 2023-03-26 MED ORDER — SODIUM CHLORIDE 0.9 % IV SOLN
200.0000 mg | Freq: Once | INTRAVENOUS | Status: AC
  Administered 2023-03-26: 200 mg via INTRAVENOUS
  Filled 2023-03-26: qty 8

## 2023-03-26 MED ORDER — SODIUM CHLORIDE 0.9 % IV SOLN: Freq: Once | INTRAVENOUS | Status: AC

## 2023-03-26 MED ORDER — SODIUM CHLORIDE 0.9% FLUSH
10.0000 mL | INTRAVENOUS | Status: DC | PRN
  Administered 2023-03-26: 10 mL

## 2023-03-26 MED ORDER — HEPARIN SOD (PORK) LOCK FLUSH 100 UNIT/ML IV SOLN
500.0000 [IU] | Freq: Once | INTRAVENOUS | Status: AC | PRN
  Administered 2023-03-26: 500 [IU]

## 2023-03-26 NOTE — Progress Notes (Signed)
Patients port flushed without difficulty.  Good blood return noted with no bruising or swelling noted at site.  Patient remains accessed for treatment.  

## 2023-03-26 NOTE — Progress Notes (Signed)
Patient tolerated therapy with no complaints voiced.  Side effects with management reviewed with understanding verbalized.  Port site clean and dry with no bruising or swelling noted at site.  Good blood return noted before and after administration of therapy.  Band aid applied.  Patient left in satisfactory condition with VSS and no s/s of distress noted.  

## 2023-03-26 NOTE — Patient Instructions (Signed)
[facility] [place]  Discharge Instructions: Thank you for choosing [facility] to provide your oncology and hematology care.  If you have a lab appointment with the Cancer Center - please note that after April 8th, 2024, all labs will be drawn in the cancer center.  You do not have to check in or register with the main entrance as you have in the past but will complete your check-in in the cancer center.  Wear comfortable clothing and clothing appropriate for easy access to any Portacath or PICC line.   We strive to give you quality time with your provider. You may need to reschedule your appointment if you arrive late (15 or more minutes).  Arriving late affects you and other patients whose appointments are after yours.  Also, if you miss three or more appointments without notifying the office, you may be dismissed from the clinic at the provider's discretion.      For prescription refill requests, have your pharmacy contact our office and allow 72 hours for refills to be completed.    Today you received the following chemotherapy and/or immunotherapy agents Keytruda      To help prevent nausea and vomiting after your treatment, we encourage you to take your nausea medication as directed.  BELOW ARE SYMPTOMS THAT SHOULD BE REPORTED IMMEDIATELY: *FEVER GREATER THAN 100.4 F (38 C) OR HIGHER *CHILLS OR SWEATING *NAUSEA AND VOMITING THAT IS NOT CONTROLLED WITH YOUR NAUSEA MEDICATION *UNUSUAL SHORTNESS OF BREATH *UNUSUAL BRUISING OR BLEEDING *URINARY PROBLEMS (pain or burning when urinating, or frequent urination) *BOWEL PROBLEMS (unusual diarrhea, constipation, pain near the anus) TENDERNESS IN MOUTH AND THROAT WITH OR WITHOUT PRESENCE OF ULCERS (sore throat, sores in mouth, or a toothache) UNUSUAL RASH, SWELLING OR PAIN  UNUSUAL VAGINAL DISCHARGE OR ITCHING   Items with * indicate a potential emergency and should be followed up as soon as possible or go to the  Emergency Department if any problems should occur.  Please show the CHEMOTHERAPY ALERT CARD or IMMUNOTHERAPY ALERT CARD at check-in to the Emergency Department and triage nurse.  Should you have questions after your visit or need to cancel or reschedule your appointment, please contact [facility] [place] [place] [phone]  and follow the prompts.  Office hours are 8:00 a.m. to 4:30 p.m. Monday - Friday. Please note that voicemails left after 4:00 p.m. may not be returned until the following business day.  We are closed weekends and major holidays. You have access to a nurse at all times for urgent questions. Please call the main number to the clinic [phone] and follow the prompts.  For any non-urgent questions, you may also contact your provider using MyChart. We now offer e-Visits for anyone 18 and older to request care online for non-urgent symptoms. For details visit [email or web address].   Also download the MyChart app! Go to the app store, search "MyChart", open the app, select [place], and log in with your MyChart username and password.   

## 2023-03-26 NOTE — Progress Notes (Signed)
Patient presents today for Keytruda infusion per providers order.  Vital signs and labs within parameters for treatment.  Patient has no new complaints at this time.  Treatment given today per MD orders.  Stable during infusion without adverse affects.  Vital signs stable.  No complaints at this time.  Discharge from clinic ambulatory in stable condition.  Alert and oriented X 3.  Follow up with [facility] as scheduled.  

## 2023-03-30 ENCOUNTER — Other Ambulatory Visit: Payer: Self-pay

## 2023-04-07 ENCOUNTER — Other Ambulatory Visit: Payer: Self-pay

## 2023-04-07 ENCOUNTER — Other Ambulatory Visit (HOSPITAL_COMMUNITY): Payer: Self-pay | Admitting: Internal Medicine

## 2023-04-07 ENCOUNTER — Ambulatory Visit (HOSPITAL_COMMUNITY)
Admission: RE | Admit: 2023-04-07 | Discharge: 2023-04-07 | Disposition: A | Discharge status: Home / Self Care | Payer: Medicare Other | Source: Ambulatory Visit | Attending: Internal Medicine | Admitting: Internal Medicine

## 2023-04-07 DIAGNOSIS — J449 Chronic obstructive pulmonary disease, unspecified: Secondary | ICD-10-CM | POA: Diagnosis not present

## 2023-04-07 DIAGNOSIS — Z6823 Body mass index (BMI) 23.0-23.9, adult: Secondary | ICD-10-CM | POA: Diagnosis not present

## 2023-04-07 DIAGNOSIS — M25511 Pain in right shoulder: Secondary | ICD-10-CM

## 2023-04-07 DIAGNOSIS — C3432 Malignant neoplasm of lower lobe, left bronchus or lung: Secondary | ICD-10-CM | POA: Diagnosis not present

## 2023-04-07 DIAGNOSIS — I10 Essential (primary) hypertension: Secondary | ICD-10-CM | POA: Diagnosis not present

## 2023-04-07 DIAGNOSIS — C3490 Malignant neoplasm of unspecified part of unspecified bronchus or lung: Secondary | ICD-10-CM | POA: Diagnosis not present

## 2023-04-07 DIAGNOSIS — M4316 Spondylolisthesis, lumbar region: Secondary | ICD-10-CM | POA: Diagnosis not present

## 2023-04-07 DIAGNOSIS — M48062 Spinal stenosis, lumbar region with neurogenic claudication: Secondary | ICD-10-CM | POA: Diagnosis not present

## 2023-04-07 DIAGNOSIS — M1991 Primary osteoarthritis, unspecified site: Secondary | ICD-10-CM | POA: Diagnosis not present

## 2023-04-14 NOTE — Progress Notes (Signed)
[facility] [address], [place] [address]    Clinic Day:  04/14/2023  Referring physician: Elfredia Nevins, MD  Patient Care Team: Elfredia Nevins, MD as PCP - General (Internal Medicine) Marykay Lex, MD as PCP - Cardiology (Cardiology) Malissa Hippo, MD (Inactive) as Consulting Physician (Gastroenterology) Lonie Peak, MD as Attending Physician (Radiation Oncology)   ASSESSMENT & PLAN:   Assessment: 1.  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 - Status post right upper lobectomy in February 2012 for right lung cancer. - LDCT chest (04/14/2022): Enlarging spiculated nodular soft tissue thickening in the posterior left upper lobe measuring 11.4 mm. - PET scan (04/24/2022): Left upper lobe lung nodule 14 mm with SUV 3.62.  Hypermetabolic focus in the inferior aspect of the right scapula without discrete CT correlate with SUV 5.71.  No hypermetabolic adenopathy. - LUL lesion FNA (06/03/2022): Adenocarcinoma - Right scapula biopsy (07/03/2022) adenocarcinoma consistent with lung primary - NGS: PD-L1 22 C3 TPS 100%, T p53 pathogenic variant, MSI-stable, TMB-low, no other targetable mutations - Cycle 1 of pembrolizumab started on 07/21/2022 - XRT to the right scapular lesion and left upper lobe lung lesion 5 fractions completed on 08/18/2022.   2.  Cystic lesion of left kidney - CTA PE on 04/14/2022 showed complex cystic lesion of the left kidney.  PET scan also showed exophytic 18 mm left renal lesion suspicious for malignancy. - MRI abdomen on 05/16/2022 showed benign appearing Bosniak category 1 and 2 left renal cyst with no evidence of neoplasm.  No further work-up needed.    Plan: 1.  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  Low-level uptake at the site of healing  sclerotic pathological fracture in the inferior right scapula. - At last visit, he was having diarrhea.  This has completely resolved even without steroids. - Diarrhea and likely from immunotherapy. - Reviewed labs today: Normal LFTs and creatinine and electrolytes.  CBC grossly normal.  Last TSH 1.2. - Proceed with Keytruda today and in 3 weeks.  RTC 6 weeks for follow-up.   2.  Left posterior chest wall rash: - This has improved.  Continue prednisone 5 mg daily.  Any worsening, call us.   3.  B12 deficiency - Continue B12 every other day and iron tablet daily.   4.  Hypokalemia: - Continue potassium 20 mEq daily.  Potassium is 3.4 today.    No orders of the defined types were placed in this encounter.     Alben Deeds Teague,acting as a Neurosurgeon for Doreatha Massed, MD.,have documented all relevant documentation on the behalf of Doreatha Massed, MD,as directed by  Doreatha Massed, MD while in the presence of Doreatha Massed, MD.  ***   Skyline-Ganipa R Teague   8/6/20249:45 PM  CHIEF COMPLAINT:   Diagnosis: bilateral lung cancers, normocytic anemia, and B12 deficiency    Cancer Staging  Adenocarcinoma of lung (HCC) Staging form: Lung, AJCC 7th Edition - Clinical: Stage IA (T1a, N0, M0) - Signed by [name], PA on 05/06/2011  Adenocarcinoma of upper lobe of left lung [facility]) Staging form: Lung, AJCC 8th Edition - Clinical stage from 07/16/2022: Stage IVA (cT1b, cN0, pM1b) - Unsigned    Prior Therapy: 1. Right upper lobectomy (February 2012) for right lung cancer 2. SBRT (November 2015) for stage I  left adenocarcinoma  Current Therapy:  Pembrolizumab    HISTORY OF PRESENT ILLNESS:   Oncology History  Adenocarcinoma of lung (HCC)  09/30/2010 Cancer Staging   CT of chest showed spiculated lesion   10/15/2010 Surgery   Right upper lobectomy by Dr. Edwyna Shell   10/16/2010 Remission     10/24/2013 Imaging   CT Chest-  Enlarging subpleural nodule in the left  lower lobe, now 9 mm, with distortion of the overlying pleura. Finding is concerning for a small bronchogenic carcinoma   01/23/2014 Imaging   CT Chest- Left lower lobe nodule has enlarged from 11/01/2012 and is worrisome for primary bronchogenic carcinoma.    07/04/2014 - 07/10/2014 Radiation Therapy   SBRT by Dr. Basilio Cairo in 3 fractions.   07/05/2015 Imaging   perifissural nodule in LLL appears slightly smaller with parenchymal opacity attrib to XRT, likely inflamm pathcy airspace dz more inferiorly in LLL, airspace dz in RLL resolved. no metastatic disease   Primary cancer of left lower lobe of lung (HCC)  06/18/2014 Initial Diagnosis   Left lower lobe nodule suspicious for primary lung carcinoma   07/04/2014 - 07/10/2014 Radiation Therapy   SBRT   10/12/2014 Imaging   No acute findings within the chest. Decrease in size of left lower lobe perifissural nodule compatible with response to therapy. No new or progressive disease identified within the chest.   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)  07/16/2022 Initial Diagnosis   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)   07/21/2022 -  Chemotherapy   Patient is on Treatment Plan : LUNG NSCLC Pembrolizumab (200) q21d        INTERVAL HISTORY:   Maurice Orozco is a 77 y.o. male presenting to clinic today for follow up of bilateral lung cancers, normocytic anemia, and B12 deficiency. He was last seen by me on 03/04/23.  Today, he states that he is doing well overall. His appetite level is at ***%. His energy level is at ***%.  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 dense RCA calcification --Non-ischemic Myoview   History of kidney stones    in the 70's   History of radiation therapy 07/04/14, 07/06/14, 07/10/14   LLL lung nodule/54 Gy/3 fx- SBRT   Hypercholesterolemia 05/06/2011   Pneumonia due to Streptococcus    HX. POST OPERATIVELY   Port-A-Cath in place 07/21/2022   Pre-diabetes    Stress fx pelvis    DUE TO AA IN 1970    Surgical History: Past Surgical History:  Procedure Laterality Date   BACK SURGERY  07/04/2017   fusion above previous surgery   BACK SURGERY  2011   metal plate Z-6,X0   BRONCHIAL BIOPSY  06/03/2022   Procedure: BRONCHIAL BIOPSIES;  Surgeon: Josephine Igo, DO;  Location: [facility];  Service: Pulmonary;;   BRONCHIAL BRUSHINGS  06/03/2022   Procedure: BRONCHIAL  BRUSHINGS;  Surgeon: Josephine Igo, DO;  Location: [facility];  Service: Pulmonary;;   BRONCHIAL NEEDLE ASPIRATION BIOPSY  06/03/2022   Procedure: BRONCHIAL NEEDLE ASPIRATION BIOPSIES;  Surgeon: Josephine Igo, DO;  Location: [facility];  Service: Pulmonary;;   Coroanry Calcium Score  03/2017   1043. Noted especially in RCA. Dense calcification, recommend Myoview over Cor CTA.   FIDUCIAL MARKER PLACEMENT  06/03/2022   Procedure: FIDUCIAL MARKER PLACEMENT;  Surgeon: Josephine Igo, DO;  Location: [facility];  Service: Pulmonary;;   IR IMAGING GUIDED PORT INSERTION  08/04/2022   LIPOMA EXCISION N/A 10/14/2017   Procedure: EXCISION LIPOMA ON BACK;  Surgeon: Lucretia Roers, MD;  Location: [facility];  Service: General;  Laterality: N/A;   LIPOMA EXCISION Left 11/19/2018   Procedure: MINOR EXCISION OF SEBACEOUS CYST NECK;  Surgeon: Lucretia Roers, MD;  Location: [facility];  Service: General;  Laterality: Left;  pt knows to arrive at 7:00   LUNG LOBECTOMY  2012   RT UPPER LOBE   NM MYOVIEW LTD  04/2017   EF  60%. Hypertensive response to exercise. (6:21 min, 7.7 METs) No EKG changes. LOW RISK    Social History: Social History   Socioeconomic History   Marital status: Married    Spouse name: Not on file   Number of children: 3   Years of education: Not on file   Highest education level: Not on file  Occupational History   Not on file  Tobacco Use   Smoking status: Former    Current packs/day: 0.00    Average packs/day: 1.5 packs/day for 59.0 years (88.5 ttl pk-yrs)    Types: Cigarettes    Start date: 09/09/1951    Quit date: 09/08/2010    Years since quitting: 12.6   Smokeless tobacco: Current    Types: Chew   Tobacco comments:    still chews tobacco, he tells me he hasn't in the past 2 days.   Vaping Use   Vaping status: Never Used  Substance and Sexual Activity   Alcohol use: No   Drug use: No   Sexual activity: Not Currently  Other Topics Concern   Not on file  Social History Narrative   Not on file   Social Determinants of Health   Financial Resource Strain: Not on file  Food Insecurity: Not on file  Transportation Needs: No Transportation Needs (07/13/2018)   PRAPARE - Administrator, Civil Service (Medical): No    Lack of Transportation (Non-Medical): No  Physical Activity: Not on file  Stress: Not on file  Social Connections: Not on file  Intimate Partner Violence: Not At Risk (07/13/2018)   Humiliation, Afraid, Rape, and Kick questionnaire    Fear of Current or Ex-Partner: No    Emotionally Abused: No    Physically Abused: No    Sexually Abused: No    Family History: Family History  Problem Relation Age of Onset   Hypertension Mother    Kidney disease Father    Heart attack Brother    Cancer Maternal Uncle        prostate cancer   Cancer Maternal Uncle        prostate cancer   Cancer Maternal Uncle        prostate cancer   Cancer Maternal Uncle        prostate cancer    Current Medications:  Current Outpatient Medications:     albuterol (PROVENTIL HFA;VENTOLIN HFA) 108 (90 Base) MCG/ACT  inhaler, Inhale 2 puffs into the lungs every 4 (four) hours as needed for wheezing or shortness of breath., Disp: 2 Inhaler, Rfl: 3   albuterol (PROVENTIL) (2.5 MG/3ML) 0.083% nebulizer solution, Take 2.5 mg by nebulization every 6 (six) hours as needed for wheezing or shortness of breath., Disp: , Rfl:    betamethasone dipropionate 0.05 % cream, Apply topically 2 (two) times daily., Disp: 30 g, Rfl: 1   cholecalciferol (VITAMIN D3) 25 MCG (1000 UNIT) tablet, Take 1,000 Units by mouth daily., Disp: , Rfl:    cimetidine (TAGAMET) 200 MG tablet, Take 400-600 mg by mouth daily as needed (for heartburn)., Disp: , Rfl:    clobetasol ointment (TEMOVATE) 0.05 %, Apply topically 2 (two) times daily as needed., Disp: , Rfl:    ferrous sulfate 325 (65 FE) MG tablet, Take 325 mg by mouth daily with breakfast., Disp: , Rfl:    fluticasone (FLONASE) 50 MCG/ACT nasal spray, Place 2 sprays into both nostrils daily as needed for rhinitis., Disp: , Rfl:    guaiFENesin (MUCINEX) 600 MG 12 hr tablet, Take 600 mg by mouth daily., Disp: , Rfl:    hydrochlorothiazide (HYDRODIURIL) 25 MG tablet, Take 12.5 mg by mouth daily., Disp: , Rfl:    HYDROcodone-acetaminophen (NORCO) 10-325 MG tablet, Take 1-2 tablets by mouth every 4 (four) hours as needed for moderate pain., Disp: 240 tablet, Rfl: 0   hydrOXYzine (ATARAX) 10 MG tablet, TAKE 1 TABLET BY MOUTH THREE TIMES A DAY AS NEEDED, Disp: 30 tablet, Rfl: 0   hydrOXYzine (VISTARIL) 25 MG capsule, Take 25-50 mg by mouth at bedtime as needed., Disp: , Rfl:    KLOR-CON M20 20 MEQ tablet, TAKE 1 TABLET BY MOUTH EVERY DAY, Disp: 90 tablet, Rfl: 1   lidocaine-prilocaine (EMLA) cream, APPLY A SMALL AMOUNT TO PORT A CATH SITE AND COVER WITH PLASTIC WRAP 1 HOUR PRIOR TO INFUSION APPOINTMENTS, Disp: 30 g, Rfl: 3   Menthol, Topical Analgesic, (ICY HOT EX), Apply 1 Application topically daily as needed (pain)., Disp: , Rfl:     naproxen sodium (ALEVE) 220 MG tablet, Take 220 mg by mouth daily as needed (pain)., Disp: , Rfl:    Omega-3 Fatty Acids (FISH OIL) 1000 MG CAPS, Take 1,000 mg by mouth daily., Disp: , Rfl:    OVER THE COUNTER MEDICATION, Take 1 capsule by mouth daily., Disp: , Rfl:    Oxycodone HCl 10 MG TABS, Take 10 mg by mouth 2 (two) times daily as needed (pain)., Disp: , Rfl:    pantoprazole (PROTONIX) 40 MG tablet, Take 40 mg by mouth See admin instructions. Take 40 mg daily, may take a second 40 mg dose as needed for heartburn, Disp: , Rfl:    PEMBROLIZUMAB IV, Inject into the vein every 21 ( twenty-one) days., Disp: , Rfl:    polyethylene glycol powder (GLYCOLAX/MIRALAX) 17 GM/SCOOP powder, Take 17 g by mouth daily as needed for mild constipation or moderate constipation., Disp: , Rfl:    predniSONE (DELTASONE) 5 MG tablet, Take 1 tablet (5 mg total) by mouth daily with breakfast., Disp: 30 tablet, Rfl: 4   prochlorperazine (COMPAZINE) 10 MG tablet, Take 1 tablet (10 mg total) by mouth every 6 (six) hours as needed for nausea or vomiting., Disp: 30 tablet, Rfl: 1   rosuvastatin (CRESTOR) 40 MG tablet, Take 40 mg by mouth at bedtime., Disp: , Rfl:    tamsulosin (FLOMAX) 0.4 MG CAPS capsule, TAKE 1 CAPSULE BY MOUTH EVERY DAY, Disp: 90 capsule, Rfl: 1  triamcinolone cream (KENALOG) 0.1 %, Apply 1 Application topically daily as needed (tick bite/itching)., Disp: , Rfl:    triazolam (HALCION) 0.25 MG tablet, Take 0.25 mg by mouth at bedtime., Disp: , Rfl:    vitamin B-12 (CYANOCOBALAMIN) 1000 MCG tablet, Take 1,000 mcg by mouth every other day., Disp: , Rfl:  No current facility-administered medications for this visit.  Facility-Administered Medications Ordered in Other Visits:    sodium chloride flush (NS) 0.9 % injection 10 mL, 10 mL, Intracatheter, PRN, Doreatha Massed, MD, 10 mL at 10/07/22 1523   sodium chloride flush (NS) 0.9 % injection 10 mL, 10 mL, Intracatheter, PRN, Doreatha Massed, MD, 10  mL at 10/28/22 1446   sodium chloride flush (NS) 0.9 % injection 10 mL, 10 mL, Intracatheter, PRN, Doreatha Massed, MD, 10 mL at 11/19/22 1547   Allergies: Allergies  Allergen Reactions   Fentanyl Other (See Comments)    CAUSED HALLUCINATIONS/05-03-13 pt reports it was oral fentanyl that caused hallucinations    Gabapentin Other (See Comments)    Bones throbbed, headaches, weakness   Nabumetone     Unknown reaction    Aspirin Nausea Only    REVIEW OF SYSTEMS:   Review of Systems  Constitutional:  Negative for chills, fatigue and fever.  HENT:   Negative for lump/mass, mouth sores, nosebleeds, sore throat and trouble swallowing.   Eyes:  Negative for eye problems.  Respiratory:  Negative for cough and shortness of breath.   Cardiovascular:  Negative for chest pain, leg swelling and palpitations.  Gastrointestinal:  Negative for abdominal pain, constipation, diarrhea, nausea and vomiting.  Genitourinary:  Negative for bladder incontinence, difficulty urinating, dysuria, frequency, hematuria and nocturia.   Musculoskeletal:  Negative for arthralgias, back pain, flank pain, myalgias and neck pain.  Skin:  Negative for itching and rash.  Neurological:  Negative for dizziness, headaches and numbness.  Hematological:  Does not bruise/bleed easily.  Psychiatric/Behavioral:  Negative for depression, sleep disturbance and suicidal ideas. The patient is not nervous/anxious.   All other systems reviewed and are negative.    VITALS:   There were no vitals taken for this visit.  Wt Readings from Last 3 Encounters:  03/26/23 146 lb 9.6 oz (66.5 kg)  03/04/23 141 lb 12.8 oz (64.3 kg)  02/11/23 137 lb 9.6 oz (62.4 kg)    There is no height or weight on file to calculate BMI.  Performance status (ECOG): 1 - Symptomatic but completely ambulatory  PHYSICAL EXAM:   Physical Exam Vitals and nursing note reviewed. Exam conducted with a chaperone present.  Constitutional:       Appearance: Normal appearance.  Cardiovascular:     Rate and Rhythm: Normal rate and regular rhythm.     Pulses: Normal pulses.     Heart sounds: Normal heart sounds.  Pulmonary:     Effort: Pulmonary effort is normal.     Breath sounds: Normal breath sounds.  Abdominal:     Palpations: Abdomen is soft. There is no hepatomegaly, splenomegaly or mass.     Tenderness: There is no abdominal tenderness.  Musculoskeletal:     Right lower leg: No edema.     Left lower leg: No edema.  Lymphadenopathy:     Cervical: No cervical adenopathy.     Right cervical: No superficial, deep or posterior cervical adenopathy.    Left cervical: No superficial, deep or posterior cervical adenopathy.     Upper Body:     Right upper body: No supraclavicular or axillary adenopathy.  Left upper body: No supraclavicular or axillary adenopathy.  Neurological:     General: No focal deficit present.     Mental Status: He is alert and oriented to person, place, and time.  Psychiatric:        Mood and Affect: Mood normal.        Behavior: Behavior normal.     LABS:      Latest Ref Rng & Units 03/26/2023   12:04 PM 03/04/2023   12:03 PM 02/11/2023   11:57 AM  CBC  WBC 4.0 - 10.5 K/uL 13.1  12.5  12.3   Hemoglobin 13.0 - 17.0 g/dL 56.4  33.2  95.1   Hematocrit 39.0 - 52.0 % 39.6  41.7  42.8   Platelets 150 - 400 K/uL 212  242  242       Latest Ref Rng & Units 03/26/2023   12:04 PM 03/04/2023   12:03 PM 02/11/2023   11:57 AM  CMP  Glucose 70 - 99 mg/dL 884  166  063   BUN 8 - 23 mg/dL 16  15  30    Creatinine 0.61 - 1.24 mg/dL 0.16  0.10  9.32   Sodium 135 - 145 mmol/L 134  135  134   Potassium 3.5 - 5.1 mmol/L 3.4  3.4  3.2   Chloride 98 - 111 mmol/L 103  101  101   CO2 22 - 32 mmol/L 23  24  21    Calcium 8.9 - 10.3 mg/dL 8.6  9.1  9.0   Total Protein 6.5 - 8.1 g/dL 6.8  7.2  7.4   Total Bilirubin 0.3 - 1.2 mg/dL 0.7  0.8  1.0   Alkaline Phos 38 - 126 U/L 68  76  79   AST 15 - 41 U/L 22  22  22     ALT 0 - 44 U/L 32  36  29      No results found for: "CEA1", "CEA" / No results found for: "CEA1", "CEA" No results found for: "PSA1" No results found for: "TFT732" No results found for: "CAN125"  No results found for: "TOTALPROTELP", "ALBUMINELP", "A1GS", "A2GS", "BETS", "BETA2SER", "GAMS", "MSPIKE", "SPEI" Lab Results  Component Value Date   TIBC 310 09/30/2022   TIBC 323 04/14/2022   TIBC 373 10/01/2021   FERRITIN 108 09/30/2022   FERRITIN 65 04/14/2022   FERRITIN 49 10/01/2021   IRONPCTSAT 13 (L) 09/30/2022   IRONPCTSAT 22 04/14/2022   IRONPCTSAT 27 10/01/2021   Lab Results  Component Value Date   LDH 120 06/28/2021   LDH 130 12/20/2020   LDH 97 (L) 07/12/2018     STUDIES:   DG Shoulder Right  Result Date: 04/12/2023 CLINICAL DATA:  Right shoulder pain. EXAM: RIGHT SHOULDER - 2+ VIEW COMPARISON:  No prior shoulder exams.  PET CT 01/15/2023 reviewed FINDINGS: Irregularity of the inferior scapula corresponds to site of healing pathologic fracture on prior PET. No evidence of underlying lesion by radiograph. The alignment is normal. The joint spaces are normal. No acute fracture. No radiographic findings of new focal lesion. No soft tissue calcifications. Right chest port remains in place. IMPRESSION: Irregularity of the inferior scapula corresponds to site of healing pathologic fracture on prior PET. No evidence of underlying lesion by radiograph. Electronically Signed   By: Narda Rutherford M.D.   On: 04/12/2023 15:36

## 2023-04-15 ENCOUNTER — Inpatient Hospital Stay: Payer: Medicare Other

## 2023-04-15 ENCOUNTER — Inpatient Hospital Stay: Payer: Medicare Other | Attending: Physician Assistant

## 2023-04-15 ENCOUNTER — Other Ambulatory Visit: Payer: Self-pay

## 2023-04-15 ENCOUNTER — Inpatient Hospital Stay (HOSPITAL_BASED_OUTPATIENT_CLINIC_OR_DEPARTMENT_OTHER): Payer: Medicare Other | Admitting: Hematology

## 2023-04-15 VITALS — BP 136/62 | HR 83 | Temp 97.4°F | Resp 18

## 2023-04-15 DIAGNOSIS — C3412 Malignant neoplasm of upper lobe, left bronchus or lung: Secondary | ICD-10-CM

## 2023-04-15 DIAGNOSIS — C7951 Secondary malignant neoplasm of bone: Secondary | ICD-10-CM | POA: Diagnosis not present

## 2023-04-15 DIAGNOSIS — Z79620 Long term (current) use of immunosuppressive biologic: Secondary | ICD-10-CM | POA: Insufficient documentation

## 2023-04-15 DIAGNOSIS — Z5112 Encounter for antineoplastic immunotherapy: Secondary | ICD-10-CM | POA: Insufficient documentation

## 2023-04-15 DIAGNOSIS — E876 Hypokalemia: Secondary | ICD-10-CM | POA: Diagnosis not present

## 2023-04-15 DIAGNOSIS — D649 Anemia, unspecified: Secondary | ICD-10-CM | POA: Insufficient documentation

## 2023-04-15 DIAGNOSIS — E538 Deficiency of other specified B group vitamins: Secondary | ICD-10-CM | POA: Insufficient documentation

## 2023-04-15 DIAGNOSIS — Z95828 Presence of other vascular implants and grafts: Secondary | ICD-10-CM

## 2023-04-15 LAB — CBC WITH DIFFERENTIAL/PLATELET
Abs Immature Granulocytes: 0.07 10*3/uL (ref 0.00–0.07)
Basophils Absolute: 0.1 10*3/uL (ref 0.0–0.1)
Basophils Relative: 1 %
Eosinophils Absolute: 0.3 10*3/uL (ref 0.0–0.5)
Eosinophils Relative: 3 %
HCT: 37 % — ABNORMAL LOW (ref 39.0–52.0)
Hemoglobin: 12.2 g/dL — ABNORMAL LOW (ref 13.0–17.0)
Immature Granulocytes: 1 %
Lymphocytes Relative: 8 %
Lymphs Abs: 0.7 10*3/uL (ref 0.7–4.0)
MCH: 33.4 pg (ref 26.0–34.0)
MCHC: 33 g/dL (ref 30.0–36.0)
MCV: 101.4 fL — ABNORMAL HIGH (ref 80.0–100.0)
Monocytes Absolute: 0.7 10*3/uL (ref 0.1–1.0)
Monocytes Relative: 8 %
Neutro Abs: 7.7 10*3/uL (ref 1.7–7.7)
Neutrophils Relative %: 79 %
Platelets: 207 10*3/uL (ref 150–400)
RBC: 3.65 MIL/uL — ABNORMAL LOW (ref 4.22–5.81)
RDW: 12.4 % (ref 11.5–15.5)
WBC: 9.5 10*3/uL (ref 4.0–10.5)
nRBC: 0 % (ref 0.0–0.2)

## 2023-04-15 LAB — COMPREHENSIVE METABOLIC PANEL
ALT: 29 U/L (ref 0–44)
AST: 23 U/L (ref 15–41)
Albumin: 3.8 g/dL (ref 3.5–5.0)
Alkaline Phosphatase: 62 U/L (ref 38–126)
Anion gap: 9 (ref 5–15)
BUN: 15 mg/dL (ref 8–23)
CO2: 26 mmol/L (ref 22–32)
Calcium: 8.7 mg/dL — ABNORMAL LOW (ref 8.9–10.3)
Chloride: 100 mmol/L (ref 98–111)
Creatinine, Ser: 0.91 mg/dL (ref 0.61–1.24)
GFR, Estimated: >60 mL/min (ref >60)
Glucose, Bld: 129 mg/dL — ABNORMAL HIGH (ref 70–99)
Potassium: 3.9 mmol/L (ref 3.5–5.1)
Sodium: 135 mmol/L (ref 135–145)
Total Bilirubin: 0.7 mg/dL (ref 0.3–1.2)
Total Protein: 6.6 g/dL (ref 6.5–8.1)

## 2023-04-15 LAB — TSH: TSH: 1.295 u[IU]/mL (ref 0.350–4.500)

## 2023-04-15 LAB — MAGNESIUM: Magnesium: 1.9 mg/dL (ref 1.7–2.4)

## 2023-04-15 MED ORDER — SODIUM CHLORIDE 0.9% FLUSH
10.0000 mL | Freq: Once | INTRAVENOUS | Status: AC
  Administered 2023-04-15: 10 mL via INTRAVENOUS

## 2023-04-15 MED ORDER — SODIUM CHLORIDE 0.9 % IV SOLN: Freq: Once | INTRAVENOUS | Status: DC

## 2023-04-15 MED ORDER — LIDOCAINE-PRILOCAINE 2.5-2.5 % EX CREA: TOPICAL_CREAM | CUTANEOUS | 3 refills | Status: DC

## 2023-04-15 MED ORDER — SODIUM CHLORIDE 0.9 % IV SOLN
200.0000 mg | Freq: Once | INTRAVENOUS | Status: AC
  Administered 2023-04-15: 200 mg via INTRAVENOUS
  Filled 2023-04-15: qty 8

## 2023-04-15 NOTE — Progress Notes (Signed)
Patient has been assessed, vital signs and labs have been reviewed by Dr. Katragadda. ANC, Creatinine, LFTs, and Platelets are within treatment parameters per Dr. Katragadda. The patient is good to proceed with treatment at this time. Primary RN and pharmacy aware.  

## 2023-04-15 NOTE — Patient Instructions (Addendum)
[facility]  Discharge Instructions  You were seen and examined today by Dr. Ellin Saba.  Dr. Ellin Saba discussed your most recent lab work which are stable.  Proceed with treatment today as planned.  Follow-up as scheduled.  Thank you for choosing [facility] to provide your oncology and hematology care.   To afford each patient quality time with our provider, please arrive at least 15 minutes before your scheduled appointment time. You may need to reschedule your appointment if you arrive late (10 or more minutes). Arriving late affects you and other patients whose appointments are after yours.  Also, if you miss three or more appointments without notifying the office, you may be dismissed from the clinic at the provider's discretion.    Again, thank you for choosing [facility].  Our hope is that these requests will decrease the amount of time that you wait before being seen by our physicians.   If you have a lab appointment with the Cancer Center - please note that after April 8th, all labs will be drawn in the cancer center.  You do not have to check in or register with the main entrance as you have in the past but will complete your check-in at the cancer center.            _____________________________________________________________  Should you have questions after your visit to [facility], please contact our office at [phone] and follow the prompts.  Our office hours are 8:00 a.m. to 4:30 p.m. Monday - Thursday and 8:00 a.m. to 2:30 p.m. Friday.  Please note that voicemails left after 4:00 p.m. may not be returned until the following business day.  We are closed weekends and all major holidays.  You do have access to a nurse 24-7, just call the main number to the clinic [phone] and do not press any options, hold on the line and a nurse will answer the phone.    For prescription refill  requests, have your pharmacy contact our office and allow 72 hours.    Masks are no longer required in the cancer centers. If you would like for your care team to wear a mask while they are taking care of you, please let them know. You may have one support person who is at least 76 years old accompany you for your appointments.

## 2023-04-17 ENCOUNTER — Other Ambulatory Visit: Payer: Self-pay

## 2023-04-25 ENCOUNTER — Other Ambulatory Visit: Payer: Self-pay | Admitting: Hematology

## 2023-04-28 ENCOUNTER — Other Ambulatory Visit: Payer: Self-pay | Admitting: Hematology

## 2023-05-03 ENCOUNTER — Ambulatory Visit
Admission: EM | Admit: 2023-05-03 | Discharge: 2023-05-03 | Disposition: A | Discharge status: Home / Self Care | Payer: Medicare Other | Attending: Family Medicine | Admitting: Family Medicine

## 2023-05-03 ENCOUNTER — Other Ambulatory Visit: Payer: Self-pay

## 2023-05-03 ENCOUNTER — Encounter: Payer: Self-pay | Admitting: Emergency Medicine

## 2023-05-03 DIAGNOSIS — R1011 Right upper quadrant pain: Secondary | ICD-10-CM | POA: Insufficient documentation

## 2023-05-03 DIAGNOSIS — N390 Urinary tract infection, site not specified: Secondary | ICD-10-CM | POA: Insufficient documentation

## 2023-05-03 DIAGNOSIS — R109 Unspecified abdominal pain: Secondary | ICD-10-CM | POA: Diagnosis not present

## 2023-05-03 LAB — POCT URINALYSIS DIP (MANUAL ENTRY)
Blood, UA: NEGATIVE
Glucose, UA: NEGATIVE mg/dL
Ketones, POC UA: NEGATIVE mg/dL
Nitrite, UA: POSITIVE — AB
Protein Ur, POC: NEGATIVE mg/dL
Spec Grav, UA: 1.02 (ref 1.010–1.025)
Urobilinogen, UA: 0.2 E.U./dL
pH, UA: 5.5 (ref 5.0–8.0)

## 2023-05-03 MED ORDER — SULFAMETHOXAZOLE-TRIMETHOPRIM 800-160 MG PO TABS: 1.0000 | ORAL_TABLET | Freq: Two times a day (BID) | ORAL | 0 refills | Status: AC

## 2023-05-03 MED ORDER — KETOROLAC TROMETHAMINE 30 MG/ML IJ SOLN
30.0000 mg | Freq: Once | INTRAMUSCULAR | Status: AC
  Administered 2023-05-03: 30 mg via INTRAMUSCULAR

## 2023-05-03 NOTE — ED Triage Notes (Addendum)
Pt reports upper back pain that radiates around to right side of abdomen, intermittent nausea, diarrhea for last several weeks. Pt reports history of gall stones but states "I've never felt this pain before."

## 2023-05-03 NOTE — Discharge Instructions (Signed)
We are treating for a urinary tract infection, which may be an incidental finding today.  I am not convinced that this is causing all of your upper abdominal pain.  A urine culture has been sent out for further evaluation of this and we have also sent out some labs that should be back tomorrow.  We have given you a shot of Toradol, a strong anti-inflammatory pain medication so do not take any ibuprofen or Aleve over the next 2 days only Tylenol.  Follow-up as soon as possible with your primary care provider and go to the emergency department for worsening symptoms.

## 2023-05-04 ENCOUNTER — Other Ambulatory Visit (HOSPITAL_COMMUNITY): Payer: Self-pay | Admitting: Internal Medicine

## 2023-05-04 ENCOUNTER — Ambulatory Visit (HOSPITAL_COMMUNITY)
Admission: RE | Admit: 2023-05-04 | Discharge: 2023-05-04 | Disposition: A | Discharge status: Home / Self Care | Payer: Medicare Other | Source: Ambulatory Visit | Attending: Internal Medicine | Admitting: Internal Medicine

## 2023-05-04 DIAGNOSIS — M546 Pain in thoracic spine: Secondary | ICD-10-CM | POA: Diagnosis not present

## 2023-05-04 DIAGNOSIS — I10 Essential (primary) hypertension: Secondary | ICD-10-CM | POA: Diagnosis not present

## 2023-05-04 DIAGNOSIS — R079 Chest pain, unspecified: Secondary | ICD-10-CM | POA: Diagnosis not present

## 2023-05-04 DIAGNOSIS — C3432 Malignant neoplasm of lower lobe, left bronchus or lung: Secondary | ICD-10-CM | POA: Diagnosis not present

## 2023-05-04 DIAGNOSIS — R1011 Right upper quadrant pain: Secondary | ICD-10-CM

## 2023-05-04 DIAGNOSIS — M48062 Spinal stenosis, lumbar region with neurogenic claudication: Secondary | ICD-10-CM | POA: Diagnosis not present

## 2023-05-04 DIAGNOSIS — Z6822 Body mass index (BMI) 22.0-22.9, adult: Secondary | ICD-10-CM | POA: Diagnosis not present

## 2023-05-05 ENCOUNTER — Emergency Department (HOSPITAL_COMMUNITY)
Admission: EM | Admit: 2023-05-05 | Discharge: 2023-05-05 | Disposition: A | Discharge status: Home / Self Care | Payer: Medicare Other | Attending: Emergency Medicine | Admitting: Emergency Medicine

## 2023-05-05 ENCOUNTER — Emergency Department (HOSPITAL_COMMUNITY): Payer: Medicare Other

## 2023-05-05 ENCOUNTER — Encounter (HOSPITAL_COMMUNITY): Payer: Self-pay | Admitting: Emergency Medicine

## 2023-05-05 DIAGNOSIS — Z85118 Personal history of other malignant neoplasm of bronchus and lung: Secondary | ICD-10-CM | POA: Diagnosis not present

## 2023-05-05 DIAGNOSIS — R1010 Upper abdominal pain, unspecified: Secondary | ICD-10-CM | POA: Diagnosis not present

## 2023-05-05 DIAGNOSIS — R109 Unspecified abdominal pain: Secondary | ICD-10-CM

## 2023-05-05 DIAGNOSIS — J449 Chronic obstructive pulmonary disease, unspecified: Secondary | ICD-10-CM | POA: Diagnosis not present

## 2023-05-05 DIAGNOSIS — N3289 Other specified disorders of bladder: Secondary | ICD-10-CM | POA: Diagnosis not present

## 2023-05-05 LAB — COMPREHENSIVE METABOLIC PANEL
ALT: 14 IU/L (ref 0–44)
ALT: 84 U/L — ABNORMAL HIGH (ref 0–44)
AST: 14 IU/L (ref 0–40)
AST: 85 U/L — ABNORMAL HIGH (ref 15–41)
Albumin: 4.3 g/dL (ref 3.8–4.8)
Albumin: 3.9 g/dL (ref 3.5–5.0)
Alkaline Phosphatase: 84 IU/L (ref 44–121)
Alkaline Phosphatase: 94 U/L (ref 38–126)
Anion gap: 11 (ref 5–15)
BUN/Creatinine Ratio: 17 (ref 10–24)
BUN: 18 mg/dL (ref 8–27)
BUN: 17 mg/dL (ref 8–23)
Bilirubin Total: 0.2 mg/dL (ref 0.0–1.2)
CO2: 20 mmol/L (ref 20–29)
CO2: 21 mmol/L — ABNORMAL LOW (ref 22–32)
Calcium: 9.2 mg/dL (ref 8.6–10.2)
Calcium: 8.7 mg/dL — ABNORMAL LOW (ref 8.9–10.3)
Chloride: 101 mmol/L (ref 96–106)
Chloride: 99 mmol/L (ref 98–111)
Creatinine, Ser: 1.08 mg/dL (ref 0.76–1.27)
Creatinine, Ser: 0.98 mg/dL (ref 0.61–1.24)
GFR, Estimated: >60 mL/min (ref >60)
Globulin, Total: 2.2 g/dL (ref 1.5–4.5)
Glucose, Bld: 137 mg/dL — ABNORMAL HIGH (ref 70–99)
Glucose: 113 mg/dL — ABNORMAL HIGH (ref 70–99)
Potassium: 3.8 mmol/L (ref 3.5–5.2)
Potassium: 3.8 mmol/L (ref 3.5–5.1)
Sodium: 136 mmol/L (ref 134–144)
Sodium: 131 mmol/L — ABNORMAL LOW (ref 135–145)
Total Bilirubin: 0.4 mg/dL (ref 0.3–1.2)
Total Protein: 6.5 g/dL (ref 6.0–8.5)
Total Protein: 6.9 g/dL (ref 6.5–8.1)
eGFR: 71 mL/min/{1.73_m2} (ref >59)

## 2023-05-05 LAB — CBC WITH DIFFERENTIAL/PLATELET
Abs Immature Granulocytes: 0.08 10*3/uL — ABNORMAL HIGH (ref 0.00–0.07)
Basophils Absolute: 0.1 10*3/uL (ref 0.0–0.2)
Basophils Absolute: 0.1 10*3/uL (ref 0.0–0.1)
Basophils Relative: 1 %
Basos: 1 %
EOS (ABSOLUTE): 0.1 10*3/uL (ref 0.0–0.4)
Eos: 1 %
Eosinophils Absolute: 0.2 10*3/uL (ref 0.0–0.5)
Eosinophils Relative: 2 %
HCT: 35.9 % — ABNORMAL LOW (ref 39.0–52.0)
Hematocrit: 39.8 % (ref 37.5–51.0)
Hemoglobin: 13.5 g/dL (ref 13.0–17.7)
Hemoglobin: 11.8 g/dL — ABNORMAL LOW (ref 13.0–17.0)
Immature Grans (Abs): 0.1 10*3/uL (ref 0.0–0.1)
Immature Granulocytes: 1 %
Immature Granulocytes: 1 %
Lymphocytes Absolute: 0.5 10*3/uL — ABNORMAL LOW (ref 0.7–3.1)
Lymphocytes Relative: 6 %
Lymphs Abs: 0.5 10*3/uL — ABNORMAL LOW (ref 0.7–4.0)
Lymphs: 4 %
MCH: 33.3 pg — ABNORMAL HIGH (ref 26.6–33.0)
MCH: 33.3 pg (ref 26.0–34.0)
MCHC: 33.9 g/dL (ref 31.5–35.7)
MCHC: 32.9 g/dL (ref 30.0–36.0)
MCV: 98 fL — ABNORMAL HIGH (ref 79–97)
MCV: 101.4 fL — ABNORMAL HIGH (ref 80.0–100.0)
Monocytes Absolute: 0.6 10*3/uL (ref 0.1–0.9)
Monocytes Absolute: 0.5 10*3/uL (ref 0.1–1.0)
Monocytes Relative: 6 %
Monocytes: 5 %
Neutro Abs: 7.2 10*3/uL (ref 1.7–7.7)
Neutrophils Absolute: 11.2 10*3/uL — ABNORMAL HIGH (ref 1.4–7.0)
Neutrophils Relative %: 84 %
Neutrophils: 88 %
Platelets: 266 10*3/uL (ref 150–450)
Platelets: 231 10*3/uL (ref 150–400)
RBC: 4.05 x10E6/uL — ABNORMAL LOW (ref 4.14–5.80)
RBC: 3.54 MIL/uL — ABNORMAL LOW (ref 4.22–5.81)
RDW: 11.6 % (ref 11.6–15.4)
RDW: 12.4 % (ref 11.5–15.5)
WBC: 12.4 10*3/uL — ABNORMAL HIGH (ref 3.4–10.8)
WBC: 8.5 10*3/uL (ref 4.0–10.5)
nRBC: 0 % (ref 0.0–0.2)

## 2023-05-05 LAB — URINALYSIS, ROUTINE W REFLEX MICROSCOPIC
Bilirubin Urine: NEGATIVE
Glucose, UA: NEGATIVE mg/dL
Ketones, ur: NEGATIVE mg/dL
Leukocytes,Ua: NEGATIVE
Nitrite: NEGATIVE
Protein, ur: NEGATIVE mg/dL
Specific Gravity, Urine: 1.013 (ref 1.005–1.030)
pH: 5 (ref 5.0–8.0)

## 2023-05-05 LAB — LIPASE: Lipase: 34 U/L (ref 13–78)

## 2023-05-05 LAB — URINE CULTURE: Culture: >=100000 — AB

## 2023-05-05 LAB — D-DIMER, QUANTITATIVE: D-Dimer, Quant: 0.5 ug{FEU}/mL (ref 0.00–0.50)

## 2023-05-05 LAB — LIPASE, BLOOD: Lipase: 29 U/L (ref 11–51)

## 2023-05-05 MED ORDER — HEPARIN SOD (PORK) LOCK FLUSH 100 UNIT/ML IV SOLN
500.0000 [IU] | Freq: Once | INTRAVENOUS | Status: AC
  Administered 2023-05-05: 500 [IU]
  Filled 2023-05-05: qty 5

## 2023-05-05 NOTE — ED Triage Notes (Signed)
Pt complains of right upper side pain x 2 weeks. Was seen at urgent care 2 days ago blood work taken and advised to come to ER or PCP. Pt went to PCP yesterday and had xrays done. Pt was told he had a UTI and prescribed antibiotic. Pain comes and goes and last night pain was intense. Pt took Oxycodone 15mg  today 2pm

## 2023-05-05 NOTE — Discharge Instructions (Signed)
As discussed, your evaluation today has been largely reassuring.  But, it is important that you monitor your condition carefully, and do not hesitate to return to the ED if you develop new, or concerning changes in your condition. ? ?Otherwise, please follow-up with your physician for appropriate ongoing care. ? ?

## 2023-05-05 NOTE — ED Provider Notes (Signed)
[place] [facility] AT [facility] Provider Note   CSN: [id number] Arrival date & time: 05/05/23  1417     History  Chief Complaint  Patient presents with   Back Pain    RUFFUS HOPP is a 77 y.o. male.  HPI Patient presents with right flank pain.  History is notable for malignancy he is currently on Keytruda.  This pain began about 2 weeks ago, without associate dysuria, hematuria, vomiting, fever, but with no relief in spite of using home pain medication. He is here with his wife who assist with history. He went to urgent care and the primary care, was encouraged for additional evaluation. He has baseline dyspnea, this is more or less unchanged.    Home Medications Prior to Admission medications   Medication Sig Start Date End Date Taking? Authorizing Provider  albuterol (PROVENTIL HFA;VENTOLIN HFA) 108 (90 Base) MCG/ACT inhaler Inhale 2 puffs into the lungs every 4 (four) hours as needed for wheezing or shortness of breath. 04/23/18   Glenford Bayley, NP  albuterol (PROVENTIL) (2.5 MG/3ML) 0.083% nebulizer solution Take 2.5 mg by nebulization every 6 (six) hours as needed for wheezing or shortness of breath.    [provider]  betamethasone dipropionate 0.05 % cream Apply topically 2 (two) times daily. 09/30/22   Doreatha Massed, MD  cholecalciferol (VITAMIN D3) 25 MCG (1000 UNIT) tablet Take 1,000 Units by mouth daily.    [provider]  cimetidine (TAGAMET) 200 MG tablet Take 400-600 mg by mouth daily as needed (for heartburn).    [provider]  clobetasol ointment (TEMOVATE) 0.05 % Apply topically 2 (two) times daily as needed. 10/30/22   [provider]  ferrous sulfate 325 (65 FE) MG tablet Take 325 mg by mouth daily with breakfast.    [provider]  fluticasone (FLONASE) 50 MCG/ACT nasal spray Place 2 sprays into both nostrils daily as needed for rhinitis.    [provider]  guaiFENesin  (MUCINEX) 600 MG 12 hr tablet Take 600 mg by mouth daily. 04/28/20   [provider]  hydrochlorothiazide (HYDRODIURIL) 25 MG tablet Take 12.5 mg by mouth daily. 08/14/20   [provider]  HYDROcodone-acetaminophen (NORCO) 10-325 MG tablet Take 1-2 tablets by mouth every 4 (four) hours as needed for moderate pain. 07/28/22   Doreatha Massed, MD  hydrOXYzine (ATARAX) 10 MG tablet TAKE 1 TABLET BY MOUTH THREE TIMES A DAY AS NEEDED 02/09/23   Doreatha Massed, MD  hydrOXYzine (VISTARIL) 25 MG capsule Take 25-50 mg by mouth at bedtime as needed. 10/30/22   [provider]  KLOR-CON M20 20 MEQ tablet TAKE 1 TABLET BY MOUTH EVERY DAY 04/27/23   Doreatha Massed, MD  lidocaine-prilocaine (EMLA) cream Apply a small amount to port a cath site and cover with plastic wrap 1 hour prior to infusion appointments 04/15/23   Doreatha Massed, MD  Menthol, Topical Analgesic, (ICY HOT EX) Apply 1 Application topically daily as needed (pain).    [provider]  naproxen sodium (ALEVE) 220 MG tablet Take 220 mg by mouth daily as needed (pain).    [provider]  Omega-3 Fatty Acids (FISH OIL) 1000 MG CAPS Take 1,000 mg by mouth daily.    [provider]  OVER THE COUNTER MEDICATION Take 1 capsule by mouth daily.    [provider]  Oxycodone HCl 10 MG TABS Take 10 mg by mouth 2 (two) times daily as needed (pain). 09/16/21   [provider]  pantoprazole (PROTONIX) 40 MG tablet Take 40 mg by mouth See admin instructions. Take 40 mg daily, may take a second 40 mg dose as needed for heartburn 03/01/20   [provider]  PEMBROLIZUMAB IV Inject into the vein every 21 ( twenty-one) days. 07/21/22   [provider]  polyethylene glycol powder (GLYCOLAX/MIRALAX) 17 GM/SCOOP powder Take 17 g by mouth daily as needed for mild constipation or moderate constipation.    [provider]  predniSONE (DELTASONE) 5 MG tablet TAKE  1 TABLET BY MOUTH EVERY DAY WITH BREAKFAST 04/29/23   Doreatha Massed, MD  prochlorperazine (COMPAZINE) 10 MG tablet Take 1 tablet (10 mg total) by mouth every 6 (six) hours as needed for nausea or vomiting. Patient not taking: Reported on 04/15/2023 07/21/22   Doreatha Massed, MD  rosuvastatin (CRESTOR) 40 MG tablet Take 40 mg by mouth at bedtime.    [provider]  sulfamethoxazole-trimethoprim (BACTRIM DS) 800-160 MG tablet Take 1 tablet by mouth 2 (two) times daily for 7 days. 05/03/23 05/10/23  Particia Nearing, PA-C  tamsulosin (FLOMAX) 0.4 MG CAPS capsule TAKE 1 CAPSULE BY MOUTH EVERY DAY 02/16/23   Doreatha Massed, MD  triamcinolone cream (KENALOG) 0.1 % Apply 1 Application topically daily as needed (tick bite/itching). 04/29/22   [provider]  triazolam (HALCION) 0.25 MG tablet Take 0.25 mg by mouth at bedtime. 05/09/22   [provider]  vitamin B-12 (CYANOCOBALAMIN) 1000 MCG tablet Take 1,000 mcg by mouth every other day.    [provider]      Allergies    Fentanyl, Gabapentin, Nabumetone, and Aspirin    Review of Systems   Review of Systems  All other systems reviewed and are negative.   Physical Exam Updated Vital Signs BP (!) 121/108 (BP Location: Right Arm)   Pulse 92   Temp 98.2 F (36.8 C) (Oral)   Resp 17   Ht 5\' 6"  (1.676 m)   Wt 64 kg   SpO2 94%   BMI 22.76 kg/m  Physical Exam Vitals and nursing note reviewed.  Constitutional:      General: He is not in acute distress.    Appearance: He is well-developed.  HENT:     Head: Normocephalic and atraumatic.  Eyes:     Conjunctiva/sclera: Conjunctivae normal.  Cardiovascular:     Rate and Rhythm: Normal rate and regular rhythm.  Pulmonary:     Effort: Pulmonary effort is normal. No respiratory distress.     Breath sounds: No stridor.  Abdominal:     General: There is no distension.  Skin:    General: Skin is warm and dry.  Neurological:     Mental  Status: He is alert and oriented to person, place, and time.     ED Results / Procedures / Treatments   Labs (all labs ordered are listed, but only abnormal results are displayed) Labs Reviewed  COMPREHENSIVE METABOLIC PANEL - Abnormal; Notable for the following components:      Result Value   Sodium 131 (*)    CO2 21 (*)    Glucose, Bld 137 (*)    Calcium 8.7 (*)    AST 85 (*)    ALT 84 (*)    All other components within normal limits  CBC WITH DIFFERENTIAL/PLATELET - Abnormal; Notable for the following components:   RBC 3.54 (*)    Hemoglobin 11.8 (*)    HCT 35.9 (*)    MCV 101.4 (*)  Lymphs Abs 0.5 (*)    Abs Immature Granulocytes 0.08 (*)    All other components within normal limits  URINALYSIS, ROUTINE W REFLEX MICROSCOPIC - Abnormal; Notable for the following components:   Hgb urine dipstick SMALL (*)    Bacteria, UA RARE (*)    All other components within normal limits  LIPASE, BLOOD  D-DIMER, QUANTITATIVE    EKG None  Radiology DG Thoracic Spine W/Swimmers  Result Date: 05/05/2023 CLINICAL DATA:  Right-sided chest pain, initial encounter EXAM: THORACIC SPINE - 3 VIEWS COMPARISON:  01/15/2023 PET-CT FINDINGS: Right chest wall port is again seen. Hiatal is noted. Cardiac shadow is within normal limits. Pedicles are within normal limits. Chronic appearing compression deformity in the mid thorax is noted. IMPRESSION: No acute abnormality noted. Chronic midthoracic compression deformity is noted. Electronically Signed   By: Alcide Clever M.D.   On: 05/05/2023 21:07   CT Renal Stone Study  Result Date: 05/05/2023 CLINICAL DATA:  Right-sided abdominal pain for 2 weeks EXAM: CT ABDOMEN AND PELVIS WITHOUT CONTRAST TECHNIQUE: Multidetector CT imaging of the abdomen and pelvis was performed following the standard protocol without IV contrast. RADIATION DOSE REDUCTION: This exam was performed according to the departmental dose-optimization program which includes automated  exposure control, adjustment of the mA and/or kV according to patient size and/or use of iterative reconstruction technique. COMPARISON:  04/14/2022 FINDINGS: Lower chest: Mild scarring is noted in the bases bilaterally. Emphysematous changes are seen. Hepatobiliary: Liver shows no focal abnormality. The gallbladder is within normal limits. Persistent prominence of the distal common bile duct is seen stable in appearance from the prior exam. This is likely related to the patient's steady-state as no definitive stone is seen. Pancreas: Unremarkable. No pancreatic ductal dilatation or surrounding inflammatory changes. Spleen: Normal in size without focal abnormality. Adrenals/Urinary Tract: Adrenal glands are within normal limits. Kidneys demonstrate a normal enhancement pattern bilaterally. No obstructive changes are seen. Stable cystic lesion in the midportion of the left kidney is noted unchanged from the prior exam. This has been shown on prior MRI to represent a simple cyst and no further follow-up is recommended. Ureters are within normal limits. The bladder is partially distended. Stomach/Bowel: No obstructive or inflammatory changes of the colon are noted. Inspissated barium is noted within the appendix. No inflammatory changes are seen. Small bowel and stomach are unremarkable with the exception of a moderate-sized hiatal hernia. Vascular/Lymphatic: Aortic atherosclerosis. No enlarged abdominal or pelvic lymph nodes. Reproductive: Prostate is unremarkable. Other: No abdominal wall hernia or abnormality. No abdominopelvic ascites. Musculoskeletal: Extensive fixation hardware is noted throughout the lumbar spine similar to that noted on the prior exam. No hardware failure is seen. IMPRESSION: Chronic changes as described without acute abnormality. Findings to correspond with the given clinical history are noted. Electronically Signed   By: Alcide Clever M.D.   On: 05/05/2023 21:05    Procedures Procedures     Medications Ordered in ED Medications  heparin lock flush 100 unit/mL (has no administration in time range)    ED Course/ Medical Decision Making/ A&P                                 Medical Decision Making Elderly male with COPD, lung cancer presents with ongoing dyspnea, as well as new flank pain in the lower thoracic upper abdominal region.  Patient is tachypneic, mildly tachycardic, and broad differential including pulmonary embolism, kidney stone, cholelithiasis considered.  Cardiac 90 sinus normal Pulse ox 94% with supplemental oxygen abnormal   Amount and/or Complexity of Data Reviewed Independent Historian: spouse External Data Reviewed: notes. Labs: ordered. Decision-making details documented in ED Course. Radiology: ordered and independent interpretation performed. Decision-making details documented in ED Course.  Risk Prescription drug management.   10:50 PM Awake, alert, ambulatory, standing, walking, no distress. Vital signs remain unremarkable. I discussed the findings, after reviewing CT labs with the patient and his wife.  Suggestive of recently passed kidney stone given his description of pain, hematuria, otherwise reassuring findings, with normal D-dimer, little evidence for PE, and the patient is not tachypneic, tachycardic or hypoxic. Patient has scheduled follow-up in 36 hours, and given the reassuring findings as above, patient discharged in stable condition.        Final Clinical Impression(s) / ED Diagnoses Final diagnoses:  Flank pain    Rx / DC Orders ED Discharge Orders     None         Gerhard Munch, MD 05/05/23 2250

## 2023-05-06 NOTE — ED Provider Notes (Signed)
[facility]    CSN: [id number] Arrival date & time: 05/03/23  1511      History   Chief Complaint Chief Complaint  Patient presents with   Abdominal Pain    HPI Maurice Orozco is a 77 y.o. male.   Patient presenting today with severe 8-9/10 right upper quadrant pain radiating toward the back, intermittent nausea for the last 5 days or so but significantly worsening over the last 24 hours.  He states he is never felt this kind of pain before.  He does have a history of issues with gallstones but states this feels very different.  Denies new medications, new foods, recent sick contacts, associated cough, chest pain, shortness of breath, palpitations, melena, hematemesis, fevers.  Trying over-the-counter pain relievers with no relief.  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  He is a former cigarette smoker and current user of chewing tobacco.    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 dense RCA calcification --Non-ischemic Myoview   History of kidney stones    in the 70's   History of radiation therapy 07/04/14, 07/06/14, 07/10/14   LLL lung nodule/54 Gy/3 fx- SBRT   Hypercholesterolemia 05/06/2011   Pneumonia due to Streptococcus    HX. 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   Nodule of upper lobe of left lung 05/19/2022   Abnormal PET scan, lung 05/19/2022   Complex renal cyst, left 05/07/2022   CAP (community acquired pneumonia) 04/26/2020   Lipoma of neck    Dermoid cyst of neck 11/17/2018   PNA (pneumonia) 04/23/2018   Iron deficiency anemia 03/15/2018   Lipoma of back 09/25/2017   Dyslipidemia, goal LDL below 70 03/23/2017   Coronary artery calcification seen on CAT scan 03/23/2017   GERD (gastroesophageal reflux disease) 06/21/2014   Nausea without vomiting 06/21/2014   Primary cancer of left lower lobe of lung (HCC) 06/18/2014   Adenocarcinoma of lung (HCC) 05/06/2011   Essential hypertension 05/06/2011   COPD (chronic obstructive pulmonary disease) (HCC) 05/06/2011   Emphysema 05/06/2011   Hypercholesterolemia 05/06/2011   Back fracture     Past Surgical History:  Procedure Laterality Date   BACK SURGERY  07/04/2017   fusion above previous surgery   BACK SURGERY  2011   metal plate Y-7,W2   BRONCHIAL BIOPSY  06/03/2022   Procedure: BRONCHIAL BIOPSIES;  Surgeon: Josephine Igo, DO;  Location: [facility];  Service: Pulmonary;;   BRONCHIAL BRUSHINGS  06/03/2022   Procedure: BRONCHIAL BRUSHINGS;  Surgeon: Audie Box  L, DO;  Location: [facility];  Service: Pulmonary;;   BRONCHIAL NEEDLE ASPIRATION BIOPSY  06/03/2022   Procedure: BRONCHIAL NEEDLE ASPIRATION BIOPSIES;  Surgeon: Josephine Igo, DO;  Location: [facility];  Service: Pulmonary;;   Coroanry Calcium Score  03/2017   1043. Noted especially in RCA. Dense calcification, recommend Myoview over Cor CTA.   FIDUCIAL MARKER PLACEMENT  06/03/2022   Procedure: FIDUCIAL MARKER PLACEMENT;  Surgeon: Josephine Igo, DO;  Location: [facility];  Service: Pulmonary;;   IR IMAGING GUIDED PORT INSERTION  08/04/2022   LIPOMA EXCISION N/A 10/14/2017   Procedure: EXCISION  LIPOMA ON BACK;  Surgeon: Lucretia Roers, MD;  Location: [facility];  Service: General;  Laterality: N/A;   LIPOMA EXCISION Left 11/19/2018   Procedure: MINOR EXCISION OF SEBACEOUS CYST NECK;  Surgeon: Lucretia Roers, MD;  Location: [facility];  Service: General;  Laterality: Left;  pt knows to arrive at 7:00   LUNG LOBECTOMY  2012   RT UPPER LOBE   NM MYOVIEW LTD  04/2017   EF 60%. Hypertensive response to exercise. (6:21 min, 7.7 METs) No EKG changes. LOW RISK       Home Medications    Prior to Admission medications   Medication Sig Start Date End Date Taking? Authorizing Provider  sulfamethoxazole-trimethoprim (BACTRIM DS) 800-160 MG tablet Take 1 tablet by mouth 2 (two) times daily for 7 days. 05/03/23 05/10/23 Yes Particia Nearing, PA-C  albuterol (PROVENTIL HFA;VENTOLIN HFA) 108 (90 Base) MCG/ACT inhaler Inhale 2 puffs into the lungs every 4 (four) hours as needed for wheezing or shortness of breath. 04/23/18   Glenford Bayley, NP  albuterol (PROVENTIL) (2.5 MG/3ML) 0.083% nebulizer solution Take 2.5 mg by nebulization every 6 (six) hours as needed for wheezing or shortness of breath.    [provider]  betamethasone dipropionate 0.05 % cream Apply topically 2 (two) times daily. 09/30/22   Doreatha Massed, MD  cholecalciferol (VITAMIN D3) 25 MCG (1000 UNIT) tablet Take 1,000 Units by mouth daily.    [provider]  cimetidine (TAGAMET) 200 MG tablet Take 400-600 mg by mouth daily as needed (for heartburn).    [provider]  clobetasol ointment (TEMOVATE) 0.05 % Apply topically 2 (two) times daily as needed. 10/30/22   [provider]  ferrous sulfate 325 (65 FE) MG tablet Take 325 mg by mouth daily with breakfast.    [provider]  fluticasone (FLONASE) 50 MCG/ACT nasal spray Place 2 sprays into both nostrils daily as needed for rhinitis.    [provider]  guaiFENesin (MUCINEX) 600 MG 12 hr tablet Take 600 mg by mouth  daily. 04/28/20   [provider]  hydrochlorothiazide (HYDRODIURIL) 25 MG tablet Take 12.5 mg by mouth daily. 08/14/20   [provider]  HYDROcodone-acetaminophen (NORCO) 10-325 MG tablet Take 1-2 tablets by mouth every 4 (four) hours as needed for moderate pain. 07/28/22   Doreatha Massed, MD  hydrOXYzine (ATARAX) 10 MG tablet TAKE 1 TABLET BY MOUTH THREE TIMES A DAY AS NEEDED 02/09/23   Doreatha Massed, MD  hydrOXYzine (VISTARIL) 25 MG capsule Take 25-50 mg by mouth at bedtime as needed. 10/30/22   [provider]  KLOR-CON M20 20 MEQ tablet TAKE 1 TABLET BY MOUTH EVERY DAY 04/27/23   Doreatha Massed, MD  lidocaine-prilocaine (EMLA) cream Apply a small amount to port a cath site and cover with plastic wrap 1 hour prior to infusion appointments 04/15/23   Doreatha Massed, MD  Menthol, Topical Analgesic, (ICY HOT EX) Apply 1 Application topically daily as needed (pain).    [provider]  naproxen sodium (ALEVE) 220 MG tablet Take 220 mg by mouth daily as needed (pain).    [provider]  Omega-3 Fatty Acids (FISH OIL) 1000 MG CAPS Take 1,000 mg by mouth daily.    [provider]  OVER THE COUNTER MEDICATION Take 1 capsule by mouth daily.    [provider]  Oxycodone HCl 10 MG TABS Take 10 mg by mouth 2 (two) times daily as needed (pain). 09/16/21   [provider]  pantoprazole (PROTONIX) 40 MG tablet Take 40 mg by mouth See admin instructions. Take 40 mg daily, may take a second 40 mg dose as needed for heartburn 03/01/20   [provider]  PEMBROLIZUMAB IV Inject into the vein every 21 ( twenty-one) days. 07/21/22   [provider]  polyethylene glycol powder (GLYCOLAX/MIRALAX) 17 GM/SCOOP powder Take 17 g by mouth daily as needed for mild constipation or moderate constipation.    [provider]  predniSONE (DELTASONE) 5 MG tablet TAKE 1 TABLET BY MOUTH EVERY DAY WITH BREAKFAST 04/29/23    Doreatha Massed, MD  prochlorperazine (COMPAZINE) 10 MG tablet Take 1 tablet (10 mg total) by mouth every 6 (six) hours as needed for nausea or vomiting. Patient not taking: Reported on 04/15/2023 07/21/22   Doreatha Massed, MD  rosuvastatin (CRESTOR) 40 MG tablet Take 40 mg by mouth at bedtime.    [provider]  tamsulosin (FLOMAX) 0.4 MG CAPS capsule TAKE 1 CAPSULE BY MOUTH EVERY DAY 02/16/23   Doreatha Massed, MD  triamcinolone cream (KENALOG) 0.1 % Apply 1 Application topically daily as needed (tick bite/itching). 04/29/22   [provider]  triazolam (HALCION) 0.25 MG tablet Take 0.25 mg by mouth at bedtime. 05/09/22   [provider]  vitamin B-12 (CYANOCOBALAMIN) 1000 MCG tablet Take 1,000 mcg by mouth every other day.    [provider]    Family History Family History  Problem Relation Age of Onset   Hypertension Mother    Kidney disease Father    Heart attack Brother    Cancer Maternal Uncle        prostate cancer   Cancer Maternal Uncle        prostate cancer   Cancer Maternal Uncle        prostate cancer   Cancer Maternal Uncle        prostate cancer    Social History Social History   Tobacco Use   Smoking status: Former    Current packs/day: 0.00    Average packs/day: 1.5 packs/day for 59.0 years (88.5 ttl pk-yrs)    Types: Cigarettes    Start date: 09/09/1950    Quit date: 09/08/2010    Years since quitting: 12.6   Smokeless tobacco: Current    Types: Chew   Tobacco comments:    still chews tobacco, he tells me he hasn't in the past 2 days.   Vaping Use   Vaping status: Never Used  Substance Use Topics   Alcohol use: No   Drug use: No     Allergies   Fentanyl, Gabapentin, Nabumetone, and Aspirin   Review of Systems Review of Systems Per HPI  Physical Exam Triage Vital Signs ED Triage Vitals  Encounter Vitals Group     BP 05/03/23 1518 126/74     Systolic BP Percentile --      Diastolic BP  Percentile --      Pulse Rate 05/03/23 1518 100     Resp 05/03/23 1518 20     Temp 05/03/23 1518 98.9 F (37.2 C)     Temp Source 05/03/23 1518 Oral     SpO2 05/03/23 1518 97 %     Weight --      Height --      Head Circumference --      Peak Flow --      Pain Score 05/03/23 1608 0     Pain Loc --      Pain Education --      Exclude from Growth Chart --    No data found.  Updated Vital Signs BP 126/74 (BP Location: Right Arm)   Pulse 100   Temp 98.9 F (37.2 C) (Oral)   Resp 20   SpO2 97%   Visual Acuity Right Eye Distance:   Left Eye Distance:   Bilateral Distance:    Right Eye Near:   Left Eye Near:    Bilateral Near:     Physical Exam Vitals and nursing note reviewed.  Constitutional:      Comments: Appears in significant discomfort.  HENT:     Head: Atraumatic.     Mouth/Throat:     Mouth: Mucous membranes are moist.  Eyes:     Extraocular Movements: Extraocular movements intact.     Conjunctiva/sclera: Conjunctivae normal.  Cardiovascular:     Rate and Rhythm: Normal rate and regular rhythm.  Pulmonary:     Effort: Pulmonary effort is normal.     Breath sounds: Normal breath sounds.  Abdominal:     General: Bowel sounds are normal. There is no distension.     Palpations: Abdomen is soft.     Tenderness: There is abdominal tenderness. There is guarding. There is no right CVA tenderness or left CVA tenderness.     Comments: Significant upper abdominal tenderness to palpation diffusely worse on the right, negative Murphy sign, no distention though patient has difficulty relaxing his abdominal wall muscles due to positional pain.  Belly is soft when patient is relaxing the muscles  Musculoskeletal:        General: Normal range of motion.     Cervical back: Normal range of motion and neck supple.  Skin:    General: Skin is warm and dry.  Neurological:     General: No focal deficit present.     Mental Status: He is oriented to person, place, and time.   Psychiatric:        Mood and Affect: Mood normal.        Thought Content: Thought content normal.        Judgment: Judgment normal.      UC Treatments / Results  Labs (all labs ordered are listed, but only abnormal results are displayed) Labs Reviewed  URINE CULTURE - Abnormal; Notable for the following components:      Result Value   Culture >=100,000 COLONIES/mL ESCHERICHIA COLI (*)    Organism ID, Bacteria ESCHERICHIA COLI (*)    All other components within normal limits  COMPREHENSIVE METABOLIC PANEL - Abnormal; Notable for the following components:   Glucose 113 (*)    All other components within normal limits   Narrative:    Performed at:  [address] [employer] [address], [place], [place]  [id number] Lab Director: Jolene Schimke MD, Phone:  [phone]  CBC WITH DIFFERENTIAL/PLATELET - Abnormal; Notable for the following components:  WBC 12.4 (*)    RBC 4.05 (*)    MCV 98 (*)    MCH 33.3 (*)    Neutrophils Absolute 11.2 (*)    Lymphocytes Absolute 0.5 (*)    All other components within normal limits   Narrative:    Performed at:  [address] [place], [place]  [id number] Lab Director: Jolene Schimke MD, Phone:  [phone]  POCT URINALYSIS DIP (MANUAL ENTRY) - Abnormal; Notable for the following components:   Bilirubin, UA small (*)    Nitrite, UA Positive (*)    Leukocytes, UA Small (1+) (*)    All other components within normal limits  LIPASE   Narrative:    Performed at:  [address], [place], [place]  [id number] Lab Director: Jolene Schimke MD, Phone:  [phone]    EKG   Radiology DG Thoracic Spine W/Swimmers  Result Date: 05/05/2023 CLINICAL DATA:  Right-sided chest pain, initial encounter EXAM: THORACIC SPINE - 3 VIEWS COMPARISON:  01/15/2023 PET-CT FINDINGS: Right chest wall port is again seen. Hiatal is noted. Cardiac shadow is within normal limits. Pedicles are within normal limits.  Chronic appearing compression deformity in the mid thorax is noted. IMPRESSION: No acute abnormality noted. Chronic midthoracic compression deformity is noted. Electronically Signed   By: Alcide Clever M.D.   On: 05/05/2023 21:07   CT Renal Stone Study  Result Date: 05/05/2023 CLINICAL DATA:  Right-sided abdominal pain for 2 weeks EXAM: CT ABDOMEN AND PELVIS WITHOUT CONTRAST TECHNIQUE: Multidetector CT imaging of the abdomen and pelvis was performed following the standard protocol without IV contrast. RADIATION DOSE REDUCTION: This exam was performed according to the departmental dose-optimization program which includes automated exposure control, adjustment of the mA and/or kV according to patient size and/or use of iterative reconstruction technique. COMPARISON:  04/14/2022 FINDINGS: Lower chest: Mild scarring is noted in the bases bilaterally. Emphysematous changes are seen. Hepatobiliary: Liver shows no focal abnormality. The gallbladder is within normal limits. Persistent prominence of the distal common bile duct is seen stable in appearance from the prior exam. This is likely related to the patient's steady-state as no definitive stone is seen. Pancreas: Unremarkable. No pancreatic ductal dilatation or surrounding inflammatory changes. Spleen: Normal in size without focal abnormality. Adrenals/Urinary Tract: Adrenal glands are within normal limits. Kidneys demonstrate a normal enhancement pattern bilaterally. No obstructive changes are seen. Stable cystic lesion in the midportion of the left kidney is noted unchanged from the prior exam. This has been shown on prior MRI to represent a simple cyst and no further follow-up is recommended. Ureters are within normal limits. The bladder is partially distended. Stomach/Bowel: No obstructive or inflammatory changes of the colon are noted. Inspissated barium is noted within the appendix. No inflammatory changes are seen. Small bowel and stomach are unremarkable  with the exception of a moderate-sized hiatal hernia. Vascular/Lymphatic: Aortic atherosclerosis. No enlarged abdominal or pelvic lymph nodes. Reproductive: Prostate is unremarkable. Other: No abdominal wall hernia or abnormality. No abdominopelvic ascites. Musculoskeletal: Extensive fixation hardware is noted throughout the lumbar spine similar to that noted on the prior exam. No hardware failure is seen. IMPRESSION: Chronic changes as described without acute abnormality. Findings to correspond with the given clinical history are noted. Electronically Signed   By: Alcide Clever M.D.   On: 05/05/2023 21:05    Procedures Procedures (including critical care time)  Medications Ordered in UC Medications  ketorolac (TORADOL) 30 MG/ML injection 30 mg (30 mg Intramuscular  Given 05/03/23 1602)    Initial Impression / Assessment and Plan / UC Course  I have reviewed the triage vital signs and the nursing notes.  Pertinent labs & imaging results that were available during my care of the patient were reviewed by me and considered in my medical decision making (see chart for details).     Urinalysis showing evidence of a urinary tract infection, though given his symptoms set suspect this is an incidental finding more than anything.  Will treat with Bactrim while awaiting urine culture for further evaluation of this.  Regarding his upper abdominal pain, unclear etiology at this time.  Labs pending for further evaluation but did discuss particularly if pain is worsening he needs to go to the emergency department for further immediate evaluation and imaging.  He does not wish to do this today so we will obtain labs, give a shot of Toradol for pain and inflammation and discussed supportive home care and strict return precautions.  He plans to follow-up with his PCP tomorrow or the next day as well.  Final Clinical Impressions(s) / UC Diagnoses   Final diagnoses:  RUQ pain  Right flank pain  Acute lower UTI      Discharge Instructions      We are treating for a urinary tract infection, which may be an incidental finding today.  I am not convinced that this is causing all of your upper abdominal pain.  A urine culture has been sent out for further evaluation of this and we have also sent out some labs that should be back tomorrow.  We have given you a shot of Toradol, a strong anti-inflammatory pain medication so do not take any ibuprofen or Aleve over the next 2 days only Tylenol.  Follow-up as soon as possible with your primary care provider and go to the emergency department for worsening symptoms.    ED Prescriptions     Medication Sig Dispense Auth. Provider   sulfamethoxazole-trimethoprim (BACTRIM DS) 800-160 MG tablet Take 1 tablet by mouth 2 (two) times daily for 7 days. 14 tablet Particia Nearing, [place]      PDMP not reviewed this encounter.   Particia Nearing, [place] 05/06/23 1056

## 2023-05-07 ENCOUNTER — Inpatient Hospital Stay: Payer: Medicare Other

## 2023-05-10 ENCOUNTER — Other Ambulatory Visit: Payer: Self-pay

## 2023-05-13 DIAGNOSIS — N390 Urinary tract infection, site not specified: Secondary | ICD-10-CM | POA: Diagnosis not present

## 2023-05-14 ENCOUNTER — Ambulatory Visit (HOSPITAL_COMMUNITY)
Admission: RE | Admit: 2023-05-14 | Discharge: 2023-05-14 | Disposition: A | Discharge status: Home / Self Care | Payer: Medicare Other | Source: Ambulatory Visit | Attending: Internal Medicine | Admitting: Internal Medicine

## 2023-05-14 DIAGNOSIS — R1011 Right upper quadrant pain: Secondary | ICD-10-CM | POA: Insufficient documentation

## 2023-05-14 DIAGNOSIS — K828 Other specified diseases of gallbladder: Secondary | ICD-10-CM | POA: Diagnosis not present

## 2023-05-14 DIAGNOSIS — N281 Cyst of kidney, acquired: Secondary | ICD-10-CM | POA: Diagnosis not present

## 2023-05-14 DIAGNOSIS — K838 Other specified diseases of biliary tract: Secondary | ICD-10-CM | POA: Diagnosis not present

## 2023-05-18 ENCOUNTER — Encounter: Payer: Self-pay | Admitting: Hematology

## 2023-05-28 ENCOUNTER — Inpatient Hospital Stay: Payer: Medicare Other

## 2023-05-28 ENCOUNTER — Inpatient Hospital Stay: Payer: Medicare Other | Attending: Physician Assistant

## 2023-05-28 VITALS — BP 119/65 | HR 81 | Temp 98.2°F | Resp 18

## 2023-05-28 DIAGNOSIS — Z87891 Personal history of nicotine dependence: Secondary | ICD-10-CM | POA: Diagnosis not present

## 2023-05-28 DIAGNOSIS — Z79620 Long term (current) use of immunosuppressive biologic: Secondary | ICD-10-CM | POA: Diagnosis not present

## 2023-05-28 DIAGNOSIS — C3412 Malignant neoplasm of upper lobe, left bronchus or lung: Secondary | ICD-10-CM | POA: Diagnosis not present

## 2023-05-28 DIAGNOSIS — Z95828 Presence of other vascular implants and grafts: Secondary | ICD-10-CM

## 2023-05-28 DIAGNOSIS — C7951 Secondary malignant neoplasm of bone: Secondary | ICD-10-CM | POA: Diagnosis not present

## 2023-05-28 DIAGNOSIS — Z5112 Encounter for antineoplastic immunotherapy: Secondary | ICD-10-CM | POA: Insufficient documentation

## 2023-05-28 LAB — URINALYSIS, ROUTINE W REFLEX MICROSCOPIC
Bilirubin Urine: NEGATIVE
Glucose, UA: NEGATIVE mg/dL
Ketones, ur: NEGATIVE mg/dL
Nitrite: NEGATIVE
Protein, ur: NEGATIVE mg/dL
Specific Gravity, Urine: 1.004 — ABNORMAL LOW (ref 1.005–1.030)
pH: 7 (ref 5.0–8.0)

## 2023-05-28 LAB — COMPREHENSIVE METABOLIC PANEL
ALT: 54 U/L — ABNORMAL HIGH (ref 0–44)
AST: 39 U/L (ref 15–41)
Albumin: 3.6 g/dL (ref 3.5–5.0)
Alkaline Phosphatase: 73 U/L (ref 38–126)
Anion gap: 12 (ref 5–15)
BUN: 24 mg/dL — ABNORMAL HIGH (ref 8–23)
CO2: 24 mmol/L (ref 22–32)
Calcium: 8.8 mg/dL — ABNORMAL LOW (ref 8.9–10.3)
Chloride: 99 mmol/L (ref 98–111)
Creatinine, Ser: 0.86 mg/dL (ref 0.61–1.24)
GFR, Estimated: >60 mL/min (ref >60)
Glucose, Bld: 125 mg/dL — ABNORMAL HIGH (ref 70–99)
Potassium: 2.9 mmol/L — ABNORMAL LOW (ref 3.5–5.1)
Sodium: 135 mmol/L (ref 135–145)
Total Bilirubin: 1 mg/dL (ref 0.3–1.2)
Total Protein: 6.9 g/dL (ref 6.5–8.1)

## 2023-05-28 LAB — CBC WITH DIFFERENTIAL/PLATELET
Abs Immature Granulocytes: 0.1 10*3/uL — ABNORMAL HIGH (ref 0.00–0.07)
Basophils Absolute: 0.1 10*3/uL (ref 0.0–0.1)
Basophils Relative: 0 %
Eosinophils Absolute: 0.2 10*3/uL (ref 0.0–0.5)
Eosinophils Relative: 1 %
HCT: 38.3 % — ABNORMAL LOW (ref 39.0–52.0)
Hemoglobin: 12.9 g/dL — ABNORMAL LOW (ref 13.0–17.0)
Immature Granulocytes: 1 %
Lymphocytes Relative: 4 %
Lymphs Abs: 0.6 10*3/uL — ABNORMAL LOW (ref 0.7–4.0)
MCH: 33.5 pg (ref 26.0–34.0)
MCHC: 33.7 g/dL (ref 30.0–36.0)
MCV: 99.5 fL (ref 80.0–100.0)
Monocytes Absolute: 0.9 10*3/uL (ref 0.1–1.0)
Monocytes Relative: 6 %
Neutro Abs: 13.3 10*3/uL — ABNORMAL HIGH (ref 1.7–7.7)
Neutrophils Relative %: 88 %
Platelets: 262 10*3/uL (ref 150–400)
RBC: 3.85 MIL/uL — ABNORMAL LOW (ref 4.22–5.81)
RDW: 11.8 % (ref 11.5–15.5)
WBC: 15.1 10*3/uL — ABNORMAL HIGH (ref 4.0–10.5)
nRBC: 0 % (ref 0.0–0.2)

## 2023-05-28 LAB — MAGNESIUM: Magnesium: 2 mg/dL (ref 1.7–2.4)

## 2023-05-28 MED ORDER — SODIUM CHLORIDE 0.9 % IV SOLN
200.0000 mg | Freq: Once | INTRAVENOUS | Status: AC
  Administered 2023-05-28: 200 mg via INTRAVENOUS
  Filled 2023-05-28: qty 8

## 2023-05-28 MED ORDER — POTASSIUM CHLORIDE CRYS ER 20 MEQ PO TBCR
40.0000 meq | EXTENDED_RELEASE_TABLET | Freq: Once | ORAL | Status: AC
  Administered 2023-05-28: 40 meq via ORAL
  Filled 2023-05-28: qty 2

## 2023-05-28 MED ORDER — SODIUM CHLORIDE 0.9% FLUSH
10.0000 mL | INTRAVENOUS | Status: DC | PRN
  Administered 2023-05-28: 10 mL

## 2023-05-28 MED ORDER — HEPARIN SOD (PORK) LOCK FLUSH 100 UNIT/ML IV SOLN
500.0000 [IU] | Freq: Once | INTRAVENOUS | Status: AC | PRN
  Administered 2023-05-28: 500 [IU]

## 2023-05-28 MED ORDER — SODIUM CHLORIDE 0.9% FLUSH
10.0000 mL | Freq: Once | INTRAVENOUS | Status: AC
  Administered 2023-05-28: 10 mL via INTRAVENOUS

## 2023-05-28 MED ORDER — SODIUM CHLORIDE 0.9 % IV SOLN: Freq: Once | INTRAVENOUS | Status: AC

## 2023-05-28 NOTE — Progress Notes (Signed)
Patient presents today for Keytruda. Vital signs within parameters for treatment. Last TSH 04/15/2023 1.295. Labs within parameters for treatment. Potassium 2.9 today. Standing orders followed. Patient will receive 40 mEq of potassium PO x 1 dose.   Treatment given today per MD orders. Tolerated infusion without adverse affects. Vital signs stable. No complaints at this time. Discharged from clinic ambulatory in stable condition. Alert and oriented x 3. F/U with [facility] as scheduled.

## 2023-05-28 NOTE — Patient Instructions (Signed)
[facility]  Discharge Instructions: Thank you for choosing [facility] to provide your oncology and hematology care.  If you have a lab appointment with the Cancer Center - please note that after April 8th, 2024, all labs will be drawn in the cancer center.  You do not have to check in or register with the main entrance as you have in the past but will complete your check-in in the cancer center.  Wear comfortable clothing and clothing appropriate for easy access to any Portacath or PICC line.   We strive to give you quality time with your provider. You may need to reschedule your appointment if you arrive late (15 or more minutes).  Arriving late affects you and other patients whose appointments are after yours.  Also, if you miss three or more appointments without notifying the office, you may be dismissed from the clinic at the provider's discretion.      For prescription refill requests, have your pharmacy contact our office and allow 72 hours for refills to be completed.    Today you received the following chemotherapy and/or immunotherapy agents Keytruda. Pembrolizumab Injection What is this medication? PEMBROLIZUMAB (PEM broe LIZ ue mab) treats some types of cancer. It works by helping your immune system slow or stop the spread of cancer cells. It is a monoclonal antibody. This medicine may be used for other purposes; ask your health care provider or pharmacist if you have questions. COMMON BRAND NAME(S): Keytruda What should I tell my care team before I take this medication? They need to know if you have any of these conditions: Allogeneic stem cell transplant (uses someone else's stem cells) Autoimmune diseases, such as Crohn disease, ulcerative colitis, lupus History of chest radiation Nervous system problems, such as Guillain-Barre syndrome, myasthenia gravis Organ transplant An unusual or allergic reaction to pembrolizumab, other medications,  foods, dyes, or preservatives Pregnant or trying to get pregnant Breast-feeding How should I use this medication? This medication is injected into a vein. It is given by your care team in a hospital or clinic setting. A special MedGuide will be given to you before each treatment. Be sure to read this information carefully each time. Talk to your care team about the use of this medication in children. While it may be prescribed for children as young as 6 months for selected conditions, precautions do apply. Overdosage: If you think you have taken too much of this medicine contact a poison control center or emergency room at once. NOTE: This medicine is only for you. Do not share this medicine with others. What if I miss a dose? Keep appointments for follow-up doses. It is important not to miss your dose. Call your care team if you are unable to keep an appointment. What may interact with this medication? Interactions have not been studied. This list may not describe all possible interactions. Give your health care provider a list of all the medicines, herbs, non-prescription drugs, or dietary supplements you use. Also tell them if you smoke, drink alcohol, or use illegal drugs. Some items may interact with your medicine. What should I watch for while using this medication? Your condition will be monitored carefully while you are receiving this medication. You may need blood work while taking this medication. This medication may cause serious skin reactions. They can happen weeks to months after starting the medication. Contact your care team right away if you notice fevers or flu-like symptoms with a rash. The rash   may be red or purple and then turn into blisters or peeling of the skin. You may also notice a red rash with swelling of the face, lips, or lymph nodes in your neck or under your arms. Tell your care team right away if you have any change in your eyesight. Talk to your care team if you  may be pregnant. Serious birth defects can occur if you take this medication during pregnancy and for 4 months after the last dose. You will need a negative pregnancy test before starting this medication. Contraception is recommended while taking this medication and for 4 months after the last dose. Your care team can help you find the option that works for you. Do not breastfeed while taking this medication and for 4 months after the last dose. What side effects may I notice from receiving this medication? Side effects that you should report to your care team as soon as possible: Allergic reactions--skin rash, itching, hives, swelling of the face, lips, tongue, or throat Dry cough, shortness of breath or trouble breathing Eye pain, redness, irritation, or discharge with blurry or decreased vision Heart muscle inflammation--unusual weakness or fatigue, shortness of breath, chest pain, fast or irregular heartbeat, dizziness, swelling of the ankles, feet, or hands Hormone gland problems--headache, sensitivity to light, unusual weakness or fatigue, dizziness, fast or irregular heartbeat, increased sensitivity to cold or heat, excessive sweating, constipation, hair loss, increased thirst or amount of urine, tremors or shaking, irritability Infusion reactions--chest pain, shortness of breath or trouble breathing, feeling faint or lightheaded Kidney injury (glomerulonephritis)--decrease in the amount of urine, red or dark [place] urine, foamy or bubbly urine, swelling of the ankles, hands, or feet Liver injury--right upper belly pain, loss of appetite, nausea, light-colored stool, dark yellow or [place] urine, yellowing skin or eyes, unusual weakness or fatigue Pain, tingling, or numbness in the hands or feet, muscle weakness, change in vision, confusion or trouble speaking, loss of balance or coordination, trouble walking, seizures Rash, fever, and swollen lymph nodes Redness, blistering, peeling, or loosening  of the skin, including inside the mouth Sudden or severe stomach pain, bloody diarrhea, fever, nausea, vomiting Side effects that usually do not require medical attention (report to your care team if they continue or are bothersome): Bone, joint, or muscle pain Diarrhea Fatigue Loss of appetite Nausea Skin rash This list may not describe all possible side effects. Call your doctor for medical advice about side effects. You may report side effects to FDA at 1-800-FDA-1088. Where should I keep my medication? This medication is given in a hospital or clinic. It will not be stored at home. NOTE: This sheet is a summary. It may not cover all possible information. If you have questions about this medicine, talk to your doctor, pharmacist, or health care provider.  2024 Elsevier/Gold Standard (2022-01-07 00:00:00)       To help prevent nausea and vomiting after your treatment, we encourage you to take your nausea medication as directed.  BELOW ARE SYMPTOMS THAT SHOULD BE REPORTED IMMEDIATELY: *FEVER GREATER THAN 100.4 F (38 C) OR HIGHER *CHILLS OR SWEATING *NAUSEA AND VOMITING THAT IS NOT CONTROLLED WITH YOUR NAUSEA MEDICATION *UNUSUAL SHORTNESS OF BREATH *UNUSUAL BRUISING OR BLEEDING *URINARY PROBLEMS (pain or burning when urinating, or frequent urination) *BOWEL PROBLEMS (unusual diarrhea, constipation, pain near the anus) TENDERNESS IN MOUTH AND THROAT WITH OR WITHOUT PRESENCE OF ULCERS (sore throat, sores in mouth, or a toothache) UNUSUAL RASH, SWELLING OR PAIN  UNUSUAL VAGINAL DISCHARGE OR ITCHING     Items with * indicate a potential emergency and should be followed up as soon as possible or go to the Emergency Department if any problems should occur.  Please show the CHEMOTHERAPY ALERT CARD or IMMUNOTHERAPY ALERT CARD at check-in to the Emergency Department and triage nurse.  Should you have questions after your visit or need to cancel or reschedule your appointment, please contact  [facility] [phone]  and follow the prompts.  Office hours are 8:00 a.m. to 4:30 p.m. Monday - Friday. Please note that voicemails left after 4:00 p.m. may not be returned until the following business day.  We are closed weekends and major holidays. You have access to a nurse at all times for urgent questions. Please call the main number to the clinic [phone] and follow the prompts.  For any non-urgent questions, you may also contact your provider using MyChart. We now offer e-Visits for anyone 18 and older to request care online for non-urgent symptoms. For details visit [email or web address].   Also download the MyChart app! Go to the app store, search "MyChart", open the app, select [facility], and log in with your MyChart username and password.   

## 2023-05-30 ENCOUNTER — Other Ambulatory Visit: Payer: Self-pay

## 2023-06-03 DIAGNOSIS — Z6821 Body mass index (BMI) 21.0-21.9, adult: Secondary | ICD-10-CM | POA: Diagnosis not present

## 2023-06-03 DIAGNOSIS — N390 Urinary tract infection, site not specified: Secondary | ICD-10-CM | POA: Diagnosis not present

## 2023-06-03 DIAGNOSIS — J449 Chronic obstructive pulmonary disease, unspecified: Secondary | ICD-10-CM | POA: Diagnosis not present

## 2023-06-03 DIAGNOSIS — N410 Acute prostatitis: Secondary | ICD-10-CM | POA: Diagnosis not present

## 2023-06-03 DIAGNOSIS — C3490 Malignant neoplasm of unspecified part of unspecified bronchus or lung: Secondary | ICD-10-CM | POA: Diagnosis not present

## 2023-06-03 DIAGNOSIS — M48062 Spinal stenosis, lumbar region with neurogenic claudication: Secondary | ICD-10-CM | POA: Diagnosis not present

## 2023-06-03 DIAGNOSIS — I10 Essential (primary) hypertension: Secondary | ICD-10-CM | POA: Diagnosis not present

## 2023-06-03 DIAGNOSIS — M1991 Primary osteoarthritis, unspecified site: Secondary | ICD-10-CM | POA: Diagnosis not present

## 2023-06-03 DIAGNOSIS — M546 Pain in thoracic spine: Secondary | ICD-10-CM | POA: Diagnosis not present

## 2023-06-08 DIAGNOSIS — I10 Essential (primary) hypertension: Secondary | ICD-10-CM | POA: Diagnosis not present

## 2023-06-08 DIAGNOSIS — E876 Hypokalemia: Secondary | ICD-10-CM | POA: Diagnosis not present

## 2023-06-08 DIAGNOSIS — J0100 Acute maxillary sinusitis, unspecified: Secondary | ICD-10-CM | POA: Diagnosis not present

## 2023-06-08 DIAGNOSIS — J449 Chronic obstructive pulmonary disease, unspecified: Secondary | ICD-10-CM | POA: Diagnosis not present

## 2023-06-11 ENCOUNTER — Ambulatory Visit (HOSPITAL_COMMUNITY)
Admission: RE | Admit: 2023-06-11 | Discharge: 2023-06-11 | Disposition: A | Discharge status: Home / Self Care | Payer: Medicare Other | Source: Ambulatory Visit | Attending: Hematology | Admitting: Hematology

## 2023-06-11 DIAGNOSIS — C3412 Malignant neoplasm of upper lobe, left bronchus or lung: Secondary | ICD-10-CM | POA: Diagnosis not present

## 2023-06-11 DIAGNOSIS — J439 Emphysema, unspecified: Secondary | ICD-10-CM | POA: Diagnosis not present

## 2023-06-11 DIAGNOSIS — R918 Other nonspecific abnormal finding of lung field: Secondary | ICD-10-CM | POA: Diagnosis not present

## 2023-06-11 DIAGNOSIS — N281 Cyst of kidney, acquired: Secondary | ICD-10-CM | POA: Diagnosis not present

## 2023-06-11 MED ORDER — IOHEXOL 300 MG/ML  SOLN
100.0000 mL | Freq: Once | INTRAMUSCULAR | Status: AC | PRN
  Administered 2023-06-11: 100 mL via INTRAVENOUS

## 2023-06-15 ENCOUNTER — Other Ambulatory Visit: Payer: Self-pay | Admitting: *Deleted

## 2023-06-15 DIAGNOSIS — K8020 Calculus of gallbladder without cholecystitis without obstruction: Secondary | ICD-10-CM

## 2023-06-16 ENCOUNTER — Ambulatory Visit: Payer: Medicare Other | Admitting: General Surgery

## 2023-06-16 ENCOUNTER — Encounter: Payer: Self-pay | Admitting: General Surgery

## 2023-06-16 VITALS — BP 143/80 | HR 87 | Temp 98.0°F | Resp 18 | Ht 66.0 in | Wt 143.0 lb

## 2023-06-16 DIAGNOSIS — K8010 Calculus of gallbladder with chronic cholecystitis without obstruction: Secondary | ICD-10-CM

## 2023-06-16 NOTE — Patient Instructions (Signed)
Will figure out timing of surgery and if you need to hold [place].   Minimally Invasive Cholecystectomy Minimally invasive cholecystectomy is surgery to remove the gallbladder. The gallbladder is a pear-shaped organ that lies beneath the liver on the right side of the body. The gallbladder stores bile, which is a fluid that helps the body digest fats. Cholecystectomy is often done to treat inflammation (irritation and swelling) of the gallbladder (cholecystitis). This condition is usually caused by a buildup of gallstones (cholelithiasis) in the gallbladder or when the fluid in the gall bladder becomes stagnant because gallstones get stuck in the ducts (tubes) and block the flow of bile. This can result in inflammation and pain. In severe cases, emergency surgery may be required. This procedure is done through small incisions in the abdomen, instead of one large incision. It is also called laparoscopic surgery. A thin scope with a camera (laparoscope) is inserted through one incision. Then surgical instruments are inserted through the other incisions. In some cases, a minimally invasive surgery may need to be changed to a surgery that is done through a larger incision. This is called open surgery. Tell a health care provider about: Any allergies you have. All medicines you are taking, including vitamins, herbs, eye drops, creams, and over-the-counter medicines. Any problems you or family members have had with anesthetic medicines. Any bleeding problems you have. Any surgeries you have had. Any medical conditions you have. Whether you are pregnant or may be pregnant. What are the risks? Generally, this is a safe procedure. However, problems may occur, including: Infection. Bleeding. Allergic reactions to medicines. Damage to nearby structures or organs. A gallstone remaining in the common bile duct. The common bile duct carries bile from the gallbladder to the small intestine. A bile leak from  the liver or cystic duct after your gallbladder is removed. What happens before the procedure? Medicines Ask your health care provider about: Changing or stopping your regular medicines. This is especially important if you are taking diabetes medicines or blood thinners. Taking medicines such as aspirin and ibuprofen. These medicines can thin your blood. Do not take these medicines unless your health care provider tells you to take them. Taking over-the-counter medicines, vitamins, herbs, and supplements. General instructions If you will be going home right after the procedure, plan to have a responsible adult: Take you home from the hospital or clinic. You will not be allowed to drive. Care for you for the time you are told. Do not use any products that contain nicotine or tobacco for at least 4 weeks before the procedure. These products include cigarettes, chewing tobacco, and vaping devices, such as e-cigarettes. If you need help quitting, ask your health care provider. Ask your health care provider: How your surgery site will be marked. What steps will be taken to help prevent infection. These may include: Removing hair at the surgery site. Washing skin with a germ-killing soap. Taking antibiotic medicine. What happens during the procedure?  An IV will be inserted into one of your veins. You will be given one or both of the following: A medicine to help you relax (sedative). A medicine to make you fall asleep (general anesthetic). Your surgeon will make several small incisions in your abdomen. The laparoscope will be inserted through one of the small incisions. The camera on the laparoscope will send images to a monitor in the operating room. This lets your surgeon see inside your abdomen. A gas will be pumped into your abdomen. This will expand  your abdomen to give the surgeon more room to perform the surgery. Other tools that are needed for the procedure will be inserted through the  other incisions. The gallbladder will be removed through one of the incisions. Your common bile duct may be examined. If stones are found in the common bile duct, they may be removed. After your gallbladder has been removed, the incisions will be closed with stitches (sutures), staples, or skin glue. Your incisions will be covered with a bandage (dressing). The procedure may vary among health care providers and hospitals. What happens after the procedure? Your blood pressure, heart rate, breathing rate, and blood oxygen level will be monitored until you leave the hospital or clinic. You will be given medicines as needed to control your pain. You may have a drain placed in the incision. The drain will be removed a day or two after the procedure. Summary Minimally invasive cholecystectomy, also called laparoscopic cholecystectomy, is surgery to remove the gallbladder using small incisions. Tell your health care provider about all the medical conditions you have and all the medicines you are taking for those conditions. Before the procedure, follow instructions about when to stop eating and drinking and changing or stopping medicines. Plan to have a responsible adult care for you for the time you are told after you leave the hospital or clinic. This information is not intended to replace advice given to you by your health care provider. Make sure you discuss any questions you have with your health care provider. Document Revised: 02/26/2021 Document Reviewed: 02/26/2021 Elsevier Patient Education  2024 [employer].

## 2023-06-16 NOTE — Progress Notes (Unsigned)
[facility] History and Physical  Reason for Referral:*** Referring Physician: ***  Chief Complaint   New Patient (Initial Visit)     Maurice Orozco is a 77 y.o. male.  HPI: ***.  The *** started *** and has had a duration of ***.  It is associated with ***.  The *** is improved with ***, and is made worse with ***.    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 dense RCA calcification --Non-ischemic Myoview   History of kidney stones    in the 70's   History of radiation therapy 07/04/14, 07/06/14, 07/10/14   LLL lung nodule/54 Gy/3 fx- SBRT   Hypercholesterolemia 05/06/2011   Pneumonia due to Streptococcus    HX. POST OPERATIVELY   Port-A-Cath in place 07/21/2022   Pre-diabetes    Stress fx pelvis    DUE TO AA IN 1970    Past Surgical History:  Procedure Laterality Date   BACK SURGERY  07/04/2017   fusion above previous surgery   BACK SURGERY  2011   metal plate N-8,G9   BRONCHIAL BIOPSY  06/03/2022   Procedure: BRONCHIAL BIOPSIES;  Surgeon: Josephine Igo, DO;  Location: [facility];  Service: Pulmonary;;   BRONCHIAL BRUSHINGS  06/03/2022   Procedure: BRONCHIAL BRUSHINGS;  Surgeon: Josephine Igo, DO;  Location: [facility];  Service: Pulmonary;;   BRONCHIAL NEEDLE ASPIRATION BIOPSY  06/03/2022   Procedure: BRONCHIAL NEEDLE ASPIRATION BIOPSIES;  Surgeon: Josephine Igo, DO;  Location: [facility];  Service: Pulmonary;;   Coroanry Calcium Score  03/2017    1043. Noted especially in RCA. Dense calcification, recommend Myoview over Cor CTA.   FIDUCIAL MARKER PLACEMENT  06/03/2022   Procedure: FIDUCIAL MARKER PLACEMENT;  Surgeon: Josephine Igo, DO;  Location: [facility];  Service: Pulmonary;;   IR IMAGING GUIDED PORT INSERTION  08/04/2022   LIPOMA EXCISION N/A 10/14/2017   Procedure: EXCISION LIPOMA ON BACK;  Surgeon: Lucretia Roers, MD;  Location: [facility];  Service: General;  Laterality: N/A;   LIPOMA EXCISION Left 11/19/2018   Procedure: MINOR EXCISION OF SEBACEOUS CYST NECK;  Surgeon: Lucretia Roers, MD;  Location: [facility];  Service: General;  Laterality: Left;  pt knows to arrive at 7:00   LUNG LOBECTOMY  2012   RT UPPER LOBE   NM MYOVIEW LTD  04/2017   EF 60%. Hypertensive response to exercise. (6:21 min, 7.7 METs) No EKG changes. LOW RISK    Family History  Problem Relation Age of Onset   Hypertension Mother    Kidney disease Father    Heart attack Brother    Cancer Maternal Uncle        prostate cancer   Cancer Maternal Uncle        prostate cancer   Cancer Maternal Uncle        prostate cancer   Cancer Maternal Uncle  prostate cancer    Social History   Tobacco Use   Smoking status: Former    Current packs/day: 0.00    Average packs/day: 1.5 packs/day for 59.0 years (88.5 ttl pk-yrs)    Types: Cigarettes    Start date: 09/09/1950    Quit date: 09/08/2010    Years since quitting: 12.7   Smokeless tobacco: Current    Types: Chew   Tobacco comments:    still chews tobacco, he tells me he hasn't in the past 2 days.   Vaping Use   Vaping status: Never Used  Substance Use Topics   Alcohol use: No   Drug use: No    Medications: {medication reviewed/display:3041432} Allergies as of 06/16/2023       Reactions   Fentanyl Other (See Comments)   CAUSED HALLUCINATIONS/05-03-13 pt reports it was oral fentanyl that caused hallucinations   Gabapentin Other (See Comments)   Bones throbbed, headaches, weakness    Nabumetone    Unknown reaction    Aspirin Nausea Only        Medication List        Accurate as of June 16, 2023 12:21 PM. If you have any questions, ask your nurse or doctor.          STOP taking these medications    betamethasone dipropionate 0.05 % cream Stopped by: Lucretia Roers   hydrOXYzine 10 MG tablet Commonly known as: ATARAX Stopped by: Lucretia Roers       TAKE these medications    albuterol (2.5 MG/3ML) 0.083% nebulizer solution Commonly known as: PROVENTIL Take 2.5 mg by nebulization every 6 (six) hours as needed for wheezing or shortness of breath.   albuterol 108 (90 Base) MCG/ACT inhaler Commonly known as: VENTOLIN HFA Inhale 2 puffs into the lungs every 4 (four) hours as needed for wheezing or shortness of breath.   cholecalciferol 25 MCG (1000 UNIT) tablet Commonly known as: VITAMIN D3 Take 1,000 Units by mouth daily.   cimetidine 200 MG tablet Commonly known as: TAGAMET Take 400-600 mg by mouth daily as needed (for heartburn).   clobetasol ointment 0.05 % Commonly known as: TEMOVATE Apply topically 2 (two) times daily as needed.   cyanocobalamin 1000 MCG tablet Commonly known as: VITAMIN B12 Take 1,000 mcg by mouth every other day.   ferrous sulfate 325 (65 FE) MG tablet Take 325 mg by mouth daily with breakfast.   Fish Oil 1000 MG Caps Take 1,000 mg by mouth daily.   fluticasone 50 MCG/ACT nasal spray Commonly known as: FLONASE Place 2 sprays into both nostrils daily as needed for rhinitis.   guaiFENesin 600 MG 12 hr tablet Commonly known as: MUCINEX Take 600 mg by mouth daily.   hydrochlorothiazide 25 MG tablet Commonly known as: HYDRODIURIL Take 12.5 mg by mouth daily.   HYDROcodone-acetaminophen 10-325 MG tablet Commonly known as: NORCO Take 1-2 tablets by mouth every 4 (four) hours as needed for moderate pain.   hydrOXYzine 25 MG capsule Commonly known as: VISTARIL Take 25-50 mg by mouth at bedtime as  needed.   ICY HOT EX Apply 1 Application topically daily as needed (pain).   Klor-Con M20 20 MEQ tablet Generic drug: potassium chloride SA TAKE 1 TABLET BY MOUTH EVERY DAY   lidocaine-prilocaine cream Commonly known as: EMLA Apply a small amount to port a cath site and cover with plastic wrap 1 hour prior to infusion appointments   naproxen sodium 220 MG tablet Commonly known as: ALEVE Take 220  mg by mouth daily as needed (pain).   OVER THE COUNTER MEDICATION Take 1 capsule by mouth daily.   Oxycodone HCl 10 MG Tabs Take 10 mg by mouth 2 (two) times daily as needed (pain).   pantoprazole 40 MG tablet Commonly known as: PROTONIX Take 40 mg by mouth See admin instructions. Take 40 mg daily, may take a second 40 mg dose as needed for heartburn   PEMBROLIZUMAB IV Inject into the vein every 21 ( twenty-one) days.   polyethylene glycol powder 17 GM/SCOOP powder Commonly known as: GLYCOLAX/MIRALAX Take 17 g by mouth daily as needed for mild constipation or moderate constipation.   predniSONE 5 MG tablet Commonly known as: DELTASONE TAKE 1 TABLET BY MOUTH EVERY DAY WITH BREAKFAST   prochlorperazine 10 MG tablet Commonly known as: COMPAZINE Take 1 tablet (10 mg total) by mouth every 6 (six) hours as needed for nausea or vomiting.   rosuvastatin 40 MG tablet Commonly known as: CRESTOR Take 40 mg by mouth at bedtime.   tamsulosin 0.4 MG Caps capsule Commonly known as: FLOMAX TAKE 1 CAPSULE BY MOUTH EVERY DAY   triamcinolone cream 0.1 % Commonly known as: KENALOG Apply 1 Application topically daily as needed (tick bite/itching).   triazolam 0.25 MG tablet Commonly known as: HALCION Take 0.25 mg by mouth at bedtime.         ROS:  {Review of Systems:30496}  Blood pressure (!) 143/80, pulse 87, temperature 98 F (36.7 C), temperature source Oral, resp. rate 18, height 5\' 6"  (1.676 m), weight 143 lb (64.9 kg), SpO2 93%. Physical Exam  Results: No results found  for this or any previous visit (from the past 48 hour(s)).  No results found.   Assessment & Plan:  Maurice Orozco is a 77 y.o. male with *** -*** -*** -Follow up ***  All questions were answered to the satisfaction of the patient and family***.  The risk and benefits of *** were discussed including but not limited to ***.  After careful consideration, Maurice Orozco has decided to ***.    Lucretia Roers 06/16/2023, 12:21 PM

## 2023-06-17 ENCOUNTER — Other Ambulatory Visit: Payer: Self-pay

## 2023-06-17 ENCOUNTER — Inpatient Hospital Stay: Payer: Medicare Other

## 2023-06-17 ENCOUNTER — Inpatient Hospital Stay: Payer: Medicare Other | Admitting: Hematology

## 2023-06-17 NOTE — H&P (Signed)
[facility] History and Physical  Reason for Referral: Gallstones, ? Cholecystitis  Referring Physician: Dr. Sherwood Gambler   Chief Complaint   New Patient (Initial Visit)     Maurice Orozco is a 77 y.o. male.  HPI: Maurice Orozco is a 77 yo who has a diagnosis of lung cancer and is on [place] and is in remission per his report.  The patient started having abdominal pain in August as well as UTI.  He was placed on antibiotics for the UTI and says that the pain was mostly in the RUQ. He had a CT scan for possible renal stone but that was negative. DR. Sherwood Gambler then obtained an [place] and this showed concern for thickened wall and stones concerning for cholecystitis.   He says in the last week he has been better. His pain has improved. He had a CT this past week to monitor his metastatic lung cancer.   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 dense RCA calcification --Non-ischemic Myoview   History of kidney stones    in the 70's   History of radiation therapy 07/04/14, 07/06/14, 07/10/14   LLL lung nodule/54 Gy/3 fx- SBRT   Hypercholesterolemia 05/06/2011   Pneumonia due to Streptococcus    HX. POST OPERATIVELY   Port-A-Cath in place 07/21/2022   Pre-diabetes    Stress fx pelvis    DUE TO AA IN 1970    Past Surgical History:  Procedure Laterality Date   BACK SURGERY  07/04/2017   fusion above previous surgery   BACK SURGERY  2011   metal plate W-0,J8   BRONCHIAL BIOPSY  06/03/2022   Procedure: BRONCHIAL BIOPSIES;   Surgeon: Josephine Igo, DO;  Location: [facility];  Service: Pulmonary;;   BRONCHIAL BRUSHINGS  06/03/2022   Procedure: BRONCHIAL BRUSHINGS;  Surgeon: Josephine Igo, DO;  Location: [facility];  Service: Pulmonary;;   BRONCHIAL NEEDLE ASPIRATION BIOPSY  06/03/2022   Procedure: BRONCHIAL NEEDLE ASPIRATION BIOPSIES;  Surgeon: Josephine Igo, DO;  Location: [facility];  Service: Pulmonary;;   Coroanry Calcium Score  03/2017   1043. Noted especially in RCA. Dense calcification, recommend Myoview over Cor CTA.   FIDUCIAL MARKER PLACEMENT  06/03/2022   Procedure: FIDUCIAL MARKER PLACEMENT;  Surgeon: Josephine Igo, DO;  Location: [facility];  Service: Pulmonary;;   IR IMAGING GUIDED PORT INSERTION  08/04/2022   LIPOMA EXCISION N/A 10/14/2017   Procedure: EXCISION LIPOMA ON BACK;  Surgeon: Lucretia Roers, MD;  Location: [facility];  Service: General;  Laterality: N/A;   LIPOMA EXCISION Left 11/19/2018   Procedure: MINOR EXCISION OF SEBACEOUS CYST NECK;  Surgeon: Lucretia Roers, MD;  Location: [facility];  Service: General;  Laterality: Left;  pt knows to arrive at 7:00   LUNG LOBECTOMY  2012   RT UPPER LOBE   NM MYOVIEW LTD  04/2017   EF 60%. Hypertensive response to exercise. (6:21  min, 7.7 METs) No EKG changes. LOW RISK    Family History  Problem Relation Age of Onset   Hypertension Mother    Kidney disease Father    Heart attack Brother    Cancer Maternal Uncle        prostate cancer   Cancer Maternal Uncle        prostate cancer   Cancer Maternal Uncle        prostate cancer   Cancer Maternal Uncle        prostate cancer    Social History   Tobacco Use   Smoking status: Former    Current packs/day: 0.00    Average packs/day: 1.5 packs/day for 59.0 years (88.5 ttl pk-yrs)    Types: Cigarettes    Start date: 09[birth year]09/1950    Quit date: 09/08/2010    Years since quitting: 12.7   Smokeless tobacco: Current    Types: Chew   Tobacco comments:    still chews tobacco, he  tells me he hasn't in the past 2 days.   Vaping Use   Vaping status: Never Used  Substance Use Topics   Alcohol use: No   Drug use: No    Medications: I have reviewed the patient's current medications. Allergies as of 06/16/2023       Reactions   Fentanyl Other (See Comments)   CAUSED HALLUCINATIONS/05-03-13 pt reports it was oral fentanyl that caused hallucinations   Gabapentin Other (See Comments)   Bones throbbed, headaches, weakness   Nabumetone    Unknown reaction    Aspirin Nausea Only        Medication List        Accurate as of June 16, 2023 12:21 PM. If you have any questions, ask your nurse or doctor.          STOP taking these medications    betamethasone dipropionate 0.05 % cream Stopped by: Lucretia Roers   hydrOXYzine 10 MG tablet Commonly known as: ATARAX Stopped by: Lucretia Roers       TAKE these medications    albuterol (2.5 MG/3ML) 0.083% nebulizer solution Commonly known as: PROVENTIL Take 2.5 mg by nebulization every 6 (six) hours as needed for wheezing or shortness of breath.   albuterol 108 (90 Base) MCG/ACT inhaler Commonly known as: VENTOLIN HFA Inhale 2 puffs into the lungs every 4 (four) hours as needed for wheezing or shortness of breath.   cholecalciferol 25 MCG (1000 UNIT) tablet Commonly known as: VITAMIN D3 Take 1,000 Units by mouth daily.   cimetidine 200 MG tablet Commonly known as: TAGAMET Take 400-600 mg by mouth daily as needed (for heartburn).   clobetasol ointment 0.05 % Commonly known as: TEMOVATE Apply topically 2 (two) times daily as needed.   cyanocobalamin 1000 MCG tablet Commonly known as: VITAMIN B12 Take 1,000 mcg by mouth every other day.   ferrous sulfate 325 (65 FE) MG tablet Take 325 mg by mouth daily with breakfast.   Fish Oil 1000 MG Caps Take 1,000 mg by mouth daily.   fluticasone 50 MCG/ACT nasal spray Commonly known as: FLONASE Place 2 sprays into both nostrils daily as needed  for rhinitis.   guaiFENesin 600 MG 12 hr tablet Commonly known as: MUCINEX Take 600 mg by mouth daily.   hydrochlorothiazide 25 MG tablet Commonly known as: HYDRODIURIL Take 12.5 mg by mouth daily.   HYDROcodone-acetaminophen 10-325 MG tablet Commonly known as: NORCO Take 1-2 tablets by mouth every 4 (four) hours as needed  for moderate pain.   hydrOXYzine 25 MG capsule Commonly known as: VISTARIL Take 25-50 mg by mouth at bedtime as needed.   ICY HOT EX Apply 1 Application topically daily as needed (pain).   Klor-Con M20 20 MEQ tablet Generic drug: potassium chloride SA TAKE 1 TABLET BY MOUTH EVERY DAY   lidocaine-prilocaine cream Commonly known as: EMLA Apply a small amount to port a cath site and cover with plastic wrap 1 hour prior to infusion appointments   naproxen sodium 220 MG tablet Commonly known as: ALEVE Take 220 mg by mouth daily as needed (pain).   OVER THE COUNTER MEDICATION Take 1 capsule by mouth daily.   Oxycodone HCl 10 MG Tabs Take 10 mg by mouth 2 (two) times daily as needed (pain).   pantoprazole 40 MG tablet Commonly known as: PROTONIX Take 40 mg by mouth See admin instructions. Take 40 mg daily, may take a second 40 mg dose as needed for heartburn   PEMBROLIZUMAB IV Inject into the vein every 21 ( twenty-one) days.   polyethylene glycol powder 17 GM/SCOOP powder Commonly known as: GLYCOLAX/MIRALAX Take 17 g by mouth daily as needed for mild constipation or moderate constipation.   predniSONE 5 MG tablet Commonly known as: DELTASONE TAKE 1 TABLET BY MOUTH EVERY DAY WITH BREAKFAST   prochlorperazine 10 MG tablet Commonly known as: COMPAZINE Take 1 tablet (10 mg total) by mouth every 6 (six) hours as needed for nausea or vomiting.   rosuvastatin 40 MG tablet Commonly known as: CRESTOR Take 40 mg by mouth at bedtime.   tamsulosin 0.4 MG Caps capsule Commonly known as: FLOMAX TAKE 1 CAPSULE BY MOUTH EVERY DAY   triamcinolone cream  0.1 % Commonly known as: KENALOG Apply 1 Application topically daily as needed (tick bite/itching).   triazolam 0.25 MG tablet Commonly known as: HALCION Take 0.25 mg by mouth at bedtime.         ROS:  A comprehensive review of systems was negative except for: Respiratory: positive for cough, wheezing, and SOB Gastrointestinal: positive for abdominal pain, nausea, reflux symptoms, and vomiting Musculoskeletal: positive for back pain and joint pain  Blood pressure (!) 143/80, pulse 87, temperature 98 F (36.7 C), temperature source Oral, resp. rate 18, height 5\' 6"  (1.676 m), weight 143 lb (64.9 kg), SpO2 93%. Physical Exam Vitals reviewed.  HENT:     Head: Normocephalic.     Nose: Nose normal.  Eyes:     Extraocular Movements: Extraocular movements intact.  Cardiovascular:     Rate and Rhythm: Normal rate and regular rhythm.  Pulmonary:     Effort: Pulmonary effort is normal.     Breath sounds: Normal breath sounds.  Abdominal:     General: There is no distension.     Palpations: Abdomen is soft.     Tenderness: There is abdominal tenderness.  Musculoskeletal:        General: Normal range of motion.     Cervical back: Normal range of motion.  Skin:    General: Skin is warm.  Neurological:     General: No focal deficit present.     Mental Status: He is alert and oriented to person, place, and time.  Psychiatric:        Mood and Affect: Mood normal.        Thought Content: Thought content normal.        Judgment: Judgment normal.     Results: Personally reviewed [place] and recent CT- gallbladder with wall  thickening and stones  CLINICAL DATA:  Right upper quadrant pain   EXAM: ABDOMEN ULTRASOUND COMPLETE   COMPARISON:  Renal stone CT 05/05/2023   FINDINGS: Gallbladder: Tumefactive sludge in the gallbladder lumen. Mild associated gallbladder wall thickening. No pericholecystic fluid. Negative sonographic Murphy's sign.   Common bile duct: Diameter: 8.8 mm  distally.  Prominent.   Liver: Increased hepatic parenchymal echogenicity. No focal lesion. Portal vein is patent on color Doppler imaging with normal direction of blood flow towards the liver.   IVC: No abnormality visualized.   Pancreas: Visualized portion unremarkable.   Spleen: Size and appearance within normal limits.   Right Kidney: Length: 10.2 cm. Echogenicity within normal limits. No mass or hydronephrosis visualized.   Left Kidney: Length: 10.8 cm. Normal renal cortical thickness and echogenicity. Multiple cysts including a 2.1 cm partially exophytic cyst left kidney. No imaging follow-up is needed.   Abdominal aorta: No aneurysm visualized.   Other findings: None.   IMPRESSION: 1. Tumefactive sludge in the gallbladder lumen with mild associated gallbladder wall thickening. No pericholecystic fluid or sonographic Murphy's sign. If there is a high clinical concern for acute cholecystitis, recommend further evaluation with HIDA scan. 2. Dilated common bile duct. Recommend correlation with LFTs. If abnormal, consider further evaluation with MRCP. 3. Increased hepatic parenchymal echogenicity suggestive of steatosis.     Electronically Signed   By: Annia Belt M.D.   On: 05/14/2023 13:23    Latest Reference Range & Units 05/28/23 12:20  Sodium 135 - 145 mmol/L 135  Potassium 3.5 - 5.1 mmol/L 2.9 (L)  Chloride 98 - 111 mmol/L 99  CO2 22 - 32 mmol/L 24  Glucose 70 - 99 mg/dL 563 (H)  BUN 8 - 23 mg/dL 24 (H)  Creatinine 8.75 - 1.24 mg/dL 6.43  Calcium 8.9 - 32.9 mg/dL 8.8 (L)  Anion gap 5 - 15  12  Magnesium 1.7 - 2.4 mg/dL 2.0  Alkaline Phosphatase 38 - 126 U/L 73  Albumin 3.5 - 5.0 g/dL 3.6  AST 15 - 41 U/L 39  ALT 0 - 44 U/L 54 (H)  Total Protein 6.5 - 8.1 g/dL 6.9  Total Bilirubin 0.3 - 1.2 mg/dL 1.0  GFR, Estimated >51 mL/min >60  (L): Data is abnormally low (H): Data is abnormally high  Assessment & Plan:  Maurice Orozco is a 77 y.o. male with stones and what  looks like some degree of cholecystitis. Discussed the Keytrauda dosing with Dr. Ellin Saba. He says it is not a must to hold but will plan to hold off on next weeks dose.   PLAN: I counseled the patient about the indication, risks and benefits of robotic assisted laparoscopic cholecystectomy.  He understands there is a very small chance for bleeding, infection, injury to normal structures (including common bile duct), conversion to open surgery, persistent symptoms, evolution of postcholecystectomy diarrhea, need for secondary interventions, anesthesia reaction, cardiopulmonary issues and other risks not specifically detailed here. I described the expected recovery, the plan for follow-up and the restrictions during the recovery phase.  All questions were answered.   All questions were answered to the satisfaction of the patient and family.   Lucretia Roers 06/16/2023, 12:21 PM

## 2023-06-22 ENCOUNTER — Inpatient Hospital Stay: Payer: Medicare Other

## 2023-06-22 ENCOUNTER — Inpatient Hospital Stay: Payer: Medicare Other | Admitting: Hematology

## 2023-06-23 NOTE — Patient Instructions (Addendum)
Maurice Orozco [facility]  06/23/2023     @PREFPERIOPPHARMACY @   Your procedure is scheduled on  06/25/2023.   Report to [facility] at  0825  A.M.   Call this number if you have problems the morning of surgery:  [phone]  If you experience any cold or flu symptoms such as cough, fever, chills, shortness of breath, etc. between now and your scheduled surgery, please notify us at the above number.   Remember:  Do not eat after midnight.    You may drink clear liquids until 0625 am on 06/25/2023.     Clear liquids allowed are:                    Water, Juice (No red color; non-citric and without pulp; diabetics please choose diet or no sugar options), Carbonated beverages (diabetics please choose diet or no sugar options), Clear Tea (No creamer, milk, or cream, including half & half and powdered creamer), Black Coffee Only (No creamer, milk or cream, including half & half and powdered creamer), and Clear Sports drink (No red color; diabetics please choose diet or no sugar options)    Take these medicines the morning of surgery with A SIP OF WATER       zyrtec, tagamet, hydrocodone(if needed), hydroxyzine, pantoprazole, prednisone, compazine (if needed), flomax.     Do not wear jewelry, make-up or nail polish, including gel polish,  artificial nails, or any other type of covering on natural nails (fingers and  toes).  Do not wear lotions, powders, or perfumes, or deodorant.  Do not shave 48 hours prior to surgery.  Men may shave face and neck.  Do not bring valuables to the hospital.  [facility] is not responsible for any belongings or valuables.  Contacts, dentures or bridgework may not be worn into surgery.  Leave your suitcase in the car.  After surgery it may be brought to your room.  For patients admitted to the hospital, discharge time will be determined by your treatment team.  Patients discharged the day of surgery will not be allowed to drive home and must have  someone with them for 24 hours.     Special instructions:   DO NOT smoke tobacco or vape for 24 hors before your procedure.  Please read over the following fact sheets that you were given. Anesthesia Post-op Instructions and Care and Recovery After Surgery       Minimally Invasive Cholecystectomy, Care After The following information offers guidance on how to care for yourself after your procedure. Your health care provider may also give you more specific instructions. If you have problems or questions, contact your health care provider. What can I expect after the procedure? After the procedure, it is common to have: Pain at your incision sites. You will be given medicines to control this pain. Mild nausea or vomiting. Bloating and possible shoulder pain from the gas that was used during the procedure. Follow these instructions at home: Medicines Take over-the-counter and prescription medicines only as told by your health care provider. If you were prescribed an antibiotic medicine, take it as told by your health care provider. Do not stop using the antibiotic even if you start to feel better. Ask your health care provider if the medicine prescribed to you: Requires you to avoid driving or using machinery. Can cause constipation. You may need to take these actions to prevent or treat constipation: Drink enough fluid  to keep your urine pale yellow. Take over-the-counter or prescription medicines. Eat foods that are high in fiber, such as beans, whole grains, and fresh fruits and vegetables. Limit foods that are high in fat and processed sugars, such as fried or sweet foods. Incision care  Follow instructions from your health care provider about how to take care of your incisions. Make sure you: Wash your hands with soap and water for at least 20 seconds before and after you change your bandage (dressing). If soap and water are not available, use hand sanitizer. Change your dressing  as told by your health care provider. Leave stitches (sutures), skin glue, or adhesive strips in place. These skin closures may need to be in place for 2 weeks or longer. If adhesive strip edges start to loosen and curl up, you may trim the loose edges. Do not remove adhesive strips completely unless your health care provider tells you to do that. Do not take baths, swim, or use a hot tub until your health care provider approves. Ask your health care provider if you may take showers. You may only be allowed to take sponge baths. Check your incision area every day for signs of infection. Check for: More redness, swelling, or pain. Fluid or blood. Warmth. Pus or a bad smell. Activity Rest as told by your health care provider. Do not do activities that require a lot of effort. Avoid sitting for a long time without moving. Get up to take short walks every 1-2 hours. This is important to improve blood flow and breathing. Ask for help if you feel weak or unsteady. Do not lift anything that is heavier than 10 lb (4.5 kg), or the limit that you are told, until your health care provider says that it is safe. Do not play contact sports until your health care provider approves. Do not return to work or school until your health care provider approves. Return to your normal activities as told by your health care provider. Ask your health care provider what activities are safe for you. General instructions If you were given a sedative during the procedure, it can affect you for several hours. Do not drive or operate machinery until your health care provider says that it is safe. Keep all follow-up visits. This is important. Contact a health care provider if: You develop a rash. You have more redness, swelling, or pain around your incisions. You have fluid or blood coming from your incisions. Your incisions feel warm to the touch. You have pus or a bad smell coming from your incisions. You have a  fever. One or more of your incisions breaks open. Get help right away if: You have trouble breathing. You have chest pain. You have more pain in your shoulders. You faint or feel dizzy when you stand. You have severe pain in your abdomen. You have nausea or vomiting that lasts for more than one day. You have leg pain that is new or unusual, or if it is localized to one specific spot. These symptoms may represent a serious problem that is an emergency. Do not wait to see if the symptoms will go away. Get medical help right away. Call your local emergency services (911 in the [place] Do not drive yourself to the hospital. Summary After your procedure, it is common to have pain at the incision sites. You may also have nausea or bloating. Follow your health care provider's instructions about medicine, activity restrictions, and caring for your incision areas. Do  not do activities that require a lot of effort. Contact a health care provider if you have a fever or other signs of infection, such as more redness, swelling, or pain around the incisions. Get help right away if you have chest pain, increasing pain in the shoulders, or trouble breathing. This information is not intended to replace advice given to you by your health care provider. Make sure you discuss any questions you have with your health care provider. Document Revised: 02/26/2021 Document Reviewed: 02/26/2021 Elsevier Patient Education  2024 [employer] General Anesthesia, Adult, Care After The following information offers guidance on how to care for yourself after your procedure. Your health care provider may also give you more specific instructions. If you have problems or questions, contact your health care provider. What can I expect after the procedure? After the procedure, it is common for people to: Have pain or discomfort at the IV site. Have nausea or vomiting. Have a sore throat or hoarseness. Have trouble  concentrating. Feel cold or chills. Feel weak, sleepy, or tired (fatigue). Have soreness and body aches. These can affect parts of the body that were not involved in surgery. Follow these instructions at home: For the time period you were told by your health care provider:  Rest. Do not participate in activities where you could fall or become injured. Do not drive or use machinery. Do not drink alcohol. Do not take sleeping pills or medicines that cause drowsiness. Do not make important decisions or sign legal documents. Do not take care of children on your own. General instructions Drink enough fluid to keep your urine pale yellow. If you have sleep apnea, surgery and certain medicines can increase your risk for breathing problems. Follow instructions from your health care provider about wearing your sleep device: Anytime you are sleeping, including during daytime naps. While taking prescription pain medicines, sleeping medicines, or medicines that make you drowsy. Return to your normal activities as told by your health care provider. Ask your health care provider what activities are safe for you. Take over-the-counter and prescription medicines only as told by your health care provider. Do not use any products that contain nicotine or tobacco. These products include cigarettes, chewing tobacco, and vaping devices, such as e-cigarettes. These can delay incision healing after surgery. If you need help quitting, ask your health care provider. Contact a health care provider if: You have nausea or vomiting that does not get better with medicine. You vomit every time you eat or drink. You have pain that does not get better with medicine. You cannot urinate or have bloody urine. You develop a skin rash. You have a fever. Get help right away if: You have trouble breathing. You have chest pain. You vomit blood. These symptoms may be an emergency. Get help right away. Call 911. Do not wait  to see if the symptoms will go away. Do not drive yourself to the hospital. Summary After the procedure, it is common to have a sore throat, hoarseness, nausea, vomiting, or to feel weak, sleepy, or fatigue. For the time period you were told by your health care provider, do not drive or use machinery. Get help right away if you have difficulty breathing, have chest pain, or vomit blood. These symptoms may be an emergency. This information is not intended to replace advice given to you by your health care provider. Make sure you discuss any questions you have with your health care provider. Document Revised: 11/22/2021 Document Reviewed: 11/22/2021 Elsevier  Patient Education  2024 [employer]. How to Use Chlorhexidine Before Surgery Chlorhexidine gluconate (CHG) is a germ-killing (antiseptic) solution that is used to clean the skin. It can get rid of the bacteria that normally live on the skin and can keep them away for about 24 hours. To clean your skin with CHG, you may be given: A CHG solution to use in the shower or as part of a sponge bath. A prepackaged cloth that contains CHG. Cleaning your skin with CHG may help lower the risk for infection: While you are staying in the intensive care unit of the hospital. If you have a vascular access, such as a central line, to provide short-term or long-term access to your veins. If you have a catheter to drain urine from your bladder. If you are on a ventilator. A ventilator is a machine that helps you breathe by moving air in and out of your lungs. After surgery. What are the risks? Risks of using CHG include: A skin reaction. Hearing loss, if CHG gets in your ears and you have a perforated eardrum. Eye injury, if CHG gets in your eyes and is not rinsed out. The CHG product catching fire. Make sure that you avoid smoking and flames after applying CHG to your skin. Do not use CHG: If you have a chlorhexidine allergy or have previously reacted  to chlorhexidine. On babies younger than 41 months of age. How to use CHG solution Use CHG only as told by your health care provider, and follow the instructions on the label. Use the full amount of CHG as directed. Usually, this is one bottle. During a shower Follow these steps when using CHG solution during a shower (unless your health care provider gives you different instructions): Start the shower. Use your normal soap and shampoo to wash your face and hair. Turn off the shower or move out of the shower stream. Pour the CHG onto a clean washcloth. Do not use any type of brush or rough-edged sponge. Starting at your neck, lather your body down to your toes. Make sure you follow these instructions: If you will be having surgery, pay special attention to the part of your body where you will be having surgery. Scrub this area for at least 1 minute. Do not use CHG on your head or face. If the solution gets into your ears or eyes, rinse them well with water. Avoid your genital area. Avoid any areas of skin that have broken skin, cuts, or scrapes. Scrub your back and under your arms. Make sure to wash skin folds. Let the lather sit on your skin for 1-2 minutes or as long as told by your health care provider. Thoroughly rinse your entire body in the shower. Make sure that all body creases and crevices are rinsed well. Dry off with a clean towel. Do not put any substances on your body afterward--such as powder, lotion, or perfume--unless you are told to do so by your health care provider. Only use lotions that are recommended by the manufacturer. Put on clean clothes or pajamas. If it is the night before your surgery, sleep in clean sheets.  During a sponge bath Follow these steps when using CHG solution during a sponge bath (unless your health care provider gives you different instructions): Use your normal soap and shampoo to wash your face and hair. Pour the CHG onto a clean  washcloth. Starting at your neck, lather your body down to your toes. Make sure you follow these  instructions: If you will be having surgery, pay special attention to the part of your body where you will be having surgery. Scrub this area for at least 1 minute. Do not use CHG on your head or face. If the solution gets into your ears or eyes, rinse them well with water. Avoid your genital area. Avoid any areas of skin that have broken skin, cuts, or scrapes. Scrub your back and under your arms. Make sure to wash skin folds. Let the lather sit on your skin for 1-2 minutes or as long as told by your health care provider. Using a different clean, wet washcloth, thoroughly rinse your entire body. Make sure that all body creases and crevices are rinsed well. Dry off with a clean towel. Do not put any substances on your body afterward--such as powder, lotion, or perfume--unless you are told to do so by your health care provider. Only use lotions that are recommended by the manufacturer. Put on clean clothes or pajamas. If it is the night before your surgery, sleep in clean sheets. How to use CHG prepackaged cloths Only use CHG cloths as told by your health care provider, and follow the instructions on the label. Use the CHG cloth on clean, dry skin. Do not use the CHG cloth on your head or face unless your health care provider tells you to. When washing with the CHG cloth: Avoid your genital area. Avoid any areas of skin that have broken skin, cuts, or scrapes. Before surgery Follow these steps when using a CHG cloth to clean before surgery (unless your health care provider gives you different instructions): Using the CHG cloth, vigorously scrub the part of your body where you will be having surgery. Scrub using a back-and-forth motion for 3 minutes. The area on your body should be completely wet with CHG when you are done scrubbing. Do not rinse. Discard the cloth and let the area air-dry. Do not put  any substances on the area afterward, such as powder, lotion, or perfume. Put on clean clothes or pajamas. If it is the night before your surgery, sleep in clean sheets.  For general bathing Follow these steps when using CHG cloths for general bathing (unless your health care provider gives you different instructions). Use a separate CHG cloth for each area of your body. Make sure you wash between any folds of skin and between your fingers and toes. Wash your body in the following order, switching to a new cloth after each step: The front of your neck, shoulders, and chest. Both of your arms, under your arms, and your hands. Your stomach and groin area, avoiding the genitals. Your right leg and foot. Your left leg and foot. The back of your neck, your back, and your buttocks. Do not rinse. Discard the cloth and let the area air-dry. Do not put any substances on your body afterward--such as powder, lotion, or perfume--unless you are told to do so by your health care provider. Only use lotions that are recommended by the manufacturer. Put on clean clothes or pajamas. Contact a health care provider if: Your skin gets irritated after scrubbing. You have questions about using your solution or cloth. You swallow any chlorhexidine. Call your local poison control center (317-327-9033 in the [place] Get help right away if: Your eyes itch badly, or they become very red or swollen. Your skin itches badly and is red or swollen. Your hearing changes. You have trouble seeing. You have swelling or tingling in your mouth  or throat. You have trouble breathing. These symptoms may represent a serious problem that is an emergency. Do not wait to see if the symptoms will go away. Get medical help right away. Call your local emergency services (911 in the [place] Do not drive yourself to the hospital. Summary Chlorhexidine gluconate (CHG) is a germ-killing (antiseptic) solution that is used to clean the skin.  Cleaning your skin with CHG may help to lower your risk for infection. You may be given CHG to use for bathing. It may be in a bottle or in a prepackaged cloth to use on your skin. Carefully follow your health care provider's instructions and the instructions on the product label. Do not use CHG if you have a chlorhexidine allergy. Contact your health care provider if your skin gets irritated after scrubbing. This information is not intended to replace advice given to you by your health care provider. Make sure you discuss any questions you have with your health care provider. Document Revised: 12/23/2021 Document Reviewed: 11/05/2020 Elsevier Patient Education  2023 [employer].

## 2023-06-24 ENCOUNTER — Encounter (HOSPITAL_COMMUNITY)
Admission: RE | Admit: 2023-06-24 | Discharge: 2023-06-24 | Disposition: A | Discharge status: Home / Self Care | Payer: Medicare Other | Source: Ambulatory Visit | Attending: General Surgery | Admitting: General Surgery

## 2023-06-24 ENCOUNTER — Encounter (HOSPITAL_COMMUNITY): Payer: Self-pay

## 2023-06-24 VITALS — BP 144/85 | HR 99 | Temp 98.6°F | Resp 16 | Ht 66.0 in | Wt 143.0 lb

## 2023-06-24 DIAGNOSIS — Z01818 Encounter for other preprocedural examination: Secondary | ICD-10-CM | POA: Diagnosis not present

## 2023-06-24 DIAGNOSIS — E876 Hypokalemia: Secondary | ICD-10-CM | POA: Insufficient documentation

## 2023-06-24 DIAGNOSIS — I10 Essential (primary) hypertension: Secondary | ICD-10-CM | POA: Insufficient documentation

## 2023-06-24 LAB — BASIC METABOLIC PANEL
Anion gap: 11 (ref 5–15)
BUN: 14 mg/dL (ref 8–23)
CO2: 25 mmol/L (ref 22–32)
Calcium: 9.4 mg/dL (ref 8.9–10.3)
Chloride: 100 mmol/L (ref 98–111)
Creatinine, Ser: 0.93 mg/dL (ref 0.61–1.24)
GFR, Estimated: >60 mL/min (ref >60)
Glucose, Bld: 121 mg/dL — ABNORMAL HIGH (ref 70–99)
Potassium: 3.6 mmol/L (ref 3.5–5.1)
Sodium: 136 mmol/L (ref 135–145)

## 2023-06-25 ENCOUNTER — Ambulatory Visit (HOSPITAL_BASED_OUTPATIENT_CLINIC_OR_DEPARTMENT_OTHER): Payer: Medicare Other | Admitting: Anesthesiology

## 2023-06-25 ENCOUNTER — Encounter (HOSPITAL_COMMUNITY)
Admission: RE | Disposition: A | Discharge status: Home / Self Care | Payer: Self-pay | Source: Home / Self Care | Attending: General Surgery

## 2023-06-25 ENCOUNTER — Ambulatory Visit (HOSPITAL_COMMUNITY): Payer: Medicare Other | Admitting: Anesthesiology

## 2023-06-25 ENCOUNTER — Ambulatory Visit (HOSPITAL_COMMUNITY)
Admission: RE | Admit: 2023-06-25 | Discharge: 2023-06-25 | Disposition: A | Discharge status: Home / Self Care | Payer: Medicare Other | Attending: General Surgery | Admitting: General Surgery

## 2023-06-25 ENCOUNTER — Encounter (HOSPITAL_COMMUNITY): Payer: Self-pay | Admitting: General Surgery

## 2023-06-25 DIAGNOSIS — D649 Anemia, unspecified: Secondary | ICD-10-CM | POA: Diagnosis not present

## 2023-06-25 DIAGNOSIS — C3490 Malignant neoplasm of unspecified part of unspecified bronchus or lung: Secondary | ICD-10-CM | POA: Insufficient documentation

## 2023-06-25 DIAGNOSIS — K8010 Calculus of gallbladder with chronic cholecystitis without obstruction: Secondary | ICD-10-CM

## 2023-06-25 DIAGNOSIS — F419 Anxiety disorder, unspecified: Secondary | ICD-10-CM | POA: Insufficient documentation

## 2023-06-25 DIAGNOSIS — Z79899 Other long term (current) drug therapy: Secondary | ICD-10-CM | POA: Insufficient documentation

## 2023-06-25 DIAGNOSIS — Z87440 Personal history of urinary (tract) infections: Secondary | ICD-10-CM | POA: Insufficient documentation

## 2023-06-25 DIAGNOSIS — K219 Gastro-esophageal reflux disease without esophagitis: Secondary | ICD-10-CM | POA: Diagnosis not present

## 2023-06-25 DIAGNOSIS — Z87891 Personal history of nicotine dependence: Secondary | ICD-10-CM | POA: Diagnosis not present

## 2023-06-25 DIAGNOSIS — J449 Chronic obstructive pulmonary disease, unspecified: Secondary | ICD-10-CM | POA: Diagnosis not present

## 2023-06-25 DIAGNOSIS — I10 Essential (primary) hypertension: Secondary | ICD-10-CM | POA: Diagnosis not present

## 2023-06-25 DIAGNOSIS — I2510 Atherosclerotic heart disease of native coronary artery without angina pectoris: Secondary | ICD-10-CM | POA: Diagnosis not present

## 2023-06-25 DIAGNOSIS — K828 Other specified diseases of gallbladder: Secondary | ICD-10-CM | POA: Insufficient documentation

## 2023-06-25 SURGERY — CHOLECYSTECTOMY, ROBOT-ASSISTED, LAPAROSCOPIC
Anesthesia: General | Site: Abdomen

## 2023-06-25 MED ORDER — ONDANSETRON HCL 4 MG PO TABS: 4.0000 mg | ORAL_TABLET | Freq: Three times a day (TID) | ORAL | 1 refills | Status: AC | PRN

## 2023-06-25 MED ORDER — KETOROLAC TROMETHAMINE 15 MG/ML IJ SOLN
15.0000 mg | Freq: Once | INTRAMUSCULAR | Status: AC
  Administered 2023-06-25: 15 mg via INTRAVENOUS

## 2023-06-25 MED ORDER — ROCURONIUM 10MG/ML (10ML) SYRINGE FOR MEDFUSION PUMP - OPTIME
INTRAVENOUS | Status: DC | PRN
  Administered 2023-06-25: 50 mg via INTRAVENOUS

## 2023-06-25 MED ORDER — FENTANYL CITRATE (PF) 100 MCG/2ML IJ SOLN
INTRAMUSCULAR | Status: DC | PRN
  Administered 2023-06-25: 50 ug via INTRAVENOUS

## 2023-06-25 MED ORDER — KETOROLAC TROMETHAMINE 15 MG/ML IJ SOLN
INTRAMUSCULAR | Status: AC
  Filled 2023-06-25: qty 1

## 2023-06-25 MED ORDER — CHLORHEXIDINE GLUCONATE 0.12 % MT SOLN: 15.0000 mL | Freq: Once | OROMUCOSAL | Status: AC

## 2023-06-25 MED ORDER — OXYCODONE HCL 5 MG PO TABS
5.0000 mg | ORAL_TABLET | ORAL | 0 refills | Status: DC | PRN
Start: 2023-06-25 — End: 2024-04-05

## 2023-06-25 MED ORDER — MIDAZOLAM HCL 5 MG/5ML IJ SOLN
INTRAMUSCULAR | Status: DC | PRN
  Administered 2023-06-25: 2 mg via INTRAVENOUS

## 2023-06-25 MED ORDER — LIDOCAINE HCL (PF) 2 % IJ SOLN
INTRAMUSCULAR | Status: AC
  Filled 2023-06-25: qty 5

## 2023-06-25 MED ORDER — SODIUM CHLORIDE 0.9 % IV SOLN
INTRAVENOUS | Status: AC
  Filled 2023-06-25: qty 2

## 2023-06-25 MED ORDER — CHLORHEXIDINE GLUCONATE CLOTH 2 % EX PADS: 6.0000 | MEDICATED_PAD | Freq: Once | CUTANEOUS | Status: DC

## 2023-06-25 MED ORDER — HYDROMORPHONE HCL 1 MG/ML IJ SOLN
INTRAMUSCULAR | Status: AC
  Filled 2023-06-25: qty 0.5

## 2023-06-25 MED ORDER — HYDROMORPHONE HCL 1 MG/ML IJ SOLN
0.2500 mg | INTRAMUSCULAR | Status: DC | PRN
  Administered 2023-06-25: 0.5 mg via INTRAVENOUS
  Filled 2023-06-25: qty 0.5

## 2023-06-25 MED ORDER — BUPIVACAINE HCL (PF) 0.5 % IJ SOLN
INTRAMUSCULAR | Status: AC
  Filled 2023-06-25: qty 30

## 2023-06-25 MED ORDER — STERILE WATER FOR IRRIGATION IR SOLN
Status: DC | PRN
  Administered 2023-06-25: 500 mL

## 2023-06-25 MED ORDER — FENTANYL CITRATE (PF) 100 MCG/2ML IJ SOLN
INTRAMUSCULAR | Status: AC
  Filled 2023-06-25: qty 2

## 2023-06-25 MED ORDER — SUGAMMADEX SODIUM 200 MG/2ML IV SOLN
INTRAVENOUS | Status: DC | PRN
  Administered 2023-06-25: 200 mg via INTRAVENOUS

## 2023-06-25 MED ORDER — CHLORHEXIDINE GLUCONATE 0.12 % MT SOLN
OROMUCOSAL | Status: AC
  Administered 2023-06-25: 15 mL via OROMUCOSAL
  Filled 2023-06-25: qty 15

## 2023-06-25 MED ORDER — HYDROMORPHONE HCL 1 MG/ML IJ SOLN
INTRAMUSCULAR | Status: DC | PRN
  Administered 2023-06-25 (×2): .25 mg via INTRAVENOUS
  Administered 2023-06-25: 0.5 mg via INTRAVENOUS

## 2023-06-25 MED ORDER — ROCURONIUM BROMIDE 10 MG/ML (PF) SYRINGE
PREFILLED_SYRINGE | INTRAVENOUS | Status: AC
  Filled 2023-06-25: qty 10

## 2023-06-25 MED ORDER — LIDOCAINE HCL (CARDIAC) PF 50 MG/5ML IV SOSY
PREFILLED_SYRINGE | INTRAVENOUS | Status: DC | PRN
  Administered 2023-06-25: 80 mg via INTRAVENOUS

## 2023-06-25 MED ORDER — SODIUM CHLORIDE 0.9 % IV SOLN
2.0000 g | INTRAVENOUS | Status: AC
  Administered 2023-06-25: 2 g via INTRAVENOUS

## 2023-06-25 MED ORDER — PROPOFOL 10 MG/ML IV BOLUS
INTRAVENOUS | Status: DC | PRN
  Administered 2023-06-25: 180 mg via INTRAVENOUS

## 2023-06-25 MED ORDER — LACTATED RINGERS IV SOLN: INTRAVENOUS | Status: DC

## 2023-06-25 MED ORDER — BUPIVACAINE HCL (PF) 0.5 % IJ SOLN
INTRAMUSCULAR | Status: DC | PRN
  Administered 2023-06-25: 30 mL

## 2023-06-25 MED ORDER — ONDANSETRON HCL 4 MG/2ML IJ SOLN
INTRAMUSCULAR | Status: AC
  Filled 2023-06-25: qty 2

## 2023-06-25 MED ORDER — PROPOFOL 10 MG/ML IV BOLUS
INTRAVENOUS | Status: AC
  Filled 2023-06-25: qty 20

## 2023-06-25 MED ORDER — ORAL CARE MOUTH RINSE: 15.0000 mL | Freq: Once | OROMUCOSAL | Status: AC

## 2023-06-25 SURGICAL SUPPLY — 47 items
ADH SKN CLS APL DERMABOND .7 (GAUZE/BANDAGES/DRESSINGS) ×1
APL PRP STRL LF DISP 70% ISPRP (MISCELLANEOUS) ×1
BLADE SURG 15 STRL LF DISP TIS (BLADE) ×1 IMPLANT
BLADE SURG 15 STRL SS (BLADE) ×1
CAUTERY HOOK MNPLR 1.6 DVNC XI (INSTRUMENTS) ×1 IMPLANT
CHLORAPREP W/TINT 26 (MISCELLANEOUS) ×1 IMPLANT
CLIP LIGATING HEM O LOK PURPLE (MISCELLANEOUS) ×1 IMPLANT
COVER LIGHT HANDLE STERIS (MISCELLANEOUS) ×2 IMPLANT
COVER TIP SHEARS 8 DVNC (MISCELLANEOUS) IMPLANT
DEFOGGER SCOPE WARMER CLEARIFY (MISCELLANEOUS) IMPLANT
DERMABOND ADVANCED .7 DNX12 (GAUZE/BANDAGES/DRESSINGS) ×1 IMPLANT
DRAPE ARM DVNC X/XI (DISPOSABLE) ×4 IMPLANT
DRAPE COLUMN DVNC XI (DISPOSABLE) ×1 IMPLANT
ELECT REM PT RETURN 9FT ADLT (ELECTROSURGICAL) ×1
ELECTRODE REM PT RTRN 9FT ADLT (ELECTROSURGICAL) ×1 IMPLANT
FORCEPS BPLR R/ABLATION 8 DVNC (INSTRUMENTS) ×1 IMPLANT
FORCEPS PROGRASP DVNC XI (FORCEP) ×1 IMPLANT
GLOVE BIO SURGEON STRL SZ 6.5 (GLOVE) ×2 IMPLANT
GLOVE BIOGEL PI IND STRL 6.5 (GLOVE) ×2 IMPLANT
GLOVE BIOGEL PI IND STRL 7.0 (GLOVE) ×2 IMPLANT
GOWN STRL REUS W/TWL LRG LVL3 (GOWN DISPOSABLE) ×4 IMPLANT
GRASPER SUT TROCAR 14GX15 (MISCELLANEOUS) IMPLANT
IRRIGATOR SUCT 8 DISP DVNC XI (IRRIGATION / IRRIGATOR) IMPLANT
IV NS IRRIG 3000ML ARTHROMATIC (IV SOLUTION) ×1 IMPLANT
KIT TURNOVER KIT A (KITS) ×1 IMPLANT
L-HOOK LAP DISP 36CM (ELECTROSURGICAL) ×1
LHOOK LAP DISP 36CM (ELECTROSURGICAL) IMPLANT
NDL HYPO 21X1.5 SAFETY (NEEDLE) ×1 IMPLANT
NDL INSUFFLATION 14GA 120MM (NEEDLE) ×1 IMPLANT
NEEDLE HYPO 21X1.5 SAFETY (NEEDLE) ×1
NEEDLE INSUFFLATION 14GA 120MM (NEEDLE) ×1
OBTURATOR OPTICAL STND 8 DVNC (TROCAR) ×1
OBTURATOR OPTICALSTD 8 DVNC (TROCAR) ×1 IMPLANT
PACK LAP CHOLE LZT030E (CUSTOM PROCEDURE TRAY) ×1 IMPLANT
PAD ARMBOARD 7.5X6 YLW CONV (MISCELLANEOUS) ×1 IMPLANT
PENCIL HANDSWITCHING (ELECTRODE) ×1 IMPLANT
POSITIONER HEAD 8X9X4 ADT (SOFTGOODS) ×1 IMPLANT
SEAL UNIV 5-12 XI (MISCELLANEOUS) ×4 IMPLANT
SET BASIN LINEN APH (SET/KITS/TRAYS/PACK) ×1 IMPLANT
SET TUBE DA VINCI INSUFFLATOR (TUBING) IMPLANT
SHEARS HARMONIC 36 ACE (MISCELLANEOUS) IMPLANT
SUT MNCRL AB 4-0 PS2 18 (SUTURE) ×2 IMPLANT
SUT VICRYL 0 AB UR-6 (SUTURE) IMPLANT
SYR 30ML LL (SYRINGE) ×1 IMPLANT
SYS RETRIEVAL 5MM INZII UNIV (BASKET) ×1
SYSTEM RETRIEVL 5MM INZII UNIV (BASKET) ×1 IMPLANT
WATER STERILE IRR 500ML POUR (IV SOLUTION) ×1 IMPLANT

## 2023-06-25 NOTE — Anesthesia Procedure Notes (Signed)
Procedure Name: Intubation Date/Time: 06/25/2023 12:03 PM  Performed by: Moshe Salisbury, CRNAPre-anesthesia Checklist: Patient identified, Patient being monitored, Timeout performed, Emergency Drugs available and Suction available Patient Re-evaluated:Patient Re-evaluated prior to induction Oxygen Delivery Method: Circle system utilized Preoxygenation: Pre-oxygenation with 100% oxygen Induction Type: IV induction Ventilation: Mask ventilation without difficulty Laryngoscope Size: Mac and 3 Grade View: Grade I Tube type: Oral Tube size: 7.5 mm Number of attempts: 1 Airway Equipment and Method: Stylet Placement Confirmation: ETT inserted through vocal cords under direct vision, positive ETCO2 and breath sounds checked- equal and bilateral Secured at: 21 cm Tube secured with: Tape Dental Injury: Teeth and Oropharynx as per pre-operative assessment

## 2023-06-25 NOTE — Progress Notes (Signed)
[facility]  Updated wife. Rx sent in for Roxicodone 5 mg q 4 PRN # 10. He says he is out of his Norco because of his pain. We will let Dr. Sharyon Medicus office know.   Phone call in 2 weeks.   Maurice Greenhouse, MD [facility] [address] [place]ella [place]aring [place], [place] [address] [phone] (office)

## 2023-06-25 NOTE — Op Note (Signed)
[facility] Operative Note  06/25/23  Preoperative Diagnosis: Chronic cholecystitis, cholelithiasis    Postoperative Diagnosis: Same   Procedure(s) Performed: Robotic Assisted Laparoscopic Cholecystectomy   Surgeon: Lillia Abed C. Henreitta Leber, MD   Assistants: No qualified resident was available    Anesthesia: General endotracheal   Anesthesiologist: Windell Norfolk, MD    Specimens: Gallbladder   Estimated Blood Loss: Minimal   Blood Replacement: None    Complications: None   Wound Class: Contaminated    Operative Indications: The patient was found to have stones and concern for cholecystitis on imaging and was symptomatic.  We discussed the risk of the procedure including but not limited to bleeding, infection, injury to the common bile duct, bile leak, need for further procedures, chance of subtotal cholecystectomy. Due to ICG shortage, it was not  given before the case.   Findings:  Edematous and hyperemic, concerning for cholecystitis  Critical view of safety noted All clips intact at the end of the case Adequate hemostasis   Procedure: Firefly was given in the preoperative area. The patient was taken to the operating room and placed supine. General endotracheal anesthesia was induced. Intravenous antibiotics were administered per protocol.  An orogastric tube positioned to decompress the stomach. The abdomen was prepared and draped in the usual sterile fashion.   Veress needle was placed at the supraumbilical area and insufflation was started after confirming a positive saline drop test and no immediate increase in abdominal pressure.  After reaching 15 mm, the Veress needle was removed and a 8 mm port was placed via optiview technique umbilical, measuring 20 mm away from the suspected position of the gallbladder. There was omentum adherent to the anterior abdominal wall adherent to an old umbilical mesh. The abdomen was inspected and no abnormalities or  injuries were found.  Under direct vision, ports were placed in the following locations in a semi curvilinear position around the target of the gallbladder: Two 8 mm ports on the patient's right each having 8cm clearance to the adjacent ports. I then used these ports and the umbilical port and dissected down the omentum from the anterior abdominal wall with a Harmonic Scalpel.  I then was able to free this up enough to get the left sided port in. One 8 mm port placed on the patient's left 8 cm from the umbilical port. Once ports were placed, the table was placed in the reverse Trendelenburg position with the right side up. The Xi platform was brought into the operative field and docked to the ports successfully.  An endoscope was placed through the umbilical port, prograsper through the most lateral right port, forced bipolar to the port just right of the umbilicus, and then a hook cautery in the left port.  The dome of the gallbladder was grasped with prograsp and retracted over the dome of the liver. Adhesions between the gallbladder and omentum, duodenum and transverse colon were lysed via hook cautery. The infundibulum was grasped with the forced bipolar and retracted toward the right lower quadrant. This maneuver exposed Calot's triangle. Te peritoneum overlying the gallbladder infundibulum was then dissected and the cystic duct and cystic artery identified.  Critical view of safety with the liver bed clearly visible behind the duct and artery with no additional structures noted.  The cystic duct and cystic artery were doubly clipped and divided close to the gallbladder.    The gallbladder was then dissected from its peritoneal and liver bed attachments by electrocautery. There was some spillage  of bile. Hemostasis was checked prior to removing the hook cautery.  A 5mm Endo Catch bag was then placed through the left side port. The [employer] was undocked and moved out of the field, and the gallbladder was removed  in the bag.  The gallbladder was passed off the table as a specimen.  Bile was suctioned out of the abdomen. There was no evidence of bleeding from the gallbladder fossa or cystic artery or leakage of the bile from the cystic duct stump. The left port site closed with a 0 vicryl and PMI due to dilation from removing the gallbladder.The abdomen was desufflated and secondary trocars were removed under direct vision.   No bleeding was noted. All skin incisions were closed with subcuticular sutures of 4-0 monocryl and dermabond.   Final inspection revealed acceptable hemostasis. All counts were correct at the end of the case. The patient was awakened from anesthesia and extubated without complication. The OG tube was removed.  The patient went to the PACU in stable condition.   Algis Greenhouse, MD [facility] [address] [place], [place] [address] [phone] (office)

## 2023-06-25 NOTE — Discharge Instructions (Signed)
Discharge Robotic Assisted Laparoscopic Surgery Instructions:  Common Complaints: Right shoulder pain is common after laparoscopic surgery. This is secondary to the gas used in the surgery being trapped under the diaphragm.  Walk to help your body absorb the gas. This will improve in a few days. Pain at the port sites are common, especially the larger port sites. This will improve with time.  Some nausea is common and poor appetite. The main goal is to stay hydrated the first few days after surgery.   Diet/ Activity: Diet as tolerated. You may not have an appetite, but it is important to stay hydrated. Drink 64 ounces of water a day. Your appetite will return with time.  Shower per your regular routine daily.  Do not take hot showers. Take warm showers that are less than 10 minutes. Rest and listen to your body, but do not remain in bed all day.  Walk everyday for at least 15-20 minutes. Deep cough and move around every 1-2 hours in the first few days after surgery.  Do not lift > 10 lbs, perform excessive bending, pushing, pulling, squatting for 1-2 weeks after surgery.  Do not pick at the dermabond glue on your incision sites.  This glue film will remain in place for 1-2 weeks and will start to peel off.  Do not place lotions or balms on your incision unless instructed to specifically by Dr. Henreitta Leber.   Pain Expectations and Narcotics: -After surgery you will have pain associated with your incisions and this is normal. The pain is muscular and nerve pain, and will get better with time. -You are encouraged and expected to take non narcotic medications like tylenol and ibuprofen (when able) to treat pain as multiple modalities can aid with pain treatment. -Narcotics are only used when pain is severe or there is breakthrough pain. -You are not expected to have a pain score of 0 after surgery, as we cannot prevent pain. A pain score of 3-4 that allows you to be functional, move, walk, and tolerate  some activity is the goal. The pain will continue to improve over the days after surgery and is dependent on your surgery. -Due to [place] law, we are only able to give a certain amount of pain medication to treat post operative pain, and we only give additional narcotics on a patient by patient basis.  -For most laparoscopic surgery, studies have shown that the majority of patients only need 10-15 narcotic pills, and for open surgeries most patients only need 15-20.   -Having appropriate expectations of pain and knowledge of pain management with non narcotics is important as we do not want anyone to become addicted to narcotic pain medication.  -Using ice packs in the first 48 hours and heating pads after 48 hours, wearing an abdominal binder (when recommended), and using over the counter medications are all ways to help with pain management.   -Simple acts like meditation and mindfulness practices after surgery can also help with pain control and research has proven the benefit of these practices.  Medication: Take tylenol and ibuprofen as needed for pain control, alternating every 4-6 hours.  Example:  Tylenol 1000mg  @ 6am, 12noon, 6pm, (Do not exceed 4000mg  of tylenol a day). Ibuprofen 800mg  @ 9am, 3pm, 9pm, 3am (Do not exceed 3600mg  of ibuprofen a day).  Take Roxicodone for breakthrough pain every 4 hours.  Take Colace for constipation related to narcotic pain medication. If you do not have a bowel movement in 2 days,  take Miralax over the counter.  Drink plenty of water to also prevent constipation.   Contact Information: If you have questions or concerns, please call our office, [phone], Monday- Thursday 8AM-5PM and Friday 8AM-12Noon.  If it is after hours or on the weekend, please call [facility] Main Number, [phone], [phone], and ask to speak to the surgeon on call for Dr. Henreitta Leber at [facility].

## 2023-06-25 NOTE — Interval H&P Note (Signed)
History and Physical Interval Note:  06/25/2023 11:21 AM  Maurice Orozco  has presented today for surgery, with the diagnosis of CHOLELITHIASIS WITH CHRONIC CHOLECYSTITIS.  The various methods of treatment have been discussed with the patient and family. After consideration of risks, benefits and other options for treatment, the patient has consented to  Procedure(s): XI ROBOTIC ASSISTED LAPAROSCOPIC CHOLECYSTECTOMY (N/A) as a surgical intervention.  The patient's history has been reviewed, patient examined, no change in status, stable for surgery.  I have reviewed the patient's chart and labs.  Questions were answered to the patient's satisfaction.    Having pain in the right upper quadrant.   Lucretia Roers

## 2023-06-25 NOTE — Anesthesia Preprocedure Evaluation (Signed)
Anesthesia Evaluation  Patient identified by MRN, date of birth, ID band Patient awake    Reviewed: Allergy & Precautions, H&P , NPO status , Patient's Chart, lab work & pertinent test results, reviewed documented beta blocker date and time   Airway Mallampati: II  TM Distance: >3 FB Neck ROM: full    Dental no notable dental hx.    Pulmonary neg pulmonary ROS, pneumonia, COPD, former smoker   Pulmonary exam normal breath sounds clear to auscultation       Cardiovascular Exercise Tolerance: Good hypertension, + CAD  negative cardio ROS  Rhythm:regular Rate:Normal     Neuro/Psych   Anxiety     negative neurological ROS  negative psych ROS   GI/Hepatic negative GI ROS, Neg liver ROS,GERD  ,,  Endo/Other  negative endocrine ROS    Renal/GU Renal diseasenegative Renal ROS  negative genitourinary   Musculoskeletal   Abdominal   Peds  Hematology negative hematology ROS (+) Blood dyscrasia, anemia   Anesthesia Other Findings   Reproductive/Obstetrics negative OB ROS                             Anesthesia Physical Anesthesia Plan  ASA: 3  Anesthesia Plan: General and General ETT   Post-op Pain Management:    Induction:   PONV Risk Score and Plan: Scopolamine patch - Pre-op and Ondansetron  Airway Management Planned:   Additional Equipment:   Intra-op Plan:   Post-operative Plan:   Informed Consent: I have reviewed the patients History and Physical, chart, labs and discussed the procedure including the risks, benefits and alternatives for the proposed anesthesia with the patient or authorized representative who has indicated his/her understanding and acceptance.     Dental Advisory Given  Plan Discussed with: CRNA  Anesthesia Plan Comments:        Anesthesia Quick Evaluation

## 2023-06-26 ENCOUNTER — Other Ambulatory Visit: Payer: Self-pay

## 2023-06-26 NOTE — Transfer of Care (Signed)
Immediate Anesthesia Transfer of Care Note  Patient: Maurice Orozco  Procedure(s) Performed: XI ROBOTIC ASSISTED LAPAROSCOPIC CHOLECYSTECTOMY (Abdomen)  Patient Location: PACU  Anesthesia Type:General  Level of Consciousness: awake  Airway & Oxygen Therapy: Patient Spontanous Breathing  Post-op Assessment: Report given to RN  Post vital signs: Reviewed and stable  Last Vitals:  Vitals Value Taken Time  BP 139/79 06/25/23 1507  Temp 36.5 C 06/25/23 1507  Pulse 91 06/25/23 1507  Resp 16 06/25/23 1507  SpO2 97 % 06/25/23 1507    Last Pain:  Vitals:   06/25/23 1507  TempSrc: Oral  PainSc: 5       Patients Stated Pain Goal: 4 (06/25/23 0919)  Complications: No notable events documented.

## 2023-06-28 NOTE — Anesthesia Postprocedure Evaluation (Signed)
Anesthesia Post Note  Patient: SHAFIQ MAHLER  Procedure(s) Performed: XI ROBOTIC ASSISTED LAPAROSCOPIC CHOLECYSTECTOMY (Abdomen)  Patient location during evaluation: Phase II Anesthesia Type: General Level of consciousness: awake Pain management: pain level controlled Vital Signs Assessment: post-procedure vital signs reviewed and stable Respiratory status: spontaneous breathing and respiratory function stable Cardiovascular status: blood pressure returned to baseline and stable Postop Assessment: no headache and no apparent nausea or vomiting Anesthetic complications: no Comments: Late entry   No notable events documented.   Last Vitals:  Vitals:   06/25/23 1500 06/25/23 1507  BP: 114/64 139/79  Pulse: 89 91  Resp: 15 16  Temp:  36.5 C  SpO2: 97% 97%    Last Pain:  Vitals:   06/25/23 1507  TempSrc: Oral  PainSc: 5                  Windell Norfolk

## 2023-06-29 DIAGNOSIS — I10 Essential (primary) hypertension: Secondary | ICD-10-CM | POA: Diagnosis not present

## 2023-06-29 DIAGNOSIS — J449 Chronic obstructive pulmonary disease, unspecified: Secondary | ICD-10-CM | POA: Diagnosis not present

## 2023-06-29 DIAGNOSIS — C3432 Malignant neoplasm of lower lobe, left bronchus or lung: Secondary | ICD-10-CM | POA: Diagnosis not present

## 2023-06-29 DIAGNOSIS — M4316 Spondylolisthesis, lumbar region: Secondary | ICD-10-CM | POA: Diagnosis not present

## 2023-06-29 DIAGNOSIS — M1991 Primary osteoarthritis, unspecified site: Secondary | ICD-10-CM | POA: Diagnosis not present

## 2023-06-29 DIAGNOSIS — G894 Chronic pain syndrome: Secondary | ICD-10-CM | POA: Diagnosis not present

## 2023-06-29 DIAGNOSIS — M48061 Spinal stenosis, lumbar region without neurogenic claudication: Secondary | ICD-10-CM | POA: Diagnosis not present

## 2023-06-29 DIAGNOSIS — M5134 Other intervertebral disc degeneration, thoracic region: Secondary | ICD-10-CM | POA: Diagnosis not present

## 2023-06-29 LAB — SURGICAL PATHOLOGY

## 2023-06-30 ENCOUNTER — Other Ambulatory Visit: Payer: Self-pay | Admitting: Hematology

## 2023-06-30 NOTE — Progress Notes (Signed)
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 This is not anything concerning or malignant.

## 2023-07-02 ENCOUNTER — Other Ambulatory Visit: Payer: Self-pay

## 2023-07-08 ENCOUNTER — Ambulatory Visit (INDEPENDENT_AMBULATORY_CARE_PROVIDER_SITE_OTHER): Payer: Medicare Other | Admitting: General Surgery

## 2023-07-08 DIAGNOSIS — K8010 Calculus of gallbladder with chronic cholecystitis without obstruction: Secondary | ICD-10-CM

## 2023-07-08 NOTE — Progress Notes (Signed)
[facility]  I am calling the patient for post operative evaluation. This is not a billable encounter as it is under the global charges for the surgery.  The patient had a robotic assisted cholecystectomy on 06/25/23. The patient reports that he is doing fine from his operation.   He was on an antibiotic and some of his medication has interacted with it. He says he has a fine rash on his body. Dr. Sherwood Gambler is not aware of the medication issues or the rash. He says the rash is itching. He has not taken anything for the rash except benadryl cream. The rash is head to toe. He stopped the medication. He thinks the medication was for his UTI. The medicine was ciprofloxacin.   The are tolerating a diet, having good pain control, and having regular Bms.  The incisions are healing. The patient has no concerns. He has no jaundice.   Pathology: -Chronic cholecystitis with cholelithiasis  - Foveolar metaplasia  - No dysplasia or malignancy identified   Will see the patient PRN.  I told him he needs to let Dr. Sherwood Gambler know about this rash so he can be notified. Ciprofloxacin may need to be added to his allergies but if he stopped the medication and the rash is still there I am not sure if that was the reason.   Algis Greenhouse, MD [facility] [address] [place], [place] [address] [phone] (office)

## 2023-07-09 ENCOUNTER — Other Ambulatory Visit: Payer: Self-pay

## 2023-07-09 DIAGNOSIS — I10 Essential (primary) hypertension: Secondary | ICD-10-CM | POA: Diagnosis not present

## 2023-07-09 DIAGNOSIS — J449 Chronic obstructive pulmonary disease, unspecified: Secondary | ICD-10-CM | POA: Diagnosis not present

## 2023-07-14 ENCOUNTER — Inpatient Hospital Stay: Payer: Medicare Other | Attending: Physician Assistant

## 2023-07-14 ENCOUNTER — Inpatient Hospital Stay (HOSPITAL_BASED_OUTPATIENT_CLINIC_OR_DEPARTMENT_OTHER): Payer: Medicare Other | Admitting: Hematology

## 2023-07-14 ENCOUNTER — Inpatient Hospital Stay: Payer: Medicare Other

## 2023-07-14 DIAGNOSIS — Z95828 Presence of other vascular implants and grafts: Secondary | ICD-10-CM

## 2023-07-14 DIAGNOSIS — E538 Deficiency of other specified B group vitamins: Secondary | ICD-10-CM | POA: Diagnosis not present

## 2023-07-14 DIAGNOSIS — Z79899 Other long term (current) drug therapy: Secondary | ICD-10-CM | POA: Diagnosis not present

## 2023-07-14 DIAGNOSIS — N281 Cyst of kidney, acquired: Secondary | ICD-10-CM | POA: Insufficient documentation

## 2023-07-14 DIAGNOSIS — C7951 Secondary malignant neoplasm of bone: Secondary | ICD-10-CM | POA: Insufficient documentation

## 2023-07-14 DIAGNOSIS — Z87891 Personal history of nicotine dependence: Secondary | ICD-10-CM | POA: Insufficient documentation

## 2023-07-14 DIAGNOSIS — R21 Rash and other nonspecific skin eruption: Secondary | ICD-10-CM | POA: Diagnosis not present

## 2023-07-14 DIAGNOSIS — E876 Hypokalemia: Secondary | ICD-10-CM | POA: Diagnosis not present

## 2023-07-14 DIAGNOSIS — C3412 Malignant neoplasm of upper lobe, left bronchus or lung: Secondary | ICD-10-CM

## 2023-07-14 DIAGNOSIS — Z902 Acquired absence of lung [part of]: Secondary | ICD-10-CM | POA: Diagnosis not present

## 2023-07-14 LAB — COMPREHENSIVE METABOLIC PANEL
ALT: 17 U/L (ref 0–44)
AST: 17 U/L (ref 15–41)
Albumin: 3.6 g/dL (ref 3.5–5.0)
Alkaline Phosphatase: 68 U/L (ref 38–126)
Anion gap: 11 (ref 5–15)
BUN: 11 mg/dL (ref 8–23)
CO2: 24 mmol/L (ref 22–32)
Calcium: 8.8 mg/dL — ABNORMAL LOW (ref 8.9–10.3)
Chloride: 99 mmol/L (ref 98–111)
Creatinine, Ser: 0.79 mg/dL (ref 0.61–1.24)
GFR, Estimated: >60 mL/min (ref >60)
Glucose, Bld: 130 mg/dL — ABNORMAL HIGH (ref 70–99)
Potassium: 3.3 mmol/L — ABNORMAL LOW (ref 3.5–5.1)
Sodium: 134 mmol/L — ABNORMAL LOW (ref 135–145)
Total Bilirubin: 0.5 mg/dL (ref <1.2)
Total Protein: 6.6 g/dL (ref 6.5–8.1)

## 2023-07-14 LAB — CBC WITH DIFFERENTIAL/PLATELET
Abs Immature Granulocytes: 0.07 10*3/uL (ref 0.00–0.07)
Basophils Absolute: 0.1 10*3/uL (ref 0.0–0.1)
Basophils Relative: 1 %
Eosinophils Absolute: 0.5 10*3/uL (ref 0.0–0.5)
Eosinophils Relative: 4 %
HCT: 39.6 % (ref 39.0–52.0)
Hemoglobin: 13 g/dL (ref 13.0–17.0)
Immature Granulocytes: 1 %
Lymphocytes Relative: 5 %
Lymphs Abs: 0.6 10*3/uL — ABNORMAL LOW (ref 0.7–4.0)
MCH: 32.2 pg (ref 26.0–34.0)
MCHC: 32.8 g/dL (ref 30.0–36.0)
MCV: 98 fL (ref 80.0–100.0)
Monocytes Absolute: 0.5 10*3/uL (ref 0.1–1.0)
Monocytes Relative: 4 %
Neutro Abs: 10.5 10*3/uL — ABNORMAL HIGH (ref 1.7–7.7)
Neutrophils Relative %: 85 %
Platelets: 285 10*3/uL (ref 150–400)
RBC: 4.04 MIL/uL — ABNORMAL LOW (ref 4.22–5.81)
RDW: 12.5 % (ref 11.5–15.5)
WBC: 12.2 10*3/uL — ABNORMAL HIGH (ref 4.0–10.5)
nRBC: 0 % (ref 0.0–0.2)

## 2023-07-14 LAB — TSH: TSH: 0.777 u[IU]/mL (ref 0.350–4.500)

## 2023-07-14 LAB — MAGNESIUM: Magnesium: 1.8 mg/dL (ref 1.7–2.4)

## 2023-07-14 MED ORDER — METHYLPREDNISOLONE 4 MG PO TBPK: ORAL_TABLET | ORAL | 0 refills | Status: DC

## 2023-07-14 MED ORDER — SODIUM CHLORIDE 0.9% FLUSH
10.0000 mL | Freq: Once | INTRAVENOUS | Status: AC
  Administered 2023-07-14: 10 mL via INTRAVENOUS

## 2023-07-14 MED ORDER — HEPARIN SOD (PORK) LOCK FLUSH 100 UNIT/ML IV SOLN
500.0000 [IU] | Freq: Once | INTRAVENOUS | Status: AC
  Administered 2023-07-14: 500 [IU] via INTRAVENOUS

## 2023-07-14 NOTE — Progress Notes (Signed)
No treatment today d/t rash. Port flushed with good blood return noted. No bruising or swelling at site. Bandaid applied and patient discharged in satisfactory condition. VVS stable with no signs or symptoms of distressed noted.

## 2023-07-14 NOTE — Patient Instructions (Signed)
[facility] - A DEPT OF [facility]  Discharge Instructions: Thank you for choosing [facility] to provide your oncology and hematology care.  If you have a lab appointment with the Cancer Center - please note that after April 8th, 2024, all labs will be drawn in the cancer center.  You do not have to check in or register with the main entrance as you have in the past but will complete your check-in in the cancer center.  Wear comfortable clothing and clothing appropriate for easy access to any Portacath or PICC line.   We strive to give you quality time with your provider. You may need to reschedule your appointment if you arrive late (15 or more minutes).  Arriving late affects you and other patients whose appointments are after yours.  Also, if you miss three or more appointments without notifying the office, you may be dismissed from the clinic at the provider's discretion.      For prescription refill requests, have your pharmacy contact our office and allow 72 hours for refills to be completed.    Today you received the following no treatment, port flushed with labs.   To help prevent nausea and vomiting after your treatment, we encourage you to take your nausea medication as directed.  BELOW ARE SYMPTOMS THAT SHOULD BE REPORTED IMMEDIATELY: *FEVER GREATER THAN 100.4 F (38 C) OR HIGHER *CHILLS OR SWEATING *NAUSEA AND VOMITING THAT IS NOT CONTROLLED WITH YOUR NAUSEA MEDICATION *UNUSUAL SHORTNESS OF BREATH *UNUSUAL BRUISING OR BLEEDING *URINARY PROBLEMS (pain or burning when urinating, or frequent urination) *BOWEL PROBLEMS (unusual diarrhea, constipation, pain near the anus) TENDERNESS IN MOUTH AND THROAT WITH OR WITHOUT PRESENCE OF ULCERS (sore throat, sores in mouth, or a toothache) UNUSUAL RASH, SWELLING OR PAIN  UNUSUAL VAGINAL DISCHARGE OR ITCHING   Items with * indicate a potential emergency and should be followed up as soon as  possible or go to the Emergency Department if any problems should occur.  Please show the CHEMOTHERAPY ALERT CARD or IMMUNOTHERAPY ALERT CARD at check-in to the Emergency Department and triage nurse.  Should you have questions after your visit or need to cancel or reschedule your appointment, please contact [facility] - A DEPT OF [facility] [phone]  and follow the prompts.  Office hours are 8:00 a.m. to 4:30 p.m. Monday - Friday. Please note that voicemails left after 4:00 p.m. may not be returned until the following business day.  We are closed weekends and major holidays. You have access to a nurse at all times for urgent questions. Please call the main number to the clinic [phone] and follow the prompts.  For any non-urgent questions, you may also contact your provider using MyChart. We now offer e-Visits for anyone 75 and older to request care online for non-urgent symptoms. For details visit [email or web address].   Also download the MyChart app! Go to the app store, search "MyChart", open the app, select [place], and log in with your MyChart username and password.

## 2023-07-14 NOTE — Patient Instructions (Addendum)
[facility] at [facility] Discharge Instructions   You were seen and examined today by Dr. Ellin Saba.  He reviewed the results of your lab work which are normal/stable.   We will hold your treatment today due to your rash. We will send you a steroid dose pack - take as directed.   Please see Dr. Scharlene Gloss office as soon as possible regarding the rash.   We will see you back in 6 weeks.   Return as scheduled.    Thank you for choosing [facility] at [facility] to provide your oncology and hematology care.  To afford each patient quality time with our provider, please arrive at least 15 minutes before your scheduled appointment time.   If you have a lab appointment with the Cancer Center please come in thru the Main Entrance and check in at the main information desk.  You need to re-schedule your appointment should you arrive 10 or more minutes late.  We strive to give you quality time with our providers, and arriving late affects you and other patients whose appointments are after yours.  Also, if you no show three or more times for appointments you may be dismissed from the clinic at the providers discretion.     Again, thank you for choosing [facility].  Our hope is that these requests will decrease the amount of time that you wait before being seen by our physicians.       _____________________________________________________________  Should you have questions after your visit to [facility], please contact our office at [phone] and follow the prompts.  Our office hours are 8:00 a.m. and 4:30 p.m. Monday - Friday.  Please note that voicemails left after 4:00 p.m. may not be returned until the following business day.  We are closed weekends and major holidays.  You do have access to a nurse 24-7, just call the main number to the clinic [phone] and do not press any options, hold on the line and a nurse will  answer the phone.    For prescription refill requests, have your pharmacy contact our office and allow 72 hours.    Due to Covid, you will need to wear a mask upon entering the hospital. If you do not have a mask, a mask will be given to you at the Main Entrance upon arrival. For doctor visits, patients may have 1 support person age 42 or older with them. For treatment visits, patients can not have anyone with them due to social distancing guidelines and our immunocompromised population.

## 2023-07-14 NOTE — Progress Notes (Signed)
[facility] [address], [place] [address]    Clinic Day:  07/14/23   Referring physician: Elfredia Nevins, MD  Patient Care Team: Elfredia Nevins, MD as PCP - General (Internal Medicine) Marykay Lex, MD as PCP - Cardiology (Cardiology) Malissa Hippo, MD (Inactive) as Consulting Physician (Gastroenterology) Lonie Peak, MD as Attending Physician (Radiation Oncology)   ASSESSMENT & PLAN:   Assessment: 1.  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 - Status post right upper lobectomy in February 2012 for right lung cancer. - LDCT chest (04/14/2022): Enlarging spiculated nodular soft tissue thickening in the posterior left upper lobe measuring 11.4 mm. - PET scan (04/24/2022): Left upper lobe lung nodule 14 mm with SUV 3.62.  Hypermetabolic focus in the inferior aspect of the right scapula without discrete CT correlate with SUV 5.71.  No hypermetabolic adenopathy. - LUL lesion FNA (06/03/2022): Adenocarcinoma - Right scapula biopsy (07/03/2022) adenocarcinoma consistent with lung primary - NGS: PD-L1 22 C3 TPS 100%, T p53 pathogenic variant, MSI-stable, TMB-low, no other targetable mutations - Cycle 1 of pembrolizumab started on 07/21/2022 - XRT to the right scapular lesion and left upper lobe lung lesion 5 fractions completed on 08/18/2022.   2.  Cystic lesion of left kidney - CTA PE on 04/14/2022 showed complex cystic lesion of the left kidney.  PET scan also showed exophytic 18 mm left renal lesion suspicious for malignancy. - MRI abdomen on 05/16/2022 showed benign appearing Bosniak category 1 and 2 left renal cyst with no evidence of neoplasm.  No further work-up needed.    Plan: 1.  Metastatic adenocarcinoma of the left upper lobe of the lung to the scapula: - PET scan on 01/15/2023: No evidence of new or progressive disease. - We reviewed CT CAP from 06/11/2023: No evidence of  recurrence or progression.  Stable sclerosis involving inferior right scapula. - Last Keytruda on 05/28/2023. - Keytruda on hold as he underwent gallbladder surgery on 06/25/2023. - He reported worsening rash on the lower back and legs and right forearm after taking Cipro given by Dr. Sherwood Gambler.  He has a erythematous plaque in the lower back and pustular areas over the shoulders and lateral chest wall and legs. - I will hold his treatment today.  Will give him Medrol Dosepak.  He will follow-up with Dr. Margo Aye of dermatology as soon as possible. - RTC 6 weeks for follow-up with possible treatment.   2.  Left posterior chest wall rash: - He has erythematous maculopapular rash on the left loin up to left side of the umbilicus. - Continue prednisone 5 mg daily.  Use clobetasol dipropionate ointment twice daily.  3.  B12 deficiency - Continue B12 every other day and iron tablet daily.   4.  Hypokalemia: - Continue potassium 20 mEq daily.  Potassium is 3.9 today.    No orders of the defined types were placed in this encounter.     Alben Deeds Teague,acting as a Neurosurgeon for Doreatha Massed, MD.,have documented all relevant documentation on the behalf of Doreatha Massed, MD,as directed by  Doreatha Massed, MD while in the presence of Doreatha Massed, MD.  I, Doreatha Massed MD, have reviewed the above documentation for accuracy and completeness, and I agree with the above.     [address] R Teague   11/5/20248:14 AM  CHIEF COMPLAINT:   Diagnosis: bilateral lung cancers, normocytic anemia, and B12 deficiency    Cancer  Staging  Adenocarcinoma of lung (HCC) Staging form: Lung, AJCC 7th Edition - Clinical: Stage IA (T1a, N0, M0) - Signed by [name], PA on 05/06/2011  Adenocarcinoma of upper lobe of left lung [facility]) Staging form: Lung, AJCC 8th Edition - Clinical stage from 07/16/2022: Stage IVA (cT1b, cN0, pM1b) - Unsigned    Prior Therapy: 1. Right upper lobectomy  (February 2012) for right lung cancer 2. SBRT (November 2015) for stage I left adenocarcinoma  Current Therapy:  Pembrolizumab    HISTORY OF PRESENT ILLNESS:   Oncology History  Adenocarcinoma of lung (HCC)  09/30/2010 Cancer Staging   CT of chest showed spiculated lesion   10/15/2010 Surgery   Right upper lobectomy by Dr. Edwyna Shell   10/16/2010 Remission     10/24/2013 Imaging   CT Chest-  Enlarging subpleural nodule in the left lower lobe, now 9 mm, with distortion of the overlying pleura. Finding is concerning for a small bronchogenic carcinoma   01/23/2014 Imaging   CT Chest- Left lower lobe nodule has enlarged from 11/01/2012 and is worrisome for primary bronchogenic carcinoma.    07/04/2014 - 07/10/2014 Radiation Therapy   SBRT by Dr. Basilio Cairo in 3 fractions.   07/05/2015 Imaging   perifissural nodule in LLL appears slightly smaller with parenchymal opacity attrib to XRT, likely inflamm pathcy airspace dz more inferiorly in LLL, airspace dz in RLL resolved. no metastatic disease   Primary cancer of left lower lobe of lung (HCC)  06/18/2014 Initial Diagnosis   Left lower lobe nodule suspicious for primary lung carcinoma   07/04/2014 - 07/10/2014 Radiation Therapy   SBRT   10/12/2014 Imaging   No acute findings within the chest. Decrease in size of left lower lobe perifissural nodule compatible with response to therapy. No new or progressive disease identified within the chest.   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)  07/16/2022 Initial Diagnosis   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)   07/21/2022 -  Chemotherapy   Patient is on Treatment Plan : LUNG NSCLC Pembrolizumab (200) q21d        INTERVAL HISTORY:   Peng is a 77 y.o. male presenting to clinic today for follow up of bilateral lung cancers, normocytic anemia, and B12 deficiency. He was last seen by me on 04/15/23.  Since his last visit, he has been seen by urgent care and the ED for RUQ and right flank  pain, likely due to a UTI and recently passed kidney stone. He also underwent cholecystectomy on 06/25/23 with Dr. Henreitta Leber. Pathology revealed: chronic cholecystitis with cholelithiasis, foveolar metaplasia, and no dysplasia or malignancy identified. He had CT C/A/P on 06/11/23 that found: fiducial marker in the posterior left upper lobe with associated scarring and treated tumor; radiation changes in the left lower lobe; patchy left upper lobe opacities, some of which may reflect scarring/atelectasis, although superimposed mild infection/pneumonia is possible; stable moderate compression fracture of T7; and stable sclerosis involving the inferior right scapula, related to prior pathologic fracture.  Today, he states that he is doing well overall. His appetite level is at 80%. His energy level is at 80%.  Rash has worsened since his last visit, encompassing the bilateral arms, back, and shins. Rash has worsened since taking Cipro. His rash began months ago and was prescribed hydroxyzine. He then saw Dr. Sherwood Gambler gave Zyrtec. He currently takes Zyrtec and Benadryl. He has used all of his betamethasone cream in an attempt to resolve the rash. He is not taking  Prednisone everyday. His back pain is stable.    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 dense RCA calcification --Non-ischemic Myoview   History of kidney stones    in the 70's   History of radiation therapy 07/04/14, 07/06/14, 07/10/14    LLL lung nodule/54 Gy/3 fx- SBRT   Hypercholesterolemia 05/06/2011   Pneumonia due to Streptococcus    HX. POST OPERATIVELY   Port-A-Cath in place 07/21/2022   Pre-diabetes    Stress fx pelvis    DUE TO AA IN 1970    Surgical History: Past Surgical History:  Procedure Laterality Date   BACK SURGERY  07/04/2017   fusion above previous surgery   BACK SURGERY  2011   metal plate M-5,H8   BRONCHIAL BIOPSY  06/03/2022   Procedure: BRONCHIAL BIOPSIES;  Surgeon: Josephine Igo, DO;  Location: [facility];  Service: Pulmonary;;   BRONCHIAL BRUSHINGS  06/03/2022   Procedure: BRONCHIAL BRUSHINGS;  Surgeon: Josephine Igo, DO;  Location: [facility];  Service: Pulmonary;;   BRONCHIAL NEEDLE ASPIRATION BIOPSY  06/03/2022   Procedure: BRONCHIAL NEEDLE ASPIRATION BIOPSIES;  Surgeon: Josephine Igo, DO;  Location: [facility];  Service: Pulmonary;;   Coroanry Calcium Score  03/2017   1043. Noted especially in RCA. Dense calcification, recommend Myoview over Cor CTA.   FIDUCIAL MARKER PLACEMENT  06/03/2022   Procedure: FIDUCIAL MARKER PLACEMENT;  Surgeon: Josephine Igo, DO;  Location: [facility];  Service: Pulmonary;;   IR IMAGING GUIDED PORT INSERTION  08/04/2022   LIPOMA EXCISION N/A 10/14/2017   Procedure: EXCISION LIPOMA ON BACK;  Surgeon: Lucretia Roers, MD;  Location: [facility];  Service: General;  Laterality: N/A;   LIPOMA EXCISION Left 11/19/2018   Procedure: MINOR EXCISION OF SEBACEOUS CYST NECK;  Surgeon: Lucretia Roers, MD;  Location: [facility];  Service: General;  Laterality: Left;  pt knows to arrive at 7:00   LUNG LOBECTOMY  2012   RT UPPER LOBE   NM MYOVIEW LTD  04/2017   EF 60%. Hypertensive response to exercise. (6:21 min, 7.7 METs) No EKG changes. LOW RISK    Social History: Social History   Socioeconomic History   Marital status: Married    Spouse name: Not on file   Number of children: 3   Years of education: Not on file   Highest education level: Not on  file  Occupational History   Not on file  Tobacco Use   Smoking status: Former    Current packs/day: 0.00    Average packs/day: 1.5 packs/day for 59.0 years (88.5 ttl pk-yrs)    Types: Cigarettes    Start date: 09/09/1951    Quit date: 09/08/2010    Years since quitting: 12.8   Smokeless tobacco: Current    Types: Chew   Tobacco comments:    still chews tobacco, he tells me he hasn't in  the past 2 days.   Vaping Use   Vaping status: Never Used  Substance and Sexual Activity   Alcohol use: No   Drug use: No   Sexual activity: Not Currently  Other Topics Concern   Not on file  Social History Narrative   Not on file   Social Determinants of Health   Financial Resource Strain: Not on file  Food Insecurity: Not on file  Transportation Needs: No Transportation Needs (07/13/2018)   PRAPARE - Administrator, Civil Service (Medical): No    Lack of Transportation (Non-Medical): No  Physical Activity: Not on file  Stress: Not on file  Social Connections: Not on file  Intimate Partner Violence: Not At Risk (07/13/2018)   Humiliation, Afraid, Rape, and Kick questionnaire    Fear of Current or Ex-Partner: No    Emotionally Abused: No    Physically Abused: No    Sexually Abused: No    Family History: Family History  Problem Relation Age of Onset   Hypertension Mother    Kidney disease Father    Heart attack Brother    Cancer Maternal Uncle        prostate cancer   Cancer Maternal Uncle        prostate cancer   Cancer Maternal Uncle        prostate cancer   Cancer Maternal Uncle        prostate cancer    Current Medications:  Current Outpatient Medications:    albuterol (PROVENTIL HFA;VENTOLIN HFA) 108 (90 Base) MCG/ACT inhaler, Inhale 2 puffs into the lungs every 4 (four) hours as needed for wheezing or shortness of breath., Disp: 2 Inhaler, Rfl: 3   albuterol (PROVENTIL) (2.5 MG/3ML) 0.083% nebulizer solution, Take 2.5 mg by nebulization every 6 (six) hours as  needed for wheezing or shortness of breath., Disp: , Rfl:    B Complex-C (SUPER B COMPLEX PO), Take 1 tablet by mouth daily., Disp: , Rfl:    cetirizine (ZYRTEC) 10 MG tablet, Take 10 mg by mouth daily., Disp: , Rfl:    cholecalciferol (VITAMIN D3) 25 MCG (1000 UNIT) tablet, Take 1,000 Units by mouth daily., Disp: , Rfl:    cimetidine (TAGAMET) 200 MG tablet, Take 400-600 mg by mouth daily as needed (for heartburn)., Disp: , Rfl:    clobetasol ointment (TEMOVATE) 0.05 %, Apply 1 Application topically daily as needed (itching)., Disp: , Rfl:    ferrous sulfate 325 (65 FE) MG tablet, Take 325 mg by mouth daily with breakfast., Disp: , Rfl:    fluticasone (FLONASE) 50 MCG/ACT nasal spray, Place 2 sprays into both nostrils daily as needed for rhinitis., Disp: , Rfl:    Glycopyrrolate-Formoterol (BEVESPI AEROSPHERE) 9-4.8 MCG/ACT AERO, Inhale 2 puffs into the lungs 2 (two) times daily., Disp: , Rfl:    guaiFENesin (MUCINEX) 600 MG 12 hr tablet, Take 600 mg by mouth daily., Disp: , Rfl:    hydrochlorothiazide (HYDRODIURIL) 25 MG tablet, Take 25 mg by mouth daily., Disp: , Rfl:    HYDROcodone-acetaminophen (NORCO) 10-325 MG tablet, Take 1-2 tablets by mouth every 4 (four) hours as needed for moderate pain., Disp: 240 tablet, Rfl: 0   hydrOXYzine (VISTARIL) 25 MG capsule, Take 25-50 mg by mouth every 6 (six) hours as needed for itching., Disp: , Rfl:    KLOR-CON M10 10 MEQ tablet, Take 20 mEq by mouth daily., Disp: , Rfl:    KLOR-CON M20 20 MEQ tablet, TAKE 1 TABLET BY MOUTH EVERY DAY,  Disp: 90 tablet, Rfl: 1   lidocaine-prilocaine (EMLA) cream, Apply a small amount to port a cath site and cover with plastic wrap 1 hour prior to infusion appointments (Patient taking differently: Apply 1 Application topically daily as needed (prior to port access).), Disp: 30 g, Rfl: 3   naproxen sodium (ALEVE) 220 MG tablet, Take 220 mg by mouth daily as needed (pain)., Disp: , Rfl:    Omega-3 Fatty Acids (FISH OIL) 1000 MG  CAPS, Take 1,000 mg by mouth daily., Disp: , Rfl:    ondansetron (ZOFRAN) 4 MG tablet, Take 1 tablet (4 mg total) by mouth every 8 (eight) hours as needed., Disp: 30 tablet, Rfl: 1   oxyCODONE (ROXICODONE) 5 MG immediate release tablet, Take 1 tablet (5 mg total) by mouth every 4 (four) hours as needed for severe pain (pain score 7-10) or breakthrough pain., Disp: 10 tablet, Rfl: 0   pantoprazole (PROTONIX) 40 MG tablet, Take 40 mg by mouth See admin instructions. Take 40 mg daily, may take a second 40 mg dose as needed for heartburn, Disp: , Rfl:    PEMBROLIZUMAB IV, Inject into the vein every 21 ( twenty-one) days., Disp: , Rfl:    polyethylene glycol powder (GLYCOLAX/MIRALAX) 17 GM/SCOOP powder, Take 17 g by mouth daily as needed for mild constipation or moderate constipation., Disp: , Rfl:    predniSONE (DELTASONE) 5 MG tablet, TAKE 1 TABLET BY MOUTH EVERY DAY WITH BREAKFAST, Disp: 30 tablet, Rfl: 4   prochlorperazine (COMPAZINE) 10 MG tablet, Take 1 tablet (10 mg total) by mouth every 6 (six) hours as needed for nausea or vomiting., Disp: 30 tablet, Rfl: 1   rosuvastatin (CRESTOR) 40 MG tablet, Take 40 mg by mouth at bedtime., Disp: , Rfl:    tamsulosin (FLOMAX) 0.4 MG CAPS capsule, TAKE 1 CAPSULE BY MOUTH EVERY DAY, Disp: 90 capsule, Rfl: 1   triazolam (HALCION) 0.25 MG tablet, Take 0.25 mg by mouth at bedtime., Disp: , Rfl:  No current facility-administered medications for this visit.  Facility-Administered Medications Ordered in Other Visits:    sodium chloride flush (NS) 0.9 % injection 10 mL, 10 mL, Intracatheter, PRN, Doreatha Massed, MD, 10 mL at 10/07/22 1523   sodium chloride flush (NS) 0.9 % injection 10 mL, 10 mL, Intracatheter, PRN, Doreatha Massed, MD, 10 mL at 10/28/22 1446   sodium chloride flush (NS) 0.9 % injection 10 mL, 10 mL, Intracatheter, PRN, Doreatha Massed, MD, 10 mL at 11/19/22 1547   Allergies: Allergies  Allergen Reactions   Fentanyl Other (See  Comments)    CAUSED HALLUCINATIONS/05-03-13 pt reports it was oral fentanyl that caused hallucinations    Gabapentin Other (See Comments)    Bones throbbed, headaches, weakness   Nabumetone     Unknown reaction    Aspirin Nausea Only    REVIEW OF SYSTEMS:   Review of Systems  Constitutional:  Negative for chills, fatigue and fever.  HENT:   Negative for lump/mass, mouth sores, nosebleeds, sore throat and trouble swallowing.   Eyes:  Negative for eye problems.  Respiratory:  Negative for cough and shortness of breath.   Cardiovascular:  Negative for chest pain, leg swelling and palpitations.  Gastrointestinal:  Negative for abdominal pain, constipation, diarrhea, nausea and vomiting.  Genitourinary:  Negative for bladder incontinence, difficulty urinating, dysuria, frequency, hematuria and nocturia.   Musculoskeletal:  Positive for back pain (8/10 severity). Negative for arthralgias, flank pain, myalgias and neck pain.  Skin:  Positive for rash. Negative for itching.  Neurological:  Negative for dizziness, headaches and numbness.  Hematological:  Bruises/bleeds easily.  Psychiatric/Behavioral:  Negative for depression, sleep disturbance and suicidal ideas. The patient is not nervous/anxious.   All other systems reviewed and are negative.    VITALS:   There were no vitals taken for this visit.  Wt Readings from Last 3 Encounters:  06/25/23 143 lb 1.3 oz (64.9 kg)  06/24/23 143 lb (64.9 kg)  06/16/23 143 lb (64.9 kg)    There is no height or weight on file to calculate BMI.  Performance status (ECOG): 1 - Symptomatic but completely ambulatory  PHYSICAL EXAM:   Physical Exam Vitals and nursing note reviewed. Exam conducted with a chaperone present.  Constitutional:      Appearance: Normal appearance.  Cardiovascular:     Rate and Rhythm: Normal rate and regular rhythm.     Pulses: Normal pulses.     Heart sounds: Normal heart sounds.  Pulmonary:     Effort: Pulmonary  effort is normal.     Breath sounds: Normal breath sounds.  Abdominal:     Palpations: Abdomen is soft. There is no hepatomegaly, splenomegaly or mass.     Tenderness: There is no abdominal tenderness.  Musculoskeletal:     Right lower leg: No edema.     Left lower leg: No edema.  Lymphadenopathy:     Cervical: No cervical adenopathy.     Right cervical: No superficial, deep or posterior cervical adenopathy.    Left cervical: No superficial, deep or posterior cervical adenopathy.     Upper Body:     Right upper body: No supraclavicular or axillary adenopathy.     Left upper body: No supraclavicular or axillary adenopathy.  Skin:         Comments: +Erythematous plaque on the lower back extending through the midline +Several pustular lesions on the upper back and bilateral upper shins  Neurological:     General: No focal deficit present.     Mental Status: He is alert and oriented to person, place, and time.  Psychiatric:        Mood and Affect: Mood normal.        Behavior: Behavior normal.     LABS:      Latest Ref Rng & Units 05/28/2023   12:20 PM 05/05/2023    7:02 PM 05/03/2023    4:01 PM  CBC  WBC 4.0 - 10.5 K/uL 15.1  8.5  12.4   Hemoglobin 13.0 - 17.0 g/dL 54.0  98.1  19.1   Hematocrit 39.0 - 52.0 % 38.3  35.9  39.8   Platelets 150 - 400 K/uL 262  231  266       Latest Ref Rng & Units 06/24/2023   12:56 PM 05/28/2023   12:20 PM 05/05/2023    7:02 PM  CMP  Glucose 70 - 99 mg/dL 478  295  621   BUN 8 - 23 mg/dL 14  24  17    Creatinine 0.61 - 1.24 mg/dL 3.08  6.57  8.46   Sodium 135 - 145 mmol/L 136  135  131   Potassium 3.5 - 5.1 mmol/L 3.6  2.9  3.8   Chloride 98 - 111 mmol/L 100  99  99   CO2 22 - 32 mmol/L 25  24  21    Calcium 8.9 - 10.3 mg/dL 9.4  8.8  8.7   Total Protein 6.5 - 8.1 g/dL  6.9  6.9   Total Bilirubin 0.3 -  1.2 mg/dL  1.0  0.4   Alkaline Phos 38 - 126 U/L  73  94   AST 15 - 41 U/L  39  85   ALT 0 - 44 U/L  54  84      No results found  for: "CEA1", "CEA" / No results found for: "CEA1", "CEA" No results found for: "PSA1" No results found for: "WGN562" No results found for: "CAN125"  No results found for: "TOTALPROTELP", "ALBUMINELP", "A1GS", "A2GS", "BETS", "BETA2SER", "GAMS", "MSPIKE", "SPEI" Lab Results  Component Value Date   TIBC 310 09/30/2022   TIBC 323 04/14/2022   TIBC 373 10/01/2021   FERRITIN 108 09/30/2022   FERRITIN 65 04/14/2022   FERRITIN 49 10/01/2021   IRONPCTSAT 13 (L) 09/30/2022   IRONPCTSAT 22 04/14/2022   IRONPCTSAT 27 10/01/2021   Lab Results  Component Value Date   LDH 120 06/28/2021   LDH 130 12/20/2020   LDH 97 (L) 07/12/2018     STUDIES:   No results found.

## 2023-07-16 ENCOUNTER — Other Ambulatory Visit: Payer: Self-pay

## 2023-07-16 DIAGNOSIS — T368X5A Adverse effect of other systemic antibiotics, initial encounter: Secondary | ICD-10-CM | POA: Diagnosis not present

## 2023-07-16 DIAGNOSIS — L508 Other urticaria: Secondary | ICD-10-CM | POA: Diagnosis not present

## 2023-07-30 ENCOUNTER — Other Ambulatory Visit: Payer: Self-pay | Admitting: Hematology

## 2023-07-30 DIAGNOSIS — M48061 Spinal stenosis, lumbar region without neurogenic claudication: Secondary | ICD-10-CM | POA: Diagnosis not present

## 2023-07-30 DIAGNOSIS — M546 Pain in thoracic spine: Secondary | ICD-10-CM | POA: Diagnosis not present

## 2023-07-30 DIAGNOSIS — J449 Chronic obstructive pulmonary disease, unspecified: Secondary | ICD-10-CM | POA: Diagnosis not present

## 2023-07-30 DIAGNOSIS — M5134 Other intervertebral disc degeneration, thoracic region: Secondary | ICD-10-CM | POA: Diagnosis not present

## 2023-08-01 ENCOUNTER — Other Ambulatory Visit: Payer: Self-pay

## 2023-08-04 ENCOUNTER — Inpatient Hospital Stay: Payer: Medicare Other

## 2023-08-08 ENCOUNTER — Other Ambulatory Visit: Payer: Self-pay

## 2023-08-08 DIAGNOSIS — J449 Chronic obstructive pulmonary disease, unspecified: Secondary | ICD-10-CM | POA: Diagnosis not present

## 2023-08-08 DIAGNOSIS — I10 Essential (primary) hypertension: Secondary | ICD-10-CM | POA: Diagnosis not present

## 2023-08-20 DIAGNOSIS — G8929 Other chronic pain: Secondary | ICD-10-CM | POA: Diagnosis not present

## 2023-08-20 DIAGNOSIS — L0390 Cellulitis, unspecified: Secondary | ICD-10-CM | POA: Diagnosis not present

## 2023-08-20 DIAGNOSIS — M5134 Other intervertebral disc degeneration, thoracic region: Secondary | ICD-10-CM | POA: Diagnosis not present

## 2023-08-20 DIAGNOSIS — Z6821 Body mass index (BMI) 21.0-21.9, adult: Secondary | ICD-10-CM | POA: Diagnosis not present

## 2023-08-20 DIAGNOSIS — L309 Dermatitis, unspecified: Secondary | ICD-10-CM | POA: Diagnosis not present

## 2023-08-20 DIAGNOSIS — C3432 Malignant neoplasm of lower lobe, left bronchus or lung: Secondary | ICD-10-CM | POA: Diagnosis not present

## 2023-08-20 DIAGNOSIS — M546 Pain in thoracic spine: Secondary | ICD-10-CM | POA: Diagnosis not present

## 2023-08-20 DIAGNOSIS — M48061 Spinal stenosis, lumbar region without neurogenic claudication: Secondary | ICD-10-CM | POA: Diagnosis not present

## 2023-08-20 DIAGNOSIS — I10 Essential (primary) hypertension: Secondary | ICD-10-CM | POA: Diagnosis not present

## 2023-08-20 DIAGNOSIS — J449 Chronic obstructive pulmonary disease, unspecified: Secondary | ICD-10-CM | POA: Diagnosis not present

## 2023-08-27 ENCOUNTER — Other Ambulatory Visit: Payer: Self-pay

## 2023-08-28 ENCOUNTER — Other Ambulatory Visit: Payer: Self-pay

## 2023-08-31 ENCOUNTER — Inpatient Hospital Stay: Payer: Medicare Other

## 2023-08-31 ENCOUNTER — Inpatient Hospital Stay: Payer: Medicare Other | Attending: Physician Assistant

## 2023-08-31 ENCOUNTER — Inpatient Hospital Stay (HOSPITAL_BASED_OUTPATIENT_CLINIC_OR_DEPARTMENT_OTHER): Payer: Medicare Other | Admitting: Hematology

## 2023-08-31 VITALS — BP 114/73 | HR 82 | Temp 97.5°F | Resp 18 | Wt 135.2 lb

## 2023-08-31 DIAGNOSIS — Z79620 Long term (current) use of immunosuppressive biologic: Secondary | ICD-10-CM | POA: Diagnosis not present

## 2023-08-31 DIAGNOSIS — Z87891 Personal history of nicotine dependence: Secondary | ICD-10-CM | POA: Diagnosis not present

## 2023-08-31 DIAGNOSIS — C3412 Malignant neoplasm of upper lobe, left bronchus or lung: Secondary | ICD-10-CM

## 2023-08-31 DIAGNOSIS — Z5112 Encounter for antineoplastic immunotherapy: Secondary | ICD-10-CM | POA: Insufficient documentation

## 2023-08-31 DIAGNOSIS — E538 Deficiency of other specified B group vitamins: Secondary | ICD-10-CM | POA: Diagnosis not present

## 2023-08-31 DIAGNOSIS — Z95828 Presence of other vascular implants and grafts: Secondary | ICD-10-CM

## 2023-08-31 LAB — COMPREHENSIVE METABOLIC PANEL
ALT: 29 U/L (ref 0–44)
AST: 22 U/L (ref 15–41)
Albumin: 4.1 g/dL (ref 3.5–5.0)
Alkaline Phosphatase: 59 U/L (ref 38–126)
Anion gap: 7 (ref 5–15)
BUN: 22 mg/dL (ref 8–23)
CO2: 24 mmol/L (ref 22–32)
Calcium: 9 mg/dL (ref 8.9–10.3)
Chloride: 103 mmol/L (ref 98–111)
Creatinine, Ser: 1.01 mg/dL (ref 0.61–1.24)
GFR, Estimated: >60 mL/min (ref >60)
Glucose, Bld: 129 mg/dL — ABNORMAL HIGH (ref 70–99)
Potassium: 3 mmol/L — ABNORMAL LOW (ref 3.5–5.1)
Sodium: 134 mmol/L — ABNORMAL LOW (ref 135–145)
Total Bilirubin: 0.6 mg/dL (ref <1.2)
Total Protein: 6.8 g/dL (ref 6.5–8.1)

## 2023-08-31 LAB — CBC WITH DIFFERENTIAL/PLATELET
Abs Immature Granulocytes: 0.09 10*3/uL — ABNORMAL HIGH (ref 0.00–0.07)
Basophils Absolute: 0.1 10*3/uL (ref 0.0–0.1)
Basophils Relative: 1 %
Eosinophils Absolute: 0.2 10*3/uL (ref 0.0–0.5)
Eosinophils Relative: 3 %
HCT: 41.4 % (ref 39.0–52.0)
Hemoglobin: 13.7 g/dL (ref 13.0–17.0)
Immature Granulocytes: 1 %
Lymphocytes Relative: 14 %
Lymphs Abs: 1.1 10*3/uL (ref 0.7–4.0)
MCH: 32.9 pg (ref 26.0–34.0)
MCHC: 33.1 g/dL (ref 30.0–36.0)
MCV: 99.5 fL (ref 80.0–100.0)
Monocytes Absolute: 0.5 10*3/uL (ref 0.1–1.0)
Monocytes Relative: 6 %
Neutro Abs: 6.3 10*3/uL (ref 1.7–7.7)
Neutrophils Relative %: 75 %
Platelets: 234 10*3/uL (ref 150–400)
RBC: 4.16 MIL/uL — ABNORMAL LOW (ref 4.22–5.81)
RDW: 13.2 % (ref 11.5–15.5)
WBC: 8.3 10*3/uL (ref 4.0–10.5)
nRBC: 0 % (ref 0.0–0.2)

## 2023-08-31 LAB — MAGNESIUM: Magnesium: 1.8 mg/dL (ref 1.7–2.4)

## 2023-08-31 MED ORDER — SODIUM CHLORIDE 0.9 % IV SOLN: Freq: Once | INTRAVENOUS | Status: AC

## 2023-08-31 MED ORDER — SODIUM CHLORIDE 0.9 % IV SOLN
200.0000 mg | Freq: Once | INTRAVENOUS | Status: AC
  Administered 2023-08-31: 200 mg via INTRAVENOUS
  Filled 2023-08-31: qty 8

## 2023-08-31 MED ORDER — SODIUM CHLORIDE 0.9% FLUSH
10.0000 mL | INTRAVENOUS | Status: DC | PRN
  Administered 2023-08-31: 10 mL

## 2023-08-31 MED ORDER — HEPARIN SOD (PORK) LOCK FLUSH 100 UNIT/ML IV SOLN
500.0000 [IU] | Freq: Once | INTRAVENOUS | Status: AC | PRN
  Administered 2023-08-31: 500 [IU]

## 2023-08-31 NOTE — Progress Notes (Signed)
Patient presents today for Keytruda infusion per providers order.  Vital signs and labs reviewed by MD.  Message received from Chapman Moss RN/Dr. Ellin Saba patient okay for [facility].  Treatment given today per MD orders.  Stable during infusion without adverse affects.  Vital signs stable.  No complaints at this time.  Discharge from clinic ambulatory in stable condition.  Alert and oriented X 3.  Follow up with [facility] as scheduled.

## 2023-08-31 NOTE — Progress Notes (Signed)
Patient has been examined by Dr. Katragadda. Vital signs and labs have been reviewed by MD - ANC, Creatinine, LFTs, hemoglobin, and platelets are within treatment parameters per M.D. - pt may proceed with treatment.  Primary RN and pharmacy notified.  

## 2023-08-31 NOTE — Patient Instructions (Signed)

## 2023-08-31 NOTE — Progress Notes (Signed)
[facility] [address] [place] [address]    Clinic Day:  08/31/2023  Referring physician: Elfredia Nevins, MD  Patient Care Team: Elfredia Nevins, MD as PCP - General (Internal Medicine) Marykay Lex, MD as PCP - Cardiology (Cardiology) Malissa Hippo, MD (Inactive) as Consulting Physician (Gastroenterology) Lonie Peak, MD as Attending Physician (Radiation Oncology)   ASSESSMENT & PLAN:   Assessment: 1.  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 - Status post right upper lobectomy in February 2012 for right lung cancer. - LDCT chest (04/14/2022): Enlarging spiculated nodular soft tissue thickening in the posterior left upper lobe measuring 11.4 mm. - PET scan (04/24/2022): Left upper lobe lung nodule 14 mm with SUV 3.62.  Hypermetabolic focus in the inferior aspect of the right scapula without discrete CT correlate with SUV 5.71.  No hypermetabolic adenopathy. - LUL lesion FNA (06/03/2022): Adenocarcinoma - Right scapula biopsy (07/03/2022) adenocarcinoma consistent with lung primary - NGS: PD-L1 22 C3 TPS 100%, T p53 pathogenic variant, MSI-stable, TMB-low, no other targetable mutations - Cycle 1 of pembrolizumab started on 07/21/2022 - XRT to the right scapular lesion and left upper lobe lung lesion 5 fractions completed on 08/18/2022.   2.  Cystic lesion of left kidney - CTA PE on 04/14/2022 showed complex cystic lesion of the left kidney.  PET scan also showed exophytic 18 mm left renal lesion suspicious for malignancy. - MRI abdomen on 05/16/2022 showed benign appearing Bosniak category 1 and 2 left renal cyst with no evidence of neoplasm.  No further work-up needed.    Plan: 1.  Metastatic adenocarcinoma of the left upper lobe of the lung to the scapula: - CT CAP on 06/11/2023 with no evidence of recurrence or progression.  Stable sclerosis involving inferior right  scapula. - Last Keytruda on 05/28/2023.  Rande Lawman was held last time due to worsening skin rash. - Skin rash has resolved. - Reviewed labs today: Normal LFTs and creatinine.  CBC was normal.  TSH is 0.7. - He will restart Keytruda today and continue in 3 weeks.  RTC 6 weeks for follow-up.  Will plan to repeat CT chest with contrast prior to next visit.  He does not have any metastatic disease in the abdomen or pelvis.  Hence we will do CTAP once every 6 months.   2.  Left posterior chest wall rash: - This has resolved after antibiotic with Keflex.  He is continuing prednisone 5 mg daily.  He also uses clobetasol dipropionate ointment twice daily as needed.   3.  B12 deficiency - Continue B12 every other day and iron tablet daily.   4.  Hypokalemia: - He is taking potassium 10 mill equivalents twice daily, which was changed for better swallowing.  His potassium today is 3.0.  He will receive oral potassium.    Orders Placed This Encounter  Procedures   CT CHEST W CONTRAST    Standing Status:   Future    Expected Date:   10/01/2023    Expiration Date:   08/30/2024    If indicated for the ordered procedure, I authorize the administration of contrast media per Radiology protocol:   Yes    Does the patient have a contrast media/X-ray dye allergy?:   No    Preferred imaging location?:   [facility]   Magnesium    Standing Status:   Future    Expected Date:  11/23/2023    Expiration Date:   11/22/2024   CBC with Differential    Standing Status:   Future    Expected Date:   11/23/2023    Expiration Date:   11/22/2024   Comprehensive metabolic panel    Standing Status:   Future    Expected Date:   11/23/2023    Expiration Date:   11/22/2024   Magnesium    Standing Status:   Future    Expected Date:   12/14/2023    Expiration Date:   12/13/2024   CBC with Differential    Standing Status:   Future    Expected Date:   12/14/2023    Expiration Date:   12/13/2024   Comprehensive metabolic  panel    Standing Status:   Future    Expected Date:   12/14/2023    Expiration Date:   12/13/2024   Magnesium    Standing Status:   Future    Expected Date:   01/04/2024    Expiration Date:   01/03/2025   CBC with Differential    Standing Status:   Future    Expected Date:   01/04/2024    Expiration Date:   01/03/2025   Comprehensive metabolic panel    Standing Status:   Future    Expected Date:   01/04/2024    Expiration Date:   01/03/2025   TSH    Standing Status:   Future    Expected Date:   01/04/2024    Expiration Date:   01/03/2025      I,Katie Daubenspeck,acting as a scribe for Doreatha Massed, MD.,have documented all relevant documentation on the behalf of Doreatha Massed, MD,as directed by  Doreatha Massed, MD while in the presence of Doreatha Massed, MD.   I, Doreatha Massed MD, have reviewed the above documentation for accuracy and completeness, and I agree with the above.   Doreatha Massed, MD   12/23/20244:51 PM  CHIEF COMPLAINT:   Diagnosis: bilateral lung cancers, normocytic anemia, and B12 deficiency    Cancer Staging  Adenocarcinoma of lung (HCC) Staging form: Lung, AJCC 7th Edition - Clinical: Stage IA (T1a, N0, M0) - Signed by [name], PA on 05/06/2011  Adenocarcinoma of upper lobe of left lung [facility]) Staging form: Lung, AJCC 8th Edition - Clinical stage from 07/16/2022: Stage IVA (cT1b, cN0, pM1b) - Unsigned    Prior Therapy: 1. Right upper lobectomy (February 2012) for right lung cancer 2. SBRT (November 2015) for stage I left adenocarcinoma  Current Therapy:  Pembrolizumab    HISTORY OF PRESENT ILLNESS:   Oncology History  Adenocarcinoma of lung (HCC)  09/30/2010 Cancer Staging   CT of chest showed spiculated lesion   10/15/2010 Surgery   Right upper lobectomy by Dr. Edwyna Shell   10/16/2010 Remission     10/24/2013 Imaging   CT Chest-  Enlarging subpleural nodule in the left lower lobe, now 9 mm, with distortion of the  overlying pleura. Finding is concerning for a small bronchogenic carcinoma   01/23/2014 Imaging   CT Chest- Left lower lobe nodule has enlarged from 11/01/2012 and is worrisome for primary bronchogenic carcinoma.    07/04/2014 - 07/10/2014 Radiation Therapy   SBRT by Dr. Basilio Cairo in 3 fractions.   07/05/2015 Imaging   perifissural nodule in LLL appears slightly smaller with parenchymal opacity attrib to XRT, likely inflamm pathcy airspace dz more inferiorly in LLL, airspace dz in RLL resolved. no metastatic disease   Primary cancer of left lower  lobe of lung (HCC)  06/18/2014 Initial Diagnosis   Left lower lobe nodule suspicious for primary lung carcinoma   07/04/2014 - 07/10/2014 Radiation Therapy   SBRT   10/12/2014 Imaging   No acute findings within the chest. Decrease in size of left lower lobe perifissural nodule compatible with response to therapy. No new or progressive disease identified within the chest.   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)  07/16/2022 Initial Diagnosis   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)   07/21/2022 -  Chemotherapy   Patient is on Treatment Plan : LUNG NSCLC Pembrolizumab (200) q21d        INTERVAL HISTORY:   Maurice Orozco is a 77 y.o. male presenting to clinic today for follow up of bilateral lung cancers, normocytic anemia, and B12 deficiency. He was last seen by me on 07/14/23.  Today, he states that he is doing well overall. His appetite level is at 90%. His energy level is at 90%.  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 dense RCA calcification --Non-ischemic Myoview   History of kidney stones    in the 70's   History of radiation therapy 07/04/14, 07/06/14, 07/10/14   LLL lung nodule/54 Gy/3 fx- SBRT   Hypercholesterolemia 05/06/2011   Pneumonia due to Streptococcus    HX. POST OPERATIVELY   Port-A-Cath in place 07/21/2022   Pre-diabetes    Stress fx pelvis    DUE TO AA IN 1970    Surgical History: Past Surgical History:  Procedure Laterality Date   BACK SURGERY  07/04/2017   fusion above previous surgery   BACK SURGERY  2011   metal plate N-8,G9   BRONCHIAL BIOPSY  06/03/2022   Procedure: BRONCHIAL BIOPSIES;  Surgeon: Josephine Igo, DO;  Location: [facility];  Service: Pulmonary;;   BRONCHIAL BRUSHINGS  06/03/2022   Procedure: BRONCHIAL BRUSHINGS;  Surgeon: Josephine Igo, DO;  Location: [facility];  Service: Pulmonary;;   BRONCHIAL NEEDLE ASPIRATION BIOPSY  06/03/2022   Procedure: BRONCHIAL NEEDLE ASPIRATION BIOPSIES;  Surgeon: Josephine Igo, DO;  Location: [facility];  Service: Pulmonary;;   Coroanry Calcium Score  03/2017   1043. Noted especially in RCA. Dense calcification, recommend Myoview over Cor CTA.   FIDUCIAL MARKER PLACEMENT  06/03/2022   Procedure: FIDUCIAL MARKER PLACEMENT;  Surgeon: Josephine Igo, DO;  Location: [facility];  Service: Pulmonary;;   IR IMAGING GUIDED PORT INSERTION  08/04/2022   LIPOMA EXCISION N/A 10/14/2017   Procedure: EXCISION LIPOMA ON BACK;  Surgeon: Lucretia Roers, MD;  Location: [facility];  Service: General;  Laterality: N/A;   LIPOMA EXCISION Left 11/19/2018   Procedure: MINOR EXCISION OF SEBACEOUS CYST NECK;  Surgeon: Lucretia Roers, MD;  Location: [facility];  Service: General;  Laterality: Left;  pt knows to arrive at 7:00   LUNG LOBECTOMY  2012  RT UPPER LOBE   NM MYOVIEW LTD  04/2017   EF 60%. Hypertensive response to exercise. (6:21  min, 7.7 METs) No EKG changes. LOW RISK    Social History: Social History   Socioeconomic History   Marital status: Married    Spouse name: Not on file   Number of children: 3   Years of education: Not on file   Highest education level: Not on file  Occupational History   Not on file  Tobacco Use   Smoking status: Former    Current packs/day: 0.00    Average packs/day: 1.5 packs/day for 59.0 years (88.5 ttl pk-yrs)    Types: Cigarettes    Start date: 09[birth year]09/1950    Quit date: 09/08/2010    Years since quitting: 12.9   Smokeless tobacco: Current    Types: Chew   Tobacco comments:    still chews tobacco, he tells me he hasn't in the past 2 days.   Vaping Use   Vaping status: Never Used  Substance and Sexual Activity   Alcohol use: No   Drug use: No   Sexual activity: Not Currently  Other Topics Concern   Not on file  Social History Narrative   Not on file   Social Drivers of Health   Financial Resource Strain: Not on file  Food Insecurity: Not on file  Transportation Needs: No Transportation Needs (07/13/2018)   PRAPARE - Administrator, Civil Service (Medical): No    Lack of Transportation (Non-Medical): No  Physical Activity: Not on file  Stress: Not on file  Social Connections: Not on file  Intimate Partner Violence: Not At Risk (07/13/2018)   Humiliation, Afraid, Rape, and Kick questionnaire    Fear of Current or Ex-Partner: No    Emotionally Abused: No    Physically Abused: No    Sexually Abused: No    Family History: Family History  Problem Relation Age of Onset   Hypertension Mother    Kidney disease Father    Heart attack Brother    Cancer Maternal Uncle        prostate cancer   Cancer Maternal Uncle        prostate cancer   Cancer Maternal Uncle        prostate cancer   Cancer Maternal Uncle        prostate cancer    Current Medications:  Current Outpatient Medications:    albuterol (PROVENTIL HFA;VENTOLIN HFA) 108 (90 Base)  MCG/ACT inhaler, Inhale 2 puffs into the lungs every 4 (four) hours as needed for wheezing or shortness of breath., Disp: 2 Inhaler, Rfl: 3   albuterol (PROVENTIL) (2.5 MG/3ML) 0.083% nebulizer solution, Take 2.5 mg by nebulization every 6 (six) hours as needed for wheezing or shortness of breath., Disp: , Rfl:    B Complex-C (SUPER B COMPLEX PO), Take 1 tablet by mouth daily., Disp: , Rfl:    betamethasone dipropionate 0.05 % cream, APPLY TOPICALLY TWICE A DAY, Disp: 30 g, Rfl: 1   cephALEXin (KEFLEX) 500 MG capsule, Take 500 mg by mouth every 6 (six) hours., Disp: , Rfl:    cetirizine (ZYRTEC) 10 MG tablet, Take 10 mg by mouth daily., Disp: , Rfl:    cholecalciferol (VITAMIN D3) 25 MCG (1000 UNIT) tablet, Take 1,000 Units by mouth daily., Disp: , Rfl:    cimetidine (TAGAMET) 200 MG tablet, Take 400-600 mg by mouth daily as needed (for heartburn)., Disp: , Rfl:    clobetasol ointment (TEMOVATE) 0.05 %,  Apply 1 Application topically daily as needed (itching)., Disp: , Rfl:    ferrous sulfate 325 (65 FE) MG tablet, Take 325 mg by mouth daily with breakfast., Disp: , Rfl:    fluticasone (FLONASE) 50 MCG/ACT nasal spray, Place 2 sprays into both nostrils daily as needed for rhinitis., Disp: , Rfl:    Glycopyrrolate-Formoterol (BEVESPI AEROSPHERE) 9-4.8 MCG/ACT AERO, Inhale 2 puffs into the lungs 2 (two) times daily., Disp: , Rfl:    guaiFENesin (MUCINEX) 600 MG 12 hr tablet, Take 600 mg by mouth daily., Disp: , Rfl:    hydrochlorothiazide (HYDRODIURIL) 25 MG tablet, Take 25 mg by mouth daily., Disp: , Rfl:    HYDROcodone-acetaminophen (NORCO) 10-325 MG tablet, Take 1-2 tablets by mouth every 4 (four) hours as needed for moderate pain., Disp: 240 tablet, Rfl: 0   hydrOXYzine (VISTARIL) 25 MG capsule, Take 25-50 mg by mouth every 6 (six) hours as needed for itching., Disp: , Rfl:    KLOR-CON M10 10 MEQ tablet, Take 20 mEq by mouth daily., Disp: , Rfl:    lidocaine-prilocaine (EMLA) cream, Apply a small  amount to port a cath site and cover with plastic wrap 1 hour prior to infusion appointments, Disp: 30 g, Rfl: 3   naproxen sodium (ALEVE) 220 MG tablet, Take 220 mg by mouth daily as needed (pain)., Disp: , Rfl:    Omega-3 Fatty Acids (FISH OIL) 1000 MG CAPS, Take 1,000 mg by mouth daily., Disp: , Rfl:    ondansetron (ZOFRAN) 4 MG tablet, Take 1 tablet (4 mg total) by mouth every 8 (eight) hours as needed., Disp: 30 tablet, Rfl: 1   oxyCODONE (ROXICODONE) 5 MG immediate release tablet, Take 1 tablet (5 mg total) by mouth every 4 (four) hours as needed for severe pain (pain score 7-10) or breakthrough pain., Disp: 10 tablet, Rfl: 0   pantoprazole (PROTONIX) 40 MG tablet, Take 40 mg by mouth See admin instructions. Take 40 mg daily, may take a second 40 mg dose as needed for heartburn, Disp: , Rfl:    PEMBROLIZUMAB IV, Inject into the vein every 21 ( twenty-one) days., Disp: , Rfl:    polyethylene glycol powder (GLYCOLAX/MIRALAX) 17 GM/SCOOP powder, Take 17 g by mouth daily as needed for mild constipation or moderate constipation., Disp: , Rfl:    predniSONE (DELTASONE) 5 MG tablet, TAKE 1 TABLET BY MOUTH EVERY DAY WITH BREAKFAST, Disp: 30 tablet, Rfl: 4   prochlorperazine (COMPAZINE) 10 MG tablet, Take 1 tablet (10 mg total) by mouth every 6 (six) hours as needed for nausea or vomiting., Disp: 30 tablet, Rfl: 1   rosuvastatin (CRESTOR) 40 MG tablet, Take 40 mg by mouth at bedtime., Disp: , Rfl:    tamsulosin (FLOMAX) 0.4 MG CAPS capsule, TAKE 1 CAPSULE BY MOUTH EVERY DAY, Disp: 90 capsule, Rfl: 1   triazolam (HALCION) 0.25 MG tablet, Take 0.25 mg by mouth at bedtime., Disp: , Rfl:  No current facility-administered medications for this visit.  Facility-Administered Medications Ordered in Other Visits:    sodium chloride flush (NS) 0.9 % injection 10 mL, 10 mL, Intracatheter, PRN, Doreatha Massed, MD, 10 mL at 10/07/22 1523   sodium chloride flush (NS) 0.9 % injection 10 mL, 10 mL, Intracatheter,  PRN, Doreatha Massed, MD, 10 mL at 10/28/22 1446   sodium chloride flush (NS) 0.9 % injection 10 mL, 10 mL, Intracatheter, PRN, Doreatha Massed, MD, 10 mL at 11/19/22 1547   sodium chloride flush (NS) 0.9 % injection 10 mL, 10 mL, Intracatheter,  PRN, Doreatha Massed, MD, 10 mL at 08/31/23 1153   Allergies: Allergies  Allergen Reactions   Fentanyl Other (See Comments)    CAUSED HALLUCINATIONS/05-03-13 pt reports it was oral fentanyl that caused hallucinations    Gabapentin Other (See Comments)    Bones throbbed, headaches, weakness   Nabumetone     Unknown reaction    Aspirin Nausea Only   Ciprofloxacin Hcl Rash    REVIEW OF SYSTEMS:   Review of Systems  Constitutional:  Negative for chills, fatigue and fever.  HENT:   Negative for lump/mass, mouth sores, nosebleeds, sore throat and trouble swallowing.   Eyes:  Negative for eye problems.  Respiratory:  Negative for cough and shortness of breath.   Cardiovascular:  Negative for chest pain, leg swelling and palpitations.  Gastrointestinal:  Negative for abdominal pain, constipation, diarrhea, nausea and vomiting.  Genitourinary:  Negative for bladder incontinence, difficulty urinating, dysuria, frequency, hematuria and nocturia.   Musculoskeletal:  Positive for back pain. Negative for arthralgias, flank pain, myalgias and neck pain.  Skin:  Negative for itching and rash.  Neurological:  Negative for dizziness, headaches and numbness.  Hematological:  Does not bruise/bleed easily.  Psychiatric/Behavioral:  Negative for depression, sleep disturbance and suicidal ideas. The patient is not nervous/anxious.   All other systems reviewed and are negative.    VITALS:   There were no vitals taken for this visit.  Wt Readings from Last 3 Encounters:  08/31/23 135 lb 3.2 oz (61.3 kg)  07/14/23 137 lb 12.8 oz (62.5 kg)  06/25/23 143 lb 1.3 oz (64.9 kg)    There is no height or weight on file to calculate BMI.  Performance  status (ECOG): 1 - Symptomatic but completely ambulatory  PHYSICAL EXAM:   Physical Exam Vitals and nursing note reviewed. Exam conducted with a chaperone present.  Constitutional:      Appearance: Normal appearance.  Cardiovascular:     Rate and Rhythm: Normal rate and regular rhythm.     Pulses: Normal pulses.     Heart sounds: Normal heart sounds.  Pulmonary:     Effort: Pulmonary effort is normal.     Breath sounds: Normal breath sounds.  Abdominal:     Palpations: Abdomen is soft. There is no hepatomegaly, splenomegaly or mass.     Tenderness: There is no abdominal tenderness.  Musculoskeletal:     Right lower leg: No edema.     Left lower leg: No edema.  Lymphadenopathy:     Cervical: No cervical adenopathy.     Right cervical: No superficial, deep or posterior cervical adenopathy.    Left cervical: No superficial, deep or posterior cervical adenopathy.     Upper Body:     Right upper body: No supraclavicular or axillary adenopathy.     Left upper body: No supraclavicular or axillary adenopathy.  Neurological:     General: No focal deficit present.     Mental Status: He is alert and oriented to person, place, and time.  Psychiatric:        Mood and Affect: Mood normal.        Behavior: Behavior normal.     LABS:      Latest Ref Rng & Units 08/31/2023    9:38 AM 07/14/2023   11:00 AM 05/28/2023   12:20 PM  CBC  WBC 4.0 - 10.5 K/uL 8.3  12.2  15.1   Hemoglobin 13.0 - 17.0 g/dL 09.8  11.9  14.7   Hematocrit 39.0 - 52.0 %  41.4  39.6  38.3   Platelets 150 - 400 K/uL 234  285  262       Latest Ref Rng & Units 08/31/2023    9:38 AM 07/14/2023   11:00 AM 06/24/2023   12:56 PM  CMP  Glucose 70 - 99 mg/dL 161  096  045   BUN 8 - 23 mg/dL 22  11  14    Creatinine 0.61 - 1.24 mg/dL 4.09  8.11  9.14   Sodium 135 - 145 mmol/L 134  134  136   Potassium 3.5 - 5.1 mmol/L 3.0  3.3  3.6   Chloride 98 - 111 mmol/L 103  99  100   CO2 22 - 32 mmol/L 24  24  25    Calcium 8.9  - 10.3 mg/dL 9.0  8.8  9.4   Total Protein 6.5 - 8.1 g/dL 6.8  6.6    Total Bilirubin <1.2 mg/dL 0.6  0.5    Alkaline Phos 38 - 126 U/L 59  68    AST 15 - 41 U/L 22  17    ALT 0 - 44 U/L 29  17       No results found for: "CEA1", "CEA" / No results found for: "CEA1", "CEA" No results found for: "PSA1" No results found for: "NWG956" No results found for: "CAN125"  No results found for: "TOTALPROTELP", "ALBUMINELP", "A1GS", "A2GS", "BETS", "BETA2SER", "GAMS", "MSPIKE", "SPEI" Lab Results  Component Value Date   TIBC 310 09/30/2022   TIBC 323 04/14/2022   TIBC 373 10/01/2021   FERRITIN 108 09/30/2022   FERRITIN 65 04/14/2022   FERRITIN 49 10/01/2021   IRONPCTSAT 13 (L) 09/30/2022   IRONPCTSAT 22 04/14/2022   IRONPCTSAT 27 10/01/2021   Lab Results  Component Value Date   LDH 120 06/28/2021   LDH 130 12/20/2020   LDH 97 (L) 07/12/2018     STUDIES:   No results found.

## 2023-08-31 NOTE — Progress Notes (Signed)
Patient presents today for Keytruda infusion per providers order.  Vital signs and labs reviewed by MD.

## 2023-08-31 NOTE — Patient Instructions (Signed)
 [facility] [place] - A DEPT OF [facility]  Discharge Instructions: Thank you for choosing [facility] to provide your oncology and hematology care.  If you have a lab appointment with the Cancer Center - please note that after April 8th, 2024, all labs will be drawn in the cancer center.  You do not have to check in or register with the main entrance as you have in the past but will complete your check-in in the cancer center.  Wear comfortable clothing and clothing appropriate for easy access to any Portacath or PICC line.   We strive to give you quality time with your provider. You may need to reschedule your appointment if you arrive late (15 or more minutes).  Arriving late affects you and other patients whose appointments are after yours.  Also, if you miss three or more appointments without notifying the office, you may be dismissed from the clinic at the provider's discretion.      For prescription refill requests, have your pharmacy contact our office and allow 72 hours for refills to be completed.    Today you received the following chemotherapy and/or immunotherapy agents Keytruda   To help prevent nausea and vomiting after your treatment, we encourage you to take your nausea medication as directed.  BELOW ARE SYMPTOMS THAT SHOULD BE REPORTED IMMEDIATELY: *FEVER GREATER THAN 100.4 F (38 C) OR HIGHER *CHILLS OR SWEATING *NAUSEA AND VOMITING THAT IS NOT CONTROLLED WITH YOUR NAUSEA MEDICATION *UNUSUAL SHORTNESS OF BREATH *UNUSUAL BRUISING OR BLEEDING *URINARY PROBLEMS (pain or burning when urinating, or frequent urination) *BOWEL PROBLEMS (unusual diarrhea, constipation, pain near the anus) TENDERNESS IN MOUTH AND THROAT WITH OR WITHOUT PRESENCE OF ULCERS (sore throat, sores in mouth, or a toothache) UNUSUAL RASH, SWELLING OR PAIN  UNUSUAL VAGINAL DISCHARGE OR ITCHING   Items with * indicate a potential emergency and should be followed up as  soon as possible or go to the Emergency Department if any problems should occur.  Please show the CHEMOTHERAPY ALERT CARD or IMMUNOTHERAPY ALERT CARD at check-in to the Emergency Department and triage nurse.  Should you have questions after your visit or need to cancel or reschedule your appointment, please contact [facility] [place] - A DEPT OF [facility] [phone]  and follow the prompts.  Office hours are 8:00 a.m. to 4:30 p.m. Monday - Friday. Please note that voicemails left after 4:00 p.m. may not be returned until the following business day.  We are closed weekends and major holidays. You have access to a nurse at all times for urgent questions. Please call the main number to the clinic [phone] and follow the prompts.  For any non-urgent questions, you may also contact your provider using MyChart. We now offer e-Visits for anyone 5 and older to request care online for non-urgent symptoms. For details visit [email or web address].   Also download the MyChart app! Go to the app store, search "MyChart", open the app, select , and log in with your MyChart username and password.

## 2023-09-04 ENCOUNTER — Other Ambulatory Visit: Payer: Self-pay

## 2023-09-15 ENCOUNTER — Other Ambulatory Visit: Payer: Self-pay | Admitting: Hematology

## 2023-09-21 ENCOUNTER — Inpatient Hospital Stay: Payer: Medicare Other

## 2023-09-21 ENCOUNTER — Inpatient Hospital Stay: Payer: Medicare Other | Attending: Physician Assistant

## 2023-09-21 VITALS — BP 140/83 | HR 86 | Temp 97.2°F | Resp 18 | Wt 138.0 lb

## 2023-09-21 DIAGNOSIS — Z95828 Presence of other vascular implants and grafts: Secondary | ICD-10-CM

## 2023-09-21 DIAGNOSIS — C3412 Malignant neoplasm of upper lobe, left bronchus or lung: Secondary | ICD-10-CM | POA: Diagnosis not present

## 2023-09-21 DIAGNOSIS — Z5112 Encounter for antineoplastic immunotherapy: Secondary | ICD-10-CM | POA: Diagnosis not present

## 2023-09-21 DIAGNOSIS — F17220 Nicotine dependence, chewing tobacco, uncomplicated: Secondary | ICD-10-CM | POA: Insufficient documentation

## 2023-09-21 DIAGNOSIS — C7951 Secondary malignant neoplasm of bone: Secondary | ICD-10-CM | POA: Insufficient documentation

## 2023-09-21 DIAGNOSIS — Z79620 Long term (current) use of immunosuppressive biologic: Secondary | ICD-10-CM | POA: Insufficient documentation

## 2023-09-21 LAB — COMPREHENSIVE METABOLIC PANEL
ALT: 25 U/L (ref 0–44)
AST: 23 U/L (ref 15–41)
Albumin: 3.8 g/dL (ref 3.5–5.0)
Alkaline Phosphatase: 46 U/L (ref 38–126)
Anion gap: 8 (ref 5–15)
BUN: 25 mg/dL — ABNORMAL HIGH (ref 8–23)
CO2: 24 mmol/L (ref 22–32)
Calcium: 8.6 mg/dL — ABNORMAL LOW (ref 8.9–10.3)
Chloride: 100 mmol/L (ref 98–111)
Creatinine, Ser: 0.72 mg/dL (ref 0.61–1.24)
GFR, Estimated: >60 mL/min (ref >60)
Glucose, Bld: 109 mg/dL — ABNORMAL HIGH (ref 70–99)
Potassium: 3.5 mmol/L (ref 3.5–5.1)
Sodium: 132 mmol/L — ABNORMAL LOW (ref 135–145)
Total Bilirubin: 0.4 mg/dL (ref 0.0–1.2)
Total Protein: 6.4 g/dL — ABNORMAL LOW (ref 6.5–8.1)

## 2023-09-21 LAB — CBC WITH DIFFERENTIAL/PLATELET
Abs Immature Granulocytes: 0.09 10*3/uL — ABNORMAL HIGH (ref 0.00–0.07)
Basophils Absolute: 0.1 10*3/uL (ref 0.0–0.1)
Basophils Relative: 1 %
Eosinophils Absolute: 0.3 10*3/uL (ref 0.0–0.5)
Eosinophils Relative: 3 %
HCT: 36.3 % — ABNORMAL LOW (ref 39.0–52.0)
Hemoglobin: 12.1 g/dL — ABNORMAL LOW (ref 13.0–17.0)
Immature Granulocytes: 1 %
Lymphocytes Relative: 6 %
Lymphs Abs: 0.6 10*3/uL — ABNORMAL LOW (ref 0.7–4.0)
MCH: 33.3 pg (ref 26.0–34.0)
MCHC: 33.3 g/dL (ref 30.0–36.0)
MCV: 100 fL (ref 80.0–100.0)
Monocytes Absolute: 0.4 10*3/uL (ref 0.1–1.0)
Monocytes Relative: 5 %
Neutro Abs: 7.5 10*3/uL (ref 1.7–7.7)
Neutrophils Relative %: 84 %
Platelets: 261 10*3/uL (ref 150–400)
RBC: 3.63 MIL/uL — ABNORMAL LOW (ref 4.22–5.81)
RDW: 12.9 % (ref 11.5–15.5)
WBC: 8.9 10*3/uL (ref 4.0–10.5)
nRBC: 0 % (ref 0.0–0.2)

## 2023-09-21 LAB — MAGNESIUM: Magnesium: 1.9 mg/dL (ref 1.7–2.4)

## 2023-09-21 MED ORDER — SODIUM CHLORIDE 0.9% FLUSH
10.0000 mL | INTRAVENOUS | Status: DC | PRN
  Administered 2023-09-21: 10 mL

## 2023-09-21 MED ORDER — SODIUM CHLORIDE 0.9 % IV SOLN
200.0000 mg | Freq: Once | INTRAVENOUS | Status: AC
  Administered 2023-09-21: 200 mg via INTRAVENOUS
  Filled 2023-09-21: qty 8

## 2023-09-21 MED ORDER — HEPARIN SOD (PORK) LOCK FLUSH 100 UNIT/ML IV SOLN
500.0000 [IU] | Freq: Once | INTRAVENOUS | Status: AC | PRN
  Administered 2023-09-21: 500 [IU]

## 2023-09-21 MED ORDER — SODIUM CHLORIDE 0.9 % IV SOLN: Freq: Once | INTRAVENOUS | Status: AC

## 2023-09-21 NOTE — Progress Notes (Signed)
 Labs reviewed today. Ok to treat.    Treatment given per orders. Patient tolerated it well without problems. Vitals stable and discharged home from clinic ambulatory. Follow up as scheduled.

## 2023-09-21 NOTE — Patient Instructions (Signed)
 [facility] [place] - A DEPT OF [facility]  Discharge Instructions: Thank you for choosing [facility] to provide your oncology and hematology care.  If you have a lab appointment with the Cancer Center - please note that after April 8th, 2024, all labs will be drawn in the cancer center.  You do not have to check in or register with the main entrance as you have in the past but will complete your check-in in the cancer center.  Wear comfortable clothing and clothing appropriate for easy access to any Portacath or PICC line.   We strive to give you quality time with your provider. You may need to reschedule your appointment if you arrive late (15 or more minutes).  Arriving late affects you and other patients whose appointments are after yours.  Also, if you miss three or more appointments without notifying the office, you may be dismissed from the clinic at the provider's discretion.      For prescription refill requests, have your pharmacy contact our office and allow 72 hours for refills to be completed.    Today you received the following chemotherapy and/or immunotherapy agents pembrolizumab   To help prevent nausea and vomiting after your treatment, we encourage you to take your nausea medication as directed.  BELOW ARE SYMPTOMS THAT SHOULD BE REPORTED IMMEDIATELY: *FEVER GREATER THAN 100.4 F (38 C) OR HIGHER *CHILLS OR SWEATING *NAUSEA AND VOMITING THAT IS NOT CONTROLLED WITH YOUR NAUSEA MEDICATION *UNUSUAL SHORTNESS OF BREATH *UNUSUAL BRUISING OR BLEEDING *URINARY PROBLEMS (pain or burning when urinating, or frequent urination) *BOWEL PROBLEMS (unusual diarrhea, constipation, pain near the anus) TENDERNESS IN MOUTH AND THROAT WITH OR WITHOUT PRESENCE OF ULCERS (sore throat, sores in mouth, or a toothache) UNUSUAL RASH, SWELLING OR PAIN  UNUSUAL VAGINAL DISCHARGE OR ITCHING   Items with * indicate a potential emergency and should be followed  up as soon as possible or go to the Emergency Department if any problems should occur.  Please show the CHEMOTHERAPY ALERT CARD or IMMUNOTHERAPY ALERT CARD at check-in to the Emergency Department and triage nurse.  Should you have questions after your visit or need to cancel or reschedule your appointment, please contact [facility] [place] - A DEPT OF Eligha Bridegroom [facility] [phone]  and follow the prompts.  Office hours are 8:00 a.m. to 4:30 p.m. Monday - Friday. Please note that voicemails left after 4:00 p.m. may not be returned until the following business day.  We are closed weekends and major holidays. You have access to a nurse at all times for urgent questions. Please call the main number to the clinic [phone] and follow the prompts.  For any non-urgent questions, you may also contact your provider using MyChart. We now offer e-Visits for anyone 92 and older to request care online for non-urgent symptoms. For details visit [email or web address].   Also download the MyChart app! Go to the app store, search "MyChart", open the app, select [facility], and log in with your MyChart username and password.

## 2023-10-01 ENCOUNTER — Encounter (HOSPITAL_COMMUNITY): Payer: Self-pay

## 2023-10-01 ENCOUNTER — Ambulatory Visit (HOSPITAL_COMMUNITY): Payer: Medicare Other

## 2023-10-06 ENCOUNTER — Ambulatory Visit (HOSPITAL_COMMUNITY)
Admission: RE | Admit: 2023-10-06 | Discharge: 2023-10-06 | Disposition: A | Discharge status: Home / Self Care | Payer: Medicare Other | Source: Ambulatory Visit | Attending: Hematology | Admitting: Hematology

## 2023-10-06 DIAGNOSIS — I700 Atherosclerosis of aorta: Secondary | ICD-10-CM | POA: Diagnosis not present

## 2023-10-06 DIAGNOSIS — C3412 Malignant neoplasm of upper lobe, left bronchus or lung: Secondary | ICD-10-CM | POA: Diagnosis not present

## 2023-10-06 DIAGNOSIS — J439 Emphysema, unspecified: Secondary | ICD-10-CM | POA: Diagnosis not present

## 2023-10-06 DIAGNOSIS — C3490 Malignant neoplasm of unspecified part of unspecified bronchus or lung: Secondary | ICD-10-CM | POA: Diagnosis not present

## 2023-10-06 MED ORDER — IOHEXOL 300 MG/ML  SOLN
75.0000 mL | Freq: Once | INTRAMUSCULAR | Status: AC | PRN
  Administered 2023-10-06: 75 mL via INTRAVENOUS

## 2023-10-10 ENCOUNTER — Other Ambulatory Visit: Payer: Self-pay

## 2023-10-11 ENCOUNTER — Other Ambulatory Visit: Payer: Self-pay

## 2023-10-12 ENCOUNTER — Inpatient Hospital Stay: Payer: Medicare Other | Admitting: Hematology

## 2023-10-12 ENCOUNTER — Ambulatory Visit: Payer: Medicare Other

## 2023-10-12 ENCOUNTER — Inpatient Hospital Stay: Payer: Medicare Other

## 2023-10-15 ENCOUNTER — Telehealth: Payer: Self-pay | Admitting: *Deleted

## 2023-10-15 ENCOUNTER — Other Ambulatory Visit: Payer: Self-pay | Admitting: *Deleted

## 2023-10-15 MED ORDER — AMOXICILLIN-POT CLAVULANATE 875-125 MG PO TABS: 1.0000 | ORAL_TABLET | Freq: Two times a day (BID) | ORAL | 0 refills | Status: DC

## 2023-10-15 MED ORDER — METHYLPREDNISOLONE 4 MG PO TBPK: ORAL_TABLET | ORAL | 0 refills | Status: AC

## 2023-10-15 NOTE — Telephone Encounter (Signed)
 Wife called concerning CT chest that showed pneumonia.  Dr. Katragadda reviewed.  At this time patient is asymptomatic, however had a 2 week span of productive cough, fever and generalized malaise.  Per Dr. Katragadda, sent in Medrol  dose pack and Augmentin  x 7 days.  Wife aware.

## 2023-10-16 DIAGNOSIS — Z6821 Body mass index (BMI) 21.0-21.9, adult: Secondary | ICD-10-CM | POA: Diagnosis not present

## 2023-10-16 DIAGNOSIS — G8929 Other chronic pain: Secondary | ICD-10-CM | POA: Diagnosis not present

## 2023-10-16 DIAGNOSIS — M5134 Other intervertebral disc degeneration, thoracic region: Secondary | ICD-10-CM | POA: Diagnosis not present

## 2023-10-16 DIAGNOSIS — J449 Chronic obstructive pulmonary disease, unspecified: Secondary | ICD-10-CM | POA: Diagnosis not present

## 2023-10-16 DIAGNOSIS — C3432 Malignant neoplasm of lower lobe, left bronchus or lung: Secondary | ICD-10-CM | POA: Diagnosis not present

## 2023-10-16 DIAGNOSIS — M48061 Spinal stenosis, lumbar region without neurogenic claudication: Secondary | ICD-10-CM | POA: Diagnosis not present

## 2023-10-16 DIAGNOSIS — M48062 Spinal stenosis, lumbar region with neurogenic claudication: Secondary | ICD-10-CM | POA: Diagnosis not present

## 2023-10-17 ENCOUNTER — Other Ambulatory Visit: Payer: Self-pay

## 2023-10-18 NOTE — Progress Notes (Signed)
 [facility] [address] [place] [address]    Clinic Day:  10/19/2023  Referring physician: Kathyleen Parkins, MD  Patient Care Team: Kathyleen Parkins, MD as PCP - General (Internal Medicine) Arleen Lacer, MD as PCP - Cardiology (Cardiology) Ruby Corporal, MD (Inactive) as Consulting Physician (Gastroenterology) Colie Dawes, MD as Attending Physician (Radiation Oncology)   ASSESSMENT & PLAN:   Assessment: 1.  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 - Status post right upper lobectomy in February 2012 for right lung cancer. - LDCT chest (04/14/2022): Enlarging spiculated nodular soft tissue thickening in the posterior left upper lobe measuring 11.4 mm. - PET scan (04/24/2022): Left upper lobe lung nodule 14 mm with SUV 3.62.  Hypermetabolic focus in the inferior aspect of the right scapula without discrete CT correlate with SUV 5.71.  No hypermetabolic adenopathy. - LUL lesion FNA (06/03/2022): Adenocarcinoma - Right scapula biopsy (07/03/2022) adenocarcinoma consistent with lung primary - NGS: PD-L1 22 C3 TPS 100%, T p53 pathogenic variant, MSI-stable, TMB-low, no other targetable mutations - Cycle 1 of pembrolizumab  started on 07/21/2022 - XRT to the right scapular lesion and left upper lobe lung lesion 5 fractions completed on 08/18/2022.   2.  Cystic lesion of left kidney - CTA PE on 04/14/2022 showed complex cystic lesion of the left kidney.  PET scan also showed exophytic 18 mm left renal lesion suspicious for malignancy. - MRI abdomen on 05/16/2022 showed benign appearing Bosniak category 1 and 2 left renal cyst with no evidence of neoplasm.  No further work-up needed.    Plan: 1.  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 - He reported right chest wall pain and cough with expectoration 2 weeks ago.  He felt like  he had flu symptoms. - Reviewed CT chest from 10/06/2023: New heterogeneous opacities in both lungs, right more than left consistent with multilobar pneumonia/pneumonitis. - He was prescribed amoxicillin  and Medrol  Dosepak.  He developed rash after using amoxicillin .  Dr. Fusco has given him Z-Pak.  He has 2 more doses of Medrol  Dosepak and Z-Pak. - Reports improvement in his symptoms.  O2 sats today on room air are 97%.  Will hold Keytruda .  RTC 11/09/2022 for possible treatment.   2.  Left posterior chest wall rash: - Continue prednisone  5 mg daily and clobetasol dipropionate ointment twice daily as needed.   3.  B12 deficiency - Continue B12 every other day and iron tablet daily.   4.  Hypokalemia: - Continue potassium 10 mEq twice daily.  Potassium is 3.2 today.    Orders Placed This Encounter  Procedures   Magnesium     Standing Status:   Future    Expected Date:   11/09/2023    Expiration Date:   11/08/2024   CBC with Differential    Standing Status:   Future    Expected Date:   11/09/2023    Expiration Date:   11/08/2024   Comprehensive metabolic panel    Standing Status:   Future    Expected Date:   11/09/2023    Expiration Date:   11/08/2024      I,Katie Daubenspeck,acting as a scribe for Paulett Boros, MD.,have documented all relevant documentation on the behalf of Paulett Boros, MD,as directed by  Paulett Boros, MD while in the presence of Paulett Boros, MD.   I, Dakin Madani  MD, have reviewed the above documentation for accuracy and completeness, and I agree with the above.   Paulett Boros, MD   2/10/20252:07 PM  CHIEF COMPLAINT:   Diagnosis: bilateral lung cancers, normocytic anemia, and B12 deficiency    Cancer Staging  Adenocarcinoma of lung (HCC) Staging form: Lung, AJCC 7th Edition - Clinical: Stage IA (T1a, N0, M0) - Signed by Maurice Gant, PA on 05/06/2011  Adenocarcinoma of upper lobe of left lung [facility]) Staging form:  Lung, AJCC 8th Edition - Clinical stage from 07/16/2022: Stage IVA (cT1b, cN0, pM1b) - Unsigned    Prior Therapy: 1. Right upper lobectomy (February 2012) for right lung cancer 2. SBRT (November 2015) for stage I left adenocarcinoma  Current Therapy:  Pembrolizumab     HISTORY OF PRESENT ILLNESS:   Oncology History  Adenocarcinoma of lung (HCC)  09/30/2010 Cancer Staging   CT of chest showed spiculated lesion   10/15/2010 Surgery   Right upper lobectomy by Dr. Percy Bracken   10/16/2010 Remission     10/24/2013 Imaging   CT Chest-  Enlarging subpleural nodule in the left lower lobe, now 9 mm, with distortion of the overlying pleura. Finding is concerning for a small bronchogenic carcinoma   01/23/2014 Imaging   CT Chest- Left lower lobe nodule has enlarged from 11/01/2012 and is worrisome for primary bronchogenic carcinoma.    07/04/2014 - 07/10/2014 Radiation Therapy   SBRT by Dr. Lurena Sally in 3 fractions.   07/05/2015 Imaging   perifissural nodule in LLL appears slightly smaller with parenchymal opacity attrib to XRT, likely inflamm pathcy airspace dz more inferiorly in LLL, airspace dz in RLL resolved. no metastatic disease   Primary cancer of left lower lobe of lung (HCC)  06/18/2014 Initial Diagnosis   Left lower lobe nodule suspicious for primary lung carcinoma   07/04/2014 - 07/10/2014 Radiation Therapy   SBRT   10/12/2014 Imaging   No acute findings within the chest. Decrease in size of left lower lobe perifissural nodule compatible with response to therapy. No new or progressive disease identified within the chest.   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)  07/16/2022 Initial Diagnosis   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)   07/21/2022 -  Chemotherapy   Patient is on Treatment Plan : LUNG NSCLC Pembrolizumab  (200) q21d        INTERVAL HISTORY:   Maurice Orozco is a 78 y.o. male presenting to clinic today for follow up of bilateral lung cancers, normocytic anemia, and  B12 deficiency. He was last seen by me on 08/31/23.  Since his last visit, he underwent restaging chest CT on 10/06/23 showing: new heterogeneous opacities throughout bilateral lungs, concerning for pneumonia; stable postsurgical/posttreatment changes and fibrosis/scarring.  Today, he states that he is doing well overall. His appetite level is at 100%. His energy level is at 75%.  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 dense RCA calcification --Non-ischemic Myoview    History of kidney stones    in the 70's   History of radiation therapy 07/04/14, 07/06/14, 07/10/14   LLL lung nodule/54 Gy/3 fx- SBRT   Hypercholesterolemia 05/06/2011   Pneumonia due to Streptococcus    HX. POST OPERATIVELY   Port-A-Cath in place 07/21/2022   Pre-diabetes    Stress fx pelvis    DUE TO AA IN 1970    Surgical History: Past Surgical History:  Procedure Laterality Date   BACK SURGERY  07/04/2017   fusion above previous surgery   BACK SURGERY  2011   metal plate U-9,W1   BRONCHIAL BIOPSY  06/03/2022   Procedure: BRONCHIAL BIOPSIES;  Surgeon: Prudy Brownie, DO;  Location: [facility];  Service: Pulmonary;;   BRONCHIAL BRUSHINGS  06/03/2022   Procedure: BRONCHIAL BRUSHINGS;  Surgeon: Prudy Brownie, DO;  Location: [facility];  Service: Pulmonary;;   BRONCHIAL NEEDLE ASPIRATION BIOPSY  06/03/2022   Procedure: BRONCHIAL NEEDLE ASPIRATION BIOPSIES;  Surgeon: Prudy Brownie, DO;   Location: [facility];  Service: Pulmonary;;   Coroanry Calcium  Score  03/2017   1043. Noted especially in RCA. Dense calcification, recommend Myoview  over Cor CTA.   FIDUCIAL MARKER PLACEMENT  06/03/2022   Procedure: FIDUCIAL MARKER PLACEMENT;  Surgeon: Prudy Brownie, DO;  Location: [facility];  Service: Pulmonary;;   IR IMAGING GUIDED PORT INSERTION  08/04/2022   LIPOMA EXCISION N/A 10/14/2017   Procedure: EXCISION LIPOMA ON BACK;  Surgeon: Awilda Bogus, MD;  Location: [facility];  Service: General;  Laterality: N/A;   LIPOMA EXCISION Left 11/19/2018   Procedure: MINOR EXCISION OF SEBACEOUS CYST NECK;  Surgeon: Awilda Bogus, MD;  Location: [facility];  Service: General;  Laterality: Left;  pt knows to arrive at 7:00   LUNG LOBECTOMY  2012   RT UPPER LOBE   NM MYOVIEW  LTD  04/2017   EF 60%. Hypertensive response to exercise. (6:21 min, 7.7 METs) No EKG changes. LOW RISK    Social History: Social History   Socioeconomic History   Marital status: Married    Spouse name: Not on file   Number of children: 3   Years of education: Not on file   Highest education level: Not on file  Occupational History   Not on file  Tobacco Use   Smoking status: Former    Current packs/day: 0.00    Average packs/day: 1.5 packs/day for 59.0 years (88.5 ttl pk-yrs)    Types: Cigarettes    Start date: 09/09/1951    Quit date: 09/08/2010    Years since quitting: 13.1   Smokeless tobacco: Current    Types: Chew   Tobacco comments:    still chews tobacco, he tells me he hasn't in the past 2 days.   Vaping Use   Vaping status: Never Used  Substance and Sexual Activity   Alcohol use: No   Drug use: No   Sexual activity: Not Currently  Other Topics Concern   Not on file  Social History Narrative   Not on file   Social Drivers of Health   Financial Resource Strain: Not on file  Food Insecurity: Not on file  Transportation Needs: No Transportation Needs (07/13/2018)   PRAPARE - Therapist, art (Medical): No    Lack of Transportation (Non-Medical): No  Physical Activity: Not on file  Stress: Not on file  Social Connections: Not on file  Intimate Partner Violence:  Not At Risk (07/13/2018)   Humiliation, Afraid, Rape, and Kick questionnaire    Fear of Current or Ex-Partner: No    Emotionally Abused: No    Physically Abused: No    Sexually Abused: No    Family History: Family History  Problem Relation Age of Onset   Hypertension Mother    Kidney disease Father    Heart attack Brother    Cancer Maternal Uncle        prostate cancer   Cancer Maternal Uncle        prostate cancer   Cancer Maternal Uncle        prostate cancer   Cancer Maternal Uncle        prostate cancer    Current Medications:  Current Outpatient Medications:    albuterol  (PROVENTIL  HFA;VENTOLIN  HFA) 108 (90 Base) MCG/ACT inhaler, Inhale 2 puffs into the lungs every 4 (four) hours as needed for wheezing or shortness of breath., Disp: 2 Inhaler, Rfl: 3   albuterol  (PROVENTIL ) (2.5 MG/3ML) 0.083% nebulizer solution, Take 2.5 mg by nebulization every 6 (six) hours as needed for wheezing or shortness of breath., Disp: , Rfl:    azithromycin  (ZITHROMAX ) 250 MG tablet, Take by mouth., Disp: , Rfl:    betamethasone  dipropionate 0.05 % cream, APPLY TOPICALLY TWICE A DAY, Disp: 30 g, Rfl: 1   cetirizine (ZYRTEC) 10 MG tablet, Take 10 mg by mouth daily., Disp: , Rfl:    cholecalciferol  (VITAMIN D3) 25 MCG (1000 UNIT) tablet, Take 1,000 Units by mouth daily., Disp: , Rfl:    cimetidine (TAGAMET) 200 MG tablet, Take 400-600 mg by mouth daily as needed (for heartburn)., Disp: , Rfl:    clobetasol ointment (TEMOVATE) 0.05 %, Apply 1 Application topically daily as needed (itching)., Disp: , Rfl:    ferrous sulfate 325 (65 FE) MG tablet, Take 325 mg by mouth daily with breakfast., Disp: , Rfl:    fluticasone  (FLONASE ) 50 MCG/ACT nasal spray, Place 2 sprays into both nostrils daily as needed  for rhinitis., Disp: , Rfl:    Glycopyrrolate-Formoterol (BEVESPI AEROSPHERE) 9-4.8 MCG/ACT AERO, Inhale 2 puffs into the lungs 2 (two) times daily., Disp: , Rfl:    guaiFENesin  (MUCINEX ) 600 MG 12 hr tablet, Take 600 mg by mouth daily., Disp: , Rfl:    hydrochlorothiazide (HYDRODIURIL) 25 MG tablet, Take 25 mg by mouth daily., Disp: , Rfl:    HYDROcodone -acetaminophen  (NORCO) 10-325 MG tablet, Take 1-2 tablets by mouth every 4 (four) hours., Disp: , Rfl:    hydrOXYzine  (VISTARIL ) 25 MG capsule, Take 25-50 mg by mouth every 6 (six) hours as needed for itching., Disp: , Rfl:    KLOR-CON  M10 10 MEQ tablet, Take 20 mEq by mouth daily., Disp: , Rfl:    lidocaine -prilocaine  (EMLA ) cream, Apply a small amount to port a cath site and cover with plastic wrap 1 hour prior to infusion appointments, Disp: 30 g, Rfl: 3   methylPREDNISolone  (MEDROL  DOSEPAK) 4 MG TBPK tablet, Take 6 tablets (24 mg total) by mouth daily for 1 day, THEN 5 tablets (20 mg total) daily for 1 day, THEN 4 tablets (16 mg total) daily for 1 day, THEN 2 tablets (8 mg total) daily for 1 day, THEN 1 tablet (4 mg total) daily for 1 day., Disp: 18 tablet, Rfl: 0   morphine  (MS CONTIN ) 30 MG 12 hr tablet, SMARTSIG:1 Tablet(s) By Mouth Every 12 Hours, Disp: , Rfl:    naproxen sodium (ALEVE) 220 MG tablet, Take 220 mg by  mouth daily as needed (pain)., Disp: , Rfl:    Omega-3 Fatty Acids (FISH OIL) 1000 MG CAPS, Take 1,000 mg by mouth daily., Disp: , Rfl:    ondansetron  (ZOFRAN ) 4 MG tablet, Take 1 tablet (4 mg total) by mouth every 8 (eight) hours as needed., Disp: 30 tablet, Rfl: 1   oxyCODONE  (ROXICODONE ) 5 MG immediate release tablet, Take 1 tablet (5 mg total) by mouth every 4 (four) hours as needed for severe pain (pain score 7-10) or breakthrough pain., Disp: 10 tablet, Rfl: 0   pantoprazole  (PROTONIX ) 40 MG tablet, Take 40 mg by mouth See admin instructions. Take 40 mg daily, may take a second 40 mg dose as needed for heartburn, Disp: , Rfl:     PEMBROLIZUMAB  IV, Inject into the vein every 21 ( twenty-one) days., Disp: , Rfl:    polyethylene glycol powder (GLYCOLAX/MIRALAX) 17 GM/SCOOP powder, Take 17 g by mouth daily as needed for mild constipation or moderate constipation., Disp: , Rfl:    predniSONE  (DELTASONE ) 5 MG tablet, TAKE 1 TABLET BY MOUTH EVERY DAY WITH BREAKFAST, Disp: 30 tablet, Rfl: 4   prochlorperazine  (COMPAZINE ) 10 MG tablet, Take 1 tablet (10 mg total) by mouth every 6 (six) hours as needed for nausea or vomiting., Disp: 30 tablet, Rfl: 1   rosuvastatin  (CRESTOR ) 40 MG tablet, Take 40 mg by mouth at bedtime., Disp: , Rfl:    tamsulosin  (FLOMAX ) 0.4 MG CAPS capsule, TAKE 1 CAPSULE BY MOUTH EVERY DAY, Disp: 90 capsule, Rfl: 1   triamcinolone  ointment (KENALOG ) 0.1 %, Apply topically 2 (two) times daily as needed., Disp: , Rfl:    triazolam (HALCION) 0.25 MG tablet, Take 0.25 mg by mouth at bedtime., Disp: , Rfl:  No current facility-administered medications for this visit.  Facility-Administered Medications Ordered in Other Visits:    sodium chloride  flush (NS) 0.9 % injection 10 mL, 10 mL, Intracatheter, PRN, Tramon Crescenzo, MD, 10 mL at 10/07/22 1523   sodium chloride  flush (NS) 0.9 % injection 10 mL, 10 mL, Intracatheter, PRN, Acadia Thammavong, MD, 10 mL at 10/28/22 1446   sodium chloride  flush (NS) 0.9 % injection 10 mL, 10 mL, Intracatheter, PRN, Hiroshi Krummel, MD, 10 mL at 11/19/22 1547   sodium chloride  flush (NS) 0.9 % injection 10 mL, 10 mL, Intravenous, PRN, Cheyenne Schumm, MD, 10 mL at 10/19/23 1042   Allergies: Allergies  Allergen Reactions   Fentanyl  Other (See Comments)    CAUSED HALLUCINATIONS/05-03-13 pt reports it was oral fentanyl  that caused hallucinations    Gabapentin Other (See Comments)    Bones throbbed, headaches, weakness   Nabumetone     Unknown reaction    Amoxicillin  Rash   Aspirin Nausea Only   Ciprofloxacin Hcl Rash    REVIEW OF SYSTEMS:   Review of  Systems  Constitutional:  Negative for chills, fatigue and fever.  HENT:   Negative for lump/mass, mouth sores, nosebleeds, sore throat and trouble swallowing.   Eyes:  Negative for eye problems.  Respiratory:  Positive for cough and shortness of breath.   Cardiovascular:  Negative for chest pain, leg swelling and palpitations.  Gastrointestinal:  Positive for nausea. Negative for abdominal pain, constipation, diarrhea and vomiting.  Genitourinary:  Negative for bladder incontinence, difficulty urinating, dysuria, frequency, hematuria and nocturia.   Musculoskeletal:  Positive for back pain. Negative for arthralgias, flank pain, myalgias and neck pain.  Skin:  Negative for itching and rash.  Neurological:  Negative for dizziness, headaches and numbness.  Hematological:  Does not bruise/bleed easily.  Psychiatric/Behavioral:  Negative for depression, sleep disturbance and suicidal ideas. The patient is not nervous/anxious.   All other systems reviewed and are negative.    VITALS:   Blood pressure (!) 162/79, pulse 85, temperature 97.7 F (36.5 C), temperature source Oral, resp. rate 18, weight 136 lb 11 oz (62 kg), SpO2 97%.  Wt Readings from Last 3 Encounters:  10/19/23 136 lb 11 oz (62 kg)  09/21/23 138 lb (62.6 kg)  08/31/23 135 lb 3.2 oz (61.3 kg)    Body mass index is 22.06 kg/m.  Performance status (ECOG): 1 - Symptomatic but completely ambulatory  PHYSICAL EXAM:   Physical Exam Vitals and nursing note reviewed. Exam conducted with a chaperone present.  Constitutional:      Appearance: Normal appearance.  Cardiovascular:     Rate and Rhythm: Normal rate and regular rhythm.     Pulses: Normal pulses.     Heart sounds: Normal heart sounds.  Pulmonary:     Effort: Pulmonary effort is normal.     Breath sounds: Normal breath sounds.  Abdominal:     Palpations: Abdomen is soft. There is no hepatomegaly, splenomegaly or mass.     Tenderness: There is no abdominal  tenderness.  Musculoskeletal:     Right lower leg: No edema.     Left lower leg: No edema.  Lymphadenopathy:     Cervical: No cervical adenopathy.     Right cervical: No superficial, deep or posterior cervical adenopathy.    Left cervical: No superficial, deep or posterior cervical adenopathy.     Upper Body:     Right upper body: No supraclavicular or axillary adenopathy.     Left upper body: No supraclavicular or axillary adenopathy.  Neurological:     General: No focal deficit present.     Mental Status: He is alert and oriented to person, place, and time.  Psychiatric:        Mood and Affect: Mood normal.        Behavior: Behavior normal.     LABS:   CBC     Component Value Date/Time   WBC 8.2 10/19/2023 0907   RBC 3.92 (L) 10/19/2023 0907   HGB 13.2 10/19/2023 0907   HGB 13.5 05/03/2023 1601   HCT 39.5 10/19/2023 0907   HCT 39.8 05/03/2023 1601   PLT 396 10/19/2023 0907   PLT 266 05/03/2023 1601   MCV 100.8 (H) 10/19/2023 0907   MCV 98 (H) 05/03/2023 1601   MCH 33.7 10/19/2023 0907   MCHC 33.4 10/19/2023 0907   RDW 13.2 10/19/2023 0907   RDW 11.6 05/03/2023 1601   LYMPHSABS 1.2 10/19/2023 0907   LYMPHSABS 0.5 (L) 05/03/2023 1601   MONOABS 0.7 10/19/2023 0907   EOSABS 0.1 10/19/2023 0907   EOSABS 0.1 05/03/2023 1601   BASOSABS 0.1 10/19/2023 0907   BASOSABS 0.1 05/03/2023 1601    CMP      Component Value Date/Time   NA 137 10/19/2023 0907   NA 136 05/03/2023 1601   K 3.2 (L) 10/19/2023 0907   CL 102 10/19/2023 0907   CO2 24 10/19/2023 0907   GLUCOSE 103 (H) 10/19/2023 0907   BUN 16 10/19/2023 0907   BUN 18 05/03/2023 1601   BUN 23.9 07/05/2015 1239   CREATININE 0.74 10/19/2023 0907   CREATININE 0.9 07/05/2015 1239   CALCIUM  9.1 10/19/2023 0907   PROT 6.6 10/19/2023 0907   PROT 6.5 05/03/2023 1601   ALBUMIN 3.6 10/19/2023 0907  ALBUMIN 4.3 05/03/2023 1601   AST 20 10/19/2023 0907   ALT 26 10/19/2023 0907   ALKPHOS 55 10/19/2023 0907    BILITOT 0.6 10/19/2023 0907   BILITOT 0.2 05/03/2023 1601   GFRNONAA >60 10/19/2023 0907   GFRAA >60 05/29/2020 1407     No results found for: "CEA1", "CEA" / No results found for: "CEA1", "CEA" No results found for: "PSA1" No results found for: "AOZ308" No results found for: "CAN125"  No results found for: "TOTALPROTELP", "ALBUMINELP", "A1GS", "A2GS", "BETS", "BETA2SER", "GAMS", "MSPIKE", "SPEI" Lab Results  Component Value Date   TIBC 310 09/30/2022   TIBC 323 04/14/2022   TIBC 373 10/01/2021   FERRITIN 108 09/30/2022   FERRITIN 65 04/14/2022   FERRITIN 49 10/01/2021   IRONPCTSAT 13 (L) 09/30/2022   IRONPCTSAT 22 04/14/2022   IRONPCTSAT 27 10/01/2021   Lab Results  Component Value Date   LDH 120 06/28/2021   LDH 130 12/20/2020   LDH 97 (L) 07/12/2018     STUDIES:   CT CHEST W CONTRAST Result Date: 10/14/2023 CLINICAL DATA:  Non-small cell lung cancer (NSCLC), monitor. * Tracking Code: BO * EXAM: CT CHEST WITH CONTRAST TECHNIQUE: Multidetector CT imaging of the chest was performed during intravenous contrast administration. RADIATION DOSE REDUCTION: This exam was performed according to the departmental dose-optimization program which includes automated exposure control, adjustment of the mA and/or kV according to patient size and/or use of iterative reconstruction technique. CONTRAST:  75mL OMNIPAQUE  IOHEXOL  300 MG/ML  SOLN COMPARISON:  CT scan chest from 06/11/2023. FINDINGS: Cardiovascular: Normal cardiac size. No pericardial effusion. No aortic aneurysm. There are coronary artery calcifications, in keeping with coronary artery disease. There are also moderate peripheral atherosclerotic vascular calcifications of thoracic aorta and its major branches. Mediastinum/Nodes: Visualized thyroid  gland appears grossly unremarkable. No solid / cystic mediastinal masses. The esophagus is nondistended precluding optimal assessment. There is a small sliding hiatal hernia. There are few  mildly prominent mediastinal and hilar lymph nodes, which do not meet the size criteria for lymphadenopathy but appear grossly similar to the prior study. No axillary lymphadenopathy by size criteria. Lungs/Pleura: The central tracheo-bronchial tree is patent. Patient is status post right upper lobectomy. Redemonstration of focal scarring with adjacent fiducial markers in the left upper lobe, apicoposterior segment, unchanged. There is linear opacity along the left major fissure (series 3, image 93), which is also unchanged. There is complete resolution of previously noted patchy opacity in the lingula. Redemonstration of moderate upper lobe predominant emphysematous changes. However, since the prior study, there are new heterogeneous opacities predominantly in the right lung lower lobe. There also heterogeneous opacities in the left lung lower lobe and minimal opacities in the inferior lingula. Findings are highly concerning for multilobar pneumonia. Follow-up to clearing is recommended. No pleural effusion or pneumothorax. Upper Abdomen: Visualized upper abdominal viscera within normal limits. Musculoskeletal: A CT Port-a-Cath is seen in the right upper chest wall with the catheter terminating in the cavo-atrial junction region. Visualized soft tissues of the chest wall are otherwise grossly unremarkable. No suspicious osseous lesions. There are mild multilevel degenerative changes in the visualized spine. Redemonstration of moderate anterior wedging deformity of T7 vertebral body, similar to the prior study. However, there is interval resolution of previously seen sclerosis. No new acute vertebral compression fracture seen. Focal resection of right seventh rib noted. Old healed deformity of inferior right scapula noted. IMPRESSION: 1. Since the prior study, there are new heterogeneous opacities throughout bilateral lungs with asymmetric more involvement  of right lung lower lobe, highly concerning for multilobar  pneumonia. Follow-up to clearing is recommended. 2. Multiple other stable postsurgical changes/posttreatment changes and fibrosis/scarring, as described above. 3. There is complete resolution of previously noted patchy opacity in the lingula, suggesting resolution of infection. 4. Multiple other nonacute observations, as described above. Aortic Atherosclerosis (ICD10-I70.0) and Emphysema (ICD10-J43.9). Electronically Signed   By: Beula Brunswick M.D.   On: 10/14/2023 10:40

## 2023-10-19 ENCOUNTER — Inpatient Hospital Stay: Payer: Medicare Other | Attending: Physician Assistant

## 2023-10-19 ENCOUNTER — Inpatient Hospital Stay (HOSPITAL_BASED_OUTPATIENT_CLINIC_OR_DEPARTMENT_OTHER): Payer: Medicare Other | Attending: Physician Assistant | Admitting: Hematology

## 2023-10-19 ENCOUNTER — Inpatient Hospital Stay: Payer: Medicare Other

## 2023-10-19 VITALS — BP 162/79 | HR 85 | Temp 97.7°F | Resp 18 | Wt 136.7 lb

## 2023-10-19 DIAGNOSIS — Z5112 Encounter for antineoplastic immunotherapy: Secondary | ICD-10-CM | POA: Diagnosis not present

## 2023-10-19 DIAGNOSIS — Z95828 Presence of other vascular implants and grafts: Secondary | ICD-10-CM

## 2023-10-19 DIAGNOSIS — C3412 Malignant neoplasm of upper lobe, left bronchus or lung: Secondary | ICD-10-CM | POA: Diagnosis not present

## 2023-10-19 DIAGNOSIS — E538 Deficiency of other specified B group vitamins: Secondary | ICD-10-CM | POA: Insufficient documentation

## 2023-10-19 DIAGNOSIS — Z87891 Personal history of nicotine dependence: Secondary | ICD-10-CM | POA: Diagnosis not present

## 2023-10-19 DIAGNOSIS — E876 Hypokalemia: Secondary | ICD-10-CM | POA: Insufficient documentation

## 2023-10-19 LAB — CBC WITH DIFFERENTIAL/PLATELET
Abs Immature Granulocytes: 0.18 10*3/uL — ABNORMAL HIGH (ref 0.00–0.07)
Basophils Absolute: 0.1 10*3/uL (ref 0.0–0.1)
Basophils Relative: 1 %
Eosinophils Absolute: 0.1 10*3/uL (ref 0.0–0.5)
Eosinophils Relative: 1 %
HCT: 39.5 % (ref 39.0–52.0)
Hemoglobin: 13.2 g/dL (ref 13.0–17.0)
Immature Granulocytes: 2 %
Lymphocytes Relative: 15 %
Lymphs Abs: 1.2 10*3/uL (ref 0.7–4.0)
MCH: 33.7 pg (ref 26.0–34.0)
MCHC: 33.4 g/dL (ref 30.0–36.0)
MCV: 100.8 fL — ABNORMAL HIGH (ref 80.0–100.0)
Monocytes Absolute: 0.7 10*3/uL (ref 0.1–1.0)
Monocytes Relative: 8 %
Neutro Abs: 6 10*3/uL (ref 1.7–7.7)
Neutrophils Relative %: 73 %
Platelets: 396 10*3/uL (ref 150–400)
RBC: 3.92 MIL/uL — ABNORMAL LOW (ref 4.22–5.81)
RDW: 13.2 % (ref 11.5–15.5)
WBC: 8.2 10*3/uL (ref 4.0–10.5)
nRBC: 0 % (ref 0.0–0.2)

## 2023-10-19 LAB — COMPREHENSIVE METABOLIC PANEL
ALT: 26 U/L (ref 0–44)
AST: 20 U/L (ref 15–41)
Albumin: 3.6 g/dL (ref 3.5–5.0)
Alkaline Phosphatase: 55 U/L (ref 38–126)
Anion gap: 11 (ref 5–15)
BUN: 16 mg/dL (ref 8–23)
CO2: 24 mmol/L (ref 22–32)
Calcium: 9.1 mg/dL (ref 8.9–10.3)
Chloride: 102 mmol/L (ref 98–111)
Creatinine, Ser: 0.74 mg/dL (ref 0.61–1.24)
GFR, Estimated: >60 mL/min (ref >60)
Glucose, Bld: 103 mg/dL — ABNORMAL HIGH (ref 70–99)
Potassium: 3.2 mmol/L — ABNORMAL LOW (ref 3.5–5.1)
Sodium: 137 mmol/L (ref 135–145)
Total Bilirubin: 0.6 mg/dL (ref 0.0–1.2)
Total Protein: 6.6 g/dL (ref 6.5–8.1)

## 2023-10-19 LAB — MAGNESIUM: Magnesium: 1.9 mg/dL (ref 1.7–2.4)

## 2023-10-19 MED ORDER — HEPARIN SOD (PORK) LOCK FLUSH 100 UNIT/ML IV SOLN
500.0000 [IU] | Freq: Once | INTRAVENOUS | Status: AC
  Administered 2023-10-19: 500 [IU] via INTRAVENOUS

## 2023-10-19 MED ORDER — SODIUM CHLORIDE 0.9% FLUSH
10.0000 mL | INTRAVENOUS | Status: DC | PRN
Start: 2023-10-19 — End: 2023-10-19
  Administered 2023-10-19: 10 mL via INTRAVENOUS

## 2023-10-19 MED ORDER — SODIUM CHLORIDE 0.9% FLUSH
10.0000 mL | Freq: Once | INTRAVENOUS | Status: AC
  Administered 2023-10-19: 10 mL via INTRAVENOUS

## 2023-10-19 NOTE — Progress Notes (Signed)
 Hold treatment today per Dr. Katragadda due to pneumonitis.  Patient's port de-accessed and patient left ambulatory with wife in stable condition.

## 2023-10-19 NOTE — Patient Instructions (Addendum)
 [facility] at [facility] Discharge Instructions   You were seen and examined today by Dr. Cheree Cords.  He reviewed the results of your lab work which are normal/stable.   He reviewed the results of your CT scan. It is showing inflammation in your right lung. There is no evidence of cancer.   We will hold your treatment today.   Return as scheduled.    Thank you for choosing [facility] at [facility] to provide your oncology and hematology care.  To afford each patient quality time with our provider, please arrive at least 15 minutes before your scheduled appointment time.   If you have a lab appointment with the Cancer Center please come in thru the Main Entrance and check in at the main information desk.  You need to re-schedule your appointment should you arrive 10 or more minutes late.  We strive to give you quality time with our providers, and arriving late affects you and other patients whose appointments are after yours.  Also, if you no show three or more times for appointments you may be dismissed from the clinic at the providers discretion.     Again, thank you for choosing [facility].  Our hope is that these requests will decrease the amount of time that you wait before being seen by our physicians.       _____________________________________________________________  Should you have questions after your visit to [facility], please contact our office at [phone] and follow the prompts.  Our office hours are 8:00 a.m. and 4:30 p.m. Monday - Friday.  Please note that voicemails left after 4:00 p.m. may not be returned until the following business day.  We are closed weekends and major holidays.  You do have access to a nurse 24-7, just call the main number to the clinic [phone] and do not press any options, hold on the line and a nurse will answer the phone.    For prescription refill requests, have  your pharmacy contact our office and allow 72 hours.    Due to Covid, you will need to wear a mask upon entering the hospital. If you do not have a mask, a mask will be given to you at the Main Entrance upon arrival. For doctor visits, patients may have 1 support person age 68 or older with them. For treatment visits, patients can not have anyone with them due to social distancing guidelines and our immunocompromised population.

## 2023-10-19 NOTE — Addendum Note (Signed)
 Addended by: Augie Bliss E on: 10/19/2023 01:11 PM   Modules accepted: Orders

## 2023-11-02 ENCOUNTER — Inpatient Hospital Stay: Payer: Medicare Other | Admitting: Hematology

## 2023-11-02 ENCOUNTER — Inpatient Hospital Stay: Payer: Medicare Other

## 2023-11-06 DIAGNOSIS — C3432 Malignant neoplasm of lower lobe, left bronchus or lung: Secondary | ICD-10-CM | POA: Diagnosis not present

## 2023-11-06 DIAGNOSIS — I10 Essential (primary) hypertension: Secondary | ICD-10-CM | POA: Diagnosis not present

## 2023-11-06 DIAGNOSIS — J449 Chronic obstructive pulmonary disease, unspecified: Secondary | ICD-10-CM | POA: Diagnosis not present

## 2023-11-09 ENCOUNTER — Inpatient Hospital Stay: Payer: Medicare Other

## 2023-11-11 ENCOUNTER — Inpatient Hospital Stay: Payer: Medicare Other

## 2023-11-11 ENCOUNTER — Inpatient Hospital Stay: Payer: Medicare Other | Admitting: Hematology

## 2023-11-12 DIAGNOSIS — M48061 Spinal stenosis, lumbar region without neurogenic claudication: Secondary | ICD-10-CM | POA: Diagnosis not present

## 2023-11-12 DIAGNOSIS — G8929 Other chronic pain: Secondary | ICD-10-CM | POA: Diagnosis not present

## 2023-11-12 DIAGNOSIS — J441 Chronic obstructive pulmonary disease with (acute) exacerbation: Secondary | ICD-10-CM | POA: Diagnosis not present

## 2023-11-12 DIAGNOSIS — M5134 Other intervertebral disc degeneration, thoracic region: Secondary | ICD-10-CM | POA: Diagnosis not present

## 2023-11-12 DIAGNOSIS — J449 Chronic obstructive pulmonary disease, unspecified: Secondary | ICD-10-CM | POA: Diagnosis not present

## 2023-11-12 DIAGNOSIS — C3432 Malignant neoplasm of lower lobe, left bronchus or lung: Secondary | ICD-10-CM | POA: Diagnosis not present

## 2023-11-12 DIAGNOSIS — M546 Pain in thoracic spine: Secondary | ICD-10-CM | POA: Diagnosis not present

## 2023-11-12 DIAGNOSIS — I10 Essential (primary) hypertension: Secondary | ICD-10-CM | POA: Diagnosis not present

## 2023-11-17 NOTE — Progress Notes (Signed)
 [facility] [address], [place] [address]    Clinic Day:  11/18/2023  Referring physician: Elfredia Nevins, MD  Patient Care Team: Elfredia Nevins, MD as PCP - General (Internal Medicine) Marykay Lex, MD as PCP - Cardiology (Cardiology) Malissa Hippo, MD (Inactive) as Consulting Physician (Gastroenterology) Lonie Peak, MD as Attending Physician (Radiation Oncology)   ASSESSMENT & PLAN:   Assessment: 1.  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 - Status post right upper lobectomy in February 2012 for right lung cancer. - LDCT chest (04/14/2022): Enlarging spiculated nodular soft tissue thickening in the posterior left upper lobe measuring 11.4 mm. - PET scan (04/24/2022): Left upper lobe lung nodule 14 mm with SUV 3.62.  Hypermetabolic focus in the inferior aspect of the right scapula without discrete CT correlate with SUV 5.71.  No hypermetabolic adenopathy. - LUL lesion FNA (06/03/2022): Adenocarcinoma - Right scapula biopsy (07/03/2022) adenocarcinoma consistent with lung primary - NGS: PD-L1 22 C3 TPS 100%, T p53 pathogenic variant, MSI-stable, TMB-low, no other targetable mutations - Cycle 1 of pembrolizumab started on 07/21/2022 - XRT to the right scapular lesion and left upper lobe lung lesion 5 fractions completed on 08/18/2022.   2.  Cystic lesion of left kidney - CTA PE on 04/14/2022 showed complex cystic lesion of the left kidney.  PET scan also showed exophytic 18 mm left renal lesion suspicious for malignancy. - MRI abdomen on 05/16/2022 showed benign appearing Bosniak category 1 and 2 left renal cyst with no evidence of neoplasm.  No further work-up needed.    Plan: 1.  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 - He reported clear expectoration.  He was seen by Dr. Sherwood Gambler and was started on Medrol  Dosepak, albuterol and Keflex on 11/13/2023. - He is currently taking prednisone taper. - Reviewed labs today: Normal LFTs.  CBC grossly normal.  TSH is normal. - Recommend holding Keytruda.  RTC 6 weeks with repeat CT chest with contrast and labs.   2.  Left posterior chest wall rash: - Continue prednisone 5 mg daily and steroid cream twice daily as needed.   3.  B12 deficiency - Continue B12 every other day and iron tablet daily.   4.  Hypokalemia: - Continue potassium 10 mill equivalents twice daily.  Potassium is 3.7.    Orders Placed This Encounter  Procedures   CT CHEST W CONTRAST    Standing Status:   Future    Expected Date:   12/19/2023    Expiration Date:   11/17/2024    If indicated for the ordered procedure, I authorize the administration of contrast media per Radiology protocol:   Yes    Does the patient have a contrast media/X-ray dye allergy?:   No    Preferred imaging location?:   [facility]      I,Katie Daubenspeck,acting as a scribe for Doreatha Massed, MD.,have documented all relevant documentation on the behalf of Doreatha Massed, MD,as directed by  Doreatha Massed, MD while in the presence of Doreatha Massed, MD.   I, Doreatha Massed MD, have reviewed the above documentation for accuracy and completeness, and I agree with the above.   Doreatha Massed, MD   3/12/20255:43 PM  CHIEF COMPLAINT:   Diagnosis: bilateral lung cancers, normocytic anemia, and B12 deficiency    Cancer Staging  Adenocarcinoma of lung (  HCC) Staging form: Lung, AJCC 7th Edition - Clinical: Stage IA (T1a, N0, M0) - Signed by [name], PA on 05/06/2011  Adenocarcinoma of upper lobe of left lung [facility]) Staging form: Lung, AJCC 8th Edition - Clinical stage from 07/16/2022: Stage IVA (cT1b, cN0, pM1b) - Unsigned    Prior Therapy: 1. Right upper lobectomy (February 2012) for right lung cancer 2. SBRT (November 2015) for stage I left  adenocarcinoma  Current Therapy:  Pembrolizumab    HISTORY OF PRESENT ILLNESS:   Oncology History  Adenocarcinoma of lung (HCC)  09/30/2010 Cancer Staging   CT of chest showed spiculated lesion   10/15/2010 Surgery   Right upper lobectomy by Dr. Edwyna Shell   10/16/2010 Remission     10/24/2013 Imaging   CT Chest-  Enlarging subpleural nodule in the left lower lobe, now 9 mm, with distortion of the overlying pleura. Finding is concerning for a small bronchogenic carcinoma   01/23/2014 Imaging   CT Chest- Left lower lobe nodule has enlarged from 11/01/2012 and is worrisome for primary bronchogenic carcinoma.    07/04/2014 - 07/10/2014 Radiation Therapy   SBRT by Dr. Basilio Cairo in 3 fractions.   07/05/2015 Imaging   perifissural nodule in LLL appears slightly smaller with parenchymal opacity attrib to XRT, likely inflamm pathcy airspace dz more inferiorly in LLL, airspace dz in RLL resolved. no metastatic disease   Primary cancer of left lower lobe of lung (HCC)  06/18/2014 Initial Diagnosis   Left lower lobe nodule suspicious for primary lung carcinoma   07/04/2014 - 07/10/2014 Radiation Therapy   SBRT   10/12/2014 Imaging   No acute findings within the chest. Decrease in size of left lower lobe perifissural nodule compatible with response to therapy. No new or progressive disease identified within the chest.   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)  07/16/2022 Initial Diagnosis   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)   07/21/2022 -  Chemotherapy   Patient is on Treatment Plan : LUNG NSCLC Pembrolizumab (200) q21d        INTERVAL HISTORY:   Maurice Orozco is a 78 y.o. male presenting to clinic today for follow up of bilateral lung cancers, normocytic anemia, and B12 deficiency. He was last seen by me on 10/19/23.  Today, he states that he is doing well overall. His appetite level is at 100%. His energy level is at 7 5%.  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 dense RCA calcification --Non-ischemic Myoview   History of kidney stones    in the 70's   History of radiation therapy 07/04/14, 07/06/14, 07/10/14   LLL lung nodule/54 Gy/3 fx- SBRT   Hypercholesterolemia 05/06/2011   Pneumonia due to Streptococcus    HX. POST OPERATIVELY   Port-A-Cath in place 07/21/2022   Pre-diabetes    Stress fx pelvis    DUE  TO AA IN 1970    Surgical History: Past Surgical History:  Procedure Laterality Date   BACK SURGERY  07/04/2017   fusion above previous surgery   BACK SURGERY  2011   metal plate W-0,J8   BRONCHIAL BIOPSY  06/03/2022   Procedure: BRONCHIAL BIOPSIES;  Surgeon: Josephine Igo, DO;  Location: [facility];  Service: Pulmonary;;   BRONCHIAL BRUSHINGS  06/03/2022   Procedure: BRONCHIAL BRUSHINGS;  Surgeon: Josephine Igo, DO;  Location: [facility];  Service: Pulmonary;;   BRONCHIAL NEEDLE ASPIRATION BIOPSY  06/03/2022   Procedure: BRONCHIAL NEEDLE ASPIRATION BIOPSIES;  Surgeon: Josephine Igo, DO;  Location: [facility];  Service: Pulmonary;;   Coroanry Calcium Score  03/2017   1043. Noted especially in RCA. Dense calcification, recommend Myoview over Cor CTA.   FIDUCIAL MARKER PLACEMENT  06/03/2022   Procedure: FIDUCIAL MARKER PLACEMENT;  Surgeon: Josephine Igo, DO;  Location: [facility];  Service: Pulmonary;;   IR IMAGING GUIDED PORT INSERTION  08/04/2022   LIPOMA EXCISION N/A 10/14/2017   Procedure: EXCISION LIPOMA ON BACK;   Surgeon: Lucretia Roers, MD;  Location: [facility];  Service: General;  Laterality: N/A;   LIPOMA EXCISION Left 11/19/2018   Procedure: MINOR EXCISION OF SEBACEOUS CYST NECK;  Surgeon: Lucretia Roers, MD;  Location: [facility];  Service: General;  Laterality: Left;  pt knows to arrive at 7:00   LUNG LOBECTOMY  2012   RT UPPER LOBE   NM MYOVIEW LTD  04/2017   EF 60%. Hypertensive response to exercise. (6:21 min, 7.7 METs) No EKG changes. LOW RISK    Social History: Social History   Socioeconomic History   Marital status: Married    Spouse name: Not on file   Number of children: 3   Years of education: Not on file   Highest education level: Not on file  Occupational History   Not on file  Tobacco Use   Smoking status: Former    Current packs/day: 0.00    Average packs/day: 1.5 packs/day for 59.0 years (88.5 ttl pk-yrs)    Types: Cigarettes    Start date: 09[birth year]09/1950    Quit date: 09/08/2010    Years since quitting: 13.2   Smokeless tobacco: Current    Types: Chew   Tobacco comments:    still chews tobacco, he tells me he hasn't in the past 2 days.   Vaping Use   Vaping status: Never Used  Substance and Sexual Activity   Alcohol use: No   Drug use: No   Sexual activity: Not Currently  Other Topics Concern   Not on file  Social History Narrative   Not on file   Social Drivers of Health   Financial Resource Strain: Not on file  Food Insecurity: Not on file  Transportation Needs: No Transportation Needs (07/13/2018)   PRAPARE - Administrator, Civil Service (Medical): No    Lack of Transportation (Non-Medical): No  Physical Activity: Not on file  Stress: Not on file  Social Connections: Not on file  Intimate Partner Violence: Not At Risk (07/13/2018)   Humiliation, Afraid, Rape, and Kick questionnaire    Fear of Current or Ex-Partner: No    Emotionally Abused: No    Physically Abused: No    Sexually Abused: No    Family History: Family History  Problem  Relation Age of Onset   Hypertension Mother    Kidney disease Father    Heart attack Brother    Cancer  Maternal Uncle        prostate cancer   Cancer Maternal Uncle        prostate cancer   Cancer Maternal Uncle        prostate cancer   Cancer Maternal Uncle        prostate cancer    Current Medications:  Current Outpatient Medications:    albuterol (PROVENTIL HFA;VENTOLIN HFA) 108 (90 Base) MCG/ACT inhaler, Inhale 2 puffs into the lungs every 4 (four) hours as needed for wheezing or shortness of breath., Disp: 2 Inhaler, Rfl: 3   azithromycin (ZITHROMAX) 250 MG tablet, Take by mouth., Disp: , Rfl:    betamethasone dipropionate 0.05 % cream, APPLY TOPICALLY TWICE A DAY, Disp: 30 g, Rfl: 1   cetirizine (ZYRTEC) 10 MG tablet, Take 10 mg by mouth daily., Disp: , Rfl:    cholecalciferol (VITAMIN D3) 25 MCG (1000 UNIT) tablet, Take 1,000 Units by mouth daily., Disp: , Rfl:    cimetidine (TAGAMET) 200 MG tablet, Take 400-600 mg by mouth daily as needed (for heartburn)., Disp: , Rfl:    clobetasol ointment (TEMOVATE) 0.05 %, Apply 1 Application topically daily as needed (itching)., Disp: , Rfl:    ferrous sulfate 325 (65 FE) MG tablet, Take 325 mg by mouth daily with breakfast., Disp: , Rfl:    fluticasone (FLONASE) 50 MCG/ACT nasal spray, Place 2 sprays into both nostrils daily as needed for rhinitis., Disp: , Rfl:    Glycopyrrolate-Formoterol (BEVESPI AEROSPHERE) 9-4.8 MCG/ACT AERO, Inhale 2 puffs into the lungs 2 (two) times daily., Disp: , Rfl:    guaiFENesin (MUCINEX) 600 MG 12 hr tablet, Take 600 mg by mouth daily., Disp: , Rfl:    hydrochlorothiazide (HYDRODIURIL) 25 MG tablet, Take 25 mg by mouth daily., Disp: , Rfl:    HYDROcodone-acetaminophen (NORCO) 10-325 MG tablet, Take 1-2 tablets by mouth every 4 (four) hours., Disp: , Rfl:    hydrOXYzine (VISTARIL) 25 MG capsule, Take 25-50 mg by mouth every 6 (six) hours as needed for itching., Disp: , Rfl:    KLOR-CON M10 10 MEQ tablet,  Take 20 mEq by mouth daily., Disp: , Rfl:    lidocaine-prilocaine (EMLA) cream, Apply a small amount to port a cath site and cover with plastic wrap 1 hour prior to infusion appointments, Disp: 30 g, Rfl: 3   morphine (MS CONTIN) 30 MG 12 hr tablet, SMARTSIG:1 Tablet(s) By Mouth Every 12 Hours, Disp: , Rfl:    naproxen sodium (ALEVE) 220 MG tablet, Take 220 mg by mouth daily as needed (pain)., Disp: , Rfl:    Omega-3 Fatty Acids (FISH OIL) 1000 MG CAPS, Take 1,000 mg by mouth daily., Disp: , Rfl:    ondansetron (ZOFRAN) 4 MG tablet, Take 1 tablet (4 mg total) by mouth every 8 (eight) hours as needed., Disp: 30 tablet, Rfl: 1   oxyCODONE (ROXICODONE) 5 MG immediate release tablet, Take 1 tablet (5 mg total) by mouth every 4 (four) hours as needed for severe pain (pain score 7-10) or breakthrough pain., Disp: 10 tablet, Rfl: 0   pantoprazole (PROTONIX) 40 MG tablet, Take 40 mg by mouth See admin instructions. Take 40 mg daily, may take a second 40 mg dose as needed for heartburn, Disp: , Rfl:    PEMBROLIZUMAB IV, Inject into the vein every 21 ( twenty-one) days., Disp: , Rfl:    polyethylene glycol powder (GLYCOLAX/MIRALAX) 17 GM/SCOOP powder, Take 17 g by mouth daily as needed for mild constipation or moderate constipation., Disp: ,  Rfl:    predniSONE (DELTASONE) 5 MG tablet, TAKE 1 TABLET BY MOUTH EVERY DAY WITH BREAKFAST, Disp: 30 tablet, Rfl: 4   prochlorperazine (COMPAZINE) 10 MG tablet, Take 1 tablet (10 mg total) by mouth every 6 (six) hours as needed for nausea or vomiting., Disp: 30 tablet, Rfl: 1   rosuvastatin (CRESTOR) 40 MG tablet, Take 40 mg by mouth at bedtime., Disp: , Rfl:    tamsulosin (FLOMAX) 0.4 MG CAPS capsule, TAKE 1 CAPSULE BY MOUTH EVERY DAY, Disp: 90 capsule, Rfl: 1   triamcinolone ointment (KENALOG) 0.1 %, Apply topically 2 (two) times daily as needed., Disp: , Rfl:    triazolam (HALCION) 0.25 MG tablet, Take 0.25 mg by mouth at bedtime., Disp: , Rfl:  No current  facility-administered medications for this visit.  Facility-Administered Medications Ordered in Other Visits:    sodium chloride flush (NS) 0.9 % injection 10 mL, 10 mL, Intracatheter, PRN, Doreatha Massed, MD, 10 mL at 10/07/22 1523   sodium chloride flush (NS) 0.9 % injection 10 mL, 10 mL, Intracatheter, PRN, Doreatha Massed, MD, 10 mL at 10/28/22 1446   sodium chloride flush (NS) 0.9 % injection 10 mL, 10 mL, Intracatheter, PRN, Doreatha Massed, MD, 10 mL at 11/19/22 1547   Allergies: Allergies  Allergen Reactions   Fentanyl Other (See Comments)    CAUSED HALLUCINATIONS/05-03-13 pt reports it was oral fentanyl that caused hallucinations    Gabapentin Other (See Comments)    Bones throbbed, headaches, weakness   Nabumetone     Unknown reaction    Amoxicillin Rash   Aspirin Nausea Only   Ciprofloxacin Hcl Rash    REVIEW OF SYSTEMS:   Review of Systems  Constitutional:  Negative for chills, fatigue and fever.  HENT:   Negative for lump/mass, mouth sores, nosebleeds, sore throat and trouble swallowing.   Eyes:  Negative for eye problems.  Respiratory:  Positive for cough and shortness of breath.   Cardiovascular:  Negative for chest pain, leg swelling and palpitations.  Gastrointestinal:  Negative for abdominal pain, constipation, diarrhea, nausea and vomiting.  Genitourinary:  Negative for bladder incontinence, difficulty urinating, dysuria, frequency, hematuria and nocturia.   Musculoskeletal:  Negative for arthralgias, back pain, flank pain, myalgias and neck pain.  Skin:  Negative for itching and rash.  Neurological:  Positive for dizziness. Negative for headaches and numbness.  Hematological:  Does not bruise/bleed easily.  Psychiatric/Behavioral:  Positive for sleep disturbance. Negative for depression and suicidal ideas. The patient is not nervous/anxious.   All other systems reviewed and are negative.    VITALS:   Weight 144 lb 6.4 oz (65.5 kg).  Wt  Readings from Last 3 Encounters:  11/18/23 144 lb 6.4 oz (65.5 kg)  10/19/23 136 lb 11 oz (62 kg)  09/21/23 138 lb (62.6 kg)    Body mass index is 23.31 kg/m.  Performance status (ECOG): 1 - Symptomatic but completely ambulatory  PHYSICAL EXAM:   Physical Exam Vitals and nursing note reviewed. Exam conducted with a chaperone present.  Constitutional:      Appearance: Normal appearance.  Cardiovascular:     Rate and Rhythm: Normal rate and regular rhythm.     Pulses: Normal pulses.     Heart sounds: Normal heart sounds.  Pulmonary:     Effort: Pulmonary effort is normal.     Breath sounds: Normal breath sounds.  Abdominal:     Palpations: Abdomen is soft. There is no hepatomegaly, splenomegaly or mass.     Tenderness: There  is no abdominal tenderness.  Musculoskeletal:     Right lower leg: No edema.     Left lower leg: No edema.  Lymphadenopathy:     Cervical: No cervical adenopathy.     Right cervical: No superficial, deep or posterior cervical adenopathy.    Left cervical: No superficial, deep or posterior cervical adenopathy.     Upper Body:     Right upper body: No supraclavicular or axillary adenopathy.     Left upper body: No supraclavicular or axillary adenopathy.  Neurological:     General: No focal deficit present.     Mental Status: He is alert and oriented to person, place, and time.  Psychiatric:        Mood and Affect: Mood normal.        Behavior: Behavior normal.     LABS:   CBC     Component Value Date/Time   WBC 12.3 (H) 11/18/2023 0814   RBC 3.67 (L) 11/18/2023 0814   HGB 12.4 (L) 11/18/2023 0814   HGB 13.5 05/03/2023 1601   HCT 37.4 (L) 11/18/2023 0814   HCT 39.8 05/03/2023 1601   PLT 247 11/18/2023 0814   PLT 266 05/03/2023 1601   MCV 101.9 (H) 11/18/2023 0814   MCV 98 (H) 05/03/2023 1601   MCH 33.8 11/18/2023 0814   MCHC 33.2 11/18/2023 0814   RDW 14.5 11/18/2023 0814   RDW 11.6 05/03/2023 1601   LYMPHSABS 0.7 11/18/2023 0814    LYMPHSABS 0.5 (L) 05/03/2023 1601   MONOABS 0.7 11/18/2023 0814   EOSABS 0.1 11/18/2023 0814   EOSABS 0.1 05/03/2023 1601   BASOSABS 0.1 11/18/2023 0814   BASOSABS 0.1 05/03/2023 1601    CMP      Component Value Date/Time   NA 138 11/18/2023 0814   NA 136 05/03/2023 1601   K 3.7 11/18/2023 0814   CL 103 11/18/2023 0814   CO2 23 11/18/2023 0814   GLUCOSE 119 (H) 11/18/2023 0814   BUN 21 11/18/2023 0814   BUN 18 05/03/2023 1601   BUN 23.9 07/05/2015 1239   CREATININE 0.96 11/18/2023 0814   CREATININE 0.9 07/05/2015 1239   CALCIUM 9.2 11/18/2023 0814   PROT 6.6 11/18/2023 0814   PROT 6.5 05/03/2023 1601   ALBUMIN 3.9 11/18/2023 0814   ALBUMIN 4.3 05/03/2023 1601   AST 24 11/18/2023 0814   ALT 24 11/18/2023 0814   ALKPHOS 48 11/18/2023 0814   BILITOT 0.6 11/18/2023 0814   BILITOT 0.2 05/03/2023 1601   GFRNONAA >60 11/18/2023 0814   GFRAA >60 05/29/2020 1407     No results found for: "CEA1", "CEA" / No results found for: "CEA1", "CEA" No results found for: "PSA1" No results found for: "NWG956" No results found for: "CAN125"  No results found for: "TOTALPROTELP", "ALBUMINELP", "A1GS", "A2GS", "BETS", "BETA2SER", "GAMS", "MSPIKE", "SPEI" Lab Results  Component Value Date   TIBC 310 09/30/2022   TIBC 323 04/14/2022   TIBC 373 10/01/2021   FERRITIN 108 09/30/2022   FERRITIN 65 04/14/2022   FERRITIN 49 10/01/2021   IRONPCTSAT 13 (L) 09/30/2022   IRONPCTSAT 22 04/14/2022   IRONPCTSAT 27 10/01/2021   Lab Results  Component Value Date   LDH 120 06/28/2021   LDH 130 12/20/2020   LDH 97 (L) 07/12/2018     STUDIES:   No results found.

## 2023-11-18 ENCOUNTER — Inpatient Hospital Stay (HOSPITAL_BASED_OUTPATIENT_CLINIC_OR_DEPARTMENT_OTHER): Admitting: Hematology

## 2023-11-18 ENCOUNTER — Inpatient Hospital Stay: Attending: Physician Assistant

## 2023-11-18 ENCOUNTER — Inpatient Hospital Stay

## 2023-11-18 VITALS — Wt 144.4 lb

## 2023-11-18 DIAGNOSIS — E876 Hypokalemia: Secondary | ICD-10-CM | POA: Insufficient documentation

## 2023-11-18 DIAGNOSIS — Z79899 Other long term (current) drug therapy: Secondary | ICD-10-CM | POA: Insufficient documentation

## 2023-11-18 DIAGNOSIS — C3412 Malignant neoplasm of upper lobe, left bronchus or lung: Secondary | ICD-10-CM

## 2023-11-18 DIAGNOSIS — R21 Rash and other nonspecific skin eruption: Secondary | ICD-10-CM | POA: Diagnosis not present

## 2023-11-18 DIAGNOSIS — E538 Deficiency of other specified B group vitamins: Secondary | ICD-10-CM | POA: Diagnosis not present

## 2023-11-18 DIAGNOSIS — Z95828 Presence of other vascular implants and grafts: Secondary | ICD-10-CM

## 2023-11-18 DIAGNOSIS — C7951 Secondary malignant neoplasm of bone: Secondary | ICD-10-CM | POA: Diagnosis not present

## 2023-11-18 DIAGNOSIS — Z87891 Personal history of nicotine dependence: Secondary | ICD-10-CM | POA: Diagnosis not present

## 2023-11-18 DIAGNOSIS — Z7952 Long term (current) use of systemic steroids: Secondary | ICD-10-CM | POA: Insufficient documentation

## 2023-11-18 LAB — COMPREHENSIVE METABOLIC PANEL
ALT: 24 U/L (ref 0–44)
AST: 24 U/L (ref 15–41)
Albumin: 3.9 g/dL (ref 3.5–5.0)
Alkaline Phosphatase: 48 U/L (ref 38–126)
Anion gap: 12 (ref 5–15)
BUN: 21 mg/dL (ref 8–23)
CO2: 23 mmol/L (ref 22–32)
Calcium: 9.2 mg/dL (ref 8.9–10.3)
Chloride: 103 mmol/L (ref 98–111)
Creatinine, Ser: 0.96 mg/dL (ref 0.61–1.24)
GFR, Estimated: >60 mL/min (ref >60)
Glucose, Bld: 119 mg/dL — ABNORMAL HIGH (ref 70–99)
Potassium: 3.7 mmol/L (ref 3.5–5.1)
Sodium: 138 mmol/L (ref 135–145)
Total Bilirubin: 0.6 mg/dL (ref 0.0–1.2)
Total Protein: 6.6 g/dL (ref 6.5–8.1)

## 2023-11-18 LAB — CBC WITH DIFFERENTIAL/PLATELET
Abs Immature Granulocytes: 0.27 10*3/uL — ABNORMAL HIGH (ref 0.00–0.07)
Basophils Absolute: 0.1 10*3/uL (ref 0.0–0.1)
Basophils Relative: 1 %
Eosinophils Absolute: 0.1 10*3/uL (ref 0.0–0.5)
Eosinophils Relative: 1 %
HCT: 37.4 % — ABNORMAL LOW (ref 39.0–52.0)
Hemoglobin: 12.4 g/dL — ABNORMAL LOW (ref 13.0–17.0)
Immature Granulocytes: 2 %
Lymphocytes Relative: 6 %
Lymphs Abs: 0.7 10*3/uL (ref 0.7–4.0)
MCH: 33.8 pg (ref 26.0–34.0)
MCHC: 33.2 g/dL (ref 30.0–36.0)
MCV: 101.9 fL — ABNORMAL HIGH (ref 80.0–100.0)
Monocytes Absolute: 0.7 10*3/uL (ref 0.1–1.0)
Monocytes Relative: 5 %
Neutro Abs: 10.5 10*3/uL — ABNORMAL HIGH (ref 1.7–7.7)
Neutrophils Relative %: 85 %
Platelets: 247 10*3/uL (ref 150–400)
RBC: 3.67 MIL/uL — ABNORMAL LOW (ref 4.22–5.81)
RDW: 14.5 % (ref 11.5–15.5)
WBC: 12.3 10*3/uL — ABNORMAL HIGH (ref 4.0–10.5)
nRBC: 0 % (ref 0.0–0.2)

## 2023-11-18 LAB — TSH: TSH: 1.09 u[IU]/mL (ref 0.350–4.500)

## 2023-11-18 LAB — MAGNESIUM: Magnesium: 1.9 mg/dL (ref 1.7–2.4)

## 2023-11-18 MED ORDER — SODIUM CHLORIDE 0.9% FLUSH
10.0000 mL | Freq: Once | INTRAVENOUS | Status: AC
  Administered 2023-11-18: 10 mL via INTRAVENOUS

## 2023-11-18 MED ORDER — HEPARIN SOD (PORK) LOCK FLUSH 100 UNIT/ML IV SOLN
500.0000 [IU] | Freq: Once | INTRAVENOUS | Status: AC
  Administered 2023-11-18: 500 [IU] via INTRAVENOUS

## 2023-11-18 NOTE — Progress Notes (Signed)
 No treatment today per oncologist.  Patient taking antibiotics for pneumonia.

## 2023-11-18 NOTE — Patient Instructions (Signed)
 [facility] at [facility] Discharge Instructions   You were seen and examined today by Dr. Ellin Saba.  He reviewed the results of your lab work which are normal/stable.   We will hold your treatment today. We will see you back in 6 weeks. We will repeat a CT scan prior to this visit.   Return as scheduled.    Thank you for choosing [facility] at [facility] to provide your oncology and hematology care.  To afford each patient quality time with our provider, please arrive at least 15 minutes before your scheduled appointment time.   If you have a lab appointment with the Cancer Center please come in thru the Main Entrance and check in at the main information desk.  You need to re-schedule your appointment should you arrive 10 or more minutes late.  We strive to give you quality time with our providers, and arriving late affects you and other patients whose appointments are after yours.  Also, if you no show three or more times for appointments you may be dismissed from the clinic at the providers discretion.     Again, thank you for choosing [facility].  Our hope is that these requests will decrease the amount of time that you wait before being seen by our physicians.       _____________________________________________________________  Should you have questions after your visit to [facility], please contact our office at [phone] and follow the prompts.  Our office hours are 8:00 a.m. and 4:30 p.m. Monday - Friday.  Please note that voicemails left after 4:00 p.m. may not be returned until the following business day.  We are closed weekends and major holidays.  You do have access to a nurse 24-7, just call the main number to the clinic [phone] and do not press any options, hold on the line and a nurse will answer the phone.    For prescription refill requests, have your pharmacy contact our office and allow 72  hours.    Due to Covid, you will need to wear a mask upon entering the hospital. If you do not have a mask, a mask will be given to you at the Main Entrance upon arrival. For doctor visits, patients may have 1 support person age 6 or older with them. For treatment visits, patients can not have anyone with them due to social distancing guidelines and our immunocompromised population.

## 2023-11-19 ENCOUNTER — Other Ambulatory Visit: Payer: Self-pay

## 2023-11-23 ENCOUNTER — Other Ambulatory Visit: Payer: Medicare Other

## 2023-11-23 ENCOUNTER — Ambulatory Visit: Payer: Medicare Other

## 2023-11-23 ENCOUNTER — Ambulatory Visit: Payer: Medicare Other | Admitting: Hematology

## 2023-11-28 ENCOUNTER — Other Ambulatory Visit: Payer: Self-pay

## 2023-11-30 ENCOUNTER — Other Ambulatory Visit: Payer: Medicare Other

## 2023-11-30 ENCOUNTER — Ambulatory Visit: Payer: Medicare Other | Admitting: Hematology

## 2023-11-30 ENCOUNTER — Ambulatory Visit: Payer: Medicare Other

## 2023-12-07 DIAGNOSIS — M5134 Other intervertebral disc degeneration, thoracic region: Secondary | ICD-10-CM | POA: Diagnosis not present

## 2023-12-07 DIAGNOSIS — M546 Pain in thoracic spine: Secondary | ICD-10-CM | POA: Diagnosis not present

## 2023-12-07 DIAGNOSIS — I10 Essential (primary) hypertension: Secondary | ICD-10-CM | POA: Diagnosis not present

## 2023-12-07 DIAGNOSIS — M48061 Spinal stenosis, lumbar region without neurogenic claudication: Secondary | ICD-10-CM | POA: Diagnosis not present

## 2023-12-07 DIAGNOSIS — J449 Chronic obstructive pulmonary disease, unspecified: Secondary | ICD-10-CM | POA: Diagnosis not present

## 2023-12-07 DIAGNOSIS — G8929 Other chronic pain: Secondary | ICD-10-CM | POA: Diagnosis not present

## 2023-12-07 DIAGNOSIS — C3432 Malignant neoplasm of lower lobe, left bronchus or lung: Secondary | ICD-10-CM | POA: Diagnosis not present

## 2023-12-21 ENCOUNTER — Ambulatory Visit (HOSPITAL_COMMUNITY)
Admission: RE | Admit: 2023-12-21 | Discharge: 2023-12-21 | Disposition: A | Discharge status: Home / Self Care | Source: Ambulatory Visit | Attending: Hematology | Admitting: Hematology

## 2023-12-21 DIAGNOSIS — C3412 Malignant neoplasm of upper lobe, left bronchus or lung: Secondary | ICD-10-CM | POA: Diagnosis not present

## 2023-12-21 DIAGNOSIS — C7951 Secondary malignant neoplasm of bone: Secondary | ICD-10-CM | POA: Diagnosis not present

## 2023-12-21 DIAGNOSIS — K449 Diaphragmatic hernia without obstruction or gangrene: Secondary | ICD-10-CM | POA: Diagnosis not present

## 2023-12-21 MED ORDER — IOHEXOL 300 MG/ML  SOLN
80.0000 mL | Freq: Once | INTRAMUSCULAR | Status: AC | PRN
  Administered 2023-12-21: 80 mL via INTRAVENOUS

## 2023-12-22 ENCOUNTER — Other Ambulatory Visit: Payer: Self-pay | Admitting: Hematology

## 2023-12-29 ENCOUNTER — Inpatient Hospital Stay (HOSPITAL_BASED_OUTPATIENT_CLINIC_OR_DEPARTMENT_OTHER): Admitting: Hematology

## 2023-12-29 ENCOUNTER — Inpatient Hospital Stay: Attending: Physician Assistant

## 2023-12-29 ENCOUNTER — Inpatient Hospital Stay

## 2023-12-29 VITALS — Wt 137.3 lb

## 2023-12-29 DIAGNOSIS — D649 Anemia, unspecified: Secondary | ICD-10-CM | POA: Diagnosis not present

## 2023-12-29 DIAGNOSIS — Z95828 Presence of other vascular implants and grafts: Secondary | ICD-10-CM

## 2023-12-29 DIAGNOSIS — C3412 Malignant neoplasm of upper lobe, left bronchus or lung: Secondary | ICD-10-CM | POA: Diagnosis not present

## 2023-12-29 DIAGNOSIS — E538 Deficiency of other specified B group vitamins: Secondary | ICD-10-CM | POA: Insufficient documentation

## 2023-12-29 DIAGNOSIS — C7951 Secondary malignant neoplasm of bone: Secondary | ICD-10-CM | POA: Insufficient documentation

## 2023-12-29 DIAGNOSIS — Z87891 Personal history of nicotine dependence: Secondary | ICD-10-CM | POA: Insufficient documentation

## 2023-12-29 DIAGNOSIS — E611 Iron deficiency: Secondary | ICD-10-CM | POA: Insufficient documentation

## 2023-12-29 DIAGNOSIS — E876 Hypokalemia: Secondary | ICD-10-CM | POA: Diagnosis not present

## 2023-12-29 DIAGNOSIS — R21 Rash and other nonspecific skin eruption: Secondary | ICD-10-CM | POA: Diagnosis not present

## 2023-12-29 LAB — CBC WITH DIFFERENTIAL/PLATELET
Abs Immature Granulocytes: 0.07 10*3/uL (ref 0.00–0.07)
Basophils Absolute: 0.1 10*3/uL (ref 0.0–0.1)
Basophils Relative: 1 %
Eosinophils Absolute: 0.2 10*3/uL (ref 0.0–0.5)
Eosinophils Relative: 2 %
HCT: 43.6 % (ref 39.0–52.0)
Hemoglobin: 14.5 g/dL (ref 13.0–17.0)
Immature Granulocytes: 1 %
Lymphocytes Relative: 6 %
Lymphs Abs: 0.6 10*3/uL — ABNORMAL LOW (ref 0.7–4.0)
MCH: 33.2 pg (ref 26.0–34.0)
MCHC: 33.3 g/dL (ref 30.0–36.0)
MCV: 99.8 fL (ref 80.0–100.0)
Monocytes Absolute: 0.7 10*3/uL (ref 0.1–1.0)
Monocytes Relative: 7 %
Neutro Abs: 8.3 10*3/uL — ABNORMAL HIGH (ref 1.7–7.7)
Neutrophils Relative %: 83 %
Platelets: 219 10*3/uL (ref 150–400)
RBC: 4.37 MIL/uL (ref 4.22–5.81)
RDW: 13.3 % (ref 11.5–15.5)
WBC: 9.9 10*3/uL (ref 4.0–10.5)
nRBC: 0 % (ref 0.0–0.2)

## 2023-12-29 LAB — COMPREHENSIVE METABOLIC PANEL WITH GFR
ALT: 22 U/L (ref 0–44)
AST: 24 U/L (ref 15–41)
Albumin: 3.8 g/dL (ref 3.5–5.0)
Alkaline Phosphatase: 61 U/L (ref 38–126)
Anion gap: 12 (ref 5–15)
BUN: 16 mg/dL (ref 8–23)
CO2: 22 mmol/L (ref 22–32)
Calcium: 9.2 mg/dL (ref 8.9–10.3)
Chloride: 98 mmol/L (ref 98–111)
Creatinine, Ser: 0.92 mg/dL (ref 0.61–1.24)
GFR, Estimated: >60 mL/min (ref >60)
Glucose, Bld: 121 mg/dL — ABNORMAL HIGH (ref 70–99)
Potassium: 3.5 mmol/L (ref 3.5–5.1)
Sodium: 132 mmol/L — ABNORMAL LOW (ref 135–145)
Total Bilirubin: 1 mg/dL (ref 0.0–1.2)
Total Protein: 7 g/dL (ref 6.5–8.1)

## 2023-12-29 LAB — TSH: TSH: 1.94 u[IU]/mL (ref 0.350–4.500)

## 2023-12-29 LAB — MAGNESIUM: Magnesium: 1.9 mg/dL (ref 1.7–2.4)

## 2023-12-29 MED ORDER — SODIUM CHLORIDE 0.9% FLUSH
10.0000 mL | Freq: Once | INTRAVENOUS | Status: AC
  Administered 2023-12-29: 10 mL via INTRAVENOUS

## 2023-12-29 MED ORDER — HEPARIN SOD (PORK) LOCK FLUSH 100 UNIT/ML IV SOLN
500.0000 [IU] | Freq: Once | INTRAVENOUS | Status: AC
  Administered 2023-12-29: 500 [IU] via INTRAVENOUS

## 2023-12-29 NOTE — Patient Instructions (Addendum)
 [facility] at [facility] Discharge Instructions   You were seen and examined today by Dr. Cheree Cords.  He reviewed the results of your lab work which are normal/stable.   He reviewed the results of your CT scan which does not show any evidence of cancer.   We will see you back in 3 months. We will repeat lab work and a CT scan prior to this visit.   Return as scheduled.    Thank you for choosing [facility] at [facility] to provide your oncology and hematology care.  To afford each patient quality time with our provider, please arrive at least 15 minutes before your scheduled appointment time.   If you have a lab appointment with the Cancer Center please come in thru the Main Entrance and check in at the main information desk.  You need to re-schedule your appointment should you arrive 10 or more minutes late.  We strive to give you quality time with our providers, and arriving late affects you and other patients whose appointments are after yours.  Also, if you no show three or more times for appointments you may be dismissed from the clinic at the providers discretion.     Again, thank you for choosing [facility].  Our hope is that these requests will decrease the amount of time that you wait before being seen by our physicians.       _____________________________________________________________  Should you have questions after your visit to [facility], please contact our office at [phone] and follow the prompts.  Our office hours are 8:00 a.m. and 4:30 p.m. Monday - Friday.  Please note that voicemails left after 4:00 p.m. may not be returned until the following business day.  We are closed weekends and major holidays.  You do have access to a nurse 24-7, just call the main number to the clinic [phone] and do not press any options, hold on the line and a nurse will answer the phone.    For prescription  refill requests, have your pharmacy contact our office and allow 72 hours.    Due to Covid, you will need to wear a mask upon entering the hospital. If you do not have a mask, a mask will be given to you at the Main Entrance upon arrival. For doctor visits, patients may have 1 support person age 65 or older with them. For treatment visits, patients can not have anyone with them due to social distancing guidelines and our immunocompromised population.

## 2023-12-29 NOTE — Progress Notes (Signed)
 Treatment held  per A.Alva Jewels  RN / Dr. Cheree Cords. Vital signs stable. No complaints at this time. Discharged from clinic ambulatory in stable condition. Alert and oriented x 3. F/U with [facility] as scheduled.

## 2023-12-29 NOTE — Addendum Note (Signed)
 Addended by: Artie Laster on: 12/29/2023 02:55 PM   Modules accepted: Orders

## 2023-12-29 NOTE — Progress Notes (Signed)
 [facility] [address] [place] [address]    Clinic Day:  12/29/2023  Referring physician: Kathyleen Parkins, MD  Patient Care Team: Kathyleen Parkins, MD as PCP - General (Internal Medicine) Arleen Lacer, MD as PCP - Cardiology (Cardiology) Ruby Corporal, MD (Inactive) as Consulting Physician (Gastroenterology) Colie Dawes, MD as Attending Physician (Radiation Oncology)   ASSESSMENT & PLAN:   Assessment: 1.  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 - Status post right upper lobectomy in February 2012 for right lung cancer. - LDCT chest (04/14/2022): Enlarging spiculated nodular soft tissue thickening in the posterior left upper lobe measuring 11.4 mm. - PET scan (04/24/2022): Left upper lobe lung nodule 14 mm with SUV 3.62.  Hypermetabolic focus in the inferior aspect of the right scapula without discrete CT correlate with SUV 5.71.  No hypermetabolic adenopathy. - LUL lesion FNA (06/03/2022): Adenocarcinoma - Right scapula biopsy (07/03/2022) adenocarcinoma consistent with lung primary - NGS: PD-L1 22 C3 TPS 100%, T p53 pathogenic variant, MSI-stable, TMB-low, no other targetable mutations - Cycle 1 of pembrolizumab  started on 07/21/2022 - XRT to the right scapular lesion and left upper lobe lung lesion 5 fractions completed on 08/18/2022.   2.  Cystic lesion of left kidney - CTA PE on 04/14/2022 showed complex cystic lesion of the left kidney.  PET scan also showed exophytic 18 mm left renal lesion suspicious for malignancy. - MRI abdomen on 05/16/2022 showed benign appearing Bosniak category 1 and 2 left renal cyst with no evidence of neoplasm.  No further work-up needed.    Plan: 1.  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  Held due to pneumonia/?  Pneumonitis on CT from 10/06/2023. - He reports that his dyspnea on  exertion is stable.  Reviewed labs from today: Normal LFTs and creatinine.  Sodium is mildly low at 132.  CBC was normal.  TSH is 1.9. - CT chest from 12/21/2023: Postsurgical and radiation changes without evidence of recurrence.  Inferior right scapular sclerosis consistent with treated metastasis. - He does not have any disease recurrence.  We would continue to hold Keytruda  at this time.  Return to clinic in 3 months with repeat CT chest.  If there is any progression, we will plan on resuming Keytruda .   2.  Left posterior chest wall rash: - He is continuing prednisone  5 mg daily.  Also using betamethasone  cream twice daily.   3.  B12/iron deficiency: - He stopped taking B12 and try taking B complex and had rash.  However he is continuing iron tablet daily.   4.  Hypokalemia: - Continue potassium 10 mill equivalents twice daily.  Potassium is 3.5.    Orders Placed This Encounter  Procedures   CT CHEST W CONTRAST    Standing Status:   Future    Expected Date:   03/29/2024    Expiration Date:   12/28/2024    If indicated for the ordered procedure, I authorize the administration of contrast media per Radiology protocol:   Yes    Does the patient have a contrast media/X-ray dye allergy?:   No    Preferred imaging location?:   [facility]    Release to patient:   Immediate [1]    Release to patient:   Immediate      I,Helena R Teague,acting as a scribe for  Paulett Boros, MD.,have documented all relevant documentation on the behalf of Paulett Boros, MD,as directed by  Paulett Boros, MD while in the presence of Paulett Boros, MD.  I, Paulett Boros MD, have reviewed the above documentation for accuracy and completeness, and I agree with the above.    Paulett Boros, MD   4/22/20252:25 PM  CHIEF COMPLAINT:   Diagnosis: bilateral lung cancers, normocytic anemia, and B12 deficiency    Cancer Staging  Adenocarcinoma of lung (HCC) Staging  form: Lung, AJCC 7th Edition - Clinical: Stage IA (T1a, N0, M0) - Signed by Doretta Gant, PA on 05/06/2011  Adenocarcinoma of upper lobe of left lung [facility]) Staging form: Lung, AJCC 8th Edition - Clinical stage from 07/16/2022: Stage IVA (cT1b, cN0, pM1b) - Unsigned    Prior Therapy: 1. Right upper lobectomy (February 2012) for right lung cancer 2. SBRT (November 2015) for stage I left adenocarcinoma  Current Therapy:  Pembrolizumab     HISTORY OF PRESENT ILLNESS:   Oncology History  Adenocarcinoma of lung (HCC)  09/30/2010 Cancer Staging   CT of chest showed spiculated lesion   10/15/2010 Surgery   Right upper lobectomy by Dr. Percy Bracken   10/16/2010 Remission     10/24/2013 Imaging   CT Chest-  Enlarging subpleural nodule in the left lower lobe, now 9 mm, with distortion of the overlying pleura. Finding is concerning for a small bronchogenic carcinoma   01/23/2014 Imaging   CT Chest- Left lower lobe nodule has enlarged from 11/01/2012 and is worrisome for primary bronchogenic carcinoma.    07/04/2014 - 07/10/2014 Radiation Therapy   SBRT by Dr. Lurena Sally in 3 fractions.   07/05/2015 Imaging   perifissural nodule in LLL appears slightly smaller with parenchymal opacity attrib to XRT, likely inflamm pathcy airspace dz more inferiorly in LLL, airspace dz in RLL resolved. no metastatic disease   Primary cancer of left lower lobe of lung (HCC)  06/18/2014 Initial Diagnosis   Left lower lobe nodule suspicious for primary lung carcinoma   07/04/2014 - 07/10/2014 Radiation Therapy   SBRT   10/12/2014 Imaging   No acute findings within the chest. Decrease in size of left lower lobe perifissural nodule compatible with response to therapy. No new or progressive disease identified within the chest.   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)  07/16/2022 Initial Diagnosis   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)   07/21/2022 -  Chemotherapy   Patient is on Treatment Plan : LUNG  NSCLC Pembrolizumab  (200) q21d        INTERVAL HISTORY:   Maurice Orozco is a 78 y.o. male presenting to clinic today for follow up of bilateral lung cancers, normocytic anemia, and B12 deficiency. He was last seen by me on 11/18/23.  Since her last visit, she underwent CT chest on 12/21/23 that found: Multifocal surgical and radiation changes, without recurrent or metastatic disease. Resolution of multifocal pneumonia. Similar inferior right scapular sclerosis, consistent with a site of treated metastasis. Aortic atherosclerosis, coronary artery atherosclerosis and emphysema. Moderate hiatal hernia  Today, he states that he is doing well overall. His appetite level is at 100%. His energy level is at 75%. He is accompanied by his wife. He reports a mild cough as well as congestion.   He is taking Prednisone  as prescribed. Deran reports non-erythematous macules on the right abdomen. He has been using steroid cream as needed for the rash. He is no longer taking vitamin B-12 supplements, as it caused an itchy rash  to reappear. Ramere was recommended a vitamin complex by his PCP, but he did not take it as he thought the complex may also cause a rash. He is taking iron and potassium supplements.   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 dense RCA calcification --Non-ischemic Myoview    History of kidney stones    in the 70's   History of radiation therapy 07/04/14, 07/06/14, 07/10/14   LLL lung  nodule/54 Gy/3 fx- SBRT   Hypercholesterolemia 05/06/2011   Pneumonia due to Streptococcus    HX. POST OPERATIVELY   Port-A-Cath in place 07/21/2022   Pre-diabetes    Stress fx pelvis    DUE TO AA IN 1970    Surgical History: Past Surgical History:  Procedure Laterality Date   BACK SURGERY  07/04/2017   fusion above previous surgery   BACK SURGERY  2011   metal plate Z-6,X0   BRONCHIAL BIOPSY  06/03/2022   Procedure: BRONCHIAL BIOPSIES;  Surgeon: Prudy Brownie, DO;  Location: [facility];  Service: Pulmonary;;   BRONCHIAL BRUSHINGS  06/03/2022   Procedure: BRONCHIAL BRUSHINGS;  Surgeon: Prudy Brownie, DO;  Location: [facility];  Service: Pulmonary;;   BRONCHIAL NEEDLE ASPIRATION BIOPSY  06/03/2022   Procedure: BRONCHIAL NEEDLE ASPIRATION BIOPSIES;  Surgeon: Prudy Brownie, DO;  Location: [facility];  Service: Pulmonary;;   Coroanry Calcium  Score  03/2017   1043. Noted especially in RCA. Dense calcification, recommend Myoview  over Cor CTA.   FIDUCIAL MARKER PLACEMENT  06/03/2022   Procedure: FIDUCIAL MARKER PLACEMENT;  Surgeon: Prudy Brownie, DO;  Location: [facility];  Service: Pulmonary;;   IR IMAGING GUIDED PORT INSERTION  08/04/2022   LIPOMA EXCISION N/A 10/14/2017   Procedure: EXCISION LIPOMA ON BACK;  Surgeon: Awilda Bogus, MD;  Location: [facility];  Service: General;  Laterality: N/A;   LIPOMA EXCISION Left 11/19/2018   Procedure: MINOR EXCISION OF SEBACEOUS CYST NECK;  Surgeon: Awilda Bogus, MD;  Location: [facility];  Service: General;  Laterality: Left;  pt knows to arrive at 7:00   LUNG LOBECTOMY  2012   RT UPPER LOBE   NM MYOVIEW  LTD  04/2017   EF 60%. Hypertensive response to exercise. (6:21 min, 7.7 METs) No EKG changes. LOW RISK    Social History: Social History   Socioeconomic History   Marital status: Married    Spouse name: Not on file   Number of children: 3   Years of education: Not on file   Highest education level: Not on file   Occupational History   Not on file  Tobacco Use   Smoking status: Former    Current packs/day: 0.00    Average packs/day: 1.5 packs/day for 59.0 years (88.5 ttl pk-yrs)    Types: Cigarettes    Start date: 09/09/1950    Quit date: 09/08/2010    Years since quitting: 13.3  Smokeless tobacco: Current    Types: Chew   Tobacco comments:    still chews tobacco, he tells me he hasn't in the past 2 days.   Vaping Use   Vaping status: Never Used  Substance and Sexual Activity   Alcohol use: No   Drug use: No   Sexual activity: Not Currently  Other Topics Concern   Not on file  Social History Narrative   Not on file   Social Drivers of Health   Financial Resource Strain: Not on file  Food Insecurity: Not on file  Transportation Needs: No Transportation Needs (07/13/2018)   PRAPARE - Administrator, Civil Service (Medical): No    Lack of Transportation (Non-Medical): No  Physical Activity: Not on file  Stress: Not on file  Social Connections: Not on file  Intimate Partner Violence: Not At Risk (07/13/2018)   Humiliation, Afraid, Rape, and Kick questionnaire    Fear of Current or Ex-Partner: No    Emotionally Abused: No    Physically Abused: No    Sexually Abused: No    Family History: Family History  Problem Relation Age of Onset   Hypertension Mother    Kidney disease Father    Heart attack Brother    Cancer Maternal Uncle        prostate cancer   Cancer Maternal Uncle        prostate cancer   Cancer Maternal Uncle        prostate cancer   Cancer Maternal Uncle        prostate cancer    Current Medications:  Current Outpatient Medications:    albuterol  (PROVENTIL  HFA;VENTOLIN  HFA) 108 (90 Base) MCG/ACT inhaler, Inhale 2 puffs into the lungs every 4 (four) hours as needed for wheezing or shortness of breath., Disp: 2 Inhaler, Rfl: 3   betamethasone  dipropionate 0.05 % cream, APPLY TOPICALLY TWICE A DAY, Disp: 30 g, Rfl: 1   cetirizine (ZYRTEC) 10 MG  tablet, Take 10 mg by mouth daily., Disp: , Rfl:    cholecalciferol  (VITAMIN D3) 25 MCG (1000 UNIT) tablet, Take 1,000 Units by mouth daily., Disp: , Rfl:    cimetidine (TAGAMET) 200 MG tablet, Take 400-600 mg by mouth daily as needed (for heartburn)., Disp: , Rfl:    clobetasol ointment (TEMOVATE) 0.05 %, Apply 1 Application topically daily as needed (itching)., Disp: , Rfl:    ferrous sulfate 325 (65 FE) MG tablet, Take 325 mg by mouth daily with breakfast., Disp: , Rfl:    fluticasone  (FLONASE ) 50 MCG/ACT nasal spray, Place 2 sprays into both nostrils daily as needed for rhinitis., Disp: , Rfl:    Glycopyrrolate-Formoterol (BEVESPI AEROSPHERE) 9-4.8 MCG/ACT AERO, Inhale 2 puffs into the lungs 2 (two) times daily., Disp: , Rfl:    guaiFENesin  (MUCINEX ) 600 MG 12 hr tablet, Take 600 mg by mouth daily., Disp: , Rfl:    hydrochlorothiazide (HYDRODIURIL) 25 MG tablet, Take 25 mg by mouth daily., Disp: , Rfl:    HYDROcodone -acetaminophen  (NORCO) 10-325 MG tablet, Take 1-2 tablets by mouth every 4 (four) hours., Disp: , Rfl:    hydrOXYzine  (VISTARIL ) 25 MG capsule, Take 25-50 mg by mouth every 6 (six) hours as needed for itching., Disp: , Rfl:    KLOR-CON  M10 10 MEQ tablet, Take 20 mEq by mouth daily., Disp: , Rfl:    lidocaine -prilocaine  (EMLA ) cream, Apply a small amount to port a cath site and cover with plastic wrap 1 hour prior to infusion appointments, Disp: 30  g, Rfl: 3   naproxen sodium (ALEVE) 220 MG tablet, Take 220 mg by mouth daily as needed (pain)., Disp: , Rfl:    Omega-3 Fatty Acids (FISH OIL) 1000 MG CAPS, Take 1,000 mg by mouth daily., Disp: , Rfl:    ondansetron  (ZOFRAN ) 4 MG tablet, Take 1 tablet (4 mg total) by mouth every 8 (eight) hours as needed., Disp: 30 tablet, Rfl: 1   oxyCODONE  (ROXICODONE ) 5 MG immediate release tablet, Take 1 tablet (5 mg total) by mouth every 4 (four) hours as needed for severe pain (pain score 7-10) or breakthrough pain., Disp: 10 tablet, Rfl: 0    pantoprazole  (PROTONIX ) 40 MG tablet, Take 40 mg by mouth See admin instructions. Take 40 mg daily, may take a second 40 mg dose as needed for heartburn, Disp: , Rfl:    PEMBROLIZUMAB  IV, Inject into the vein every 21 ( twenty-one) days., Disp: , Rfl:    polyethylene glycol powder (GLYCOLAX/MIRALAX) 17 GM/SCOOP powder, Take 17 g by mouth daily as needed for mild constipation or moderate constipation., Disp: , Rfl:    predniSONE  (DELTASONE ) 5 MG tablet, TAKE 1 TABLET BY MOUTH EVERY DAY WITH BREAKFAST, Disp: 30 tablet, Rfl: 4   prochlorperazine  (COMPAZINE ) 10 MG tablet, Take 1 tablet (10 mg total) by mouth every 6 (six) hours as needed for nausea or vomiting., Disp: 30 tablet, Rfl: 1   rosuvastatin  (CRESTOR ) 40 MG tablet, Take 40 mg by mouth at bedtime., Disp: , Rfl:    tamsulosin  (FLOMAX ) 0.4 MG CAPS capsule, TAKE 1 CAPSULE BY MOUTH EVERY DAY, Disp: 90 capsule, Rfl: 1   triamcinolone  ointment (KENALOG ) 0.1 %, Apply topically 2 (two) times daily as needed., Disp: , Rfl:    zolpidem (AMBIEN) 5 MG tablet, Take 5 mg by mouth daily., Disp: , Rfl:  No current facility-administered medications for this visit.  Facility-Administered Medications Ordered in Other Visits:    sodium chloride  flush (NS) 0.9 % injection 10 mL, 10 mL, Intracatheter, PRN, Frankye Schwegel, MD, 10 mL at 10/07/22 1523   sodium chloride  flush (NS) 0.9 % injection 10 mL, 10 mL, Intracatheter, PRN, Jaice Digioia, MD, 10 mL at 10/28/22 1446   sodium chloride  flush (NS) 0.9 % injection 10 mL, 10 mL, Intracatheter, PRN, Danise Dehne, MD, 10 mL at 11/19/22 1547   Allergies: Allergies  Allergen Reactions   Fentanyl  Other (See Comments)    CAUSED HALLUCINATIONS/05-03-13 pt reports it was oral fentanyl  that caused hallucinations    Gabapentin Other (See Comments)    Bones throbbed, headaches, weakness   Nabumetone     Unknown reaction    Amoxicillin  Rash   Aspirin Nausea Only   Ciprofloxacin Hcl Rash    REVIEW OF  SYSTEMS:   Review of Systems  Constitutional:  Negative for chills, fatigue and fever.  HENT:   Negative for lump/mass, mouth sores, nosebleeds, sore throat and trouble swallowing.   Eyes:  Negative for eye problems.  Respiratory:  Positive for cough. Negative for shortness of breath.   Cardiovascular:  Negative for chest pain, leg swelling and palpitations.  Gastrointestinal:  Negative for abdominal pain, constipation, diarrhea, nausea and vomiting.  Genitourinary:  Negative for bladder incontinence, difficulty urinating, dysuria, frequency, hematuria and nocturia.   Musculoskeletal:  Positive for back pain (8/10 severity). Negative for arthralgias, flank pain, myalgias and neck pain.  Skin:  Negative for itching and rash.  Neurological:  Negative for dizziness, headaches and numbness.  Hematological:  Does not bruise/bleed easily.  Psychiatric/Behavioral:  Negative  for depression, sleep disturbance and suicidal ideas. The patient is not nervous/anxious.   All other systems reviewed and are negative.    VITALS:   Weight 137 lb 5.6 oz (62.3 kg).  Wt Readings from Last 3 Encounters:  12/29/23 137 lb 5.6 oz (62.3 kg)  11/18/23 144 lb 6.4 oz (65.5 kg)  10/19/23 136 lb 11 oz (62 kg)    Body mass index is 22.17 kg/m.  Performance status (ECOG): 1 - Symptomatic but completely ambulatory  PHYSICAL EXAM:   Physical Exam Vitals and nursing note reviewed. Exam conducted with a chaperone present.  Constitutional:      Appearance: Normal appearance.  Cardiovascular:     Rate and Rhythm: Normal rate and regular rhythm.     Pulses: Normal pulses.     Heart sounds: Normal heart sounds.  Pulmonary:     Effort: Pulmonary effort is normal.     Breath sounds: Normal breath sounds.  Abdominal:     Palpations: Abdomen is soft. There is no hepatomegaly, splenomegaly or mass.     Tenderness: There is no abdominal tenderness.  Musculoskeletal:     Right lower leg: No edema.     Left lower  leg: No edema.  Lymphadenopathy:     Cervical: No cervical adenopathy.     Right cervical: No superficial, deep or posterior cervical adenopathy.    Left cervical: No superficial, deep or posterior cervical adenopathy.     Upper Body:     Right upper body: No supraclavicular or axillary adenopathy.     Left upper body: No supraclavicular or axillary adenopathy.  Neurological:     General: No focal deficit present.     Mental Status: He is alert and oriented to person, place, and time.  Psychiatric:        Mood and Affect: Mood normal.        Behavior: Behavior normal.     LABS:   CBC     Component Value Date/Time   WBC 9.9 12/29/2023 1312   RBC 4.37 12/29/2023 1312   HGB 14.5 12/29/2023 1312   HGB 13.5 05/03/2023 1601   HCT 43.6 12/29/2023 1312   HCT 39.8 05/03/2023 1601   PLT 219 12/29/2023 1312   PLT 266 05/03/2023 1601   MCV 99.8 12/29/2023 1312   MCV 98 (H) 05/03/2023 1601   MCH 33.2 12/29/2023 1312   MCHC 33.3 12/29/2023 1312   RDW 13.3 12/29/2023 1312   RDW 11.6 05/03/2023 1601   LYMPHSABS 0.6 (L) 12/29/2023 1312   LYMPHSABS 0.5 (L) 05/03/2023 1601   MONOABS 0.7 12/29/2023 1312   EOSABS 0.2 12/29/2023 1312   EOSABS 0.1 05/03/2023 1601   BASOSABS 0.1 12/29/2023 1312   BASOSABS 0.1 05/03/2023 1601    CMP      Component Value Date/Time   NA 132 (L) 12/29/2023 1312   NA 136 05/03/2023 1601   K 3.5 12/29/2023 1312   CL 98 12/29/2023 1312   CO2 22 12/29/2023 1312   GLUCOSE 121 (H) 12/29/2023 1312   BUN 16 12/29/2023 1312   BUN 18 05/03/2023 1601   BUN 23.9 07/05/2015 1239   CREATININE 0.92 12/29/2023 1312   CREATININE 0.9 07/05/2015 1239   CALCIUM  9.2 12/29/2023 1312   PROT 7.0 12/29/2023 1312   PROT 6.5 05/03/2023 1601   ALBUMIN 3.8 12/29/2023 1312   ALBUMIN 4.3 05/03/2023 1601   AST 24 12/29/2023 1312   ALT 22 12/29/2023 1312   ALKPHOS 61 12/29/2023 1312  BILITOT 1.0 12/29/2023 1312   BILITOT 0.2 05/03/2023 1601   GFRNONAA >60 12/29/2023 1312    GFRAA >60 05/29/2020 1407     No results found for: "CEA1", "CEA" / No results found for: "CEA1", "CEA" No results found for: "PSA1" No results found for: "ZOX096" No results found for: "CAN125"  No results found for: "TOTALPROTELP", "ALBUMINELP", "A1GS", "A2GS", "BETS", "BETA2SER", "GAMS", "MSPIKE", "SPEI" Lab Results  Component Value Date   TIBC 310 09/30/2022   TIBC 323 04/14/2022   TIBC 373 10/01/2021   FERRITIN 108 09/30/2022   FERRITIN 65 04/14/2022   FERRITIN 49 10/01/2021   IRONPCTSAT 13 (L) 09/30/2022   IRONPCTSAT 22 04/14/2022   IRONPCTSAT 27 10/01/2021   Lab Results  Component Value Date   LDH 120 06/28/2021   LDH 130 12/20/2020   LDH 97 (L) 07/12/2018     STUDIES:   CT CHEST W CONTRAST Result Date: 12/28/2023 CLINICAL DATA:  Adenocarcinoma of left upper lobe. Evaluate treatment response. * Tracking Code: BO * EXAM: CT CHEST WITH CONTRAST TECHNIQUE: Multidetector CT imaging of the chest was performed during intravenous contrast administration. RADIATION DOSE REDUCTION: This exam was performed according to the departmental dose-optimization program which includes automated exposure control, adjustment of the mA and/or kV according to patient size and/or use of iterative reconstruction technique. CONTRAST:  80mL OMNIPAQUE  IOHEXOL  300 MG/ML  SOLN COMPARISON:  10/06/2023 FINDINGS: Cardiovascular: Right Port-A-Cath tip mid right atrium. Advanced aortic and branch vessel atherosclerosis. Normal heart size, without pericardial effusion. Three vessel coronary artery calcification. Mediastinum/Nodes: No supraclavicular adenopathy. No mediastinal or hilar adenopathy. Moderate hiatal hernia. Lungs/Pleura: No pleural fluid. Secretions in the dependent trachea. Right upper lobectomy. Advanced centrilobular emphysema. Right middle lobe wedge resection.  Similar lingular scarring. Bandlike opacity along the left major fissure is unchanged on 91/3 and also likely indicative of scarring.  Radiation fiducials in the posterior left apex with mild surrounding interstitial thickening. No residual or locally recurrent nodule. Upper Abdomen: Normal imaged portions of the liver, spleen, pancreas, adrenal glands. Interpolar left renal 1.6 cm low-density lesion is incompletely imaged but likely a cyst. Other left renal lesions are too small to characterize but most likely cysts . In the absence of clinically indicated signs/symptoms require(s) no independent follow-up. Musculoskeletal: Osteopenia. 7 posterolateral right rib fracture is incompletely healed, similar. Inferior right scapular sclerosis and expansion included on 77/2 are relatively similar and represent the site of a known treated osseous metastasis. Moderate T7 compression deformity is unchanged. IMPRESSION: 1. Multifocal surgical and radiation changes, without recurrent or metastatic disease. Resolution of multifocal pneumonia. 2. Similar inferior right scapular sclerosis, consistent with a site of treated metastasis. 3. Aortic atherosclerosis (ICD10-I70.0), coronary artery atherosclerosis and emphysema (ICD10-J43.9). 4. Moderate hiatal hernia Electronically Signed   By: Lore Rode M.D.   On: 12/28/2023 12:58

## 2023-12-30 ENCOUNTER — Other Ambulatory Visit: Payer: Self-pay

## 2024-01-06 DIAGNOSIS — I10 Essential (primary) hypertension: Secondary | ICD-10-CM | POA: Diagnosis not present

## 2024-01-06 DIAGNOSIS — I700 Atherosclerosis of aorta: Secondary | ICD-10-CM | POA: Diagnosis not present

## 2024-01-06 DIAGNOSIS — M1991 Primary osteoarthritis, unspecified site: Secondary | ICD-10-CM | POA: Diagnosis not present

## 2024-01-06 DIAGNOSIS — J449 Chronic obstructive pulmonary disease, unspecified: Secondary | ICD-10-CM | POA: Diagnosis not present

## 2024-01-07 DIAGNOSIS — M5134 Other intervertebral disc degeneration, thoracic region: Secondary | ICD-10-CM | POA: Diagnosis not present

## 2024-01-07 DIAGNOSIS — M48061 Spinal stenosis, lumbar region without neurogenic claudication: Secondary | ICD-10-CM | POA: Diagnosis not present

## 2024-01-07 DIAGNOSIS — J449 Chronic obstructive pulmonary disease, unspecified: Secondary | ICD-10-CM | POA: Diagnosis not present

## 2024-01-07 DIAGNOSIS — C3432 Malignant neoplasm of lower lobe, left bronchus or lung: Secondary | ICD-10-CM | POA: Diagnosis not present

## 2024-01-07 DIAGNOSIS — G8929 Other chronic pain: Secondary | ICD-10-CM | POA: Diagnosis not present

## 2024-01-07 DIAGNOSIS — M546 Pain in thoracic spine: Secondary | ICD-10-CM | POA: Diagnosis not present

## 2024-01-11 ENCOUNTER — Inpatient Hospital Stay: Admitting: Hematology

## 2024-01-11 ENCOUNTER — Inpatient Hospital Stay

## 2024-02-05 DIAGNOSIS — M546 Pain in thoracic spine: Secondary | ICD-10-CM | POA: Diagnosis not present

## 2024-02-05 DIAGNOSIS — M48061 Spinal stenosis, lumbar region without neurogenic claudication: Secondary | ICD-10-CM | POA: Diagnosis not present

## 2024-02-05 DIAGNOSIS — C3432 Malignant neoplasm of lower lobe, left bronchus or lung: Secondary | ICD-10-CM | POA: Diagnosis not present

## 2024-02-05 DIAGNOSIS — I10 Essential (primary) hypertension: Secondary | ICD-10-CM | POA: Diagnosis not present

## 2024-02-05 DIAGNOSIS — G8929 Other chronic pain: Secondary | ICD-10-CM | POA: Diagnosis not present

## 2024-02-05 DIAGNOSIS — M1991 Primary osteoarthritis, unspecified site: Secondary | ICD-10-CM | POA: Diagnosis not present

## 2024-02-05 DIAGNOSIS — J449 Chronic obstructive pulmonary disease, unspecified: Secondary | ICD-10-CM | POA: Diagnosis not present

## 2024-02-05 DIAGNOSIS — Z6822 Body mass index (BMI) 22.0-22.9, adult: Secondary | ICD-10-CM | POA: Diagnosis not present

## 2024-02-05 DIAGNOSIS — M5134 Other intervertebral disc degeneration, thoracic region: Secondary | ICD-10-CM | POA: Diagnosis not present

## 2024-02-06 DIAGNOSIS — J449 Chronic obstructive pulmonary disease, unspecified: Secondary | ICD-10-CM | POA: Diagnosis not present

## 2024-02-06 DIAGNOSIS — I10 Essential (primary) hypertension: Secondary | ICD-10-CM | POA: Diagnosis not present

## 2024-02-13 ENCOUNTER — Other Ambulatory Visit: Payer: Self-pay | Admitting: Hematology

## 2024-03-07 DIAGNOSIS — Z6823 Body mass index (BMI) 23.0-23.9, adult: Secondary | ICD-10-CM | POA: Diagnosis not present

## 2024-03-07 DIAGNOSIS — M546 Pain in thoracic spine: Secondary | ICD-10-CM | POA: Diagnosis not present

## 2024-03-07 DIAGNOSIS — G894 Chronic pain syndrome: Secondary | ICD-10-CM | POA: Diagnosis not present

## 2024-03-07 DIAGNOSIS — M5134 Other intervertebral disc degeneration, thoracic region: Secondary | ICD-10-CM | POA: Diagnosis not present

## 2024-03-07 DIAGNOSIS — Z0001 Encounter for general adult medical examination with abnormal findings: Secondary | ICD-10-CM | POA: Diagnosis not present

## 2024-03-07 DIAGNOSIS — Z1331 Encounter for screening for depression: Secondary | ICD-10-CM | POA: Diagnosis not present

## 2024-03-07 DIAGNOSIS — M48062 Spinal stenosis, lumbar region with neurogenic claudication: Secondary | ICD-10-CM | POA: Diagnosis not present

## 2024-03-07 DIAGNOSIS — Z125 Encounter for screening for malignant neoplasm of prostate: Secondary | ICD-10-CM | POA: Diagnosis not present

## 2024-03-07 DIAGNOSIS — C3432 Malignant neoplasm of lower lobe, left bronchus or lung: Secondary | ICD-10-CM | POA: Diagnosis not present

## 2024-03-07 DIAGNOSIS — Z9229 Personal history of other drug therapy: Secondary | ICD-10-CM | POA: Diagnosis not present

## 2024-03-07 DIAGNOSIS — I10 Essential (primary) hypertension: Secondary | ICD-10-CM | POA: Diagnosis not present

## 2024-03-07 DIAGNOSIS — J449 Chronic obstructive pulmonary disease, unspecified: Secondary | ICD-10-CM | POA: Diagnosis not present

## 2024-03-15 DIAGNOSIS — R7309 Other abnormal glucose: Secondary | ICD-10-CM | POA: Diagnosis not present

## 2024-03-15 DIAGNOSIS — Z9229 Personal history of other drug therapy: Secondary | ICD-10-CM | POA: Diagnosis not present

## 2024-03-15 DIAGNOSIS — C3432 Malignant neoplasm of lower lobe, left bronchus or lung: Secondary | ICD-10-CM | POA: Diagnosis not present

## 2024-03-15 DIAGNOSIS — Z0001 Encounter for general adult medical examination with abnormal findings: Secondary | ICD-10-CM | POA: Diagnosis not present

## 2024-03-15 DIAGNOSIS — E039 Hypothyroidism, unspecified: Secondary | ICD-10-CM | POA: Diagnosis not present

## 2024-03-15 DIAGNOSIS — E785 Hyperlipidemia, unspecified: Secondary | ICD-10-CM | POA: Diagnosis not present

## 2024-03-15 DIAGNOSIS — Z125 Encounter for screening for malignant neoplasm of prostate: Secondary | ICD-10-CM | POA: Diagnosis not present

## 2024-03-15 DIAGNOSIS — E559 Vitamin D deficiency, unspecified: Secondary | ICD-10-CM | POA: Diagnosis not present

## 2024-03-29 ENCOUNTER — Inpatient Hospital Stay: Attending: Physician Assistant

## 2024-03-29 ENCOUNTER — Other Ambulatory Visit: Payer: Self-pay | Admitting: Hematology

## 2024-03-29 ENCOUNTER — Ambulatory Visit (HOSPITAL_COMMUNITY)
Admission: RE | Admit: 2024-03-29 | Discharge: 2024-03-29 | Disposition: A | Discharge status: Home / Self Care | Source: Ambulatory Visit | Attending: Hematology | Admitting: Hematology

## 2024-03-29 DIAGNOSIS — Z95828 Presence of other vascular implants and grafts: Secondary | ICD-10-CM

## 2024-03-29 DIAGNOSIS — D649 Anemia, unspecified: Secondary | ICD-10-CM | POA: Diagnosis not present

## 2024-03-29 DIAGNOSIS — E538 Deficiency of other specified B group vitamins: Secondary | ICD-10-CM | POA: Diagnosis not present

## 2024-03-29 DIAGNOSIS — I700 Atherosclerosis of aorta: Secondary | ICD-10-CM | POA: Diagnosis not present

## 2024-03-29 DIAGNOSIS — E876 Hypokalemia: Secondary | ICD-10-CM | POA: Diagnosis not present

## 2024-03-29 DIAGNOSIS — R21 Rash and other nonspecific skin eruption: Secondary | ICD-10-CM | POA: Diagnosis not present

## 2024-03-29 DIAGNOSIS — E611 Iron deficiency: Secondary | ICD-10-CM | POA: Insufficient documentation

## 2024-03-29 DIAGNOSIS — Z79620 Long term (current) use of immunosuppressive biologic: Secondary | ICD-10-CM | POA: Diagnosis not present

## 2024-03-29 DIAGNOSIS — C3412 Malignant neoplasm of upper lobe, left bronchus or lung: Secondary | ICD-10-CM | POA: Insufficient documentation

## 2024-03-29 DIAGNOSIS — C7951 Secondary malignant neoplasm of bone: Secondary | ICD-10-CM | POA: Insufficient documentation

## 2024-03-29 DIAGNOSIS — N281 Cyst of kidney, acquired: Secondary | ICD-10-CM | POA: Insufficient documentation

## 2024-03-29 DIAGNOSIS — C3411 Malignant neoplasm of upper lobe, right bronchus or lung: Secondary | ICD-10-CM | POA: Diagnosis not present

## 2024-03-29 DIAGNOSIS — Z8042 Family history of malignant neoplasm of prostate: Secondary | ICD-10-CM | POA: Insufficient documentation

## 2024-03-29 DIAGNOSIS — Z87891 Personal history of nicotine dependence: Secondary | ICD-10-CM | POA: Diagnosis not present

## 2024-03-29 DIAGNOSIS — Z902 Acquired absence of lung [part of]: Secondary | ICD-10-CM | POA: Diagnosis not present

## 2024-03-29 LAB — CBC WITH DIFFERENTIAL/PLATELET
Abs Immature Granulocytes: 0.1 K/uL — ABNORMAL HIGH (ref 0.00–0.07)
Basophils Absolute: 0.1 K/uL (ref 0.0–0.1)
Basophils Relative: 1 %
Eosinophils Absolute: 0.1 K/uL (ref 0.0–0.5)
Eosinophils Relative: 1 %
HCT: 41.5 % (ref 39.0–52.0)
Hemoglobin: 14.1 g/dL (ref 13.0–17.0)
Immature Granulocytes: 1 %
Lymphocytes Relative: 7 %
Lymphs Abs: 0.8 K/uL (ref 0.7–4.0)
MCH: 34.6 pg — ABNORMAL HIGH (ref 26.0–34.0)
MCHC: 34 g/dL (ref 30.0–36.0)
MCV: 101.7 fL — ABNORMAL HIGH (ref 80.0–100.0)
Monocytes Absolute: 0.6 K/uL (ref 0.1–1.0)
Monocytes Relative: 5 %
Neutro Abs: 9.1 K/uL — ABNORMAL HIGH (ref 1.7–7.7)
Neutrophils Relative %: 85 %
Platelets: 245 K/uL (ref 150–400)
RBC: 4.08 MIL/uL — ABNORMAL LOW (ref 4.22–5.81)
RDW: 13.1 % (ref 11.5–15.5)
WBC: 10.7 K/uL — ABNORMAL HIGH (ref 4.0–10.5)
nRBC: 0 % (ref 0.0–0.2)

## 2024-03-29 LAB — COMPREHENSIVE METABOLIC PANEL WITH GFR
ALT: 21 U/L (ref 0–44)
AST: 22 U/L (ref 15–41)
Albumin: 3.9 g/dL (ref 3.5–5.0)
Alkaline Phosphatase: 58 U/L (ref 38–126)
Anion gap: 12 (ref 5–15)
BUN: 17 mg/dL (ref 8–23)
CO2: 23 mmol/L (ref 22–32)
Calcium: 8.4 mg/dL — ABNORMAL LOW (ref 8.9–10.3)
Chloride: 96 mmol/L — ABNORMAL LOW (ref 98–111)
Creatinine, Ser: 1.01 mg/dL (ref 0.61–1.24)
GFR, Estimated: >60 mL/min (ref >60)
Glucose, Bld: 126 mg/dL — ABNORMAL HIGH (ref 70–99)
Potassium: 2.8 mmol/L — ABNORMAL LOW (ref 3.5–5.1)
Sodium: 131 mmol/L — ABNORMAL LOW (ref 135–145)
Total Bilirubin: 0.9 mg/dL (ref 0.0–1.2)
Total Protein: 6.8 g/dL (ref 6.5–8.1)

## 2024-03-29 LAB — MAGNESIUM: Magnesium: 1.8 mg/dL (ref 1.7–2.4)

## 2024-03-29 LAB — TSH: TSH: 2.012 u[IU]/mL (ref 0.350–4.500)

## 2024-03-29 MED ORDER — SODIUM CHLORIDE 0.9% FLUSH
10.0000 mL | Freq: Once | INTRAVENOUS | Status: AC
  Administered 2024-03-29: 10 mL via INTRAVENOUS

## 2024-03-29 MED ORDER — IOHEXOL 300 MG/ML  SOLN
75.0000 mL | Freq: Once | INTRAMUSCULAR | Status: AC | PRN
  Administered 2024-03-29: 75 mL via INTRAVENOUS

## 2024-03-29 MED ORDER — HEPARIN SOD (PORK) LOCK FLUSH 100 UNIT/ML IV SOLN
500.0000 [IU] | Freq: Once | INTRAVENOUS | Status: AC
  Administered 2024-03-29: 500 [IU] via INTRAVENOUS

## 2024-03-29 NOTE — Progress Notes (Signed)
 Patients port flushed without difficulty.  Good blood return noted with no bruising or swelling noted at site. Labs drawn per orders. Port to be deaccessed by radiology, pt due for a CT scan today with contrast. VSS with discharge and left in satisfactory condition with no s/s of distress noted. All follow ups as scheduled.   Maurice Orozco

## 2024-03-30 ENCOUNTER — Other Ambulatory Visit: Payer: Self-pay | Admitting: *Deleted

## 2024-03-30 ENCOUNTER — Telehealth: Payer: Self-pay | Admitting: *Deleted

## 2024-03-30 NOTE — Telephone Encounter (Signed)
 Potassium 2.8 on 7/22.  Per Dr. Rogers, patient made aware to take 2 extra potassium today and stop hydrochlorothiazide.  Verbalized understanding.

## 2024-04-05 ENCOUNTER — Inpatient Hospital Stay (HOSPITAL_BASED_OUTPATIENT_CLINIC_OR_DEPARTMENT_OTHER): Admitting: Hematology

## 2024-04-05 VITALS — BP 134/65 | HR 81 | Temp 98.2°F | Resp 16 | Wt 146.8 lb

## 2024-04-05 DIAGNOSIS — E538 Deficiency of other specified B group vitamins: Secondary | ICD-10-CM | POA: Diagnosis not present

## 2024-04-05 DIAGNOSIS — E876 Hypokalemia: Secondary | ICD-10-CM | POA: Diagnosis not present

## 2024-04-05 DIAGNOSIS — C3411 Malignant neoplasm of upper lobe, right bronchus or lung: Secondary | ICD-10-CM | POA: Diagnosis not present

## 2024-04-05 DIAGNOSIS — C3412 Malignant neoplasm of upper lobe, left bronchus or lung: Secondary | ICD-10-CM

## 2024-04-05 DIAGNOSIS — C7951 Secondary malignant neoplasm of bone: Secondary | ICD-10-CM | POA: Diagnosis not present

## 2024-04-05 DIAGNOSIS — N281 Cyst of kidney, acquired: Secondary | ICD-10-CM | POA: Diagnosis not present

## 2024-04-05 NOTE — Patient Instructions (Addendum)
 [facility] Discharge Instructions   You were seen and examined today by Dr. Rogers.  He reviewed the results of your lab work which are normal/stable.   He reviewed the results of your CT scan which did not show any evidence of cancer. There is a spot on the lower lobe of the lung that is indeterminate. We will monitor closely.   We will see you back in 4 months. We will repeat lab work and and a CT scan prior to this visit.    Return as scheduled.    Thank you for choosing [facility] at [facility] to provide your oncology and hematology care.  To afford each patient quality time with our provider, please arrive at least 15 minutes before your scheduled appointment time.   If you have a lab appointment with the Cancer Center please come in thru the Main Entrance and check in at the main information desk.  You need to re-schedule your appointment should you arrive 10 or more minutes late.  We strive to give you quality time with our providers, and arriving late affects you and other patients whose appointments are after yours.  Also, if you no show three or more times for appointments you may be dismissed from the clinic at the providers discretion.     Again, thank you for choosing [facility].  Our hope is that these requests will decrease the amount of time that you wait before being seen by our physicians.       _____________________________________________________________  Should you have questions after your visit to [facility], please contact our office at [phone] and follow the prompts.  Our office hours are 8:00 a.m. and 4:30 p.m. Monday - Friday.  Please note that voicemails left after 4:00 p.m. may not be returned until the following business day.  We are closed weekends and major holidays.  You do have access to a nurse 24-7, just call the main number to the clinic [phone] and do  not press any options, hold on the line and a nurse will answer the phone.    For prescription refill requests, have your pharmacy contact our office and allow 72 hours.    Due to Covid, you will need to wear a mask upon entering the [facility]. If you do not have a mask, a mask will be given to you at the Main Entrance upon arrival. For doctor visits, patients may have 1 support person age 35 or older with them. For treatment visits, patients can not have anyone with them due to social distancing guidelines and our immunocompromised population.

## 2024-04-05 NOTE — Progress Notes (Signed)
 [facility] [address] [place] [address]    Clinic Day:  04/05/2024  Referring physician: Bertell Satterfield, MD  Patient Care Team: Bertell Satterfield, MD as PCP - General (Internal Medicine) Anner Alm ORN, MD as PCP - Cardiology (Cardiology) Golda Claudis PENNER, MD (Inactive) as Consulting Physician (Gastroenterology) Izell Domino, MD as Attending Physician (Radiation Oncology)   ASSESSMENT & PLAN:   Assessment: 1.  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 - Status post right upper lobectomy in February 2012 for right lung cancer. - LDCT chest (04/14/2022): Enlarging spiculated nodular soft tissue thickening in the posterior left upper lobe measuring 11.4 mm. - PET scan (04/24/2022): Left upper lobe lung nodule 14 mm with SUV 3.62.  Hypermetabolic focus in the inferior aspect of the right scapula without discrete CT correlate with SUV 5.71.  No hypermetabolic adenopathy. - LUL lesion FNA (06/03/2022): Adenocarcinoma - Right scapula biopsy (07/03/2022) adenocarcinoma consistent with lung primary - NGS: PD-L1 22 C3 TPS 100%, T p53 pathogenic variant, MSI-stable, TMB-low, no other targetable mutations - Cycle 1 of pembrolizumab  started on 07/21/2022, last dose of Keytruda  on 09/21/2023 held due to?  Pneumonitis/pneumonia on CT from 10/06/2023. - XRT to the right scapular lesion and left upper lobe lung lesion 5 fractions completed on 08/18/2022.   2.  Cystic lesion of left kidney - CTA PE on 04/14/2022 showed complex cystic lesion of the left kidney.  PET scan also showed exophytic 18 mm left renal lesion suspicious for malignancy. - MRI abdomen on 05/16/2022 showed benign appearing Bosniak category 1 and 2 left renal cyst with no evidence of neoplasm.  No further work-up needed.    Plan: 1.  Metastatic adenocarcinoma of the left upper lobe of the lung to the scapula: - Dyspnea on exertion is  stable.  Denies any cough or shortness of breath.  No new onset pains. - Labs from 03/29/2024: Normal LFTs.  Creatinine is stable.  CBC grossly normal.  TSH is 2.012. - CT chest (03/29/2024): Postsurgical changes without any evidence of recurrence.  Right lower lobe pleural-based subpleural nodule is slightly more conspicuous. - Will continue to hold off on pembrolizumab  at this time.  I will spread out his CT scan to 4 months.  If it is stable in 4 months, we will change CT scan to every 6 months.  I think we can restart him back on Keytruda  if there is any recurrence.   2.  Left posterior chest wall rash: - He is continue prednisone  5 mg daily.  Continue to use betamethasone  cream twice daily as needed.  He does not have any rash at this time.   3.  B12/iron deficiency: - Continue iron tablet daily.  He is not taking B12 tablet any more.   4.  Hypokalemia: - He was taking potassium 10 mill equivalents daily.  His potassium was low at 2.8.  He has increased it to 20 mill equivalents daily.    Orders Placed This Encounter  Procedures   CT CHEST W CONTRAST    Standing Status:   Future    Expected Date:   07/06/2024    Expiration Date:   04/05/2025    If indicated for the ordered procedure, I authorize the administration of contrast media per Radiology protocol:   Yes    Does the patient have a contrast media/X-ray dye allergy?:   No    Preferred imaging location?:  [facility]      I,Helena R Teague,acting as a scribe for Maurice Stands, MD.,have documented all relevant documentation on the behalf of Maurice Stands, MD,as directed by  Maurice Stands, MD while in the presence of Maurice Stands, MD.  I, Maurice Stands MD, have reviewed the above documentation for accuracy and completeness, and I agree with the above.     Maurice Stands, MD   7/29/20251:21 PM  CHIEF COMPLAINT:   Diagnosis: bilateral lung cancers, normocytic anemia, and B12  deficiency    Cancer Staging  Adenocarcinoma of lung (HCC) Staging form: Lung, AJCC 7th Edition - Clinical: Stage IA (T1a, N0, M0) - Signed by Berry Debby RAMAN, PA on 05/06/2011  Adenocarcinoma of upper lobe of left lung [facility]) Staging form: Lung, AJCC 8th Edition - Clinical stage from 07/16/2022: Stage IVA (cT1b, cN0, pM1b) - Unsigned    Prior Therapy: 1. Right upper lobectomy (February 2012) for right lung cancer 2. SBRT (November 2015) for stage I left adenocarcinoma  Current Therapy:  Pembrolizumab     HISTORY OF PRESENT ILLNESS:   Oncology History  Adenocarcinoma of lung (HCC)  09/30/2010 Cancer Staging   CT of chest showed spiculated lesion   10/15/2010 Surgery   Right upper lobectomy by Dr. Brantley   10/16/2010 Remission     10/24/2013 Imaging   CT Chest-  Enlarging subpleural nodule in the left lower lobe, now 9 mm, with distortion of the overlying pleura. Finding is concerning for a small bronchogenic carcinoma   01/23/2014 Imaging   CT Chest- Left lower lobe nodule has enlarged from 11/01/2012 and is worrisome for primary bronchogenic carcinoma.    07/04/2014 - 07/10/2014 Radiation Therapy   SBRT by Dr. Izell in 3 fractions.   07/05/2015 Imaging   perifissural nodule in LLL appears slightly smaller with parenchymal opacity attrib to XRT, likely inflamm pathcy airspace dz more inferiorly in LLL, airspace dz in RLL resolved. no metastatic disease   Primary cancer of left lower lobe of lung (HCC)  06/18/2014 Initial Diagnosis   Left lower lobe nodule suspicious for primary lung carcinoma   07/04/2014 - 07/10/2014 Radiation Therapy   SBRT   10/12/2014 Imaging   No acute findings within the chest. Decrease in size of left lower lobe perifissural nodule compatible with response to therapy. No new or progressive disease identified within the chest.   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)  07/16/2022 Initial Diagnosis   Adenocarcinoma of upper lobe of left lung  (HCC)   07/21/2022 -  Chemotherapy   Patient is on Treatment Plan : LUNG NSCLC Pembrolizumab  (200) q21d        INTERVAL HISTORY:   [name] is a 78 y.o. male presenting to clinic today for follow up of bilateral lung cancers, normocytic anemia, and B12 deficiency. He was last seen by me on 12/29/2023.  Since his last visit, he underwent CT chest on 03/29/2024 that found: Right upper lobectomy and wedge resection of right middle lobe. No suspicious findings to suggest recurrent malignancy, nodal or new nodal or distant metastasis within the field of view. Right lower lobe pleural based/subpleural nodule is slightly more conspicuous to prior exam from 2024, indeterminate. Interval resolution of lingular consolidation. Stable spiculated shaped scarring along the left major fissure. Few subcentimeter mediastinal lymph nodes.   Today, he states that he is doing well overall. His appetite level is at 75%. His energy level is at 50%. Zakariya is accompanied by his wife. He has gained 10 pounds  since his last visit. He is taking potassium BID. His breathing is normal.   Chrisotpher had a fall a few days after his birthday and had a cut above his right eyebrow that did not require him to go to the ED.   Misha is taking iron supplements as prescribed. His left chest wall rash has resolved, which he attributes to stopping a supplement.    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 dense RCA calcification  --Non-ischemic Myoview    History of kidney stones    in the 70's   History of radiation therapy 07/04/14, 07/06/14, 07/10/14   LLL lung nodule/54 Gy/3 fx- SBRT   Hypercholesterolemia 05/06/2011   Pneumonia due to Streptococcus    HX. POST OPERATIVELY   Port-A-Cath in place 07/21/2022   Pre-diabetes    Stress fx pelvis    DUE TO AA IN 1970    Surgical History: Past Surgical History:  Procedure Laterality Date   BACK SURGERY  07/04/2017   fusion above previous surgery   BACK SURGERY  2011   metal plate o-5,O4   BRONCHIAL BIOPSY  06/03/2022   Procedure: BRONCHIAL BIOPSIES;  Surgeon: Brenna Adine CROME, DO;  Location: [facility];  Service: Pulmonary;;   BRONCHIAL BRUSHINGS  06/03/2022   Procedure: BRONCHIAL BRUSHINGS;  Surgeon: Brenna Adine CROME, DO;  Location: [facility];  Service: Pulmonary;;   BRONCHIAL NEEDLE ASPIRATION BIOPSY  06/03/2022   Procedure: BRONCHIAL NEEDLE ASPIRATION BIOPSIES;  Surgeon: Brenna Adine CROME, DO;  Location: [facility];  Service: Pulmonary;;   Coroanry Calcium  Score  03/2017   1043. Noted especially in RCA. Dense calcification, recommend Myoview  over Cor CTA.   FIDUCIAL MARKER PLACEMENT  06/03/2022   Procedure: FIDUCIAL MARKER PLACEMENT;  Surgeon: Brenna Adine CROME, DO;  Location: [facility];  Service: Pulmonary;;   IR IMAGING GUIDED PORT INSERTION  08/04/2022   LIPOMA EXCISION N/A 10/14/2017   Procedure: EXCISION LIPOMA ON BACK;  Surgeon: Kallie Manuelita BROCKS, MD;  Location: [facility];  Service: General;  Laterality: N/A;   LIPOMA EXCISION Left 11/19/2018   Procedure: MINOR EXCISION OF SEBACEOUS CYST NECK;  Surgeon: Kallie Manuelita BROCKS, MD;  Location: [facility];  Service: General;  Laterality: Left;  pt knows to arrive at 7:00   LUNG LOBECTOMY  2012   RT UPPER LOBE   NM MYOVIEW  LTD  04/2017   EF 60%. Hypertensive response to exercise. (6:21 min, 7.7 METs) No EKG changes. LOW RISK    Social History: Social History   Socioeconomic History   Marital status:  Married    Spouse name: Not on file   Number of children: 3   Years of education: Not on file   Highest education level: Not on file  Occupational History   Not on file  Tobacco Use   Smoking status: Former    Current packs/day: 0.00    Average  packs/day: 1.5 packs/day for 59.0 years (88.5 ttl pk-yrs)    Types: Cigarettes    Start date: 09/09/1951    Quit date: 09/08/2010    Years since quitting: 13.5   Smokeless tobacco: Current    Types: Chew   Tobacco comments:    still chews tobacco, he tells me he hasn't in the past 2 days.   Vaping Use   Vaping status: Never Used  Substance and Sexual Activity   Alcohol use: No   Drug use: No   Sexual activity: Not Currently  Other Topics Concern   Not on file  Social History Narrative   Not on file   Social Drivers of Health   Financial Resource Strain: Not on file  Food Insecurity: Not on file  Transportation Needs: No Transportation Needs (07/13/2018)   PRAPARE - Administrator, Civil Service (Medical): No    Lack of Transportation (Non-Medical): No  Physical Activity: Not on file  Stress: Not on file  Social Connections: Not on file  Intimate Partner Violence: Not At Risk (07/13/2018)   Humiliation, Afraid, Rape, and Kick questionnaire    Fear of Current or Ex-Partner: No    Emotionally Abused: No    Physically Abused: No    Sexually Abused: No    Family History: Family History  Problem Relation Age of Onset   Hypertension Mother    Kidney disease Father    Heart attack Brother    Cancer Maternal Uncle        prostate cancer   Cancer Maternal Uncle        prostate cancer   Cancer Maternal Uncle        prostate cancer   Cancer Maternal Uncle        prostate cancer    Current Medications:  Current Outpatient Medications:    albuterol  (PROVENTIL  HFA;VENTOLIN  HFA) 108 (90 Base) MCG/ACT inhaler, Inhale 2 puffs into the lungs every 4 (four) hours as needed for wheezing or shortness of breath., Disp: 2  Inhaler, Rfl: 3   betamethasone  dipropionate 0.05 % cream, APPLY TOPICALLY TWICE A DAY, Disp: 30 g, Rfl: 1   cetirizine (ZYRTEC) 10 MG tablet, Take 10 mg by mouth daily., Disp: , Rfl:    cholecalciferol  (VITAMIN D3) 25 MCG (1000 UNIT) tablet, Take 1,000 Units by mouth daily., Disp: , Rfl:    cimetidine (TAGAMET) 200 MG tablet, Take 400-600 mg by mouth daily as needed (for heartburn)., Disp: , Rfl:    clobetasol ointment (TEMOVATE) 0.05 %, Apply 1 Application topically daily as needed (itching)., Disp: , Rfl:    DULoxetine (CYMBALTA) 30 MG capsule, Take 30 mg by mouth daily., Disp: , Rfl:    ferrous sulfate 325 (65 FE) MG tablet, Take 325 mg by mouth daily with breakfast., Disp: , Rfl:    fluticasone  (FLONASE ) 50 MCG/ACT nasal spray, Place 2 sprays into both nostrils daily as needed for rhinitis., Disp: , Rfl:    Glycopyrrolate-Formoterol (BEVESPI AEROSPHERE) 9-4.8 MCG/ACT AERO, Inhale 2 puffs into the lungs 2 (two) times daily., Disp: , Rfl:    guaiFENesin  (MUCINEX ) 600 MG 12 hr tablet, Take 600 mg by mouth daily., Disp: , Rfl:    HYDROcodone -acetaminophen  (NORCO) 10-325 MG tablet, Take 1-2 tablets by mouth every 4 (four) hours., Disp: , Rfl:    hydrOXYzine  (VISTARIL ) 25 MG capsule, Take 25-50 mg by mouth every 6 (six) hours as needed for itching., Disp: , Rfl:    KLOR-CON  M10 10 MEQ tablet, Take 20  mEq by mouth daily., Disp: , Rfl:    lidocaine -prilocaine  (EMLA ) cream, APPLY A SMALL AMOUNT TO PORT A CATH SITE AND COVER WITH PLASTIC WRAP 1 HOUR PRIOR TO INFUSION APPOINTMENTS, Disp: 30 g, Rfl: 3   naproxen sodium (ALEVE) 220 MG tablet, Take 220 mg by mouth daily as needed (pain)., Disp: , Rfl:    Omega-3 Fatty Acids (FISH OIL) 1000 MG CAPS, Take 1,000 mg by mouth daily., Disp: , Rfl:    ondansetron  (ZOFRAN ) 4 MG tablet, Take 1 tablet (4 mg total) by mouth every 8 (eight) hours as needed., Disp: 30 tablet, Rfl: 1   pantoprazole  (PROTONIX ) 40 MG tablet, Take 40 mg by mouth See admin instructions. Take  40 mg daily, may take a second 40 mg dose as needed for heartburn, Disp: , Rfl:    PEMBROLIZUMAB  IV, Inject into the vein every 21 ( twenty-one) days., Disp: , Rfl:    polyethylene glycol powder (GLYCOLAX/MIRALAX) 17 GM/SCOOP powder, Take 17 g by mouth daily as needed for mild constipation or moderate constipation., Disp: , Rfl:    predniSONE  (DELTASONE ) 5 MG tablet, TAKE 1 TABLET BY MOUTH EVERY DAY WITH BREAKFAST, Disp: 30 tablet, Rfl: 4   rosuvastatin  (CRESTOR ) 40 MG tablet, Take 40 mg by mouth at bedtime., Disp: , Rfl:    tamsulosin  (FLOMAX ) 0.4 MG CAPS capsule, TAKE 1 CAPSULE BY MOUTH EVERY DAY, Disp: 90 capsule, Rfl: 1   triamcinolone  ointment (KENALOG ) 0.1 %, Apply topically 2 (two) times daily as needed., Disp: , Rfl:    zolpidem (AMBIEN) 5 MG tablet, Take 5 mg by mouth daily., Disp: , Rfl:  No current facility-administered medications for this visit.  Facility-Administered Medications Ordered in Other Visits:    sodium chloride  flush (NS) 0.9 % injection 10 mL, 10 mL, Intracatheter, PRN, Briant Angelillo, MD, 10 mL at 10/07/22 1523   sodium chloride  flush (NS) 0.9 % injection 10 mL, 10 mL, Intracatheter, PRN, Lucindy Borel, MD, 10 mL at 10/28/22 1446   sodium chloride  flush (NS) 0.9 % injection 10 mL, 10 mL, Intracatheter, PRN, Tyger Wichman, MD, 10 mL at 11/19/22 1547   Allergies: Allergies  Allergen Reactions   Fentanyl  Other (See Comments)    CAUSED HALLUCINATIONS/05-03-13 pt reports it was oral fentanyl  that caused hallucinations    Gabapentin Other (See Comments)    Bones throbbed, headaches, weakness   Nabumetone     Unknown reaction    Amoxicillin  Rash   Aspirin Nausea Only   Ciprofloxacin Hcl Rash    REVIEW OF SYSTEMS:   Review of Systems  Constitutional:  Negative for chills, fatigue and fever.  HENT:   Negative for lump/mass, mouth sores, nosebleeds, sore throat and trouble swallowing.   Eyes:  Negative for eye problems.  Respiratory:  Positive  for cough and shortness of breath.   Cardiovascular:  Negative for chest pain, leg swelling and palpitations.  Gastrointestinal:  Negative for abdominal pain, constipation, diarrhea, nausea and vomiting.  Genitourinary:  Negative for bladder incontinence, difficulty urinating, dysuria, frequency, hematuria and nocturia.   Musculoskeletal:  Positive for back pain (8/10 severity). Negative for arthralgias, flank pain, myalgias and neck pain.  Skin:  Negative for itching and rash.  Neurological:  Positive for dizziness and headaches. Negative for numbness.  Hematological:  Does not bruise/bleed easily.  Psychiatric/Behavioral:  Negative for depression, sleep disturbance and suicidal ideas. The patient is not nervous/anxious.   All other systems reviewed and are negative.    VITALS:   Blood pressure 134/65, pulse  81, temperature 98.2 F (36.8 C), temperature source Oral, resp. rate 16, weight 146 lb 13.2 oz (66.6 kg), SpO2 98%.  Wt Readings from Last 3 Encounters:  04/05/24 146 lb 13.2 oz (66.6 kg)  03/29/24 143 lb 1.3 oz (64.9 kg)  12/29/23 137 lb 5.6 oz (62.3 kg)    Body mass index is 23.7 kg/m.  Performance status (ECOG): 1 - Symptomatic but completely ambulatory  PHYSICAL EXAM:   Physical Exam Vitals and nursing note reviewed. Exam conducted with a chaperone present.  Constitutional:      Appearance: Normal appearance.  Cardiovascular:     Rate and Rhythm: Normal rate and regular rhythm.     Pulses: Normal pulses.     Heart sounds: Normal heart sounds.  Pulmonary:     Effort: Pulmonary effort is normal.     Breath sounds: Normal breath sounds.  Abdominal:     Palpations: Abdomen is soft. There is no hepatomegaly, splenomegaly or mass.     Tenderness: There is no abdominal tenderness.  Musculoskeletal:     Right lower leg: No edema.     Left lower leg: No edema.  Lymphadenopathy:     Cervical: No cervical adenopathy.     Right cervical: No superficial, deep or posterior  cervical adenopathy.    Left cervical: No superficial, deep or posterior cervical adenopathy.     Upper Body:     Right upper body: No supraclavicular or axillary adenopathy.     Left upper body: No supraclavicular or axillary adenopathy.  Neurological:     General: No focal deficit present.     Mental Status: He is alert and oriented to person, place, and time.  Psychiatric:        Mood and Affect: Mood normal.        Behavior: Behavior normal.     LABS:   CBC     Component Value Date/Time   WBC 10.7 (H) 03/29/2024 1203   RBC 4.08 (L) 03/29/2024 1203   HGB 14.1 03/29/2024 1203   HGB 13.5 05/03/2023 1601   HCT 41.5 03/29/2024 1203   HCT 39.8 05/03/2023 1601   PLT 245 03/29/2024 1203   PLT 266 05/03/2023 1601   MCV 101.7 (H) 03/29/2024 1203   MCV 98 (H) 05/03/2023 1601   MCH 34.6 (H) 03/29/2024 1203   MCHC 34.0 03/29/2024 1203   RDW 13.1 03/29/2024 1203   RDW 11.6 05/03/2023 1601   LYMPHSABS 0.8 03/29/2024 1203   LYMPHSABS 0.5 (L) 05/03/2023 1601   MONOABS 0.6 03/29/2024 1203   EOSABS 0.1 03/29/2024 1203   EOSABS 0.1 05/03/2023 1601   BASOSABS 0.1 03/29/2024 1203   BASOSABS 0.1 05/03/2023 1601    CMP      Component Value Date/Time   NA 131 (L) 03/29/2024 1203   NA 136 05/03/2023 1601   K 2.8 (L) 03/29/2024 1203   CL 96 (L) 03/29/2024 1203   CO2 23 03/29/2024 1203   GLUCOSE 126 (H) 03/29/2024 1203   BUN 17 03/29/2024 1203   BUN 18 05/03/2023 1601   BUN 23.9 07/05/2015 1239   CREATININE 1.01 03/29/2024 1203   CREATININE 0.9 07/05/2015 1239   CALCIUM  8.4 (L) 03/29/2024 1203   PROT 6.8 03/29/2024 1203   PROT 6.5 05/03/2023 1601   ALBUMIN 3.9 03/29/2024 1203   ALBUMIN 4.3 05/03/2023 1601   AST 22 03/29/2024 1203   ALT 21 03/29/2024 1203   ALKPHOS 58 03/29/2024 1203   BILITOT 0.9 03/29/2024 1203   BILITOT  0.2 05/03/2023 1601   GFRNONAA >60 03/29/2024 1203   GFRAA >60 05/29/2020 1407     No results found for: CEA1, CEA / No results found for:  CEA1, CEA No results found for: PSA1 No results found for: CAN199 No results found for: CAN125  No results found for: TOTALPROTELP, ALBUMINELP, A1GS, A2GS, BETS, BETA2SER, GAMS, MSPIKE, SPEI Lab Results  Component Value Date   TIBC 310 09/30/2022   TIBC 323 04/14/2022   TIBC 373 10/01/2021   FERRITIN 108 09/30/2022   FERRITIN 65 04/14/2022   FERRITIN 49 10/01/2021   IRONPCTSAT 13 (L) 09/30/2022   IRONPCTSAT 22 04/14/2022   IRONPCTSAT 27 10/01/2021   Lab Results  Component Value Date   LDH 120 06/28/2021   LDH 130 12/20/2020   LDH 97 (L) 07/12/2018     STUDIES:   CT CHEST W CONTRAST Result Date: 03/29/2024 CLINICAL DATA:  Left upper lobe adenocarcinoma EXAM: CT CHEST WITH CONTRAST TECHNIQUE: Multidetector CT imaging of the chest was performed during intravenous contrast administration. RADIATION DOSE REDUCTION: This exam was performed according to the departmental dose-optimization program which includes automated exposure control, adjustment of the mA and/or kV according to patient size and/or use of iterative reconstruction technique. CONTRAST:  75mL OMNIPAQUE  IOHEXOL  300 MG/ML  SOLN COMPARISON:  Chest CT December 21, 2023, June 11, 2023 CT chest abdomen pelvis. FINDINGS: Cardiovascular: Right sided Port-A-Cath tip mid right atrium. Severe aortic and coronary vessel atherosclerosis. Normal heart size, without pericardial effusion. Mediastinum/Nodes: No supraclavicular adenopathy. Multi station subcentimeter mediastinal lymph nodes similar to prior . Moderate size hiatal hernia. Lungs/Pleura: Status post right upper lobectomy. No suspicious findings to suggest recurrent malignancy at the surgical stump. There is diffuse bronchial and bronchiolar wall thickening. Severe centrilobular upper lobe predominant emphysematous changes. Status post right middle lobe wedge resection. Similar lingular scarring. Redemonstration of a focal scarring measuring 2 x 6 cm along  the left major fissure in left lower lobe (6/132). This finding is stable to prior CT from June 11, 2023. There is a pleural base/pleural nodule measuring 7 mm, previously measured 6 mm. (3/58). A consolidation in left upper lobe is resolved. Subsegmental atelectasis of lingula is stable. Fiducial markers identified in left upper lobe. No pleural effusion. Forty-six regions within the trachea lumen right posterolateral tracheal diverticulum measuring 4 mm. Upper Abdomen: Few bilateral renal and does not require imaging follow-up simple cortical cysts, stable to prior. Visualized liver and spleen are unremarkable. Pancreas and adrenal glands are normal. Musculoskeletal: Compression wedge deformity of T7 vertebral body, stable to prior. Posterior spinal fusion device. Stable healing fracture of the right seventh rib and sclerotic changes of overlying scapula likely posttreatment/postradiation changes. IMPRESSION: 1. Right upper lobectomy and wedge resection of right middle lobe. No suspicious findings to suggest recurrent malignancy, nodal or new nodal or distant metastasis within the field of view. 2. Right lower lobe pleural based/subpleural nodule is slightly more conspicuous to prior exam from 2024, indeterminate. Follow-up according to oncologic protocols. 3.  Interval resolution of lingular consolidation. 4. Stable spiculated shaped scarring along the left major fissure. Recommend close attention on follow-up exam to ensure stability. Few subcentimeter mediastinal lymph nodes Electronically Signed   By: Duwaine Severs M.D.   On: 03/29/2024 15:05

## 2024-04-06 ENCOUNTER — Other Ambulatory Visit: Payer: Self-pay

## 2024-04-06 DIAGNOSIS — Z6823 Body mass index (BMI) 23.0-23.9, adult: Secondary | ICD-10-CM | POA: Diagnosis not present

## 2024-04-06 DIAGNOSIS — G8929 Other chronic pain: Secondary | ICD-10-CM | POA: Diagnosis not present

## 2024-04-06 DIAGNOSIS — S0181XA Laceration without foreign body of other part of head, initial encounter: Secondary | ICD-10-CM | POA: Diagnosis not present

## 2024-04-06 DIAGNOSIS — S0990XA Unspecified injury of head, initial encounter: Secondary | ICD-10-CM | POA: Diagnosis not present

## 2024-04-06 DIAGNOSIS — J449 Chronic obstructive pulmonary disease, unspecified: Secondary | ICD-10-CM | POA: Diagnosis not present

## 2024-04-06 DIAGNOSIS — M48061 Spinal stenosis, lumbar region without neurogenic claudication: Secondary | ICD-10-CM | POA: Diagnosis not present

## 2024-04-06 DIAGNOSIS — C3432 Malignant neoplasm of lower lobe, left bronchus or lung: Secondary | ICD-10-CM | POA: Diagnosis not present

## 2024-04-06 DIAGNOSIS — M546 Pain in thoracic spine: Secondary | ICD-10-CM | POA: Diagnosis not present

## 2024-04-06 DIAGNOSIS — Z9229 Personal history of other drug therapy: Secondary | ICD-10-CM | POA: Diagnosis not present

## 2024-04-07 DIAGNOSIS — J449 Chronic obstructive pulmonary disease, unspecified: Secondary | ICD-10-CM | POA: Diagnosis not present

## 2024-04-07 DIAGNOSIS — E785 Hyperlipidemia, unspecified: Secondary | ICD-10-CM | POA: Diagnosis not present

## 2024-05-06 DIAGNOSIS — M5134 Other intervertebral disc degeneration, thoracic region: Secondary | ICD-10-CM | POA: Diagnosis not present

## 2024-05-06 DIAGNOSIS — M48061 Spinal stenosis, lumbar region without neurogenic claudication: Secondary | ICD-10-CM | POA: Diagnosis not present

## 2024-05-06 DIAGNOSIS — Z6822 Body mass index (BMI) 22.0-22.9, adult: Secondary | ICD-10-CM | POA: Diagnosis not present

## 2024-05-06 DIAGNOSIS — J449 Chronic obstructive pulmonary disease, unspecified: Secondary | ICD-10-CM | POA: Diagnosis not present

## 2024-05-06 DIAGNOSIS — G8929 Other chronic pain: Secondary | ICD-10-CM | POA: Diagnosis not present

## 2024-05-06 DIAGNOSIS — M546 Pain in thoracic spine: Secondary | ICD-10-CM | POA: Diagnosis not present

## 2024-05-06 DIAGNOSIS — C3432 Malignant neoplasm of lower lobe, left bronchus or lung: Secondary | ICD-10-CM | POA: Diagnosis not present

## 2024-05-10 DIAGNOSIS — R5381 Other malaise: Secondary | ICD-10-CM | POA: Diagnosis not present

## 2024-05-31 ENCOUNTER — Other Ambulatory Visit: Payer: Self-pay | Admitting: *Deleted

## 2024-06-01 DIAGNOSIS — Z8709 Personal history of other diseases of the respiratory system: Secondary | ICD-10-CM | POA: Diagnosis not present

## 2024-06-01 DIAGNOSIS — M5442 Lumbago with sciatica, left side: Secondary | ICD-10-CM | POA: Diagnosis not present

## 2024-06-01 DIAGNOSIS — Z79899 Other long term (current) drug therapy: Secondary | ICD-10-CM | POA: Diagnosis not present

## 2024-06-01 DIAGNOSIS — Z7689 Persons encountering health services in other specified circumstances: Secondary | ICD-10-CM | POA: Diagnosis not present

## 2024-06-01 DIAGNOSIS — G8929 Other chronic pain: Secondary | ICD-10-CM | POA: Diagnosis not present

## 2024-06-01 DIAGNOSIS — F5104 Psychophysiologic insomnia: Secondary | ICD-10-CM | POA: Diagnosis not present

## 2024-06-01 DIAGNOSIS — C3491 Malignant neoplasm of unspecified part of right bronchus or lung: Secondary | ICD-10-CM | POA: Diagnosis not present

## 2024-06-13 ENCOUNTER — Other Ambulatory Visit: Payer: Self-pay | Admitting: *Deleted

## 2024-06-13 MED ORDER — TAMSULOSIN HCL 0.4 MG PO CAPS: 0.4000 mg | ORAL_CAPSULE | Freq: Every day | ORAL | 1 refills | Status: AC

## 2024-06-22 ENCOUNTER — Encounter (INDEPENDENT_AMBULATORY_CARE_PROVIDER_SITE_OTHER): Payer: Self-pay | Admitting: Gastroenterology

## 2024-07-04 ENCOUNTER — Inpatient Hospital Stay: Attending: Physician Assistant

## 2024-07-04 ENCOUNTER — Ambulatory Visit (HOSPITAL_COMMUNITY)
Admission: RE | Admit: 2024-07-04 | Discharge: 2024-07-04 | Disposition: A | Source: Ambulatory Visit | Attending: Hematology | Admitting: Hematology

## 2024-07-04 DIAGNOSIS — C3412 Malignant neoplasm of upper lobe, left bronchus or lung: Secondary | ICD-10-CM | POA: Insufficient documentation

## 2024-07-04 DIAGNOSIS — K449 Diaphragmatic hernia without obstruction or gangrene: Secondary | ICD-10-CM | POA: Diagnosis not present

## 2024-07-04 DIAGNOSIS — C7951 Secondary malignant neoplasm of bone: Secondary | ICD-10-CM | POA: Insufficient documentation

## 2024-07-04 DIAGNOSIS — I10 Essential (primary) hypertension: Secondary | ICD-10-CM | POA: Insufficient documentation

## 2024-07-04 DIAGNOSIS — Z95828 Presence of other vascular implants and grafts: Secondary | ICD-10-CM

## 2024-07-04 DIAGNOSIS — J439 Emphysema, unspecified: Secondary | ICD-10-CM | POA: Diagnosis not present

## 2024-07-04 DIAGNOSIS — C3490 Malignant neoplasm of unspecified part of unspecified bronchus or lung: Secondary | ICD-10-CM | POA: Diagnosis not present

## 2024-07-04 LAB — COMPREHENSIVE METABOLIC PANEL WITH GFR
ALT: 37 U/L (ref 0–44)
AST: 38 U/L (ref 15–41)
Albumin: 4.1 g/dL (ref 3.5–5.0)
Alkaline Phosphatase: 78 U/L (ref 38–126)
Anion gap: 13 (ref 5–15)
BUN: 17 mg/dL (ref 8–23)
CO2: 25 mmol/L (ref 22–32)
Calcium: 8.7 mg/dL — ABNORMAL LOW (ref 8.9–10.3)
Chloride: 100 mmol/L (ref 98–111)
Creatinine, Ser: 0.94 mg/dL (ref 0.61–1.24)
GFR, Estimated: >60 mL/min (ref >60)
Glucose, Bld: 104 mg/dL — ABNORMAL HIGH (ref 70–99)
Potassium: 3.1 mmol/L — ABNORMAL LOW (ref 3.5–5.1)
Sodium: 138 mmol/L (ref 135–145)
Total Bilirubin: 0.5 mg/dL (ref 0.0–1.2)
Total Protein: 6.4 g/dL — ABNORMAL LOW (ref 6.5–8.1)

## 2024-07-04 LAB — CBC WITH DIFFERENTIAL/PLATELET
Abs Immature Granulocytes: 0.07 K/uL (ref 0.00–0.07)
Basophils Absolute: 0.1 K/uL (ref 0.0–0.1)
Basophils Relative: 1 %
Eosinophils Absolute: 0.2 K/uL (ref 0.0–0.5)
Eosinophils Relative: 3 %
HCT: 42 % (ref 39.0–52.0)
Hemoglobin: 14.2 g/dL (ref 13.0–17.0)
Immature Granulocytes: 1 %
Lymphocytes Relative: 17 %
Lymphs Abs: 1.1 K/uL (ref 0.7–4.0)
MCH: 34.9 pg — ABNORMAL HIGH (ref 26.0–34.0)
MCHC: 33.8 g/dL (ref 30.0–36.0)
MCV: 103.2 fL — ABNORMAL HIGH (ref 80.0–100.0)
Monocytes Absolute: 0.5 K/uL (ref 0.1–1.0)
Monocytes Relative: 8 %
Neutro Abs: 4.7 K/uL (ref 1.7–7.7)
Neutrophils Relative %: 70 %
Platelets: 228 K/uL (ref 150–400)
RBC: 4.07 MIL/uL — ABNORMAL LOW (ref 4.22–5.81)
RDW: 12.1 % (ref 11.5–15.5)
WBC: 6.8 K/uL (ref 4.0–10.5)
nRBC: 0 % (ref 0.0–0.2)

## 2024-07-04 LAB — MAGNESIUM: Magnesium: 2 mg/dL (ref 1.7–2.4)

## 2024-07-04 LAB — TSH: TSH: 1.99 u[IU]/mL (ref 0.350–4.500)

## 2024-07-04 MED ORDER — HEPARIN SOD (PORK) LOCK FLUSH 100 UNIT/ML IV SOLN
INTRAVENOUS | Status: AC
  Filled 2024-07-04: qty 5

## 2024-07-04 MED ORDER — HEPARIN SOD (PORK) LOCK FLUSH 100 UNIT/ML IV SOLN
500.0000 [IU] | Freq: Once | INTRAVENOUS | Status: AC
  Administered 2024-07-04: 500 [IU] via INTRAVENOUS

## 2024-07-04 MED ORDER — IOHEXOL 300 MG/ML  SOLN
75.0000 mL | Freq: Once | INTRAMUSCULAR | Status: AC | PRN
  Administered 2024-07-04: 75 mL via INTRAVENOUS

## 2024-07-07 DIAGNOSIS — I10 Essential (primary) hypertension: Secondary | ICD-10-CM | POA: Diagnosis not present

## 2024-07-12 ENCOUNTER — Inpatient Hospital Stay: Attending: Physician Assistant | Admitting: Oncology

## 2024-07-12 VITALS — BP 124/70 | HR 96 | Temp 98.4°F | Resp 18 | Ht 66.0 in | Wt 145.0 lb

## 2024-07-12 DIAGNOSIS — Z85118 Personal history of other malignant neoplasm of bronchus and lung: Secondary | ICD-10-CM | POA: Diagnosis not present

## 2024-07-12 DIAGNOSIS — Z902 Acquired absence of lung [part of]: Secondary | ICD-10-CM | POA: Insufficient documentation

## 2024-07-12 DIAGNOSIS — C3412 Malignant neoplasm of upper lobe, left bronchus or lung: Secondary | ICD-10-CM | POA: Diagnosis not present

## 2024-07-12 DIAGNOSIS — Z95828 Presence of other vascular implants and grafts: Secondary | ICD-10-CM

## 2024-07-12 DIAGNOSIS — D509 Iron deficiency anemia, unspecified: Secondary | ICD-10-CM | POA: Diagnosis not present

## 2024-07-12 DIAGNOSIS — Z923 Personal history of irradiation: Secondary | ICD-10-CM | POA: Diagnosis not present

## 2024-07-12 DIAGNOSIS — E538 Deficiency of other specified B group vitamins: Secondary | ICD-10-CM

## 2024-07-12 NOTE — Progress Notes (Unsigned)
 [facility] [address] [place] [address]    Clinic Day:  07/13/2024  Referring physician: Bertell Satterfield, MD  Patient Care Team: Bertell Satterfield, MD as PCP - General (Internal Medicine) Anner Alm ORN, MD as PCP - Cardiology (Cardiology) Golda Claudis PENNER, MD (Inactive) as Consulting Physician (Gastroenterology) Izell Domino, MD as Attending Physician (Radiation Oncology)   ASSESSMENT & PLAN:   Assessment: 1.  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 - Status post right upper lobectomy in February 2012 for right lung cancer. - LDCT chest (04/14/2022): Enlarging spiculated nodular soft tissue thickening in the posterior left upper lobe measuring 11.4 mm. - PET scan (04/24/2022): Left upper lobe lung nodule 14 mm with SUV 3.62.  Hypermetabolic focus in the inferior aspect of the right scapula without discrete CT correlate with SUV 5.71.  No hypermetabolic adenopathy. - LUL lesion FNA (06/03/2022): Adenocarcinoma - Right scapula biopsy (07/03/2022) adenocarcinoma consistent with lung primary - NGS: PD-L1 22 C3 TPS 100%, T p53 pathogenic variant, MSI-stable, TMB-low, no other targetable mutations - Cycle 1 of pembrolizumab  started on 07/21/2022, last dose of Keytruda  on 09/21/2023 held due to?  Pneumonitis/pneumonia on CT from 10/06/2023. - XRT to the right scapular lesion and left upper lobe lung lesion 5 fractions completed on 08/18/2022.   2.  Cystic lesion of left kidney - CTA PE on 04/14/2022 showed complex cystic lesion of the left kidney.  PET scan also showed exophytic 18 mm left renal lesion suspicious for malignancy. - MRI abdomen on 05/16/2022 showed benign appearing Bosniak category 1 and 2 left renal cyst with no evidence of neoplasm.  No further work-up needed.    Plan: 1.  Metastatic adenocarcinoma of the left upper lobe of the lung to the scapula: - Dyspnea on exertion is  stable.  Denies any cough or shortness of breath.  No new onset pains. - Labs from 07/04/24: Normal LFTs.  Creatinine is stable.  CBC grossly normal.  TSH is 2.012. - CT chest (07/04/24): Surgical and radiation changes again noted. No findings for recurrent tumor, mediastinal/hilar adenopathy or pulmonary metastatic disease. Stable left upper lobe fiducial with some surrounding stranding interstitial change and scarring. No discrete mass or nodule. Stable advanced emphysematous changes and areas of pulmonary scarring. Stable large hiatal hernia. Stable advanced atherosclerotic calcifications including three-vessel coronary artery calcifications. Stable inferior scapular sclerosis and cortical thickening, likely treated metastatic disease. Stable ununited seventh right posterior rib fracture. New/acute fracture involving the anterolateral aspect of the seventh rib. - Will continue to hold off on pembrolizumab  at this time.  I will spread out his CT scan to 4 months.  If it is stable in 4 months, we will change CT scan to every 6 months.  I think we can restart him back on Keytruda  if there is any recurrence.   2.  Left posterior chest wall rash: - resolved.   3.  B12/iron deficiency: - Continue iron tablet daily.  He is not taking B12 tablet any more.   4.  Hypokalemia: - He continues potassium supplements. -Most recent potassium is 3.1. -Continue potassium 20 mill equivalents daily per PCP.  5.  Recent fall: -Patient reports falling on his right arm and shoulder about 3 weeks ago.  Reports he tripped on a walnut.  He has pain in his right upper back rib area. -Most recent imaging shows new acute fracture involving the anterior lateral aspect of the  seventh rib. -Discussed ibuprofen, heat and topical creams such as IcyHot may be helpful.    Orders Placed This Encounter  Procedures   CT CHEST W CONTRAST    Standing Status:   Future    Expected Date:   11/09/2024    Expiration Date:   07/12/2025     If indicated for the ordered procedure, I authorize the administration of contrast media per Radiology protocol:   Yes    Does the patient have a contrast media/X-ray dye allergy?:   No    Preferred imaging location?:   [facility]   TSH    Standing Status:   Future    Expected Date:   11/10/2024    Expiration Date:   02/08/2025   Comprehensive metabolic panel    Standing Status:   Future    Expected Date:   11/10/2024    Expiration Date:   02/08/2025   CBC with Differential    Standing Status:   Future    Expected Date:   11/10/2024    Expiration Date:   02/08/2025   Magnesium     Standing Status:   Future    Expected Date:   11/10/2024    Expiration Date:   02/08/2025   Methylmalonic acid, serum    Standing Status:   Future    Expected Date:   11/10/2024    Expiration Date:   02/08/2025   Vitamin B12    Standing Status:   Future    Expected Date:   11/10/2024    Expiration Date:   02/08/2025   Iron and TIBC ([facility] ONLY)    Standing Status:   Future    Expected Date:   11/10/2024    Expiration Date:   02/08/2025     Maurice FORBES Hope, NP   11/5/20257:24 AM  CHIEF COMPLAINT:   Diagnosis: bilateral lung cancers, normocytic anemia, and B12 deficiency    Cancer Staging  Adenocarcinoma of lung (HCC) Staging form: Lung, AJCC 7th Edition - Clinical: Stage IA (T1a, N0, M0) - Signed by Berry Debby RAMAN, PA on 05/06/2011  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) Staging form: Lung, AJCC 8th Edition - Clinical stage from 07/16/2022: Stage IVA (cT1b, cN0, pM1b) - Unsigned    Prior Therapy: 1. Right upper lobectomy (February 2012) for right lung cancer 2. SBRT (November 2015) for stage I left adenocarcinoma  Current Therapy:  Pembrolizumab     HISTORY OF PRESENT ILLNESS:   Oncology History  Adenocarcinoma of lung (HCC)  09/30/2010 Cancer Staging   CT of chest showed spiculated lesion   10/15/2010 Surgery   Right upper lobectomy by Dr. Brantley   10/16/2010  Remission     10/24/2013 Imaging   CT Chest-  Enlarging subpleural nodule in the left lower lobe, now 9 mm, with distortion of the overlying pleura. Finding is concerning for a small bronchogenic carcinoma   01/23/2014 Imaging   CT Chest- Left lower lobe nodule has enlarged from 11/01/2012 and is worrisome for primary bronchogenic carcinoma.    07/04/2014 - 07/10/2014 Radiation Therapy   SBRT by Dr. Izell in 3 fractions.   07/05/2015 Imaging   perifissural nodule in LLL appears slightly smaller with parenchymal opacity attrib to XRT, likely inflamm pathcy airspace dz more inferiorly in LLL, airspace dz in RLL resolved. no metastatic disease   Primary cancer of left lower lobe of lung (HCC)  06/18/2014 Initial Diagnosis   Left lower lobe nodule suspicious for primary lung carcinoma  07/04/2014 - 07/10/2014 Radiation Therapy   SBRT   10/12/2014 Imaging   No acute findings within the chest. Decrease in size of left lower lobe perifissural nodule compatible with response to therapy. No new or progressive disease identified within the chest.   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)  07/16/2022 Initial Diagnosis   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)   07/21/2022 -  Chemotherapy   Patient is on Treatment Plan : LUNG NSCLC Pembrolizumab  (200) q21d        INTERVAL HISTORY:   Larenz is a 78 y.o. male presenting to clinic today for follow up of bilateral lung cancers, normocytic anemia, and B12 deficiency.  Since his last visit, the patient fell onto his right side after tripping on a walnut.  Reports he did not seek medical help but is still having some right sided rib pain.  Reports chronic shortness of breath secondary to COPD.  Has vomiting at times.  No nausea.  Reports energy levels are 50% appetite is 100%.  He continues to take potassium supplements daily.  Denies any diarrhea.  Patient recently had a CT scan and is here to review results.  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 dense RCA calcification --Non-ischemic Myoview    History of kidney stones    in the 70's   History of radiation therapy 07/04/14, 07/06/14, 07/10/14   LLL lung nodule/54 Gy/3 fx- SBRT   Hypercholesterolemia 05/06/2011   Pneumonia due to Streptococcus    HX. POST OPERATIVELY   Port-A-Cath in place 07/21/2022   Pre-diabetes    Stress fx pelvis    DUE TO AA IN 1970    Surgical History: Past Surgical History:  Procedure Laterality Date   BACK SURGERY  07/04/2017   fusion above previous surgery   BACK SURGERY  2011   metal plate o-5,O4   BRONCHIAL BIOPSY  06/03/2022   Procedure: BRONCHIAL BIOPSIES;  Surgeon: Brenna Adine CROME, DO;  Location: [facility];  Service: Pulmonary;;   BRONCHIAL BRUSHINGS  06/03/2022   Procedure: BRONCHIAL BRUSHINGS;  Surgeon: Brenna Adine CROME, DO;  Location: [facility];  Service: Pulmonary;;   BRONCHIAL NEEDLE ASPIRATION BIOPSY  06/03/2022   Procedure: BRONCHIAL NEEDLE ASPIRATION BIOPSIES;  Surgeon: Brenna Adine CROME, DO;  Location: [facility];  Service: Pulmonary;;   Coroanry Calcium  Score  03/2017   1043. Noted especially in RCA. Dense calcification, recommend Myoview  over Cor CTA.   FIDUCIAL MARKER PLACEMENT  06/03/2022   Procedure: FIDUCIAL MARKER PLACEMENT;  Surgeon: Brenna Adine CROME, DO;  Location: [facility];  Service: Pulmonary;;   IR IMAGING GUIDED PORT INSERTION  08/04/2022   LIPOMA EXCISION N/A 10/14/2017    Procedure: EXCISION LIPOMA ON BACK;  Surgeon: Kallie Manuelita BROCKS, MD;  Location: [facility];  Service: General;  Laterality: N/A;   LIPOMA EXCISION Left 11/19/2018   Procedure:  MINOR EXCISION OF SEBACEOUS CYST NECK;  Surgeon: Kallie Manuelita BROCKS, MD;  Location: [facility];  Service: General;  Laterality: Left;  pt knows to arrive at 7:00   LUNG LOBECTOMY  2012   RT UPPER LOBE   NM MYOVIEW  LTD  04/2017   EF 60%. Hypertensive response to exercise. (6:21 min, 7.7 METs) No EKG changes. LOW RISK    Social History: Social History   Socioeconomic History   Marital status: Married    Spouse name: Not on file   Number of children: 3   Years of education: Not on file   Highest education level: Not on file  Occupational History   Not on file  Tobacco Use   Smoking status: Former    Current packs/day: 0.00    Average packs/day: 1.5 packs/day for 59.0 years (88.5 ttl pk-yrs)    Types: Cigarettes    Start date: 09/09/1950    Quit date: 09/08/2010    Years since quitting: 13.8   Smokeless tobacco: Current    Types: Chew   Tobacco comments:    still chews tobacco, he tells me he hasn't in the past 2 days.   Vaping Use   Vaping status: Never Used  Substance and Sexual Activity   Alcohol use: No   Drug use: No   Sexual activity: Not Currently  Other Topics Concern   Not on file  Social History Narrative   Not on file   Social Drivers of Health   Financial Resource Strain: Not on file  Food Insecurity: Not on file  Transportation Needs: No Transportation Needs (07/13/2018)   PRAPARE - Administrator, Civil Service (Medical): No    Lack of Transportation (Non-Medical): No  Physical Activity: Not on file  Stress: Not on file  Social Connections: Not on file  Intimate Partner Violence: Not At Risk (07/13/2018)   Humiliation, Afraid, Rape, and Kick questionnaire    Fear of Current or Ex-Partner: No    Emotionally Abused: No    Physically Abused: No    Sexually Abused: No    Family  History: Family History  Problem Relation Age of Onset   Hypertension Mother    Kidney disease Father    Heart attack Brother    Cancer Maternal Uncle        prostate cancer   Cancer Maternal Uncle        prostate cancer   Cancer Maternal Uncle        prostate cancer   Cancer Maternal Uncle        prostate cancer    Current Medications:  Current Outpatient Medications:    albuterol  (PROVENTIL  HFA;VENTOLIN  HFA) 108 (90 Base) MCG/ACT inhaler, Inhale 2 puffs into the lungs every 4 (four) hours as needed for wheezing or shortness of breath., Disp: 2 Inhaler, Rfl: 3   betamethasone  dipropionate 0.05 % cream, APPLY TOPICALLY TWICE A DAY, Disp: 30 g, Rfl: 1   cetirizine (ZYRTEC) 10 MG tablet, Take 10 mg by mouth daily., Disp: , Rfl:    cholecalciferol  (VITAMIN D3) 25 MCG (1000 UNIT) tablet, Take 1,000 Units by mouth daily., Disp: , Rfl:    cimetidine (TAGAMET) 200 MG tablet, Take 400-600 mg by mouth daily as needed (for heartburn)., Disp: , Rfl:    clobetasol ointment (TEMOVATE) 0.05 %, Apply 1 Application topically daily as needed (itching)., Disp: , Rfl:    DULoxetine (CYMBALTA) 30 MG capsule, Take 30 mg by mouth daily., Disp: , Rfl:  ferrous sulfate 325 (65 FE) MG tablet, Take 325 mg by mouth daily with breakfast., Disp: , Rfl:    fluticasone  (FLONASE ) 50 MCG/ACT nasal spray, Place 2 sprays into both nostrils daily as needed for rhinitis., Disp: , Rfl:    Glycopyrrolate-Formoterol (BEVESPI AEROSPHERE) 9-4.8 MCG/ACT AERO, Inhale 2 puffs into the lungs 2 (two) times daily., Disp: , Rfl:    guaiFENesin  (MUCINEX ) 600 MG 12 hr tablet, Take 600 mg by mouth daily., Disp: , Rfl:    HYDROcodone -acetaminophen  (NORCO) 10-325 MG tablet, Take 1-2 tablets by mouth every 4 (four) hours., Disp: , Rfl:    hydrOXYzine  (VISTARIL ) 25 MG capsule, Take 25-50 mg by mouth every 6 (six) hours as needed for itching., Disp: , Rfl:    KLOR-CON  M10 10 MEQ tablet, Take 20 mEq by mouth daily., Disp: , Rfl:     lidocaine -prilocaine  (EMLA ) cream, APPLY A SMALL AMOUNT TO PORT A CATH SITE AND COVER WITH PLASTIC WRAP 1 HOUR PRIOR TO INFUSION APPOINTMENTS, Disp: 30 g, Rfl: 3   naproxen sodium (ALEVE) 220 MG tablet, Take 220 mg by mouth daily as needed (pain)., Disp: , Rfl:    Omega-3 Fatty Acids (FISH OIL) 1000 MG CAPS, Take 1,000 mg by mouth daily., Disp: , Rfl:    pantoprazole  (PROTONIX ) 40 MG tablet, Take 40 mg by mouth See admin instructions. Take 40 mg daily, may take a second 40 mg dose as needed for heartburn, Disp: , Rfl:    PEMBROLIZUMAB  IV, Inject into the vein every 21 ( twenty-one) days., Disp: , Rfl:    polyethylene glycol powder (GLYCOLAX/MIRALAX) 17 GM/SCOOP powder, Take 17 g by mouth daily as needed for mild constipation or moderate constipation., Disp: , Rfl:    rosuvastatin  (CRESTOR ) 40 MG tablet, Take 40 mg by mouth at bedtime., Disp: , Rfl:    tamsulosin  (FLOMAX ) 0.4 MG CAPS capsule, Take 1 capsule (0.4 mg total) by mouth daily., Disp: 90 capsule, Rfl: 1   triamcinolone  ointment (KENALOG ) 0.1 %, Apply topically 2 (two) times daily as needed., Disp: , Rfl:    zolpidem (AMBIEN) 5 MG tablet, Take 5 mg by mouth daily., Disp: , Rfl:  No current facility-administered medications for this visit.  Facility-Administered Medications Ordered in Other Visits:    sodium chloride  flush (NS) 0.9 % injection 10 mL, 10 mL, Intracatheter, PRN, Katragadda, Sreedhar, MD, 10 mL at 10/07/22 1523   sodium chloride  flush (NS) 0.9 % injection 10 mL, 10 mL, Intracatheter, PRN, Katragadda, Sreedhar, MD, 10 mL at 10/28/22 1446   sodium chloride  flush (NS) 0.9 % injection 10 mL, 10 mL, Intracatheter, PRN, Katragadda, Sreedhar, MD, 10 mL at 11/19/22 1547   Allergies: Allergies  Allergen Reactions   Fentanyl  Other (See Comments)    CAUSED HALLUCINATIONS/05-03-13 pt reports it was oral fentanyl  that caused hallucinations    Gabapentin Other (See Comments)    Bones throbbed, headaches, weakness   Nabumetone      Unknown reaction    Amoxicillin  Rash   Aspirin Nausea Only   Ciprofloxacin Hcl Rash    REVIEW OF SYSTEMS:   Review of Systems  Constitutional:  Positive for fatigue.  Respiratory:  Positive for shortness of breath.   Gastrointestinal:  Positive for vomiting. Negative for abdominal pain, blood in stool, constipation and diarrhea.  Musculoskeletal:  Positive for gait problem.  Neurological:  Positive for gait problem.  Psychiatric/Behavioral:  Negative for depression and sleep disturbance. The patient is not nervous/anxious.      VITALS:   Blood pressure  124/70, pulse 96, temperature 98.4 F (36.9 C), temperature source Tympanic, resp. rate 18, height 5' 6 (1.676 m), weight 145 lb (65.8 kg), SpO2 100%.  Wt Readings from Last 3 Encounters:  07/12/24 145 lb (65.8 kg)  04/05/24 146 lb 13.2 oz (66.6 kg)  03/29/24 143 lb 1.3 oz (64.9 kg)    Body mass index is 23.4 kg/m.  Performance status (ECOG): 1 - Symptomatic but completely ambulatory  PHYSICAL EXAM:   Physical Exam Constitutional:      Appearance: Normal appearance.  Cardiovascular:     Rate and Rhythm: Normal rate and regular rhythm.  Pulmonary:     Effort: Pulmonary effort is normal.     Breath sounds: Normal breath sounds.  Abdominal:     General: Bowel sounds are normal.     Palpations: Abdomen is soft.  Musculoskeletal:        General: No swelling. Normal range of motion.  Neurological:     Mental Status: He is alert and oriented to person, place, and time. Mental status is at baseline.     LABS:   CBC     Component Value Date/Time   WBC 6.8 07/04/2024 0807   RBC 4.07 (L) 07/04/2024 0807   HGB 14.2 07/04/2024 0807   HGB 13.5 05/03/2023 1601   HCT 42.0 07/04/2024 0807   HCT 39.8 05/03/2023 1601   PLT 228 07/04/2024 0807   PLT 266 05/03/2023 1601   MCV 103.2 (H) 07/04/2024 0807   MCV 98 (H) 05/03/2023 1601   MCH 34.9 (H) 07/04/2024 0807   MCHC 33.8 07/04/2024 0807   RDW 12.1 07/04/2024 0807    RDW 11.6 05/03/2023 1601   LYMPHSABS 1.1 07/04/2024 0807   LYMPHSABS 0.5 (L) 05/03/2023 1601   MONOABS 0.5 07/04/2024 0807   EOSABS 0.2 07/04/2024 0807   EOSABS 0.1 05/03/2023 1601   BASOSABS 0.1 07/04/2024 0807   BASOSABS 0.1 05/03/2023 1601    CMP      Component Value Date/Time   NA 138 07/04/2024 0807   NA 136 05/03/2023 1601   K 3.1 (L) 07/04/2024 0807   CL 100 07/04/2024 0807   CO2 25 07/04/2024 0807   GLUCOSE 104 (H) 07/04/2024 0807   BUN 17 07/04/2024 0807   BUN 18 05/03/2023 1601   BUN 23.9 07/05/2015 1239   CREATININE 0.94 07/04/2024 0807   CREATININE 0.9 07/05/2015 1239   CALCIUM  8.7 (L) 07/04/2024 0807   PROT 6.4 (L) 07/04/2024 0807   PROT 6.5 05/03/2023 1601   ALBUMIN 4.1 07/04/2024 0807   ALBUMIN 4.3 05/03/2023 1601   AST 38 07/04/2024 0807   ALT 37 07/04/2024 0807   ALKPHOS 78 07/04/2024 0807   BILITOT 0.5 07/04/2024 0807   BILITOT 0.2 05/03/2023 1601   GFRNONAA >60 07/04/2024 0807   GFRAA >60 05/29/2020 1407     No results found for: CEA1, CEA / No results found for: CEA1, CEA No results found for: PSA1 No results found for: CAN199 No results found for: CAN125  No results found for: STEPHANY RINGS, A1GS, A2GS, BETS, BETA2SER, GAMS, MSPIKE, SPEI Lab Results  Component Value Date   TIBC 310 09/30/2022   TIBC 323 04/14/2022   TIBC 373 10/01/2021   FERRITIN 108 09/30/2022   FERRITIN 65 04/14/2022   FERRITIN 49 10/01/2021   IRONPCTSAT 13 (L) 09/30/2022   IRONPCTSAT 22 04/14/2022   IRONPCTSAT 27 10/01/2021   Lab Results  Component Value Date   LDH 120 06/28/2021   LDH 130 12/20/2020  LDH 97 (L) 07/12/2018     STUDIES:   CT CHEST W CONTRAST Result Date: 07/04/2024 CLINICAL DATA:  Restaging non-small cell lung cancer. * Tracking Code: BO * EXAM: CT CHEST WITH CONTRAST TECHNIQUE: Multidetector CT imaging of the chest was performed during intravenous contrast administration. RADIATION DOSE REDUCTION:  This exam was performed according to the departmental dose-optimization program which includes automated exposure control, adjustment of the mA and/or kV according to patient size and/or use of iterative reconstruction technique. CONTRAST:  75mL OMNIPAQUE  IOHEXOL  300 MG/ML  SOLN COMPARISON:  Numerous prior imaging studies. The most recent chest CT is 03/29/2024. The most recent PET-CT is 01/15/2023 FINDINGS: Cardiovascular: The heart is normal in size. No pericardial effusion. The aorta is normal in caliber. Stable advanced atherosclerotic calcifications. No dissection. Stable branch vessel calcifications including fairly extensive three-vessel coronary artery calcifications. The pulmonary arteries are normal. Mediastinum/Nodes: Small scattered mediastinal and hilar lymph nodes are stable. No mass or overt adenopathy the. Right IJ Port-A-Cath is stable. Stable large hiatal hernia. Lungs/Pleura: Status post right upper lobe lobectomy. Associated scarring changes. Surgical changes from right middle lobe wedge resection. Stable underlying advanced emphysematous changes and areas of pulmonary scarring. Stable dense area of dense scarring change in the left lower lobe adjacent to the major fissure. Stable lingular scarring changes. Fiducial again noted in the left upper lobe with some surrounding stranding interstitial change and scarring. No discrete mass or nodule. No new pulmonary lesions or nodules. No pleural effusion or pleural nodule. The central tracheobronchial tree is unremarkable. Upper Abdomen: No significant upper abdominal findings. No hepatic or adrenal metastatic disease. Stable simple left renal cysts. Stable vascular calcifications. Musculoskeletal: Stable inferior scapular sclerosis and cortical thickening, likely treated metastatic disease. Stable ununited seventh right posterior rib fracture. No new bone lesions. IMPRESSION: 1. Surgical and radiation changes again noted. No findings for recurrent  tumor, mediastinal/hilar adenopathy or pulmonary metastatic disease. 2. Stable left upper lobe fiducial with some surrounding stranding interstitial change and scarring. No discrete mass or nodule. 3. Stable advanced emphysematous changes and areas of pulmonary scarring. 4. Stable large hiatal hernia. 5. Stable advanced atherosclerotic calcifications including three-vessel coronary artery calcifications. 6. Stable inferior scapular sclerosis and cortical thickening, likely treated metastatic disease. Stable ununited seventh right posterior rib fracture. New/acute fracture involving the anterolateral aspect of the seventh rib. Aortic Atherosclerosis (ICD10-I70.0) and Emphysema (ICD10-J43.9). Electronically Signed   By: MYRTIS Stammer M.D.   On: 07/04/2024 10:26

## 2024-07-13 ENCOUNTER — Other Ambulatory Visit: Payer: Self-pay

## 2024-07-21 ENCOUNTER — Other Ambulatory Visit: Payer: Self-pay | Admitting: *Deleted

## 2024-07-21 DIAGNOSIS — Z95828 Presence of other vascular implants and grafts: Secondary | ICD-10-CM

## 2024-07-21 DIAGNOSIS — C3412 Malignant neoplasm of upper lobe, left bronchus or lung: Secondary | ICD-10-CM

## 2024-07-21 MED ORDER — LIDOCAINE-PRILOCAINE 2.5-2.5 % EX CREA: TOPICAL_CREAM | CUTANEOUS | 3 refills | Status: AC

## 2024-09-05 ENCOUNTER — Encounter: Payer: Self-pay | Admitting: *Deleted

## 2024-09-12 ENCOUNTER — Other Ambulatory Visit: Payer: Self-pay

## 2024-09-20 ENCOUNTER — Other Ambulatory Visit: Payer: Self-pay

## 2024-11-09 ENCOUNTER — Inpatient Hospital Stay: Attending: Physician Assistant

## 2024-11-09 ENCOUNTER — Other Ambulatory Visit (HOSPITAL_COMMUNITY)

## 2024-11-15 ENCOUNTER — Inpatient Hospital Stay: Admitting: Oncology

## 2024-11-16 ENCOUNTER — Inpatient Hospital Stay: Admitting: Oncology

## 2024-12-06 ENCOUNTER — Ambulatory Visit: Admitting: Cardiology
